# Patient Record
Sex: Male | Born: 1940 | Race: White | Hispanic: No | Marital: Single | Smoking: Never smoker
Health system: Southern US, Community
[De-identification: ages and names within clinical notes are randomized; demographics above are authoritative.]

## PROBLEM LIST (undated history)

## (undated) DIAGNOSIS — I10 Essential (primary) hypertension: Secondary | ICD-10-CM

## (undated) DIAGNOSIS — R7303 Prediabetes: Secondary | ICD-10-CM

## (undated) DIAGNOSIS — I499 Cardiac arrhythmia, unspecified: Secondary | ICD-10-CM

## (undated) DIAGNOSIS — I482 Chronic atrial fibrillation, unspecified: Secondary | ICD-10-CM

## (undated) DIAGNOSIS — J189 Pneumonia, unspecified organism: Secondary | ICD-10-CM

## (undated) DIAGNOSIS — Z7901 Long term (current) use of anticoagulants: Secondary | ICD-10-CM

## (undated) DIAGNOSIS — C61 Malignant neoplasm of prostate: Secondary | ICD-10-CM

## (undated) DIAGNOSIS — N184 Chronic kidney disease, stage 4 (severe): Secondary | ICD-10-CM

## (undated) DIAGNOSIS — R0602 Shortness of breath: Secondary | ICD-10-CM

## (undated) DIAGNOSIS — I509 Heart failure, unspecified: Secondary | ICD-10-CM

## (undated) DIAGNOSIS — E039 Hypothyroidism, unspecified: Secondary | ICD-10-CM

## (undated) DIAGNOSIS — M1612 Unilateral primary osteoarthritis, left hip: Secondary | ICD-10-CM

## (undated) DIAGNOSIS — I429 Cardiomyopathy, unspecified: Secondary | ICD-10-CM

## (undated) HISTORY — PX: PROSTATE SURGERY: SHX751

## (undated) HISTORY — DX: Long term (current) use of anticoagulants: Z79.01

## (undated) HISTORY — PX: PROSTATECTOMY: SHX69

## (undated) HISTORY — PX: CARDIOVERSION: SHX1299

## (undated) HISTORY — DX: Malignant neoplasm of prostate: C61

## (undated) HISTORY — DX: Essential (primary) hypertension: I10

## (undated) HISTORY — DX: Chronic atrial fibrillation, unspecified: I48.20

## (undated) HISTORY — DX: Cardiomyopathy, unspecified: I42.9

---

## 2000-04-30 ENCOUNTER — Encounter: Admission: RE | Admit: 2000-04-30 | Discharge: 2000-07-29 | Payer: Self-pay | Admitting: Radiation Oncology

## 2000-05-01 ENCOUNTER — Ambulatory Visit (HOSPITAL_COMMUNITY): Admission: RE | Admit: 2000-05-01 | Discharge: 2000-05-01 | Payer: Self-pay | Admitting: Internal Medicine

## 2005-03-11 ENCOUNTER — Emergency Department (HOSPITAL_COMMUNITY): Admission: EM | Admit: 2005-03-11 | Discharge: 2005-03-11 | Payer: Self-pay | Admitting: Emergency Medicine

## 2005-03-23 ENCOUNTER — Emergency Department (HOSPITAL_COMMUNITY): Admission: EM | Admit: 2005-03-23 | Discharge: 2005-03-23 | Payer: Self-pay | Admitting: Emergency Medicine

## 2009-02-03 ENCOUNTER — Encounter (INDEPENDENT_AMBULATORY_CARE_PROVIDER_SITE_OTHER): Payer: Self-pay | Admitting: Internal Medicine

## 2009-02-03 ENCOUNTER — Inpatient Hospital Stay (HOSPITAL_COMMUNITY): Admission: EM | Admit: 2009-02-03 | Discharge: 2009-02-08 | Payer: Self-pay | Admitting: Emergency Medicine

## 2009-02-03 ENCOUNTER — Ambulatory Visit: Payer: Self-pay | Admitting: Cardiology

## 2009-02-09 HISTORY — PX: NM MYOCAR PERF WALL MOTION: HXRAD629

## 2009-08-11 ENCOUNTER — Ambulatory Visit (HOSPITAL_COMMUNITY): Admission: RE | Admit: 2009-08-11 | Discharge: 2009-08-11 | Payer: Self-pay | Admitting: Cardiology

## 2009-08-11 ENCOUNTER — Encounter (INDEPENDENT_AMBULATORY_CARE_PROVIDER_SITE_OTHER): Payer: Self-pay | Admitting: Cardiology

## 2010-03-25 LAB — BASIC METABOLIC PANEL
BUN: 27 mg/dL — ABNORMAL HIGH (ref 6–23)
Calcium: 8.6 mg/dL (ref 8.4–10.5)
Creatinine, Ser: 1.88 mg/dL — ABNORMAL HIGH (ref 0.4–1.5)
GFR calc Af Amer: 43 mL/min — ABNORMAL LOW (ref >60)

## 2010-03-28 LAB — DIFFERENTIAL
Basophils Absolute: 0.1 10*3/uL (ref 0.0–0.1)
Basophils Relative: 1 % (ref 0–1)
Basophils Relative: 1 % (ref 0–1)
Basophils Relative: 2 % — ABNORMAL HIGH (ref 0–1)
Eosinophils Absolute: 0.1 10*3/uL (ref 0.0–0.7)
Eosinophils Absolute: 0.2 10*3/uL (ref 0.0–0.7)
Eosinophils Absolute: 0.3 10*3/uL (ref 0.0–0.7)
Eosinophils Relative: 4 % (ref 0–5)
Lymphocytes Relative: 25 % (ref 12–46)
Lymphocytes Relative: 40 % (ref 12–46)
Lymphs Abs: 2.1 10*3/uL (ref 0.7–4.0)
Lymphs Abs: 2.2 10*3/uL (ref 0.7–4.0)
Monocytes Absolute: 0.6 10*3/uL (ref 0.1–1.0)
Monocytes Absolute: 0.6 10*3/uL (ref 0.1–1.0)
Monocytes Relative: 11 % (ref 3–12)
Monocytes Relative: 9 % (ref 3–12)
Neutrophils Relative %: 44 % (ref 43–77)
Neutrophils Relative %: 46 % (ref 43–77)
Neutrophils Relative %: 63 % (ref 43–77)

## 2010-03-28 LAB — HEPARIN LEVEL (UNFRACTIONATED)
Heparin Unfractionated: 0.11 IU/mL — ABNORMAL LOW (ref 0.30–0.70)
Heparin Unfractionated: 0.54 IU/mL (ref 0.30–0.70)
Heparin Unfractionated: 1.5 IU/mL — ABNORMAL HIGH (ref 0.30–0.70)
Heparin Unfractionated: 1.52 IU/mL — ABNORMAL HIGH (ref 0.30–0.70)

## 2010-03-28 LAB — BASIC METABOLIC PANEL
BUN: 18 mg/dL (ref 6–23)
BUN: 18 mg/dL (ref 6–23)
BUN: 19 mg/dL (ref 6–23)
BUN: 19 mg/dL (ref 6–23)
CO2: 23 mEq/L (ref 19–32)
Calcium: 8.6 mg/dL (ref 8.4–10.5)
Calcium: 8.6 mg/dL (ref 8.4–10.5)
Chloride: 105 mEq/L (ref 96–112)
Chloride: 105 mEq/L (ref 96–112)
Chloride: 108 mEq/L (ref 96–112)
Creatinine, Ser: 1.87 mg/dL — ABNORMAL HIGH (ref 0.4–1.5)
Creatinine, Ser: 1.89 mg/dL — ABNORMAL HIGH (ref 0.4–1.5)
Creatinine, Ser: 2.13 mg/dL — ABNORMAL HIGH (ref 0.4–1.5)
GFR calc Af Amer: 38 mL/min — ABNORMAL LOW (ref >60)
GFR calc non Af Amer: 36 mL/min — ABNORMAL LOW (ref >60)
GFR calc non Af Amer: 36 mL/min — ABNORMAL LOW (ref >60)
Glucose, Bld: 98 mg/dL (ref 70–99)
Potassium: 4 mEq/L (ref 3.5–5.1)
Potassium: 4 mEq/L (ref 3.5–5.1)
Potassium: 4 mEq/L (ref 3.5–5.1)
Potassium: 4.3 mEq/L (ref 3.5–5.1)
Sodium: 137 mEq/L (ref 135–145)
Sodium: 140 mEq/L (ref 135–145)
Sodium: 140 mEq/L (ref 135–145)

## 2010-03-28 LAB — PROTEIN ELECTROPH W RFLX QUANT IMMUNOGLOBULINS
Albumin ELP: 52.6 % — ABNORMAL LOW (ref 55.8–66.1)
Alpha-1-Globulin: 5.5 % — ABNORMAL HIGH (ref 2.9–4.9)
Alpha-2-Globulin: 12.7 % — ABNORMAL HIGH (ref 7.1–11.8)
Beta 2: 6.7 % — ABNORMAL HIGH (ref 3.2–6.5)
Beta Globulin: 7 % (ref 4.7–7.2)

## 2010-03-28 LAB — UIFE/LIGHT CHAINS/TP QN, 24-HR UR
Albumin, U: DETECTED
Alpha 1, Urine: DETECTED — AB
Alpha 2, Urine: DETECTED — AB
Beta, Urine: DETECTED — AB
Free Lambda Lt Chains,Ur: 0.39 mg/dL (ref 0.08–1.01)
Total Protein, Urine: 6.3 mg/dL

## 2010-03-28 LAB — COMPREHENSIVE METABOLIC PANEL
ALT: 30 U/L (ref 0–53)
Albumin: 3.4 g/dL — ABNORMAL LOW (ref 3.5–5.2)
Alkaline Phosphatase: 49 U/L (ref 39–117)
Chloride: 107 mEq/L (ref 96–112)
Glucose, Bld: 92 mg/dL (ref 70–99)
Potassium: 4.6 mEq/L (ref 3.5–5.1)
Sodium: 139 mEq/L (ref 135–145)
Total Bilirubin: 0.7 mg/dL (ref 0.3–1.2)
Total Protein: 6.5 g/dL (ref 6.0–8.3)

## 2010-03-28 LAB — PHOSPHORUS: Phosphorus: 4.2 mg/dL (ref 2.3–4.6)

## 2010-03-28 LAB — CBC
HCT: 33.6 % — ABNORMAL LOW (ref 39.0–52.0)
HCT: 35.7 % — ABNORMAL LOW (ref 39.0–52.0)
Hemoglobin: 12 g/dL — ABNORMAL LOW (ref 13.0–17.0)
MCHC: 33.9 g/dL (ref 30.0–36.0)
MCV: 92.5 fL (ref 78.0–100.0)
MCV: 92.6 fL (ref 78.0–100.0)
Platelets: 209 10*3/uL (ref 150–400)
Platelets: 219 10*3/uL (ref 150–400)
RBC: 3.63 MIL/uL — ABNORMAL LOW (ref 4.22–5.81)
RDW: 13.8 % (ref 11.5–15.5)
RDW: 14 % (ref 11.5–15.5)
RDW: 14.1 % (ref 11.5–15.5)
WBC: 5.6 10*3/uL (ref 4.0–10.5)
WBC: 5.7 10*3/uL (ref 4.0–10.5)
WBC: 5.8 10*3/uL (ref 4.0–10.5)

## 2010-03-28 LAB — POCT CARDIAC MARKERS
CKMB, poc: 3.2 ng/mL (ref 1.0–8.0)
Myoglobin, poc: 206 ng/mL (ref 12–200)
Troponin i, poc: <0.05 ng/mL (ref 0.00–0.09)

## 2010-03-28 LAB — URINALYSIS, ROUTINE W REFLEX MICROSCOPIC
Bilirubin Urine: NEGATIVE
Glucose, UA: NEGATIVE mg/dL
Hgb urine dipstick: NEGATIVE
Urobilinogen, UA: 0.2 mg/dL (ref 0.0–1.0)

## 2010-03-28 LAB — PROTIME-INR
INR: 1.46 (ref 0.00–1.49)
Prothrombin Time: 14.4 seconds (ref 11.6–15.2)
Prothrombin Time: 26.8 seconds — ABNORMAL HIGH (ref 11.6–15.2)

## 2010-03-28 LAB — CARDIAC PANEL(CRET KIN+CKTOT+MB+TROPI)
Relative Index: 2.8 — ABNORMAL HIGH (ref 0.0–2.5)
Total CK: 123 U/L (ref 7–232)
Troponin I: 0.01 ng/mL (ref 0.00–0.06)

## 2010-03-28 LAB — MAGNESIUM
Magnesium: 1.9 mg/dL (ref 1.5–2.5)
Magnesium: 2 mg/dL (ref 1.5–2.5)

## 2010-03-28 LAB — HEPATIC FUNCTION PANEL
AST: 61 U/L — ABNORMAL HIGH (ref 0–37)
Bilirubin, Direct: 0.2 mg/dL (ref 0.0–0.3)
Total Bilirubin: 0.9 mg/dL (ref 0.3–1.2)

## 2010-03-28 LAB — APTT: aPTT: 30 seconds (ref 24–37)

## 2010-03-28 LAB — D-DIMER, QUANTITATIVE: D-Dimer, Quant: 1.51 ug/mL-FEU — ABNORMAL HIGH (ref 0.00–0.48)

## 2010-03-28 LAB — ALBUMIN: Albumin: 3.4 g/dL — ABNORMAL LOW (ref 3.5–5.2)

## 2010-03-28 LAB — IMMUNOFIXATION ELECTROPHORESIS: Total Protein ELP: 6.2 g/dL (ref 6.0–8.3)

## 2010-12-29 NOTE — H&P (Signed)
NTS SOAP Note  Vital Signs:  Vitals as of: [id number]: Systolic Q000111Q: Diastolic 94: Heart Rate 99: Temp 96.1F: Height 39ft 2.5in: Weight 246Lbs 0 Ounces: Pain Level 0: BMI 31  BMI : 31.16 kg/m2  Subjective: This 70 Years 70 Months old Male presents forscreening TCS.Denies any GI complaints.  Last had a TCS many years ago.  No family h/o colon carcinoma.  Review of Symptoms:  Constitutional:unremarkable Head:unremarkable Eyes:unremarkable Nose/Mouth/Throat:unremarkable Cardiovascular:unremarkable Respiratory:unremarkable Gastrointestinal:unremarkable Genitourinary:unremarkable Musculoskeletal:unremarkable Skin:unremarkable Hematolgic/Lymphatic:unremarkable Allergic/Immunologic:unremarkable   Past Medical History:Reviewed   Past Medical History  Surgical History: prostate 2002 Medical Problems:  Cancer-prostate, Heart problems, High Blood pressure, High cholesterol, hypothyroidism Allergies: nkda Medications: coumadin, synthroid, carvedilol, losartin, furosemide   Social History:Reviewed   Social History  Preferred Language: English ([place]) Race:  White Ethnicity: Not Hispanic / Latino Age: 70 Years 10 Months Marital Status:  S Alcohol:  No Recreational drug(s):  No   Smoking Status: Never smoker reviewed on 12/29/2010  Family History:Reviewed   Family History  Is there a family history [id number]    Objective Information: General:Well appearing, well nourished in no distress. Head:Atraumatic; no masses; no abnormalities Irregular rate and rhythym, no s3, s4, murmurs Abdomen:Soft, NT/ND, normal bowel sounds, no HSM, no masses.  No peritoneal signs. deferred to procedure  Assessment:Need for screening TCS  Diagnosis &amp; Procedure: DiagnosisCode: V76.51, ProcedureCodeRL:[id number],    Plan:Scheduled for TCS on 01/17/11.  Will stop Coumadin four days prior to procedure.   Patient  Education:Alternative treatments to surgery were discussed with patient (and family).Risks and benefits  of procedure were fully explained to the patient (and family) who gave informed consent. Patient/family questions were addressed.  Follow-up:Pending Surgery

## 2011-01-16 MED ORDER — SODIUM CHLORIDE 0.45 % IV SOLN
Freq: Once | INTRAVENOUS | Status: AC
  Administered 2011-01-17: 08:00:00 via INTRAVENOUS

## 2011-01-17 ENCOUNTER — Encounter (HOSPITAL_COMMUNITY)
Admission: RE | Disposition: A | Discharge status: Home / Self Care | Payer: Self-pay | Source: Ambulatory Visit | Attending: General Surgery

## 2011-01-17 ENCOUNTER — Ambulatory Visit (HOSPITAL_COMMUNITY)
Admission: RE | Admit: 2011-01-17 | Discharge: 2011-01-17 | Disposition: A | Discharge status: Home / Self Care | Payer: Medicare Other | Source: Ambulatory Visit | Attending: General Surgery | Admitting: General Surgery

## 2011-01-17 ENCOUNTER — Encounter (HOSPITAL_COMMUNITY): Payer: Self-pay

## 2011-01-17 DIAGNOSIS — Z7901 Long term (current) use of anticoagulants: Secondary | ICD-10-CM | POA: Insufficient documentation

## 2011-01-17 DIAGNOSIS — K5730 Diverticulosis of large intestine without perforation or abscess without bleeding: Secondary | ICD-10-CM | POA: Insufficient documentation

## 2011-01-17 DIAGNOSIS — Z1211 Encounter for screening for malignant neoplasm of colon: Secondary | ICD-10-CM | POA: Insufficient documentation

## 2011-01-17 DIAGNOSIS — I10 Essential (primary) hypertension: Secondary | ICD-10-CM | POA: Insufficient documentation

## 2011-01-17 DIAGNOSIS — E7800 Pure hypercholesterolemia, unspecified: Secondary | ICD-10-CM | POA: Insufficient documentation

## 2011-01-17 HISTORY — PX: COLONOSCOPY: SHX5424

## 2011-01-17 HISTORY — DX: Cardiac arrhythmia, unspecified: I49.9

## 2011-01-17 HISTORY — DX: Pneumonia, unspecified organism: J18.9

## 2011-01-17 SURGERY — COLONOSCOPY
Anesthesia: Moderate Sedation

## 2011-01-17 MED ORDER — MIDAZOLAM HCL 5 MG/5ML IJ SOLN
INTRAMUSCULAR | Status: DC | PRN
  Administered 2011-01-17 (×2): 2 mg via INTRAVENOUS

## 2011-01-17 MED ORDER — MIDAZOLAM HCL 5 MG/5ML IJ SOLN
INTRAMUSCULAR | Status: AC
  Filled 2011-01-17: qty 10

## 2011-01-17 MED ORDER — MEPERIDINE HCL 100 MG/ML IJ SOLN
INTRAMUSCULAR | Status: AC
  Filled 2011-01-17: qty 2

## 2011-01-17 MED ORDER — MEPERIDINE HCL 25 MG/ML IJ SOLN
INTRAMUSCULAR | Status: DC | PRN
  Administered 2011-01-17: 50 mg via INTRAVENOUS

## 2011-01-17 NOTE — Op Note (Signed)
[facility] [address] [place], [place]  [address]  COLONOSCOPY PROCEDURE REPORT  PATIENT:  [name], [name]  MR#:  [id number] BIRTHDATE:  Dec 06, 1940, 70 yrs. old  GENDER:  male ENDOSCOPIST:  Aviva Signs, MD REF. BY:  [name], M.D. PROCEDURE DATE:  01/17/2011 PROCEDURE:  Diagnostic Colonoscopy ASA CLASS:  Class II INDICATIONS:  Screening MEDICATIONS:   Versed 4 mg IV, demerol 50 mg IV  DESCRIPTION OF PROCEDURE:   After the risks benefits and alternatives of the procedure were thoroughly explained, informed consent was obtained.  Digital rectal exam was performed and revealed no abnormalities.   The [id number] endoscope was introduced through the anus and advanced to the cecum, which was identified by the ileocecal valve, without limitations.  The quality of the prep was adequate..  The instrument was then slowly withdrawn as the colon was fully examined.  FINDINGS:  Scattered diverticula were found in the descending colon.   Retroflexed views in the rectum revealed no abnormalities.   The scope was then withdrawn from the cecum and the procedure completed. 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  ______________________________ Aviva Signs, MD  CC:  [name], MD  n. Lorrin MaisAviva Signs at 01/17/2011 08:59 AM  [name] [name], [id number]

## 2011-01-23 ENCOUNTER — Encounter (HOSPITAL_COMMUNITY): Payer: Self-pay | Admitting: General Surgery

## 2011-04-06 ENCOUNTER — Encounter (HOSPITAL_COMMUNITY): Payer: Self-pay

## 2011-04-10 HISTORY — PX: TRANSTHORACIC ECHOCARDIOGRAM: SHX275

## 2011-04-11 NOTE — Patient Instructions (Signed)
[place]  04/11/2011   Your procedure is scheduled on:  04/17/2011  Report to [facility] at  800  AM.  Call this number if you have problems the morning of surgery: [phone]   Remember:   Do not eat food:After Midnight.  May have clear liquids:until Midnight .  Clear liquids include soda, tea, black coffee, apple or grape juice, broth.  Take these medicines the morning of surgery with A SIP OF WATER: coreg,synthroid,cozaar   Do not wear jewelry, make-up or nail polish.  Do not wear lotions, powders, or perfumes. You may wear deodorant.  Do not shave 48 hours prior to surgery.  Do not bring valuables to the hospital.  Contacts, dentures or bridgework may not be worn into surgery.  Leave suitcase in the car. After surgery it may be brought to your room.  For patients admitted to the hospital, checkout time is 11:00 AM the day of discharge.   Patients discharged the day of surgery will not be allowed to drive home.  Name and phone number of your driver: family  Special Instructions: N/A   Please read over the following fact sheets that you were given: Pain Booklet, Surgical Site Infection Prevention, Anesthesia Post-op Instructions and Care and Recovery After Surgery Cataract A cataract is a clouding of the lens of the eye. When a lens becomes cloudy, vision is reduced based on the degree and nature of the clouding. Many cataracts reduce vision to some degree. Some cataracts make people more near-sighted as they develop. Other cataracts increase glare. Cataracts that are ignored and become worse can sometimes look white. The white color can be seen through the pupil. CAUSES   Aging. However, cataracts may occur at any age, even in newborns.   Certain drugs.   Trauma to the eye.   Certain diseases such as diabetes.   Specific eye diseases such as chronic inflammation inside the eye or a sudden attack of a rare form of glaucoma.   Inherited or acquired medical problems.    SYMPTOMS   Gradual, progressive drop in vision in the affected eye.   Severe, rapid visual loss. This most often happens when trauma is the cause.  DIAGNOSIS  To detect a cataract, an eye doctor examines the lens. Cataracts are best diagnosed with an exam of the eyes with the pupils enlarged (dilated) by drops.  TREATMENT  For an early cataract, vision may improve by using different eyeglasses or stronger lighting. If that does not help your vision, surgery is the only effective treatment. A cataract needs to be surgically removed when vision loss interferes with your everyday activities, such as driving, reading, or watching TV. A cataract may also have to be removed if it prevents examination or treatment of another eye problem. Surgery removes the cloudy lens and usually replaces it with a substitute lens (intraocular lens, IOL).  At a time when both you and your doctor agree, the cataract will be surgically removed. If you have cataracts in both eyes, only one is usually removed at a time. This allows the operated eye to heal and be out of danger from any possible problems after surgery (such as infection or poor wound healing). In rare cases, a cataract may be doing damage to your eye. In these cases, your caregiver may advise surgical removal right away. The vast majority of people who have cataract surgery have better vision afterward. HOME CARE INSTRUCTIONS  If you are not planning surgery, you may be  asked to do the following:  Use different eyeglasses.   Use stronger or brighter lighting.   Ask your eye doctor about reducing your medicine dose or changing medicines if it is thought that a medicine caused your cataract. Changing medicines does not make the cataract go away on its own.   Become familiar with your surroundings. Poor vision can lead to injury. Avoid bumping into things on the affected side. You are at a higher risk for tripping or falling.   Exercise extreme care when  driving or operating machinery.   Wear sunglasses if you are sensitive to bright light or experiencing problems with glare.  SEEK IMMEDIATE MEDICAL CARE IF:   You have a worsening or sudden vision loss.   You notice redness, swelling, or increasing pain in the eye.   You have a fever.  Document Released: 12/26/2004 Document Revised: 12/15/2010 Document Reviewed: 08/19/2010 [facility] Patient Information 2012 [place].PATIENT INSTRUCTIONS POST-ANESTHESIA  IMMEDIATELY FOLLOWING SURGERY:  Do not drive or operate machinery for the first twenty four hours after surgery.  Do not make any important decisions for twenty four hours after surgery or while taking narcotic pain medications or sedatives.  If you develop intractable nausea and vomiting or a severe headache please notify your doctor immediately.  FOLLOW-UP:  Please make an appointment with your surgeon as instructed. You do not need to follow up with anesthesia unless specifically instructed to do so.  WOUND CARE INSTRUCTIONS (if applicable):  Keep a dry clean dressing on the anesthesia/puncture wound site if there is drainage.  Once the wound has quit draining you may leave it open to air.  Generally you should leave the bandage intact for twenty four hours unless there is drainage.  If the epidural site drains for more than 36-48 hours please call the anesthesia department.  QUESTIONS?:  Please feel free to call your physician or the hospital operator if you have any questions, and they will be happy to assist you.     [place] [phone]

## 2011-04-12 ENCOUNTER — Encounter (HOSPITAL_COMMUNITY): Payer: Self-pay

## 2011-04-12 ENCOUNTER — Encounter (HOSPITAL_COMMUNITY)
Admission: RE | Admit: 2011-04-12 | Discharge: 2011-04-12 | Disposition: A | Discharge status: Home / Self Care | Payer: Medicare Other | Source: Ambulatory Visit | Attending: Ophthalmology | Admitting: Ophthalmology

## 2011-04-12 HISTORY — DX: Hypothyroidism, unspecified: E03.9

## 2011-04-12 HISTORY — DX: Shortness of breath: R06.02

## 2011-04-12 LAB — BASIC METABOLIC PANEL
Chloride: 104 mEq/L (ref 96–112)
GFR calc Af Amer: 41 mL/min — ABNORMAL LOW (ref >90)
Potassium: 4.6 mEq/L (ref 3.5–5.1)
Sodium: 139 mEq/L (ref 135–145)

## 2011-04-12 LAB — HEMOGLOBIN AND HEMATOCRIT, BLOOD
HCT: 39.3 % (ref 39.0–52.0)
Hemoglobin: 13.6 g/dL (ref 13.0–17.0)

## 2011-04-14 MED ORDER — LIDOCAINE HCL (PF) 1 % IJ SOLN
INTRAMUSCULAR | Status: AC
  Filled 2011-04-14: qty 2

## 2011-04-14 MED ORDER — CYCLOPENTOLATE-PHENYLEPHRINE 0.2-1 % OP SOLN
OPHTHALMIC | Status: AC
  Filled 2011-04-14: qty 2

## 2011-04-14 MED ORDER — NEOMYCIN-POLYMYXIN-DEXAMETH 3.5-10000-0.1 OP OINT
TOPICAL_OINTMENT | OPHTHALMIC | Status: AC
  Filled 2011-04-14: qty 3.5

## 2011-04-14 MED ORDER — TETRACAINE HCL 0.5 % OP SOLN
OPHTHALMIC | Status: AC
  Filled 2011-04-14: qty 2

## 2011-04-14 MED ORDER — PHENYLEPHRINE HCL 2.5 % OP SOLN
OPHTHALMIC | Status: AC
  Filled 2011-04-14: qty 2

## 2011-04-14 MED ORDER — LIDOCAINE HCL 3.5 % OP GEL
OPHTHALMIC | Status: AC
  Filled 2011-04-14: qty 5

## 2011-04-17 ENCOUNTER — Ambulatory Visit (HOSPITAL_COMMUNITY): Payer: Medicare Other | Admitting: Anesthesiology

## 2011-04-17 ENCOUNTER — Encounter (HOSPITAL_COMMUNITY): Payer: Self-pay | Admitting: *Deleted

## 2011-04-17 ENCOUNTER — Encounter (HOSPITAL_COMMUNITY): Payer: Self-pay | Admitting: Anesthesiology

## 2011-04-17 ENCOUNTER — Encounter (HOSPITAL_COMMUNITY)
Admission: RE | Disposition: A | Discharge status: Home / Self Care | Payer: Self-pay | Source: Ambulatory Visit | Attending: Ophthalmology

## 2011-04-17 ENCOUNTER — Ambulatory Visit (HOSPITAL_COMMUNITY)
Admission: RE | Admit: 2011-04-17 | Discharge: 2011-04-17 | Disposition: A | Discharge status: Home / Self Care | Payer: Medicare Other | Source: Ambulatory Visit | Attending: Ophthalmology | Admitting: Ophthalmology

## 2011-04-17 DIAGNOSIS — E039 Hypothyroidism, unspecified: Secondary | ICD-10-CM | POA: Insufficient documentation

## 2011-04-17 DIAGNOSIS — Z01810 Encounter for preprocedural cardiovascular examination: Secondary | ICD-10-CM | POA: Insufficient documentation

## 2011-04-17 DIAGNOSIS — H2589 Other age-related cataract: Secondary | ICD-10-CM | POA: Insufficient documentation

## 2011-04-17 DIAGNOSIS — I10 Essential (primary) hypertension: Secondary | ICD-10-CM | POA: Insufficient documentation

## 2011-04-17 DIAGNOSIS — Z01812 Encounter for preprocedural laboratory examination: Secondary | ICD-10-CM | POA: Insufficient documentation

## 2011-04-17 HISTORY — PX: CATARACT EXTRACTION W/PHACO: SHX586

## 2011-04-17 SURGERY — PHACOEMULSIFICATION, CATARACT, WITH IOL INSERTION
Anesthesia: Monitor Anesthesia Care | Site: Eye | Laterality: Left | Wound class: Clean

## 2011-04-17 MED ORDER — ONDANSETRON HCL 4 MG/2ML IJ SOLN: 4.0000 mg | Freq: Once | INTRAMUSCULAR | Status: DC | PRN

## 2011-04-17 MED ORDER — MIDAZOLAM HCL 2 MG/2ML IJ SOLN
1.0000 mg | INTRAMUSCULAR | Status: DC | PRN
  Administered 2011-04-17: 2 mg via INTRAVENOUS

## 2011-04-17 MED ORDER — LIDOCAINE 3.5 % OP GEL OPTIME - NO CHARGE
OPHTHALMIC | Status: DC | PRN
  Administered 2011-04-17: 1 [drp] via OPHTHALMIC

## 2011-04-17 MED ORDER — BSS IO SOLN
INTRAOCULAR | Status: DC | PRN
  Administered 2011-04-17: 15 mL via INTRAOCULAR

## 2011-04-17 MED ORDER — MIDAZOLAM HCL 2 MG/2ML IJ SOLN
INTRAMUSCULAR | Status: AC
  Administered 2011-04-17: 2 mg via INTRAVENOUS
  Filled 2011-04-17: qty 2

## 2011-04-17 MED ORDER — POVIDONE-IODINE 5 % OP SOLN
OPHTHALMIC | Status: DC | PRN
  Administered 2011-04-17: 1 via OPHTHALMIC

## 2011-04-17 MED ORDER — LIDOCAINE HCL (PF) 1 % IJ SOLN
INTRAMUSCULAR | Status: DC | PRN
  Administered 2011-04-17: .4 mL

## 2011-04-17 MED ORDER — CYCLOPENTOLATE-PHENYLEPHRINE 0.2-1 % OP SOLN
1.0000 [drp] | OPHTHALMIC | Status: AC
  Administered 2011-04-17 (×3): 1 [drp] via OPHTHALMIC

## 2011-04-17 MED ORDER — LACTATED RINGERS IV SOLN
INTRAVENOUS | Status: DC
  Administered 2011-04-17: 1000 mL via INTRAVENOUS

## 2011-04-17 MED ORDER — LIDOCAINE HCL 3.5 % OP GEL
1.0000 "application " | Freq: Once | OPHTHALMIC | Status: AC
  Administered 2011-04-17: 1 via OPHTHALMIC

## 2011-04-17 MED ORDER — FENTANYL CITRATE 0.05 MG/ML IJ SOLN: 25.0000 ug | INTRAMUSCULAR | Status: DC | PRN

## 2011-04-17 MED ORDER — PROVISC 10 MG/ML IO SOLN
INTRAOCULAR | Status: DC | PRN
  Administered 2011-04-17: 8.5 mg via INTRAOCULAR

## 2011-04-17 MED ORDER — EPINEPHRINE HCL 1 MG/ML IJ SOLN
INTRAOCULAR | Status: DC | PRN
  Administered 2011-04-17: 10:00:00

## 2011-04-17 MED ORDER — TETRACAINE HCL 0.5 % OP SOLN
1.0000 [drp] | OPHTHALMIC | Status: AC
  Administered 2011-04-17 (×3): 1 [drp] via OPHTHALMIC

## 2011-04-17 MED ORDER — EPINEPHRINE HCL 1 MG/ML IJ SOLN
INTRAMUSCULAR | Status: AC
  Filled 2011-04-17: qty 1

## 2011-04-17 MED ORDER — PHENYLEPHRINE HCL 2.5 % OP SOLN
1.0000 [drp] | OPHTHALMIC | Status: AC
  Administered 2011-04-17 (×3): 1 [drp] via OPHTHALMIC

## 2011-04-17 SURGICAL SUPPLY — 31 items
CAPSULAR TENSION RING-AMO (OPHTHALMIC RELATED) IMPLANT
CLOTH BEACON ORANGE TIMEOUT ST (SAFETY) ×1 IMPLANT
EYE SHIELD UNIVERSAL CLEAR (GAUZE/BANDAGES/DRESSINGS) ×2 IMPLANT
GLOVE BIO SURGEON STRL SZ 6.5 (GLOVE) IMPLANT
GLOVE BIOGEL PI IND STRL 6.5 (GLOVE) IMPLANT
GLOVE BIOGEL PI IND STRL 7.0 (GLOVE) ×1 IMPLANT
GLOVE BIOGEL PI IND STRL 7.5 (GLOVE) IMPLANT
GLOVE BIOGEL PI INDICATOR 6.5 (GLOVE)
GLOVE BIOGEL PI INDICATOR 7.0 (GLOVE) ×1
GLOVE BIOGEL PI INDICATOR 7.5 (GLOVE)
GLOVE ECLIPSE 6.5 STRL STRAW (GLOVE) IMPLANT
GLOVE ECLIPSE 7.0 STRL STRAW (GLOVE) IMPLANT
GLOVE ECLIPSE 7.5 STRL STRAW (GLOVE) IMPLANT
GLOVE EXAM NITRILE LRG STRL (GLOVE) IMPLANT
GLOVE EXAM NITRILE MD LF STRL (GLOVE) ×1 IMPLANT
GLOVE SKINSENSE NS SZ6.5 (GLOVE)
GLOVE SKINSENSE NS SZ7.0 (GLOVE)
GLOVE SKINSENSE STRL SZ6.5 (GLOVE) IMPLANT
GLOVE SKINSENSE STRL SZ7.0 (GLOVE) IMPLANT
KIT VITRECTOMY (OPHTHALMIC RELATED) IMPLANT
PAD ARMBOARD 7.5X6 YLW CONV (MISCELLANEOUS) ×2 IMPLANT
PROC W NO LENS (INTRAOCULAR LENS)
PROC W SPEC LENS (INTRAOCULAR LENS)
PROCESS W NO LENS (INTRAOCULAR LENS) IMPLANT
PROCESS W SPEC LENS (INTRAOCULAR LENS) IMPLANT
RING MALYGIN (MISCELLANEOUS) IMPLANT
SIGHTPATH CAT PROC W REG LENS (Ophthalmic Related) ×2 IMPLANT
SYR TB 1ML LL NO SAFETY (SYRINGE) ×2 IMPLANT
TAPE CLOTH SOFT 2X10 (GAUZE/BANDAGES/DRESSINGS) ×2 IMPLANT
VISCOELASTIC ADDITIONAL (OPHTHALMIC RELATED) IMPLANT
WATER STERILE IRR 250ML POUR (IV SOLUTION) ×2 IMPLANT

## 2011-04-17 NOTE — Anesthesia Preprocedure Evaluation (Signed)
Anesthesia Evaluation  Patient identified by MRN, date of birth, ID band Patient awake    Reviewed: Allergy & Precautions, H&P , NPO status , Patient's Chart, lab work & pertinent test results, reviewed documented beta blocker date and time   Airway Mallampati: III      Dental  (+) Edentulous Upper and Edentulous Lower   Pulmonary shortness of breath, pneumonia ,  breath sounds clear to auscultation        Cardiovascular hypertension, + CAD + dysrhythmias Atrial Fibrillation Rhythm:Irregular Rate:Normal     Neuro/Psych    GI/Hepatic   Endo/Other  Hypothyroidism   Renal/GU      Musculoskeletal   Abdominal   Peds  Hematology   Anesthesia Other Findings   Reproductive/Obstetrics                           Anesthesia Physical Anesthesia Plan  ASA: III  Anesthesia Plan: MAC   Post-op Pain Management:    Induction: Intravenous  Airway Management Planned: Nasal Cannula  Additional Equipment:   Intra-op Plan:   Post-operative Plan:   Informed Consent: I have reviewed the patients History and Physical, chart, labs and discussed the procedure including the risks, benefits and alternatives for the proposed anesthesia with the patient or authorized representative who has indicated his/her understanding and acceptance.     Plan Discussed with:   Anesthesia Plan Comments:         Anesthesia Quick Evaluation

## 2011-04-17 NOTE — H&P (Signed)
I have reviewed the H&P, the patient was re-examined, and I have identified no interval changes in medical condition and plan of care since the history and physical of record  

## 2011-04-17 NOTE — Discharge Instructions (Signed)
[name] [name]  04/17/2011     Instructions  1. Use medications as Instructed.  Shake well before use. Wait 5 minutes between drops.  {OPHTHALMIC ANTIBIOTICS:22167} 4 times a day x 1 week.  {OPHTHALMIC ANTI-INFLAMMATORY:22168} 2 times a day x 4 weeks.  {OPHTHALMIC STEROID:22169} 4 times a day - week 1   3 times a day - Week 2, 2 times a day- Week 3, 1 time a day - Week 4.  2. Do not rub the operative eye. Do not swim underwater for 2 weeks.  3. You may remove the clear shield and resume your normal activities the day after  Surgery. Your eyes may feel more comfortable if you wear dark glasses outside.  4. Call our office at [phone] if you have sudden change in vision, extreme redness or pain. Some fluctuation in vision is normal after surgery. If you have an emergency after hours, call Dr. Geoffry Paradise at [phone].  5. It is important that you attend all of your follow-up appointments.        Follow-up:{follow up:32580} with [name], MD.   Dr. Loran Senters: [phone]  Dr. Iona HansenJI:[id number]  Dr. Geoffry ParadiseID:[id number]   If you find that you cannot contact your physician, but feel that your signs and   Symptoms warrant a physician's attention, call the Emergency Room at   [phone] ext.[phone].   Other{NA AND VA:[id number].

## 2011-04-17 NOTE — Brief Op Note (Signed)
Pre-Op Dx: Cataract OS Post-Op Dx: Cataract OS Surgeon: [name] Anesthesia: Topical with MAC Implant: Lenstec, Model Softec HD Specimen: None Complications: None

## 2011-04-17 NOTE — Transfer of Care (Signed)
Immediate Anesthesia Transfer of Care Note  Patient: [name] [name]  Procedure(s) Performed: Procedure(s) (LRB): CATARACT EXTRACTION PHACO AND INTRAOCULAR LENS PLACEMENT (IOC) (Left)  Patient Location: PACU and Short Stay  Anesthesia Type: MAC  Level of Consciousness: awake, alert , oriented and patient cooperative  Airway & Oxygen Therapy: Patient Spontanous Breathing  Post-op Assessment: Report given to PACU RN, Post -op Vital signs reviewed and stable and Patient moving all extremities  Post vital signs: Reviewed and stable  Complications: No apparent anesthesia complications

## 2011-04-17 NOTE — Op Note (Signed)
NAME:  [name] [name], BARQUIN NO.:  [id number]  MEDICAL RECORD NO.:  [id number]  LOCATION:  APPO                          FACILITY:  [facility]  PHYSICIAN:  Richardo Hanks, MD       DATE OF BIRTH:  [birth year]  DATE OF PROCEDURE:  04/17/2011 DATE OF DISCHARGE:  04/17/2011                              OPERATIVE REPORT   PREOPERATIVE DIAGNOSIS:  Combined cataract, left eye, diagnosis code 366.19.  POSTOPERATIVE DIAGNOSIS:  Combined cataract, left eye, diagnosis code 366.19.  SURGEON:  Franky Macho. Griselle Rufer, MD  ANESTHESIA:  Topical with monitored anesthesia care.  DESCRIPTION OF THE OPERATION:  In the preoperative holding area, dilating drop and viscous lidocaine were placed into the left eye.  The patient was then brought to the operating room where he was prepped and draped.  Beginning with a #75 blade, a paracentesis port was made at the surgeon's 2 o'clock position.  The anterior chamber was filled with a 1% nonpreserved lidocaine solution.  Because of poor visualization of the red reflex, the anterior chamber was filled with VisionBlue and the VisionBlue was then rinsed from the anterior chamber with balanced salt solution.  The anterior chamber was then filled with Provisc.  A 2.4-mm keratome blade was then used to make a clear corneal incision at the temporal limbus.  A bent cystotome needle was used to create a continuous tear capsulotomy.  Hydrodissection was performed with balanced salt solution and a fine cannula.  The lens nucleus was then removed using phacoemulsification and a quadrant cracking technique. Residual cortex was removed with irrigation and aspiration.  A capsular bag and anterior chamber were refilled with Provisc and a posterior chamber intraocular lens was placed into the capsular bag without difficulty using its lens injecting system.  The Provisc was then removed from the capsular bag and anterior chamber with irrigation and aspiration.  Stromal  hydration of the main incision and paracentesis ports was performed with balanced salt solution and a fine cannula.  The wounds were tested for leak, which were negative.  The patient tolerated the procedure well.  There were no operative complications and he was returned to the recovery area in satisfactory condition.  No surgical specimens.  Prosthetic device used is a Lenstec posterior chamber lens, model Softec HD, power of 13.5, serial number is [id number].          ______________________________ Richardo Hanks, MD     KEH/MEDQ  D:  04/17/2011  T:  04/17/2011  Job:  [id number]

## 2011-04-17 NOTE — Anesthesia Postprocedure Evaluation (Signed)
  Anesthesia Post-op Note  Patient: [name] [name]  Procedure(s) Performed: Procedure(s) (LRB): CATARACT EXTRACTION PHACO AND INTRAOCULAR LENS PLACEMENT (IOC) (Left)  Patient Location: PACU and Short Stay  Anesthesia Type: MAC  Level of Consciousness: awake, alert , oriented and patient cooperative  Airway and Oxygen Therapy: Patient Spontanous Breathing  Post-op Pain: none  Post-op Assessment: Post-op Vital signs reviewed, Patient's Cardiovascular Status Stable, Respiratory Function Stable, Patent Airway and No signs of Nausea or vomiting  Post-op Vital Signs: Reviewed and stable  Complications: No apparent anesthesia complications

## 2011-04-18 MED ORDER — NEOMYCIN-POLYMYXIN-DEXAMETH 0.1 % OP OINT
TOPICAL_OINTMENT | OPHTHALMIC | Status: AC | PRN
  Administered 2011-04-18: 1 via OPHTHALMIC

## 2011-04-19 ENCOUNTER — Encounter (HOSPITAL_COMMUNITY): Payer: Self-pay | Admitting: Ophthalmology

## 2011-05-10 HISTORY — PX: OTHER SURGICAL HISTORY: SHX169

## 2011-05-17 ENCOUNTER — Encounter (HOSPITAL_COMMUNITY): Payer: Self-pay | Admitting: Pharmacy Technician

## 2011-05-18 ENCOUNTER — Encounter (HOSPITAL_COMMUNITY): Payer: Self-pay

## 2011-05-18 ENCOUNTER — Encounter (HOSPITAL_COMMUNITY)
Admission: RE | Admit: 2011-05-18 | Discharge: 2011-05-18 | Payer: Medicare Other | Source: Ambulatory Visit | Admitting: Ophthalmology

## 2011-05-19 MED ORDER — NEOMYCIN-POLYMYXIN-DEXAMETH 3.5-10000-0.1 OP OINT
TOPICAL_OINTMENT | OPHTHALMIC | Status: AC
  Filled 2011-05-19: qty 3.5

## 2011-05-19 MED ORDER — LIDOCAINE HCL 3.5 % OP GEL
OPHTHALMIC | Status: AC
  Filled 2011-05-19: qty 5

## 2011-05-19 MED ORDER — PHENYLEPHRINE HCL 2.5 % OP SOLN
OPHTHALMIC | Status: AC
  Filled 2011-05-19: qty 2

## 2011-05-19 MED ORDER — LIDOCAINE HCL (PF) 1 % IJ SOLN
INTRAMUSCULAR | Status: AC
  Filled 2011-05-19: qty 2

## 2011-05-19 MED ORDER — CYCLOPENTOLATE-PHENYLEPHRINE 0.2-1 % OP SOLN
OPHTHALMIC | Status: AC
  Filled 2011-05-19: qty 2

## 2011-05-19 MED ORDER — TETRACAINE HCL 0.5 % OP SOLN
OPHTHALMIC | Status: AC
  Filled 2011-05-19: qty 2

## 2011-05-22 ENCOUNTER — Encounter (HOSPITAL_COMMUNITY): Payer: Self-pay | Admitting: Anesthesiology

## 2011-05-22 ENCOUNTER — Encounter (HOSPITAL_COMMUNITY): Payer: Self-pay | Admitting: *Deleted

## 2011-05-22 ENCOUNTER — Ambulatory Visit (HOSPITAL_COMMUNITY)
Admission: RE | Admit: 2011-05-22 | Discharge: 2011-05-22 | Disposition: A | Discharge status: Home / Self Care | Payer: Medicare Other | Source: Ambulatory Visit | Attending: Ophthalmology | Admitting: Ophthalmology

## 2011-05-22 ENCOUNTER — Ambulatory Visit (HOSPITAL_COMMUNITY): Payer: Medicare Other | Admitting: Anesthesiology

## 2011-05-22 ENCOUNTER — Encounter (HOSPITAL_COMMUNITY)
Admission: RE | Disposition: A | Discharge status: Home / Self Care | Payer: Self-pay | Source: Ambulatory Visit | Attending: Ophthalmology

## 2011-05-22 DIAGNOSIS — I4891 Unspecified atrial fibrillation: Secondary | ICD-10-CM | POA: Insufficient documentation

## 2011-05-22 DIAGNOSIS — H2589 Other age-related cataract: Secondary | ICD-10-CM | POA: Insufficient documentation

## 2011-05-22 DIAGNOSIS — I10 Essential (primary) hypertension: Secondary | ICD-10-CM | POA: Insufficient documentation

## 2011-05-22 DIAGNOSIS — Z7901 Long term (current) use of anticoagulants: Secondary | ICD-10-CM | POA: Insufficient documentation

## 2011-05-22 DIAGNOSIS — Z79899 Other long term (current) drug therapy: Secondary | ICD-10-CM | POA: Insufficient documentation

## 2011-05-22 HISTORY — PX: CATARACT EXTRACTION W/PHACO: SHX586

## 2011-05-22 SURGERY — PHACOEMULSIFICATION, CATARACT, WITH IOL INSERTION
Anesthesia: Monitor Anesthesia Care | Site: Eye | Laterality: Right | Wound class: Clean

## 2011-05-22 MED ORDER — TETRACAINE HCL 0.5 % OP SOLN
OPHTHALMIC | Status: AC
  Filled 2011-05-22: qty 2

## 2011-05-22 MED ORDER — LIDOCAINE HCL 3.5 % OP GEL
1.0000 "application " | Freq: Once | OPHTHALMIC | Status: AC
  Administered 2011-05-22: 1 via OPHTHALMIC

## 2011-05-22 MED ORDER — PHENYLEPHRINE HCL 2.5 % OP SOLN
OPHTHALMIC | Status: AC
  Filled 2011-05-22: qty 2

## 2011-05-22 MED ORDER — LACTATED RINGERS IV SOLN
INTRAVENOUS | Status: DC
  Administered 2011-05-22: 07:00:00 via INTRAVENOUS

## 2011-05-22 MED ORDER — PROVISC 10 MG/ML IO SOLN
INTRAOCULAR | Status: DC | PRN
  Administered 2011-05-22: 8.5 mg via INTRAOCULAR

## 2011-05-22 MED ORDER — CYCLOPENTOLATE-PHENYLEPHRINE 0.2-1 % OP SOLN
OPHTHALMIC | Status: AC
  Filled 2011-05-22: qty 2

## 2011-05-22 MED ORDER — MIDAZOLAM HCL 2 MG/2ML IJ SOLN
INTRAMUSCULAR | Status: AC
  Filled 2011-05-22: qty 2

## 2011-05-22 MED ORDER — EPINEPHRINE HCL 1 MG/ML IJ SOLN
INTRAMUSCULAR | Status: AC
  Filled 2011-05-22: qty 1

## 2011-05-22 MED ORDER — BSS IO SOLN
INTRAOCULAR | Status: DC | PRN
  Administered 2011-05-22: 15 mL via INTRAOCULAR

## 2011-05-22 MED ORDER — POVIDONE-IODINE 5 % OP SOLN
OPHTHALMIC | Status: DC | PRN
  Administered 2011-05-22: 1 via OPHTHALMIC

## 2011-05-22 MED ORDER — TETRACAINE HCL 0.5 % OP SOLN
1.0000 [drp] | OPHTHALMIC | Status: AC
  Administered 2011-05-22 (×3): 1 [drp] via OPHTHALMIC

## 2011-05-22 MED ORDER — FLURBIPROFEN SODIUM 0.03 % OP SOLN
OPHTHALMIC | Status: AC
  Filled 2011-05-22: qty 2.5

## 2011-05-22 MED ORDER — LIDOCAINE 3.5 % OP GEL OPTIME - NO CHARGE
OPHTHALMIC | Status: DC | PRN
  Administered 2011-05-22: 2 [drp] via OPHTHALMIC

## 2011-05-22 MED ORDER — NEOMYCIN-POLYMYXIN-DEXAMETH 0.1 % OP OINT
TOPICAL_OINTMENT | OPHTHALMIC | Status: DC | PRN
  Administered 2011-05-22: 1 via OPHTHALMIC

## 2011-05-22 MED ORDER — MIDAZOLAM HCL 2 MG/2ML IJ SOLN
1.0000 mg | INTRAMUSCULAR | Status: DC | PRN
  Administered 2011-05-22: 2 mg via INTRAVENOUS

## 2011-05-22 MED ORDER — EPINEPHRINE HCL 1 MG/ML IJ SOLN
INTRAOCULAR | Status: DC | PRN
  Administered 2011-05-22: 08:00:00

## 2011-05-22 MED ORDER — PHENYLEPHRINE HCL 2.5 % OP SOLN
1.0000 [drp] | OPHTHALMIC | Status: AC
  Administered 2011-05-22 (×3): 1 [drp] via OPHTHALMIC

## 2011-05-22 MED ORDER — LIDOCAINE HCL (PF) 1 % IJ SOLN
INTRAMUSCULAR | Status: DC | PRN
  Administered 2011-05-22: .5 mL

## 2011-05-22 MED ORDER — CYCLOPENTOLATE-PHENYLEPHRINE 0.2-1 % OP SOLN
1.0000 [drp] | OPHTHALMIC | Status: AC
  Administered 2011-05-22 (×3): 1 [drp] via OPHTHALMIC

## 2011-05-22 SURGICAL SUPPLY — 32 items
CAPSULAR TENSION RING-AMO (OPHTHALMIC RELATED) IMPLANT
CLOTH BEACON ORANGE TIMEOUT ST (SAFETY) ×2 IMPLANT
EYE SHIELD UNIVERSAL CLEAR (GAUZE/BANDAGES/DRESSINGS) ×2 IMPLANT
GLOVE BIO SURGEON STRL SZ 6.5 (GLOVE) IMPLANT
GLOVE BIOGEL PI IND STRL 6.5 (GLOVE) IMPLANT
GLOVE BIOGEL PI IND STRL 7.0 (GLOVE) IMPLANT
GLOVE BIOGEL PI IND STRL 7.5 (GLOVE) IMPLANT
GLOVE BIOGEL PI INDICATOR 6.5 (GLOVE)
GLOVE BIOGEL PI INDICATOR 7.0 (GLOVE) ×1
GLOVE BIOGEL PI INDICATOR 7.5 (GLOVE)
GLOVE ECLIPSE 6.5 STRL STRAW (GLOVE) IMPLANT
GLOVE ECLIPSE 7.0 STRL STRAW (GLOVE) IMPLANT
GLOVE ECLIPSE 7.5 STRL STRAW (GLOVE) IMPLANT
GLOVE EXAM NITRILE LRG STRL (GLOVE) IMPLANT
GLOVE EXAM NITRILE MD LF STRL (GLOVE) ×2 IMPLANT
GLOVE SKINSENSE NS SZ6.5 (GLOVE)
GLOVE SKINSENSE NS SZ7.0 (GLOVE)
GLOVE SKINSENSE STRL SZ6.5 (GLOVE) IMPLANT
GLOVE SKINSENSE STRL SZ7.0 (GLOVE) IMPLANT
KIT VITRECTOMY (OPHTHALMIC RELATED) IMPLANT
PAD ARMBOARD 7.5X6 YLW CONV (MISCELLANEOUS) ×2 IMPLANT
PROC W NO LENS (INTRAOCULAR LENS)
PROC W SPEC LENS (INTRAOCULAR LENS)
PROCESS W NO LENS (INTRAOCULAR LENS) IMPLANT
PROCESS W SPEC LENS (INTRAOCULAR LENS) IMPLANT
RING MALYGIN (MISCELLANEOUS) IMPLANT
SIGHTPATH CAT PROC W REG LENS (Ophthalmic Related) ×2 IMPLANT
SYR TB 1ML LL NO SAFETY (SYRINGE) IMPLANT
TAPE SURG TRANSPORE 1 IN (GAUZE/BANDAGES/DRESSINGS) ×1 IMPLANT
TAPE SURGICAL TRANSPORE 1 IN (GAUZE/BANDAGES/DRESSINGS) ×1
VISCOELASTIC ADDITIONAL (OPHTHALMIC RELATED) IMPLANT
WATER STERILE IRR 250ML POUR (IV SOLUTION) ×1 IMPLANT

## 2011-05-22 NOTE — Discharge Instructions (Signed)
[name] [name]  05/22/2011     Instructions  1. Use medications as Instructed.  Shake well before use. Wait 5 minutes between drops.  {OPHTHALMIC ANTIBIOTICS:22167} 4 times a day x 1 week.  {OPHTHALMIC ANTI-INFLAMMATORY:22168} 2 times a day x 4 weeks.  {OPHTHALMIC STEROID:22169} 4 times a day - week 1   3 times a day - Week 2, 2 times a day- Week 3, 1 time a day - Week 4.  2. Do not rub the operative eye. Do not swim underwater for 2 weeks.  3. You may remove the clear shield and resume your normal activities the day after  Surgery. Your eyes may feel more comfortable if you wear dark glasses outside.  4. Call our office at [phone] if you have sudden change in vision, extreme redness or pain. Some fluctuation in vision is normal after surgery. If you have an emergency after hours, call Dr. Geoffry Paradise at [phone].  5. It is important that you attend all of your follow-up appointments.        Follow-up:{follow up:32580} with [name], MD.   Dr. Loran Senters: [phone]  Dr. Iona HansenQN:[id number]  Dr. Geoffry ParadiseBB:[id number]   If you find that you cannot contact your physician, but feel that your signs and   Symptoms warrant a physician's attention, call the Emergency Room at   [phone].   Other{NA AND XH:[id number].

## 2011-05-22 NOTE — Op Note (Signed)
NAME:  [name] [name], [name] [name] NO.:  [id number]  MEDICAL RECORD NO.:  [id number]  LOCATION:  APPO                          FACILITY:  [facility]  PHYSICIAN:  Richardo Hanks, MD       DATE OF BIRTH:  16-Apr-1940  DATE OF PROCEDURE:  05/22/2011 DATE OF DISCHARGE:  05/22/2011                              OPERATIVE REPORT   PREOPERATIVE DIAGNOSIS:  Combined cataract, right eye, diagnosis code 366.19.  POSTOPERATIVE DIAGNOSIS:  Combined cataract, right eye, diagnosis code 366.19.  OPERATION PERFORMED:  Phacoemulsification with posterior chamber intraocular lens implantation, right eye.  SURGEON:  Franky Macho. Sherral Dirocco, MD  ANESTHESIA:  Topical with MAC.  OPERATIVE SUMMARY:  In the preoperative area, dilating drops were placed into the right eye.  The patient was then brought into the operating room where he was placed under general anesthesia.  The eye was then prepped and draped.  Beginning with a 75 blade, a paracentesis port was made at the surgeon's 2 o'clock position.  The anterior chamber was then filled with a 1% nonpreserved lidocaine solution with epinephrine.  This was followed by Viscoat to deepen the chamber.  A small fornix-based peritomy was performed superiorly.  Next, a single iris hook was placed through the limbus superiorly.  A 2.4-mm keratome blade was then used to make a clear corneal incision over the iris hook.  A bent cystotome needle and Utrata forceps were used to create a continuous tear capsulotomy.  Hydrodissection was performed using balanced salt solution on a fine cannula.  The lens nucleus was then removed using phacoemulsification in a quadrant cracking technique.  The cortical material was then removed with irrigation and aspiration.  The capsular bag and anterior chamber were refilled with Provisc.  The wound was widened to approximately 3 mm and a posterior chamber intraocular lens was placed into the capsular bag without difficulty using an  [employer] lens injecting system.  A single 10-0 nylon suture was then used to close the incision as well as stromal hydration.  The Provisc was removed from the anterior chamber and capsular bag with irrigation and aspiration.  At this point, the wounds were tested for leak, which were negative.  The anterior chamber remained deep and stable.  The patient tolerated the procedure well.  There were no operative complications, and he awoke from general anesthesia without problem.  No surgical specimens.  Prosthetic device used is a Lenstec posterior chamber lens, model Softec HD, power of 14.0, serial number is [id number].          ______________________________ Richardo Hanks, MD     KEH/MEDQ  D:  05/22/2011  T:  05/22/2011  Job:  [id number]

## 2011-05-22 NOTE — Anesthesia Preprocedure Evaluation (Signed)
Anesthesia Evaluation  Patient identified by MRN, date of birth, ID band Patient awake    Reviewed: Allergy & Precautions, H&P , NPO status , Patient's Chart, lab work & pertinent test results, reviewed documented beta blocker date and time   Airway Mallampati: III      Dental  (+) Edentulous Upper and Edentulous Lower   Pulmonary shortness of breath, pneumonia ,  breath sounds clear to auscultation        Cardiovascular hypertension, + CAD + dysrhythmias Atrial Fibrillation Rhythm:Irregular Rate:Normal     Neuro/Psych    GI/Hepatic   Endo/Other  Hypothyroidism   Renal/GU      Musculoskeletal   Abdominal   Peds  Hematology   Anesthesia Other Findings   Reproductive/Obstetrics                           Anesthesia Physical Anesthesia Plan  ASA: III  Anesthesia Plan: MAC   Post-op Pain Management:    Induction: Intravenous  Airway Management Planned: Nasal Cannula  Additional Equipment:   Intra-op Plan:   Post-operative Plan:   Informed Consent: I have reviewed the patients History and Physical, chart, labs and discussed the procedure including the risks, benefits and alternatives for the proposed anesthesia with the patient or authorized representative who has indicated his/her understanding and acceptance.     Plan Discussed with:   Anesthesia Plan Comments:         Anesthesia Quick Evaluation

## 2011-05-22 NOTE — Anesthesia Postprocedure Evaluation (Signed)
  Anesthesia Post-op Note  Patient: [name] [name]  Procedure(s) Performed: Procedure(s) (LRB): CATARACT EXTRACTION PHACO AND INTRAOCULAR LENS PLACEMENT (IOC) (Right)  Patient Location: PACU and Short Stay  Anesthesia Type: MAC  Level of Consciousness: awake, alert , oriented and patient cooperative  Airway and Oxygen Therapy: Patient Spontanous Breathing  Post-op Pain: none  Post-op Assessment: Post-op Vital signs reviewed, Patient's Cardiovascular Status Stable, Respiratory Function Stable, Patent Airway, No signs of Nausea or vomiting and Pain level controlled  Post-op Vital Signs: Reviewed and stable  Complications: No apparent anesthesia complications

## 2011-05-22 NOTE — H&P (Signed)
I have reviewed the H&P, the patient was re-examined, and I have identified no interval changes in medical condition and plan of care since the history and physical of record  

## 2011-05-22 NOTE — Brief Op Note (Signed)
Pre-Op Dx: Cataract OD Post-Op Dx: Cataract OD Surgeon: Raziya Aveni Anesthesia: Topical with MAC Surgery: Cataract Extraction with Intraocular lens Implant OD Implant: Lenstec, Model Softec HD Blood Loss: None Specimen: None Complications: None 

## 2011-05-22 NOTE — Transfer of Care (Signed)
Immediate Anesthesia Transfer of Care Note  Patient: [name] [name]  Procedure(s) Performed: Procedure(s) (LRB): CATARACT EXTRACTION PHACO AND INTRAOCULAR LENS PLACEMENT (IOC) (Right)  Patient Location: PACU and Short Stay  Anesthesia Type: MAC  Level of Consciousness: awake, alert , oriented and patient cooperative  Airway & Oxygen Therapy: Patient Spontanous Breathing  Post-op Assessment: Report given to PACU RN, Post -op Vital signs reviewed and stable and Patient moving all extremities  Post vital signs: Reviewed and stable  Complications: No apparent anesthesia complications

## 2011-05-24 ENCOUNTER — Encounter (HOSPITAL_COMMUNITY): Payer: Self-pay | Admitting: Ophthalmology

## 2012-03-15 ENCOUNTER — Ambulatory Visit: Payer: Self-pay | Admitting: Cardiovascular Disease

## 2012-03-15 DIAGNOSIS — I4891 Unspecified atrial fibrillation: Secondary | ICD-10-CM | POA: Insufficient documentation

## 2012-03-15 DIAGNOSIS — Z7901 Long term (current) use of anticoagulants: Secondary | ICD-10-CM | POA: Insufficient documentation

## 2012-06-05 ENCOUNTER — Ambulatory Visit (INDEPENDENT_AMBULATORY_CARE_PROVIDER_SITE_OTHER): Payer: Medicare Other | Admitting: Pharmacist Clinician (PhC)/ Clinical Pharmacy Specialist

## 2012-06-05 DIAGNOSIS — Z7901 Long term (current) use of anticoagulants: Secondary | ICD-10-CM

## 2012-06-05 DIAGNOSIS — I4891 Unspecified atrial fibrillation: Secondary | ICD-10-CM

## 2012-06-05 LAB — POCT INR: INR: 3

## 2012-07-11 ENCOUNTER — Other Ambulatory Visit (HOSPITAL_COMMUNITY): Payer: Self-pay | Admitting: Family Medicine

## 2012-07-11 DIAGNOSIS — Z139 Encounter for screening, unspecified: Secondary | ICD-10-CM

## 2012-07-11 DIAGNOSIS — R0989 Other specified symptoms and signs involving the circulatory and respiratory systems: Secondary | ICD-10-CM

## 2012-07-16 ENCOUNTER — Ambulatory Visit (HOSPITAL_COMMUNITY)
Admission: RE | Admit: 2012-07-16 | Discharge: 2012-07-16 | Disposition: A | Discharge status: Home / Self Care | Payer: Medicare Other | Source: Ambulatory Visit | Attending: Family Medicine | Admitting: Family Medicine

## 2012-07-16 ENCOUNTER — Other Ambulatory Visit (HOSPITAL_COMMUNITY): Payer: Self-pay | Admitting: Family Medicine

## 2012-07-16 DIAGNOSIS — R0989 Other specified symptoms and signs involving the circulatory and respiratory systems: Secondary | ICD-10-CM | POA: Insufficient documentation

## 2012-07-16 DIAGNOSIS — Z139 Encounter for screening, unspecified: Secondary | ICD-10-CM

## 2012-07-16 DIAGNOSIS — I10 Essential (primary) hypertension: Secondary | ICD-10-CM | POA: Insufficient documentation

## 2012-07-16 DIAGNOSIS — R109 Unspecified abdominal pain: Secondary | ICD-10-CM | POA: Insufficient documentation

## 2012-07-22 ENCOUNTER — Other Ambulatory Visit: Payer: Self-pay

## 2012-07-22 MED ORDER — FUROSEMIDE 20 MG PO TABS: 20.0000 mg | ORAL_TABLET | Freq: Every morning | ORAL | Status: DC

## 2012-07-22 NOTE — Telephone Encounter (Signed)
Rx was sent to pharmacy electronically. 

## 2012-09-23 ENCOUNTER — Telehealth: Payer: Self-pay | Admitting: Pharmacist Clinician (PhC)/ Clinical Pharmacy Specialist

## 2012-09-23 NOTE — Telephone Encounter (Signed)
Overdue INR letter sent

## 2013-01-30 ENCOUNTER — Telehealth: Payer: Self-pay | Admitting: Pharmacist Clinician (PhC)/ Clinical Pharmacy Specialist

## 2013-01-30 NOTE — Telephone Encounter (Signed)
LMOM for pt to call back - who is monitoring Coumadin/INR.

## 2013-01-31 ENCOUNTER — Ambulatory Visit: Payer: Self-pay | Admitting: Pharmacist Clinician (PhC)/ Clinical Pharmacy Specialist

## 2013-01-31 DIAGNOSIS — I4891 Unspecified atrial fibrillation: Secondary | ICD-10-CM

## 2013-01-31 DIAGNOSIS — Z7901 Long term (current) use of anticoagulants: Secondary | ICD-10-CM

## 2013-02-06 ENCOUNTER — Encounter: Payer: Self-pay | Admitting: *Deleted

## 2013-02-11 ENCOUNTER — Encounter: Payer: Self-pay | Admitting: Cardiovascular Disease

## 2013-02-11 ENCOUNTER — Ambulatory Visit (INDEPENDENT_AMBULATORY_CARE_PROVIDER_SITE_OTHER): Payer: Medicare HMO | Admitting: Cardiovascular Disease

## 2013-02-11 VITALS — BP 118/80 | HR 101 | Ht 74.5 in | Wt 267.8 lb

## 2013-02-11 DIAGNOSIS — I10 Essential (primary) hypertension: Secondary | ICD-10-CM

## 2013-02-11 DIAGNOSIS — N183 Chronic kidney disease, stage 3 unspecified: Secondary | ICD-10-CM | POA: Insufficient documentation

## 2013-02-11 DIAGNOSIS — G478 Other sleep disorders: Secondary | ICD-10-CM

## 2013-02-11 DIAGNOSIS — I4891 Unspecified atrial fibrillation: Secondary | ICD-10-CM

## 2013-02-11 MED ORDER — CARVEDILOL 25 MG PO TABS: 37.5000 mg | ORAL_TABLET | Freq: Two times a day (BID) | ORAL | Status: DC

## 2013-02-11 NOTE — Patient Instructions (Signed)
Your physician has recommended you make the following change in your medication: Stop losartan and increase carvedilol to 37.5 mg twice daily Your physician recommends that you schedule a follow-up appointment in: 12 months

## 2013-02-11 NOTE — Progress Notes (Signed)
Patient ID: [name] [name], male   DOB: 1940-07-31, 73 y.o.   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 Most recently he has had normal left ventricular systolic function. Significant comorbidities include hypertension and moderate chronic kidney disease with a creatinine that generally hovers around 1.8-2.1(followed by DrLowanda Foster). He is on warfarin anticoagulation and has not had significant bleeding problems. His INR is reported as being very steady. He does not have a history of stroke/TIA or other embolic events. He has moderate obesity but has actually lost a couple of pounds since last year. In fact, this is the first time he has not gained weight from year to year and a long time.  He is completely asymptomatic. His heart rate at rest is around 100 beats per minute today.   No Known Allergies  Current Outpatient Prescriptions  Medication Sig Dispense Refill  . carvedilol (COREG) 25 MG tablet Take 1.5 tablets (37.5 mg total) by mouth 2 (two) times daily with a meal.  270 tablet  3  . cholecalciferol (VITAMIN D) 1000 UNITS tablet Take 1,000 Units by mouth every morning.       . furosemide (LASIX) 20 MG tablet Take 1 tablet (20 mg total) by mouth every morning.  30 tablet  6  . levothyroxine (SYNTHROID, LEVOTHROID) 50 MCG tablet Take 50 mcg by mouth every morning.       . warfarin (COUMADIN) 5 MG tablet Take 5 mg by mouth every evening.        No current facility-administered medications for this visit.   Facility-Administered Medications Ordered in Other Visits  Medication Dose Route Frequency Provider Last Rate Last Dose  . neomycin-polymyxin-dexameth (MAXITROL) 0.1 % ophth ointment    PRN [name], MD   1 application at 123456  [id number]    Past Medical History  Diagnosis Date  . Pneumonia   . Dysrhythmia   . Shortness of breath   . Prostate cancer   . Hypothyroidism   . AF (atrial fibrillation)   . Cardiomyopathy     h/o  . Chronic kidney disease, stage 3   . Systemic hypertension     Past Surgical History  Procedure Laterality Date  . Cardioversion    . Prostate surgery      7 years ago [facility] Dr Javaid-prostatectomy  . Colonoscopy  01/17/2011    Procedure: COLONOSCOPY;  Surgeon: Jamesetta So;  Location: [facility];  Service: Gastroenterology;  Laterality: N/A;  . Cataract extraction w/phaco  04/17/2011    Procedure: CATARACT EXTRACTION PHACO AND INTRAOCULAR LENS PLACEMENT (IOC);  Surgeon: [name], MD;  Location: [facility];  Service: Ophthalmology;  Laterality: Left;  CDE:14.58  . Cataract extraction w/phaco  05/22/2011    Procedure: CATARACT EXTRACTION PHACO AND INTRAOCULAR LENS PLACEMENT (IOC);  Surgeon: [name], MD;  Location: [facility];  Service: Ophthalmology;  Laterality: Right;  CDE 13.02  . Sleep study  05/2011    increased upper airway resistance syndrome with mild sleep apnea during REM  . Transthoracic echocardiogram  04/2011    EF=50-55%; RV mildly dilated; LA mild-mod dilated; mild mitral valve annular calcif, mild MR; mod TR, RVSP elevated 30-81mmHg, mild pulm HTN; AV mildly sclerotic, mild calcif of AV leaflets; trace pulm valve regurg   . Nm  myocar perf wall motion  02/2009    dipyridamole; mild perfusion defect due to infarct/scar with mild perinfarct ischemia is apical septal, apical basal inferoseptal, basal inferior, mid inferoseptal mid inferior & apical inferior; EKG negative for ischemia; low risk scan    Family History  Problem Relation Age of Onset  . Anesthesia problems Neg Hx   . Hypotension Neg Hx   . Malignant hyperthermia Neg Hx   . Pseudochol deficiency Neg Hx   . Diabetes Mother   . Prostate cancer Father     History   Social History  . Marital Status: Single     Spouse Name: N/A    Number of Children: 2  . Years of Education: N/A   Occupational History  . Not on file.   Social History Main Topics  . Smoking status: Never Smoker   . Smokeless tobacco: Not on file  . Alcohol Use: No  . Drug Use: No  . Sexual Activity: Yes    Birth Control/ Protection: None   Other Topics Concern  . Not on file   Social History Narrative  . No narrative on file    Review of systems: The patient specifically denies any chest pain at rest or with exertion, dyspnea at rest or with exertion, orthopnea, paroxysmal nocturnal dyspnea, syncope, palpitations, focal neurological deficits, intermittent claudication, lower extremity edema, unexplained weight gain, cough, hemoptysis or wheezing.  The patient also denies abdominal pain, nausea, vomiting, dysphagia, diarrhea, constipation, polyuria, polydipsia, dysuria, hematuria, frequency, urgency, abnormal bleeding or bruising, fever, chills, unexpected weight changes, mood swings, change in skin or hair texture, change in voice quality, auditory or visual problems, allergic reactions or rashes, new musculoskeletal complaints other than usual "aches and pains".   PHYSICAL EXAM BP 118/80  Pulse 101  Ht 6' 2.5" (1.892 m)  Wt 121.473 kg (267 lb 12.8 oz)  BMI 33.93 kg/m2  General: Alert, oriented x3, no distress Head: no evidence of trauma, PERRL, EOMI, no exophtalmos or lid lag, no myxedema, no xanthelasma; normal ears, nose and oropharynx Neck: normal jugular venous pulsations and no hepatojugular reflux; brisk carotid pulses without delay and no carotid bruits Chest: clear to auscultation, no signs of consolidation by percussion or palpation, normal fremitus, symmetrical and full respiratory excursions Cardiovascular: normal position and quality of the apical impulse, irregular rhythm, normal first and second heart sounds, no murmurs, rubs or gallops Abdomen: no tenderness or distention, no masses by palpation, no  abnormal pulsatility or arterial bruits, normal bowel sounds, no hepatosplenomegaly Extremities: no clubbing, cyanosis or edema; 2+ radial, ulnar and brachial pulses bilaterally; 2+ right femoral, posterior tibial and dorsalis pedis pulses; 2+ left femoral, posterior tibial and dorsalis pedis pulses; no subclavian or femoral bruits Neurological: grossly nonfocal   EKG: Atrial fibrillation with rapid ventricular response, chronic left anterior fascicular block, no ischemic repolarization abnormalities  Lipid Panel     Component Value Date/Time   CHOL  Value: 142        ATP III CLASSIFICATION:  <200     mg/dL   Desirable  200-239  mg/dL   Borderline High  >=240    mg/dL   High        02/04/2009 0538   TRIG 122 02/04/2009 0538   HDL 33* 02/04/2009 0538   CHOLHDL 4.3 02/04/2009 0538   VLDL 24 02/04/2009 0538   LDLCALC  Value: 85        Total Cholesterol/HDL:CHD Risk Coronary Heart Disease Risk Table  Men   Women  1/2 Average Risk   3.4   3.3  Average Risk       5.0   4.4  2 X Average Risk   9.6   7.1  3 X Average Risk  23.4   11.0        Use the calculated Patient Ratio above and the CHD Risk Table to determine the patient's CHD Risk.        ATP III CLASSIFICATION (LDL):  <100     mg/dL   Optimal  100-129  mg/dL   Near or Above                    Optimal  130-159  mg/dL   Borderline  160-189  mg/dL   High  >190     mg/dL   Very High 02/04/2009 0538    BMET    Component Value Date/Time   NA 139 04/12/2011 1550   K 4.6 04/12/2011 1550   CL 104 04/12/2011 1550   CO2 24 04/12/2011 1550   GLUCOSE 93 04/12/2011 1550   BUN 34* 04/12/2011 1550   CREATININE 1.82* 04/12/2011 1550   CALCIUM 9.5 04/12/2011 1550   GFRNONAA 36* 04/12/2011 1550   GFRAA 41* 04/12/2011 1550     ASSESSMENT AND PLAN  I am pleased with the fact that he is asymptomatic in that he is tolerating full dose anticoagulation without significant problems, but I am a little concerned about his rapid ventricular rate at rest, especially  known that he has previously had serious complications of tachycardia related cardiomyopathy. His blood pressure is excellent. I advised him to increase the carvedilol to 37.5 mg twice a day to improve rate control and he will stop taking valsartan to avoid developing hypotension. I do not think that the angiotensin receptor blocker is critical for his renal problems since he does not have diabetes mellitus.  Orders Placed This Encounter  Procedures  . EKG 12-Lead     [name], MD, [facility] [phone] office [phone] pager

## 2013-02-16 ENCOUNTER — Other Ambulatory Visit: Payer: Self-pay | Admitting: Cardiovascular Disease

## 2013-02-17 NOTE — Telephone Encounter (Signed)
Rx was sent to pharmacy electronically. 

## 2013-02-18 ENCOUNTER — Other Ambulatory Visit: Payer: Self-pay | Admitting: *Deleted

## 2014-02-15 ENCOUNTER — Other Ambulatory Visit: Payer: Self-pay | Admitting: Cardiovascular Disease

## 2014-03-02 ENCOUNTER — Ambulatory Visit (INDEPENDENT_AMBULATORY_CARE_PROVIDER_SITE_OTHER): Payer: PPO | Admitting: Cardiovascular Disease

## 2014-03-02 ENCOUNTER — Encounter: Payer: Self-pay | Admitting: Cardiovascular Disease

## 2014-03-02 VITALS — BP 138/90 | HR 95 | Resp 16 | Ht 74.5 in | Wt 271.3 lb

## 2014-03-02 DIAGNOSIS — Z7901 Long term (current) use of anticoagulants: Secondary | ICD-10-CM

## 2014-03-02 DIAGNOSIS — I4891 Unspecified atrial fibrillation: Secondary | ICD-10-CM

## 2014-03-02 DIAGNOSIS — I10 Essential (primary) hypertension: Secondary | ICD-10-CM

## 2014-03-02 MED ORDER — CARVEDILOL 25 MG PO TABS: 50.0000 mg | ORAL_TABLET | Freq: Two times a day (BID) | ORAL | Status: DC

## 2014-03-02 NOTE — Patient Instructions (Signed)
INCREASE Carvedilol to 25mg  - take 2 tablets twice a day.  Dr. Sallyanne Kuster recommends that you schedule a follow-up appointment in: One year.

## 2014-03-02 NOTE — Progress Notes (Signed)
Patient ID: KAAN CASINI, male   DOB: [birth year], 74 y.o.   MRN: [id number]     Reason for office visit Permanent atrial fibrillation, history of tachycardia related cardiomyopathy  Mr. 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 Most recently he has had normal left ventricular systolic function. Significant comorbidities include hypertension and moderate chronic kidney disease with a creatinine that generally hovers around 1.8-2.1(followed by DrLowanda Foster). He is on warfarin anticoagulation followed in Dr. Cornelia Copa office and INR is reported as being very steady. He does not have a history of stroke/TIA or other embolic events, and has not had significant bleeding problems. His. He has moderate obesity and a previous sleep study showed upper airway resistance syndrome without frank obstructive sleep apnea. [place] and does not feel in any way limited compared with last year.  No Known Allergies  Current Outpatient Prescriptions  Medication Sig Dispense Refill  . cholecalciferol (VITAMIN D) 1000 UNITS tablet Take 1,000 Units by mouth every morning.     . furosemide (LASIX) 20 MG tablet TAKE ONE TABLET BY MOUTH ONCE DAILY IN THE MORNING 90 tablet 0  . levothyroxine (SYNTHROID, LEVOTHROID) 50 MCG tablet Take 50 mcg by mouth every morning.     . warfarin (COUMADIN) 4 MG tablet Take 4 mg by mouth daily. Alternate 4mg  with 2mg     . carvedilol (COREG) 25 MG tablet Take 2 tablets (50 mg total) by mouth 2 (two) times daily. 180 tablet 3   No current facility-administered medications for this visit.   Facility-Administered Medications Ordered in Other Visits  Medication Dose Route Frequency Provider Last Rate Last Dose  . neomycin-polymyxin-dexameth (MAXITROL) 0.1 % ophth ointment    PRN [name], MD   1 application at 123456  [id number]    Past Medical History  Diagnosis Date  . Pneumonia   . Dysrhythmia   . Shortness of breath   . Prostate cancer   . Hypothyroidism   . AF (atrial fibrillation)   . Cardiomyopathy     h/o  . Chronic kidney disease, stage 3   . Systemic hypertension     Past Surgical History  Procedure Laterality Date  . Cardioversion    . Prostate surgery      7 years ago [facility] Dr Javaid-prostatectomy  . Colonoscopy  01/17/2011    Procedure: COLONOSCOPY;  Surgeon: Jamesetta So;  Location: [facility];  Service: Gastroenterology;  Laterality: N/A;  . Cataract extraction w/phaco  04/17/2011    Procedure: CATARACT EXTRACTION PHACO AND INTRAOCULAR LENS PLACEMENT (IOC);  Surgeon: [name], MD;  Location: [facility];  Service: Ophthalmology;  Laterality: Left;  CDE:14.58  . Cataract extraction w/phaco  05/22/2011    Procedure: CATARACT EXTRACTION PHACO AND INTRAOCULAR LENS PLACEMENT (IOC);  Surgeon: [name], MD;  Location: [facility];  Service: Ophthalmology;  Laterality: Right;  CDE 13.02  . Sleep study  05/2011    increased upper airway resistance syndrome with mild sleep apnea during REM  . Transthoracic echocardiogram  04/2011    EF=50-55%; RV mildly dilated; LA mild-mod dilated; mild mitral valve annular calcif, mild MR; mod TR, RVSP elevated 30-67mmHg, mild pulm HTN; AV mildly sclerotic, mild calcif of AV leaflets; trace pulm valve regurg   . Nm myocar perf wall motion  02/2009    dipyridamole; mild perfusion defect due to infarct/scar with mild perinfarct  ischemia is apical septal, apical basal inferoseptal, basal inferior, mid inferoseptal mid inferior & apical inferior; EKG negative for ischemia; low risk scan    Family History  Problem Relation Age of Onset  . Anesthesia problems Neg Hx   . Hypotension Neg Hx   . Malignant hyperthermia Neg Hx   . Pseudochol deficiency Neg Hx   . Diabetes Mother   . Prostate cancer Father     History   Social History  . Marital Status: Single     Spouse Name: N/A  . Number of Children: 2  . Years of Education: N/A   Occupational History  . Not on file.   Social History Main Topics  . Smoking status: Never Smoker   . Smokeless tobacco: Not on file  . Alcohol Use: No  . Drug Use: No  . Sexual Activity: Yes    Birth Control/ Protection: None   Other Topics Concern  . Not on file   Social History Narrative    Review of systems: The patient specifically denies any chest pain at rest or with exertion, dyspnea at rest or with exertion, orthopnea, paroxysmal nocturnal dyspnea, syncope, palpitations, focal neurological deficits, intermittent claudication, lower extremity edema, unexplained weight gain, cough, hemoptysis or wheezing.  The patient also denies abdominal pain, nausea, vomiting, dysphagia, diarrhea, constipation, polyuria, polydipsia, dysuria, hematuria, frequency, urgency, abnormal bleeding or bruising, fever, chills, unexpected weight changes, mood swings, change in skin or hair texture, change in voice quality, auditory or visual problems, allergic reactions or rashes, new musculoskeletal complaints other than usual "aches and pains".   PHYSICAL EXAM BP 138/90 mmHg  Pulse 95  Resp 16  Ht 6' 2.5" (1.892 m)  Wt 123.061 kg (271 lb 4.8 oz)  BMI 34.38 kg/m2  General: Alert, oriented x3, no distress Head: no evidence of trauma, PERRL, EOMI, no exophtalmos or lid lag, no myxedema, no xanthelasma; normal ears, nose and oropharynx Neck: normal jugular venous pulsations and no hepatojugular reflux; brisk carotid pulses without delay and no carotid bruits Chest: clear to auscultation, no signs of consolidation by percussion or palpation, normal fremitus, symmetrical and full respiratory excursions Cardiovascular: normal position and quality of the apical impulse, irregular rhythm, normal first and second heart sounds, no murmurs, rubs or gallops Abdomen: no tenderness or distention, no masses by palpation, no abnormal  pulsatility or arterial bruits, normal bowel sounds, no hepatosplenomegaly Extremities: no clubbing, cyanosis or edema; 2+ radial, ulnar and brachial pulses bilaterally; 2+ right femoral, posterior tibial and dorsalis pedis pulses; 2+ left femoral, posterior tibial and dorsalis pedis pulses; no subclavian or femoral bruits Neurological: grossly nonfocal   EKG: Atrial fibrillation 95 bpm, nonspecific T-wave changes, borderline criteria for left anterior fascicular block  Lipid Panel     Component Value Date/Time   CHOL  02/04/2009 0538    142        ATP III CLASSIFICATION:  <200     mg/dL   Desirable  200-239  mg/dL   Borderline High  >=240    mg/dL   High          TRIG 122 02/04/2009 0538   HDL 33* 02/04/2009 0538   CHOLHDL 4.3 02/04/2009 0538   VLDL 24 02/04/2009 0538   LDLCALC  02/04/2009 0538    85        Total Cholesterol/HDL:CHD Risk Coronary Heart Disease Risk Table                     Men  Women  1/2 Average Risk   3.4   3.3  Average Risk       5.0   4.4  2 X Average Risk   9.6   7.1  3 X Average Risk  23.4   11.0        Use the calculated Patient Ratio above and the CHD Risk Table to determine the patient's CHD Risk.        ATP III CLASSIFICATION (LDL):  <100     mg/dL   Optimal  100-129  mg/dL   Near or Above                    Optimal  130-159  mg/dL   Borderline  160-189  mg/dL   High  >190     mg/dL   Very High    BMET    Component Value Date/Time   NA 139 04/12/2011 1550   K 4.6 04/12/2011 1550   CL 104 04/12/2011 1550   CO2 24 04/12/2011 1550   GLUCOSE 93 04/12/2011 1550   BUN 34* 04/12/2011 1550   CREATININE 1.82* 04/12/2011 1550   CALCIUM 9.5 04/12/2011 1550   GFRNONAA 36* 04/12/2011 1550   GFRAA 41* 04/12/2011 1550     ASSESSMENT AND PLAN  Mr. Demory is generally doing well. Both his systolic blood pressure and diastolic blood pressure as well as his resting heart rate are borderline high. I recommended he increase his carvedilol to 50  mg twice a day, knowing that he had serious complications from rapid ventricular rates in the past. Continue on lifelong anticoagulation barring any bleeding complications. We'll try to get a copy of his most recent thyroid assay and labs. Follow-up yearly.  Orders Placed This Encounter  Procedures  . EKG 12-Lead   Meds ordered this encounter  Medications  . warfarin (COUMADIN) 4 MG tablet    Sig: Take 4 mg by mouth daily. Alternate 4mg  with 2mg   . carvedilol (COREG) 25 MG tablet    Sig: Take 2 tablets (50 mg total) by mouth 2 (two) times daily.    Dispense:  180 tablet    Refill:  [place] Latrecia Capito, MD, [facility] [phone] office [phone] pager

## 2014-05-04 ENCOUNTER — Other Ambulatory Visit: Payer: Self-pay | Admitting: Cardiovascular Disease

## 2014-05-05 NOTE — Telephone Encounter (Signed)
Rx has been sent to the pharmacy electronically. ° °

## 2014-08-31 ENCOUNTER — Encounter: Payer: Self-pay | Admitting: "Endocrinology

## 2014-08-31 DIAGNOSIS — E212 Other hyperparathyroidism: Secondary | ICD-10-CM | POA: Insufficient documentation

## 2014-12-23 ENCOUNTER — Other Ambulatory Visit: Payer: Self-pay | Admitting: "Endocrinology

## 2014-12-24 LAB — PARATHYROID HORMONE, INTACT (NO CA): PTH: 40 pg/mL (ref 15–65)

## 2014-12-24 LAB — COMPREHENSIVE METABOLIC PANEL
ALT: 16 IU/L (ref 0–44)
AST: 16 IU/L (ref 0–40)
Albumin/Globulin Ratio: 1.6 (ref 1.1–2.5)
Albumin: 3.8 g/dL (ref 3.5–4.8)
Alkaline Phosphatase: 60 IU/L (ref 39–117)
BUN / CREAT RATIO: 15 (ref 10–22)
BUN: 26 mg/dL (ref 8–27)
Bilirubin Total: 0.3 mg/dL (ref 0.0–1.2)
CO2: 22 mmol/L (ref 18–29)
CREATININE: 1.73 mg/dL — AB (ref 0.76–1.27)
Calcium: 8.5 mg/dL — ABNORMAL LOW (ref 8.6–10.2)
Chloride: 104 mmol/L (ref 96–106)
GFR calc Af Amer: 44 mL/min/{1.73_m2} — ABNORMAL LOW (ref >59)
GFR calc non Af Amer: 38 mL/min/{1.73_m2} — ABNORMAL LOW (ref >59)
GLOBULIN, TOTAL: 2.4 g/dL (ref 1.5–4.5)
Glucose: 109 mg/dL — ABNORMAL HIGH (ref 65–99)
Potassium: 4.3 mmol/L (ref 3.5–5.2)
Sodium: 144 mmol/L (ref 134–144)
TOTAL PROTEIN: 6.2 g/dL (ref 6.0–8.5)

## 2014-12-24 LAB — VITAMIN D 25 HYDROXY (VIT D DEFICIENCY, FRACTURES): Vit D, 25-Hydroxy: 48.5 ng/mL (ref 30.0–100.0)

## 2014-12-24 LAB — T4, FREE: FREE T4: 1.26 ng/dL (ref 0.82–1.77)

## 2014-12-24 LAB — TSH: TSH: 3.14 u[IU]/mL (ref 0.450–4.500)

## 2014-12-31 ENCOUNTER — Ambulatory Visit (INDEPENDENT_AMBULATORY_CARE_PROVIDER_SITE_OTHER): Payer: PPO | Admitting: "Endocrinology

## 2014-12-31 ENCOUNTER — Encounter: Payer: Self-pay | Admitting: "Endocrinology

## 2014-12-31 VITALS — BP 148/82 | HR 97 | Ht 74.5 in | Wt 261.0 lb

## 2014-12-31 DIAGNOSIS — E559 Vitamin D deficiency, unspecified: Secondary | ICD-10-CM | POA: Insufficient documentation

## 2014-12-31 DIAGNOSIS — E038 Other specified hypothyroidism: Secondary | ICD-10-CM | POA: Insufficient documentation

## 2014-12-31 DIAGNOSIS — E212 Other hyperparathyroidism: Secondary | ICD-10-CM

## 2014-12-31 MED ORDER — VITAMIN D (ERGOCALCIFEROL) 1.25 MG (50000 UNIT) PO CAPS: 50000.0000 [IU] | ORAL_CAPSULE | ORAL | Status: DC

## 2015-01-01 NOTE — Progress Notes (Signed)
Subjective:    Patient ID: [name] [name], male    DOB: 1940/04/04, PCP [name], MD   Past Medical History  Diagnosis Date  . Pneumonia   . Dysrhythmia   . Shortness of breath   . Prostate cancer ([place])   . Hypothyroidism   . AF (atrial fibrillation) ([place])   . Cardiomyopathy ([place])     h/o  . Chronic kidney disease, stage 3   . Systemic hypertension    Past Surgical History  Procedure Laterality Date  . Cardioversion    . Prostate surgery      7 years ago [facility] Dr Javaid-prostatectomy  . Colonoscopy  01/17/2011    Procedure: COLONOSCOPY;  Surgeon: Jamesetta So;  Location: [facility];  Service: Gastroenterology;  Laterality: N/A;  . Cataract extraction w/phaco  04/17/2011    Procedure: CATARACT EXTRACTION PHACO AND INTRAOCULAR LENS PLACEMENT (IOC);  Surgeon: [name], MD;  Location: [facility];  Service: Ophthalmology;  Laterality: Left;  CDE:14.58  . Cataract extraction w/phaco  05/22/2011    Procedure: CATARACT EXTRACTION PHACO AND INTRAOCULAR LENS PLACEMENT (IOC);  Surgeon: [name], MD;  Location: [facility];  Service: Ophthalmology;  Laterality: Right;  CDE 13.02  . Sleep study  05/2011    increased upper airway resistance syndrome with mild sleep apnea during REM  . Transthoracic echocardiogram  04/2011    EF=50-55%; RV mildly dilated; LA mild-mod dilated; mild mitral valve annular calcif, mild MR; mod TR, RVSP elevated 30-28mmHg, mild pulm HTN; AV mildly sclerotic, mild calcif of AV leaflets; trace pulm valve regurg   . Nm myocar perf wall motion  02/2009    dipyridamole; mild perfusion defect due to infarct/scar with mild perinfarct ischemia is apical septal, apical basal inferoseptal, basal inferior, mid inferoseptal mid inferior & apical inferior; EKG negative for ischemia; low risk scan  . Prostatectomy     Social History   Social History  . Marital Status: Single    Spouse Name: N/A  . Number of Children: 2  . Years of Education: N/A   Social History  Main Topics  . Smoking status: Never Smoker   . Smokeless tobacco: None  . Alcohol Use: No  . Drug Use: No  . Sexual Activity: Yes    Birth Control/ Protection: None   Other Topics Concern  . None   Social History Narrative   Outpatient Encounter Prescriptions as of 12/31/2014  Medication Sig  . Calcium Citrate-Vitamin D (CALCIUM + D PO) Take by mouth daily.  . carvedilol (COREG) 25 MG tablet Take 2 tablets (50 mg total) by mouth 2 (two) times daily.  Marland Kitchen levothyroxine (SYNTHROID, LEVOTHROID) 50 MCG tablet Take 50 mcg by mouth every morning.   Marland Kitchen losartan (COZAAR) 25 MG tablet Take 25 mg by mouth daily.  Marland Kitchen warfarin (COUMADIN) 4 MG tablet Take 4 mg by mouth daily. Alternate 4mg  with 2mg   . Vitamin D, Ergocalciferol, (DRISDOL) 50000 UNITS CAPS capsule Take 1 capsule (50,000 Units total) by mouth every 7 (seven) days.  . [DISCONTINUED] cholecalciferol (VITAMIN D) 1000 UNITS tablet Take 1,000 Units by mouth every morning.   . [DISCONTINUED] furosemide (LASIX) 20 MG tablet TAKE ONE TABLET BY MOUTH ONCE DAILY IN THE MORNING   Facility-Administered Encounter Medications as of 12/31/2014  Medication  . neomycin-polymyxin-dexameth (MAXITROL) 0.1 % ophth ointment   ALLERGIES: No Known Allergies VACCINATION STATUS:  There is no immunization history on file for this patient.  HPI  74 yr old male  with above medical hx. he is here to f/u for hypothyroidism , hypocalcemia, vitamin D deficiency.  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 he is compliant. He has no new complaints.  he denies cold/heat intolerance. he give hx of progressive weight loss of 20 lbs in 2 years from "eating healthy". he denies hx of goiter , nor family hx of thyroid cancer. he survives prostate cancer. -he has history of tertiary hyperparathyroidism with PTH as high as 25 which is currently improving to 40 with supplement of calcium and  vitamin D. .  his 24 hour urine calcium is now done and low at 57.   Review of Systems  Constitutional: no weight gain/loss, no fatigue, no subjective hyperthermia/hypothermia Eyes: no blurry vision, no xerophthalmia ENT: no sore throat, no nodules palpated in throat, no dysphagia/odynophagia, no hoarseness Cardiovascular: no CP/SOB/palpitations/leg swelling Respiratory: no cough/SOB Gastrointestinal: no N/V/D/C Musculoskeletal: no muscle/joint aches Skin: no rashes Neurological: no tremors/numbness/tingling/dizziness Psychiatric: no depression/anxiety  Objective:    BP 148/82 mmHg  Pulse 97  Ht 6' 2.5" (1.892 m)  Wt 261 lb (118.389 kg)  BMI 33.07 kg/m2  SpO2 99%  Wt Readings from Last 3 Encounters:  12/31/14 261 lb (118.389 kg)  08/31/14 264 lb (119.75 kg)  03/02/14 271 lb 4.8 oz (123.061 kg)    Physical Exam   Constitutional: overweight, in NAD Eyes: PERRLA, EOMI, no exophthalmos ENT: moist mucous membranes, no thyromegaly, no cervical lymphadenopathy Cardiovascular: RRR, No MRG Respiratory: CTA B Gastrointestinal: abdomen soft, NT, ND, BS+ Musculoskeletal: no deformities, strength intact in all 4 Skin: moist, warm, no rashes Neurological: no tremor with outstretched hands, DTR normal in all 4  Results for orders placed or performed in visit on 12/23/14  Comprehensive metabolic panel  Result Value Ref Range   Glucose 109 (H) 65 - 99 mg/dL   BUN 26 8 - 27 mg/dL   Creatinine, Ser 1.73 (H) 0.76 - 1.27 mg/dL   GFR calc non Af Amer 38 (L) >59 mL/min/1.73   GFR calc Af Amer 44 (L) >59 mL/min/1.73   BUN/Creatinine Ratio 15 10 - 22   Sodium 144 134 - 144 mmol/L   Potassium 4.3 3.5 - 5.2 mmol/L   Chloride 104 96 - 106 mmol/L   CO2 22 18 - 29 mmol/L   Calcium 8.5 (L) 8.6 - 10.2 mg/dL   Total Protein 6.2 6.0 - 8.5 g/dL   Albumin 3.8 3.5 - 4.8 g/dL   Globulin, Total 2.4 1.5 - 4.5 g/dL   Albumin/Globulin Ratio 1.6 1.1 - 2.5   Bilirubin Total 0.3 0.0 - 1.2 mg/dL    Alkaline Phosphatase 60 39 - 117 IU/L   AST 16 0 - 40 IU/L   ALT 16 0 - 44 IU/L  T4, free  Result Value Ref Range   Free T4 1.26 0.82 - 1.77 ng/dL  TSH  Result Value Ref Range   TSH 3.140 0.450 - 4.500 uIU/mL  VITAMIN D 25 Hydroxy (Vit-D Deficiency, Fractures)  Result Value Ref Range   Vit D, 25-Hydroxy 48.5 30.0 - 100.0 ng/mL  Parathyroid hormone, intact (no Ca)  Result Value Ref Range   PTH 40 15 - 65 pg/mL    Lipid Panel     Component Value Date/Time   CHOL  02/04/2009 0538    142        ATP III CLASSIFICATION:  <200     mg/dL  Desirable  200-239  mg/dL   Borderline High  >=240    mg/dL   High          TRIG 122 02/04/2009 0538   HDL 33* 02/04/2009 0538   CHOLHDL 4.3 02/04/2009 0538   VLDL 24 02/04/2009 0538   LDLCALC  02/04/2009 0538    85        Total Cholesterol/HDL:CHD Risk Coronary Heart Disease Risk Table                     Men   Women  1/2 Average Risk   3.4   3.3  Average Risk       5.0   4.4  2 X Average Risk   9.6   7.1  3 X Average Risk  23.4   11.0        Use the calculated Patient Ratio above and the CHD Risk Table to determine the patient's CHD Risk.        ATP III CLASSIFICATION (LDL):  <100     mg/dL   Optimal  100-129  mg/dL   Near or Above                    Optimal  130-159  mg/dL   Borderline  160-189  mg/dL   High  >190     mg/dL   Very High      Assessment & Plan:   1.  hypothyroidism  His recent TFTs are appropriate , pt is clinically euthyroid on LT4 50 mcg. He will be continued on same dose.   - We discussed about correct intake of levothyroxine, at fasting, with water, separated by at least 30 minutes from breakfast, and separated by more than 4 hours from calcium, iron, multivitamins, acid reflux medications (PPIs). -Patient is made aware of the fact that thyroid hormone replacement is needed for life, dose to be adjusted by periodic monitoring of thyroid function tests.   2. Tertiary hyperparathyroidism  With  Hypocalcemia  His last PTH is better at 40 from 42 ( could be CKD effect) along with calcium of low normal at 8.5. His calcium level is improving. -I advised him to continue daily supplement of calcium along with vitamin D. His 24 hour urine calcium is low at 57  3. Vitamin D deficiency  -continue vitamin D 50,000 units weekly for the next 12 weeks.  - I advised patient to maintain close follow up with [name], MD for primary care needs. Follow up plan: Return in about 6 months (around 07/01/2015) for underactive thyroid, hypocalcemia, Vit d deficiency, follow up with pre-visit labs.  Glade Lloyd, MD Phone: [phone]  Fax: [phone]   01/01/2015, 10:46 AM

## 2015-01-12 DIAGNOSIS — I4891 Unspecified atrial fibrillation: Secondary | ICD-10-CM | POA: Diagnosis not present

## 2015-01-26 DIAGNOSIS — I4891 Unspecified atrial fibrillation: Secondary | ICD-10-CM | POA: Diagnosis not present

## 2015-02-01 DIAGNOSIS — E559 Vitamin D deficiency, unspecified: Secondary | ICD-10-CM | POA: Diagnosis not present

## 2015-02-01 DIAGNOSIS — I10 Essential (primary) hypertension: Secondary | ICD-10-CM | POA: Diagnosis not present

## 2015-02-01 DIAGNOSIS — R809 Proteinuria, unspecified: Secondary | ICD-10-CM | POA: Diagnosis not present

## 2015-02-01 DIAGNOSIS — Z79899 Other long term (current) drug therapy: Secondary | ICD-10-CM | POA: Diagnosis not present

## 2015-02-01 DIAGNOSIS — N183 Chronic kidney disease, stage 3 (moderate): Secondary | ICD-10-CM | POA: Diagnosis not present

## 2015-02-01 DIAGNOSIS — D509 Iron deficiency anemia, unspecified: Secondary | ICD-10-CM | POA: Diagnosis not present

## 2015-02-09 DIAGNOSIS — I4891 Unspecified atrial fibrillation: Secondary | ICD-10-CM | POA: Diagnosis not present

## 2015-02-09 DIAGNOSIS — I509 Heart failure, unspecified: Secondary | ICD-10-CM | POA: Diagnosis not present

## 2015-02-09 DIAGNOSIS — D649 Anemia, unspecified: Secondary | ICD-10-CM | POA: Diagnosis not present

## 2015-02-09 DIAGNOSIS — R809 Proteinuria, unspecified: Secondary | ICD-10-CM | POA: Diagnosis not present

## 2015-02-09 DIAGNOSIS — N183 Chronic kidney disease, stage 3 (moderate): Secondary | ICD-10-CM | POA: Diagnosis not present

## 2015-02-09 DIAGNOSIS — I10 Essential (primary) hypertension: Secondary | ICD-10-CM | POA: Diagnosis not present

## 2015-02-09 DIAGNOSIS — E870 Hyperosmolality and hypernatremia: Secondary | ICD-10-CM | POA: Diagnosis not present

## 2015-02-14 ENCOUNTER — Other Ambulatory Visit: Payer: Self-pay | Admitting: Cardiovascular Disease

## 2015-02-15 NOTE — Telephone Encounter (Signed)
Rx request sent to pharmacy.  

## 2015-02-23 DIAGNOSIS — I4891 Unspecified atrial fibrillation: Secondary | ICD-10-CM | POA: Diagnosis not present

## 2015-03-24 DIAGNOSIS — I4891 Unspecified atrial fibrillation: Secondary | ICD-10-CM | POA: Diagnosis not present

## 2015-04-06 DIAGNOSIS — I4891 Unspecified atrial fibrillation: Secondary | ICD-10-CM | POA: Diagnosis not present

## 2015-05-02 ENCOUNTER — Other Ambulatory Visit: Payer: Self-pay | Admitting: Cardiovascular Disease

## 2015-05-03 NOTE — Telephone Encounter (Signed)
Rx(s) sent to pharmacy electronically.  

## 2015-05-10 DIAGNOSIS — D509 Iron deficiency anemia, unspecified: Secondary | ICD-10-CM | POA: Diagnosis not present

## 2015-05-10 DIAGNOSIS — E559 Vitamin D deficiency, unspecified: Secondary | ICD-10-CM | POA: Diagnosis not present

## 2015-05-10 DIAGNOSIS — N183 Chronic kidney disease, stage 3 (moderate): Secondary | ICD-10-CM | POA: Diagnosis not present

## 2015-05-10 DIAGNOSIS — I10 Essential (primary) hypertension: Secondary | ICD-10-CM | POA: Diagnosis not present

## 2015-05-10 DIAGNOSIS — I4891 Unspecified atrial fibrillation: Secondary | ICD-10-CM | POA: Diagnosis not present

## 2015-05-10 DIAGNOSIS — R809 Proteinuria, unspecified: Secondary | ICD-10-CM | POA: Diagnosis not present

## 2015-05-10 DIAGNOSIS — Z79899 Other long term (current) drug therapy: Secondary | ICD-10-CM | POA: Diagnosis not present

## 2015-05-13 DIAGNOSIS — I4891 Unspecified atrial fibrillation: Secondary | ICD-10-CM | POA: Diagnosis not present

## 2015-05-18 DIAGNOSIS — N183 Chronic kidney disease, stage 3 (moderate): Secondary | ICD-10-CM | POA: Diagnosis not present

## 2015-05-18 DIAGNOSIS — R809 Proteinuria, unspecified: Secondary | ICD-10-CM | POA: Diagnosis not present

## 2015-05-18 DIAGNOSIS — I509 Heart failure, unspecified: Secondary | ICD-10-CM | POA: Diagnosis not present

## 2015-05-18 DIAGNOSIS — E870 Hyperosmolality and hypernatremia: Secondary | ICD-10-CM | POA: Diagnosis not present

## 2015-05-18 DIAGNOSIS — D649 Anemia, unspecified: Secondary | ICD-10-CM | POA: Diagnosis not present

## 2015-05-18 DIAGNOSIS — E559 Vitamin D deficiency, unspecified: Secondary | ICD-10-CM | POA: Diagnosis not present

## 2015-05-18 DIAGNOSIS — I10 Essential (primary) hypertension: Secondary | ICD-10-CM | POA: Diagnosis not present

## 2015-05-31 DIAGNOSIS — I4891 Unspecified atrial fibrillation: Secondary | ICD-10-CM | POA: Diagnosis not present

## 2015-06-06 ENCOUNTER — Other Ambulatory Visit: Payer: Self-pay | Admitting: Cardiovascular Disease

## 2015-06-14 DIAGNOSIS — Z79899 Other long term (current) drug therapy: Secondary | ICD-10-CM | POA: Diagnosis not present

## 2015-06-14 DIAGNOSIS — I10 Essential (primary) hypertension: Secondary | ICD-10-CM | POA: Diagnosis not present

## 2015-06-14 DIAGNOSIS — N183 Chronic kidney disease, stage 3 (moderate): Secondary | ICD-10-CM | POA: Diagnosis not present

## 2015-06-22 DIAGNOSIS — I10 Essential (primary) hypertension: Secondary | ICD-10-CM | POA: Diagnosis not present

## 2015-06-22 DIAGNOSIS — E870 Hyperosmolality and hypernatremia: Secondary | ICD-10-CM | POA: Diagnosis not present

## 2015-06-22 DIAGNOSIS — N183 Chronic kidney disease, stage 3 (moderate): Secondary | ICD-10-CM | POA: Diagnosis not present

## 2015-06-22 DIAGNOSIS — I4891 Unspecified atrial fibrillation: Secondary | ICD-10-CM | POA: Diagnosis not present

## 2015-06-22 DIAGNOSIS — I509 Heart failure, unspecified: Secondary | ICD-10-CM | POA: Diagnosis not present

## 2015-06-22 DIAGNOSIS — E669 Obesity, unspecified: Secondary | ICD-10-CM | POA: Diagnosis not present

## 2015-06-22 DIAGNOSIS — R809 Proteinuria, unspecified: Secondary | ICD-10-CM | POA: Diagnosis not present

## 2015-06-24 ENCOUNTER — Other Ambulatory Visit: Payer: Self-pay | Admitting: "Endocrinology

## 2015-06-24 DIAGNOSIS — E212 Other hyperparathyroidism: Secondary | ICD-10-CM | POA: Diagnosis not present

## 2015-06-24 DIAGNOSIS — E038 Other specified hypothyroidism: Secondary | ICD-10-CM | POA: Diagnosis not present

## 2015-06-25 LAB — BASIC METABOLIC PANEL
BUN / CREAT RATIO: 13 (ref 10–24)
BUN: 27 mg/dL (ref 8–27)
CALCIUM: 9.3 mg/dL (ref 8.6–10.2)
CHLORIDE: 105 mmol/L (ref 96–106)
CO2: 19 mmol/L (ref 18–29)
CREATININE: 2.13 mg/dL — AB (ref 0.76–1.27)
GFR, EST AFRICAN AMERICAN: 34 mL/min/{1.73_m2} — AB (ref >59)
GFR, EST NON AFRICAN AMERICAN: 29 mL/min/{1.73_m2} — AB (ref >59)
Glucose: 126 mg/dL — ABNORMAL HIGH (ref 65–99)
POTASSIUM: 4.2 mmol/L (ref 3.5–5.2)
SODIUM: 143 mmol/L (ref 134–144)

## 2015-06-25 LAB — T4, FREE: Free T4: 1.3 ng/dL (ref 0.82–1.77)

## 2015-06-25 LAB — PTH, INTACT AND CALCIUM: PTH: 33 pg/mL (ref 15–65)

## 2015-06-25 LAB — TSH: TSH: 2.24 u[IU]/mL (ref 0.450–4.500)

## 2015-07-02 ENCOUNTER — Encounter: Payer: Self-pay | Admitting: "Endocrinology

## 2015-07-02 ENCOUNTER — Ambulatory Visit (INDEPENDENT_AMBULATORY_CARE_PROVIDER_SITE_OTHER): Payer: PPO | Admitting: "Endocrinology

## 2015-07-02 VITALS — BP 115/70 | HR 88 | Ht 74.5 in | Wt 257.0 lb

## 2015-07-02 DIAGNOSIS — E038 Other specified hypothyroidism: Secondary | ICD-10-CM | POA: Diagnosis not present

## 2015-07-02 DIAGNOSIS — E559 Vitamin D deficiency, unspecified: Secondary | ICD-10-CM | POA: Diagnosis not present

## 2015-07-02 DIAGNOSIS — E212 Other hyperparathyroidism: Secondary | ICD-10-CM | POA: Diagnosis not present

## 2015-07-02 DIAGNOSIS — I10 Essential (primary) hypertension: Secondary | ICD-10-CM | POA: Diagnosis not present

## 2015-07-02 MED ORDER — LEVOTHYROXINE SODIUM 50 MCG PO TABS: 50.0000 ug | ORAL_TABLET | Freq: Every morning | ORAL | Status: DC

## 2015-07-02 NOTE — Progress Notes (Signed)
Subjective:    Patient ID: [name] [name], male    DOB: Dec 26, 1940, PCP [name], MD   Past Medical History  Diagnosis Date  . Pneumonia   . Dysrhythmia   . Shortness of breath   . Prostate cancer ([place])   . Hypothyroidism   . AF (atrial fibrillation) ([place])   . Cardiomyopathy ([place])     h/o  . Chronic kidney disease, stage 3   . Systemic hypertension    Past Surgical History  Procedure Laterality Date  . Cardioversion    . Prostate surgery      7 years ago [facility] Dr Javaid-prostatectomy  . Colonoscopy  01/17/2011    Procedure: COLONOSCOPY;  Surgeon: Jamesetta So;  [facility];  Service: Gastroenterology;  Laterality: N/A;  . Cataract extraction w/phaco  04/17/2011    Procedure: CATARACT EXTRACTION PHACO AND INTRAOCULAR LENS PLACEMENT (IOC);  Surgeon: [name], MD;  Location: [facility];  Service: Ophthalmology;  Laterality: Left;  CDE:14.58  . Cataract extraction w/phaco  05/22/2011    Procedure: CATARACT EXTRACTION PHACO AND INTRAOCULAR LENS PLACEMENT (IOC);  Surgeon: [name], MD;  Location: [facility];  Service: Ophthalmology;  Laterality: Right;  CDE 13.02  . Sleep study  05/2011    increased upper airway resistance syndrome with mild sleep apnea during REM  . Transthoracic echocardiogram  04/2011    EF=50-55%; RV mildly dilated; LA mild-mod dilated; mild mitral valve annular calcif, mild MR; mod TR, RVSP elevated 30-7mmHg, mild pulm HTN; AV mildly sclerotic, mild calcif of AV leaflets; trace pulm valve regurg   . Nm myocar perf wall motion  02/2009    dipyridamole; mild perfusion defect due to infarct/scar with mild perinfarct ischemia is apical septal, apical basal inferoseptal, basal inferior, mid inferoseptal mid inferior & apical inferior; EKG negative for ischemia; low risk scan  . Prostatectomy     Social History   Social History  . Marital Status: Single    Spouse Name: N/A  . Number of Children: 2  . Years of Education: N/A   Social History  Main Topics  . Smoking status: Never Smoker   . Smokeless tobacco: None  . Alcohol Use: No  . Drug Use: No  . Sexual Activity: Yes    Birth Control/ Protection: None   Other Topics Concern  . None   Social History Narrative   Outpatient Encounter Prescriptions as of 07/02/2015  Medication Sig  . Calcium Citrate-Vitamin D (CALCIUM + D PO) Take by mouth daily.  . carvedilol (COREG) 25 MG tablet Take 2 tablets (50 mg total) by mouth 2 (two) times daily.  . furosemide (LASIX) 20 MG tablet Take 1 tablet (20 mg total) by mouth daily.  Marland Kitchen levothyroxine (SYNTHROID, LEVOTHROID) 50 MCG tablet Take 1 tablet (50 mcg total) by mouth every morning.  Marland Kitchen losartan (COZAAR) 25 MG tablet Take 25 mg by mouth daily.  . Vitamin D, Ergocalciferol, (DRISDOL) 50000 UNITS CAPS capsule Take 1 capsule (50,000 Units total) by mouth every 7 (seven) days.  Marland Kitchen warfarin (COUMADIN) 4 MG tablet Take 4 mg by mouth daily. Alternate 4mg  with 2mg   . [DISCONTINUED] levothyroxine (SYNTHROID, LEVOTHROID) 50 MCG tablet Take 50 mcg by mouth every morning.    Facility-Administered Encounter Medications as of 07/02/2015  Medication  . neomycin-polymyxin-dexameth (MAXITROL) 0.1 % ophth ointment   ALLERGIES: No Known Allergies VACCINATION STATUS:  There is no immunization history on file for this patient.  HPI  75 yr old male  with above medical hx. he is here to f/u for hypothyroidism , hypocalcemia, vitamin D deficiency.  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 he is compliant. He has no new complaints.  he denies cold/heat intolerance. he give hx of progressive weight loss of 20 lbs in 2 years from "eating healthy", but has now steady weight.  he denies hx of goiter , nor family hx of thyroid cancer. he survives prostate cancer. -he has history of tertiary hyperparathyroidism with PTH as high as 51 which is currently improving to 33 with  supplement of calcium and vitamin D. .  his 24 hour urine calcium has been  low at 57.   Review of Systems  Constitutional: no weight gain/loss, no fatigue, no subjective hyperthermia/hypothermia Eyes: no blurry vision, no xerophthalmia ENT: no sore throat, no nodules palpated in throat, no dysphagia/odynophagia, no hoarseness Cardiovascular: no CP/SOB/palpitations/leg swelling Respiratory: no cough/SOB Gastrointestinal: no N/V/D/C Musculoskeletal: no muscle/joint aches Skin: no rashes Neurological: no tremors/numbness/tingling/dizziness Psychiatric: no depression/anxiety  Objective:    BP 115/70 mmHg  Pulse 88  Ht 6' 2.5" (1.892 m)  Wt 257 lb (116.574 kg)  BMI 32.57 kg/m2  SpO2 97%  Wt Readings from Last 3 Encounters:  07/02/15 257 lb (116.574 kg)  12/31/14 261 lb (118.389 kg)  08/31/14 264 lb (119.75 kg)    Physical Exam   Constitutional: overweight, in NAD Eyes: PERRLA, EOMI, no exophthalmos ENT: moist mucous membranes, no thyromegaly, no cervical lymphadenopathy Cardiovascular: RRR, No MRG Respiratory: CTA B Gastrointestinal: abdomen soft, NT, ND, BS+ Musculoskeletal: no deformities, strength intact in all 4 Skin: moist, warm, no rashes Neurological: no tremor with outstretched hands, DTR normal in all 4  Results for orders placed or performed in visit on [id number]  Basic metabolic panel  Result Value Ref Range   Glucose 126 (H) 65 - 99 mg/dL   BUN 27 8 - 27 mg/dL   Creatinine, Ser 2.13 (H) 0.76 - 1.27 mg/dL   GFR calc non Af Amer 29 (L) >59 mL/min/1.73   GFR calc Af Amer 34 (L) >59 mL/min/1.73   BUN/Creatinine Ratio 13 10 - 24   Sodium 143 134 - 144 mmol/L   Potassium 4.2 3.5 - 5.2 mmol/L   Chloride 105 96 - 106 mmol/L   CO2 19 18 - 29 mmol/L   Calcium 9.3 8.6 - 10.2 mg/dL  PTH, intact and calcium  Result Value Ref Range   PTH 33 15 - 65 pg/mL   PTH Comment   T4, free  Result Value Ref Range   Free T4 1.30 0.82 - 1.77 ng/dL  TSH  Result Value Ref  Range   TSH 2.240 0.450 - 4.500 uIU/mL    Lipid Panel     Component Value Date/Time   CHOL  02/04/2009 0538    142        ATP III CLASSIFICATION:  <200     mg/dL   Desirable  200-239  mg/dL   Borderline High  >=240    mg/dL   High          TRIG 122 02/04/2009 0538   HDL 33* 02/04/2009 0538   CHOLHDL 4.3 02/04/2009 0538   VLDL 24 02/04/2009 0538   LDLCALC  02/04/2009 0538    85        Total Cholesterol/HDL:CHD Risk Coronary Heart Disease Risk Table  Men   Women  1/2 Average Risk   3.4   3.3  Average Risk       5.0   4.4  2 X Average Risk   9.6   7.1  3 X Average Risk  23.4   11.0        Use the calculated Patient Ratio above and the CHD Risk Table to determine the patient's CHD Risk.        ATP III CLASSIFICATION (LDL):  <100     mg/dL   Optimal  100-129  mg/dL   Near or Above                    Optimal  130-159  mg/dL   Borderline  160-189  mg/dL   High  >190     mg/dL   Very High      Assessment & Plan:   1.  hypothyroidism  His recent TFTs are appropriate. He will be continued on  Levothyroxine 50 mcg po qam.    - We discussed about correct intake of levothyroxine, at fasting, with water, separated by at least 30 minutes from breakfast, and separated by more than 4 hours from calcium, iron, multivitamins, acid reflux medications (PPIs). -Patient is made aware of the fact that thyroid hormone replacement is needed for life, dose to be adjusted by periodic monitoring of thyroid function tests.   2. Tertiary hyperparathyroidism  With Hypocalcemia  His last PTH is better at 33 from 16 ( could be CKD effect) along with calcium of low normal at 9.3. His calcium level is improving. -I advised him to continue daily supplement of calcium along with vitamin D. His 24 hour urine calcium has been  low at 57  3. Vitamin D deficiency  -continue vitamin D 50,000 units weekly for the next 12 weeks.  4. Hypertension: Controlled. I advised him to  continue losartan and carvedilol.  5.  Obesity with Impaired glucose tolerance. At risk for diabetes.  I gave him dietary advice to avoid simple carbs. Will check a1c next visit.  - I advised patient to maintain close follow up with [name], MD for primary care needs. Follow up plan: Return in about 6 months (around 01/01/2016) for follow up with pre-visit labs.  Glade Lloyd, MD Phone: [phone]  Fax: [phone]   07/02/2015, 9:30 AM

## 2015-07-12 DIAGNOSIS — I4891 Unspecified atrial fibrillation: Secondary | ICD-10-CM | POA: Diagnosis not present

## 2015-07-26 DIAGNOSIS — C61 Malignant neoplasm of prostate: Secondary | ICD-10-CM | POA: Diagnosis not present

## 2015-08-05 DIAGNOSIS — I4891 Unspecified atrial fibrillation: Secondary | ICD-10-CM | POA: Diagnosis not present

## 2015-08-16 ENCOUNTER — Encounter: Payer: Self-pay | Admitting: Cardiovascular Disease

## 2015-08-16 ENCOUNTER — Ambulatory Visit (INDEPENDENT_AMBULATORY_CARE_PROVIDER_SITE_OTHER): Payer: PPO | Admitting: Cardiovascular Disease

## 2015-08-16 ENCOUNTER — Encounter (INDEPENDENT_AMBULATORY_CARE_PROVIDER_SITE_OTHER): Payer: Self-pay

## 2015-08-16 VITALS — BP 112/68 | HR 95 | Ht 74.0 in | Wt 253.6 lb

## 2015-08-16 DIAGNOSIS — I482 Chronic atrial fibrillation, unspecified: Secondary | ICD-10-CM

## 2015-08-16 DIAGNOSIS — R0602 Shortness of breath: Secondary | ICD-10-CM | POA: Diagnosis not present

## 2015-08-16 DIAGNOSIS — I10 Essential (primary) hypertension: Secondary | ICD-10-CM

## 2015-08-16 DIAGNOSIS — Z7901 Long term (current) use of anticoagulants: Secondary | ICD-10-CM

## 2015-08-16 DIAGNOSIS — E038 Other specified hypothyroidism: Secondary | ICD-10-CM

## 2015-08-16 NOTE — Progress Notes (Signed)
Cardiology Office Note    Date:  08/16/2015   ID:  [name] [name], DOB 08-27-1940, MRN [id number]  PCP:  [name], MD  Cardiologist:   [name], MD   Chief Complaint  Patient presents with  . Yearly visit    pt c/o SOB on exertion--feels like it is getting worse; no other Sx.    History of Present Illness:  [name] [name] is a 75 y.o. 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 He reports that his shortness of breath is a little worse than it was year ago. It only happens when he is working or walking [place]. He denies orthopnea or PND. He has not had leg edema. He is unaware of palpitations and has not had dizziness or syncope. He has not had any bleeding problems on chronic warfarin anticoagulation. He denies any focal neurological events and has never had a stroke or TIA. 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  A nuclear stress test in 2011 showed reported scar with mild peri-infarct ischemia in the apex and the inferior wall. It was felt to be a low risk scan since no significant ischemia was demonstrated. In 2013 left ventricular ejection fraction by echo was 50-55 percent without regional wall motion abnormalities. Both atria were mildly dilated. No significant valvular abnormalities were reported. 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  Past Medical History:  Diagnosis Date  . AF (atrial fibrillation) ([place])   . Cardiomyopathy ([place])    h/o  . Chronic kidney disease, stage 3   . Dysrhythmia   . Hypothyroidism   . Pneumonia   . Prostate cancer ([place])   . Shortness of breath   . Systemic hypertension     Past Surgical History:  Procedure Laterality Date  . CARDIOVERSION    . CATARACT EXTRACTION  W/PHACO  04/17/2011   Procedure: CATARACT EXTRACTION PHACO AND INTRAOCULAR LENS PLACEMENT (IOC);  Surgeon: [name], MD;  Location: [facility];  Service: Ophthalmology;  Laterality: Left;  CDE:14.58  . CATARACT EXTRACTION W/PHACO  05/22/2011   Procedure: CATARACT EXTRACTION PHACO AND INTRAOCULAR LENS PLACEMENT (IOC);  Surgeon: [name], MD;  Location: [facility];  Service: Ophthalmology;  Laterality: Right;  CDE 13.02  . COLONOSCOPY  01/17/2011   Procedure: COLONOSCOPY;  Surgeon: Jamesetta So;  Location: [facility];  Service: Gastroenterology;  Laterality: N/A;  . NM MYOCAR PERF WALL MOTION  02/2009   dipyridamole; mild perfusion defect due to infarct/scar with mild perinfarct ischemia is apical septal, apical basal inferoseptal, basal inferior, mid inferoseptal mid inferior & apical inferior; EKG negative for ischemia; low risk scan  . PROSTATE SURGERY     7 years ago [facility] Dr Javaid-prostatectomy  . PROSTATECTOMY    . Sleep Study  05/2011   increased upper airway resistance syndrome with mild sleep apnea during REM  . TRANSTHORACIC ECHOCARDIOGRAM  04/2011   EF=50-55%; RV mildly dilated; LA mild-mod dilated; mild mitral valve annular calcif, mild MR; mod TR, RVSP elevated 30-4mmHg, mild pulm HTN; AV mildly sclerotic, mild calcif of AV leaflets; trace pulm valve regurg     Current Medications: Outpatient Medications Prior to Visit  Medication Sig Dispense Refill  . Calcium Citrate-Vitamin D (CALCIUM + D PO) Take by mouth daily.    . carvedilol (COREG)  25 MG tablet Take 2 tablets (50 mg total) by mouth 2 (two) times daily. 180 tablet 3  . furosemide (LASIX) 20 MG tablet Take 1 tablet (20 mg total) by mouth daily. 30 tablet 0  . levothyroxine (SYNTHROID, LEVOTHROID) 50 MCG tablet Take 1 tablet (50 mcg total) by mouth every morning. 30 tablet 6  . losartan (COZAAR) 25 MG tablet Take 25 mg by mouth daily.    . Vitamin D, Ergocalciferol, (DRISDOL) 50000 UNITS CAPS capsule Take 1 capsule (50,000 Units  total) by mouth every 7 (seven) days. 12 capsule 0  . warfarin (COUMADIN) 4 MG tablet Take 4 mg by mouth daily. Alternate 4mg  with 2mg      Facility-Administered Medications Prior to Visit  Medication Dose Route Frequency Provider Last Rate Last Dose  . neomycin-polymyxin-dexameth (MAXITROL) 0.1 % ophth ointment    PRN [name], MD   1 application at [id number] [phone]     Allergies:   Review of patient's allergies indicates no known allergies.   Social History   Social History  . Marital status: Single    Spouse name: N/A  . Number of children: 2  . Years of education: N/A   Social History Main Topics  . Smoking status: Never Smoker  . Smokeless tobacco: None  . Alcohol use No  . Drug use: No  . Sexual activity: Yes    Birth control/ protection: None   Other Topics Concern  . None   Social History Narrative  . None     Family History:  The patient's  family history includes Diabetes in his mother; Prostate cancer in his father.   ROS:   Please see the history of present illness.    ROS All other systems reviewed and are negative.   PHYSICAL EXAM:   VS:  BP 112/68 (BP Location: Left Arm, Patient Position: Sitting, Cuff Size: Normal)   Pulse 95   Ht 6\' 2"  (1.88 m)   Wt 253 lb 9.6 oz (115 kg)   BMI 32.56 kg/m    GEN: Well nourished, well developed, in no acute distress  HEENT: normal  Neck: no JVD, carotid bruits, or masses Cardiac: Irregular, no murmurs, rubs, or gallops,no edema  Respiratory:  clear to auscultation bilaterally, normal work of breathing GI: soft, nontender, nondistended, + BS MS: no deformity or atrophy  Skin: warm and dry, no rash Neuro:  Alert and Oriented x 3, Strength and sensation are intact Psych: euthymic mood, full affect  Wt Readings from Last 3 Encounters:  08/16/15 253 lb 9.6 oz (115 kg)  07/02/15 257 lb (116.6 kg)  12/31/14 261 lb (118.4 kg)      Studies/Labs Reviewed:   EKG:  EKG is ordered today.  The ekg ordered today  demonstrates Atrial fibrillation and left anterior fascicular block  Recent Labs: 12/23/2014: ALT 16 06/24/2015: BUN 27; Creatinine, Ser 2.13; Potassium 4.2; Sodium 143; TSH 2.240   Lipid Panel    Component Value Date/Time   CHOL  02/04/2009 0538    142        ATP III CLASSIFICATION:  <200     mg/dL   Desirable  200-239  mg/dL   Borderline High  >=240    mg/dL   High          TRIG 122 02/04/2009 0538   HDL 33 (L) 02/04/2009 0538   CHOLHDL 4.3 02/04/2009 0538   VLDL 24 02/04/2009 0538   LDLCALC  02/04/2009 0538    85  Total Cholesterol/HDL:CHD Risk Coronary Heart Disease Risk Table                     Men   Women  1/2 Average Risk   3.4   3.3  Average Risk       5.0   4.4  2 X Average Risk   9.6   7.1  3 X Average Risk  23.4   11.0        Use the calculated Patient Ratio above and the CHD Risk Table to determine the patient's CHD Risk.        ATP III CLASSIFICATION (LDL):  <100     mg/dL   Optimal  100-129  mg/dL   Near or Above                    Optimal  130-159  mg/dL   Borderline  160-189  mg/dL   High  >190     mg/dL   Very High    ASSESSMENT:    1. Chronic atrial fibrillation ([place])   2. Essential hypertension   3. Shortness of breath   4. Long term current use of anticoagulant therapy   5. Other specified hypothyroidism      PLAN:  In order of problems listed above:  1. AFib: Rate control appears borderline at rest. Will check an echocardiogram to make sure there is no evidence of recurrent cardiomyopathy. He is already on a maximum usual dose of carvedilol and his blood pressure is relatively low. If more rate control as necessary with probably have to switch to metoprolol for better rate control for same blood pressure reduction. Digoxin may not be a good idea due to his renal problems 2. HTN: Excellent control 3. Dyspnea: Recheck echo. Obesity is likely part of the problem 4. Warfarin: Monitored in [place] by his primary care provider. No  history of stroke/TIA in no bleeding complications 5. Hypothyroidism: Need to get report on his most recent TSH. Excessive thyroid hormone supplementation could explain faster heart rate    Medication Adjustments/Labs and Tests Ordered: Current medicines are reviewed at length with the patient today.  Concerns regarding medicines are outlined above.  Medication changes, Labs and Tests ordered today are listed in the Patient Instructions below. Patient Instructions  Medication Instructions: Dr Sallyanne Kuster recommends that you continue on your current medications as directed. Please refer to the Current Medication list given to you today.  Labwork: NONE ORDERED  Testing/Procedures: 1. Echocardiogram - Your physician has requested that you have an echocardiogram. Echocardiography is a painless test that uses sound waves to create images of your heart. It provides your doctor with information about the size and shape of your heart and how well your heart's chambers and valves are working. This procedure takes approximately one hour. There are no restrictions for this procedure. This will be done at our [facility] location - [address].  Follow-up: Dr Sallyanne Kuster recommends that you schedule a follow-up appointment in 1 year. You will receive a reminder letter in the mail two months in advance. If you don't receive a letter, please call our office to schedule the follow-up appointment.  If you need a refill on your cardiac medications before your next appointment, please call your pharmacy.    Signed, [name], MD  08/16/2015 8:48 AM    [facility] [address], [place], [place]  [address] Phone: [phone]; Fax: [phone]   

## 2015-08-16 NOTE — Patient Instructions (Signed)
Medication Instructions: Dr Sallyanne Kuster recommends that you continue on your current medications as directed. Please refer to the Current Medication list given to you today.  Labwork: NONE ORDERED  Testing/Procedures: 1. Echocardiogram - Your physician has requested that you have an echocardiogram. Echocardiography is a painless test that uses sound waves to create images of your heart. It provides your doctor with information about the size and shape of your heart and how well your heart's chambers and valves are working. This procedure takes approximately one hour. There are no restrictions for this procedure. This will be done at our [facility] location - [address], [address].  Follow-up: Dr Sallyanne Kuster recommends that you schedule a follow-up appointment in 1 year. You will receive a reminder letter in the mail two months in advance. If you don't receive a letter, please call our office to schedule the follow-up appointment.  If you need a refill on your cardiac medications before your next appointment, please call your pharmacy.

## 2015-08-26 ENCOUNTER — Encounter (HOSPITAL_COMMUNITY): Payer: Self-pay | Admitting: Radiology

## 2015-08-26 NOTE — Progress Notes (Signed)
LMOM regarding echo appointment time, location, and no show/late cancellation fee

## 2015-08-30 ENCOUNTER — Other Ambulatory Visit: Payer: Self-pay

## 2015-08-30 ENCOUNTER — Ambulatory Visit (HOSPITAL_COMMUNITY): Payer: PPO | Attending: Cardiovascular Disease

## 2015-08-30 DIAGNOSIS — R0602 Shortness of breath: Secondary | ICD-10-CM | POA: Diagnosis not present

## 2015-08-30 DIAGNOSIS — I119 Hypertensive heart disease without heart failure: Secondary | ICD-10-CM | POA: Diagnosis not present

## 2015-08-30 DIAGNOSIS — I071 Rheumatic tricuspid insufficiency: Secondary | ICD-10-CM | POA: Insufficient documentation

## 2015-08-30 DIAGNOSIS — I340 Nonrheumatic mitral (valve) insufficiency: Secondary | ICD-10-CM | POA: Insufficient documentation

## 2015-08-30 DIAGNOSIS — R0600 Dyspnea, unspecified: Secondary | ICD-10-CM | POA: Diagnosis not present

## 2015-09-15 ENCOUNTER — Encounter (HOSPITAL_COMMUNITY): Payer: Self-pay | Admitting: *Deleted

## 2015-09-15 ENCOUNTER — Emergency Department (HOSPITAL_COMMUNITY)
Admission: EM | Admit: 2015-09-15 | Discharge: 2015-09-15 | Disposition: A | Discharge status: Home / Self Care | Payer: PPO | Source: Home / Self Care | Attending: Emergency Medicine | Admitting: Emergency Medicine

## 2015-09-15 DIAGNOSIS — R109 Unspecified abdominal pain: Secondary | ICD-10-CM

## 2015-09-15 DIAGNOSIS — Z7901 Long term (current) use of anticoagulants: Secondary | ICD-10-CM

## 2015-09-15 DIAGNOSIS — I482 Chronic atrial fibrillation, unspecified: Secondary | ICD-10-CM

## 2015-09-15 DIAGNOSIS — N183 Chronic kidney disease, stage 3 (moderate): Secondary | ICD-10-CM | POA: Insufficient documentation

## 2015-09-15 DIAGNOSIS — E860 Dehydration: Secondary | ICD-10-CM | POA: Insufficient documentation

## 2015-09-15 DIAGNOSIS — E039 Hypothyroidism, unspecified: Secondary | ICD-10-CM | POA: Insufficient documentation

## 2015-09-15 DIAGNOSIS — N289 Disorder of kidney and ureter, unspecified: Secondary | ICD-10-CM

## 2015-09-15 DIAGNOSIS — Z79899 Other long term (current) drug therapy: Secondary | ICD-10-CM | POA: Insufficient documentation

## 2015-09-15 DIAGNOSIS — R112 Nausea with vomiting, unspecified: Secondary | ICD-10-CM | POA: Diagnosis not present

## 2015-09-15 DIAGNOSIS — I129 Hypertensive chronic kidney disease with stage 1 through stage 4 chronic kidney disease, or unspecified chronic kidney disease: Secondary | ICD-10-CM | POA: Insufficient documentation

## 2015-09-15 LAB — URINALYSIS, ROUTINE W REFLEX MICROSCOPIC
Bilirubin Urine: NEGATIVE
GLUCOSE, UA: NEGATIVE mg/dL
Ketones, ur: NEGATIVE mg/dL
Leukocytes, UA: NEGATIVE
Nitrite: NEGATIVE
PH: 5.5 (ref 5.0–8.0)
Protein, ur: 100 mg/dL — AB
SPECIFIC GRAVITY, URINE: 1.02 (ref 1.005–1.030)

## 2015-09-15 LAB — COMPREHENSIVE METABOLIC PANEL
ALBUMIN: 4.5 g/dL (ref 3.5–5.0)
ALT: 18 U/L (ref 17–63)
ANION GAP: 18 — AB (ref 5–15)
AST: 21 U/L (ref 15–41)
Alkaline Phosphatase: 49 U/L (ref 38–126)
BUN: 41 mg/dL — ABNORMAL HIGH (ref 6–20)
CHLORIDE: 88 mmol/L — AB (ref 101–111)
CO2: 32 mmol/L (ref 22–32)
Calcium: 10.8 mg/dL — ABNORMAL HIGH (ref 8.9–10.3)
Creatinine, Ser: 2.93 mg/dL — ABNORMAL HIGH (ref 0.61–1.24)
GFR calc non Af Amer: 19 mL/min — ABNORMAL LOW (ref >60)
GFR, EST AFRICAN AMERICAN: 23 mL/min — AB (ref >60)
GLUCOSE: 148 mg/dL — AB (ref 65–99)
POTASSIUM: 4 mmol/L (ref 3.5–5.1)
SODIUM: 138 mmol/L (ref 135–145)
Total Bilirubin: 1.1 mg/dL (ref 0.3–1.2)
Total Protein: 8.1 g/dL (ref 6.5–8.1)

## 2015-09-15 LAB — CBC
HEMATOCRIT: 44.8 % (ref 39.0–52.0)
HEMOGLOBIN: 15.4 g/dL (ref 13.0–17.0)
MCH: 32.6 pg (ref 26.0–34.0)
MCHC: 34.4 g/dL (ref 30.0–36.0)
MCV: 94.9 fL (ref 78.0–100.0)
Platelets: 256 10*3/uL (ref 150–400)
RBC: 4.72 MIL/uL (ref 4.22–5.81)
RDW: 12.9 % (ref 11.5–15.5)
WBC: 10.3 10*3/uL (ref 4.0–10.5)

## 2015-09-15 LAB — PROTIME-INR
INR: 2.59
Prothrombin Time: 28.2 seconds — ABNORMAL HIGH (ref 11.4–15.2)

## 2015-09-15 LAB — TROPONIN I

## 2015-09-15 LAB — LIPASE, BLOOD: LIPASE: 32 U/L (ref 11–51)

## 2015-09-15 LAB — URINE MICROSCOPIC-ADD ON

## 2015-09-15 MED ORDER — SODIUM CHLORIDE 0.9 % IV BOLUS (SEPSIS)
1000.0000 mL | Freq: Once | INTRAVENOUS | Status: AC
  Administered 2015-09-15: 1000 mL via INTRAVENOUS

## 2015-09-15 MED ORDER — CARVEDILOL 12.5 MG PO TABS
37.5000 mg | ORAL_TABLET | Freq: Once | ORAL | Status: AC
  Administered 2015-09-15: 37.5 mg via ORAL
  Filled 2015-09-15: qty 3

## 2015-09-15 MED ORDER — ONDANSETRON HCL 4 MG/2ML IJ SOLN
4.0000 mg | Freq: Once | INTRAMUSCULAR | Status: AC
Start: 2015-09-15 — End: 2015-09-15
  Administered 2015-09-15: 4 mg via INTRAVENOUS
  Filled 2015-09-15: qty 2

## 2015-09-15 NOTE — ED Provider Notes (Signed)
[place] DEPT Provider Note   CSN: [id number] Arrival date & time: 09/15/15  1543     History   Chief Complaint Chief Complaint  Patient presents with  . Emesis    HPI [name] [name] is a 75 y.o. male.  The history is provided by the patient and a relative.  Emesis   This is a new problem. The current episode started more than 2 days ago. The problem occurs 2 to 4 times per day. The problem has been gradually worsening. The emesis has an appearance of stomach contents. There has been no fever. Associated symptoms include abdominal pain. Pertinent negatives include no diarrhea. Risk factors include suspect food intake.  Patient presents with vomiting He reports about 3 days ago he had food from "[facility]" Soon after he developed nausea/vomiting (nonbloody) He has abd pain only with vomiting No fever No diarrhea Mild CP only with vomiting No new meds No other complaints  Past Medical History:  Diagnosis Date  . AF (atrial fibrillation) ([place])   . Cardiomyopathy ([place])    h/o  . Chronic kidney disease, stage 3   . Dysrhythmia   . Hypothyroidism   . Pneumonia   . Prostate cancer ([place])   . Shortness of breath   . Systemic hypertension     Patient Active Problem List   Diagnosis Date Noted  . Other specified hypothyroidism 12/31/2014  . Hypocalcemia 12/31/2014  . Vitamin D deficiency 12/31/2014  . 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[place]) 08/31/2014  . HTN (hypertension) 02/11/2013  . CKD (chronic kidney disease) stage 3, GFR 30-59 ml/min 02/11/2013  . UARS (upper airway resistance syndrome) 02/11/2013  . Atrial fibrillation ([place]) 03/15/2012  . Long term current use of anticoagulant therapy 03/15/2012    Past Surgical History:  Procedure Laterality Date  . CARDIOVERSION    . CATARACT EXTRACTION W/PHACO  04/17/2011   Procedure: CATARACT EXTRACTION PHACO AND INTRAOCULAR LENS PLACEMENT (IOC);  Surgeon: [name], MD;  Location: [facility];  Service: Ophthalmology;   Laterality: Left;  CDE:14.58  . CATARACT EXTRACTION W/PHACO  05/22/2011   Procedure: CATARACT EXTRACTION PHACO AND INTRAOCULAR LENS PLACEMENT (IOC);  Surgeon: [name], MD;  Location: [facility];  Service: Ophthalmology;  Laterality: Right;  CDE 13.02  . COLONOSCOPY  01/17/2011   Procedure: COLONOSCOPY;  Surgeon: Jamesetta So;  [facility];  Service: Gastroenterology;  Laterality: N/A;  . NM MYOCAR PERF WALL MOTION  02/2009   dipyridamole; mild perfusion defect due to infarct/scar with mild perinfarct ischemia is apical septal, apical basal inferoseptal, basal inferior, mid inferoseptal mid inferior & apical inferior; EKG negative for ischemia; low risk scan  . PROSTATE SURGERY     7 years ago [facility] Dr Javaid-prostatectomy  . PROSTATECTOMY    . Sleep Study  05/2011   increased upper airway resistance syndrome with mild sleep apnea during REM  . TRANSTHORACIC ECHOCARDIOGRAM  04/2011   EF=50-55%; RV mildly dilated; LA mild-mod dilated; mild mitral valve annular calcif, mild MR; mod TR, RVSP elevated 30-45mmHg, mild pulm HTN; AV mildly sclerotic, mild calcif of AV leaflets; trace pulm valve regurg        Home Medications    Prior to Admission medications   Medication Sig Start Date End Date Taking? Authorizing Provider  Calcium Citrate-Vitamin D (CALCIUM + D PO) Take by mouth daily.    Historical Provider, MD  carvedilol (COREG) 25 MG tablet Take 2 tablets (50 mg total) by mouth 2 (two) times daily. 03/02/14   Mihai  Croitoru, MD  furosemide (LASIX) 20 MG tablet Take 1 tablet (20 mg total) by mouth daily. 06/08/15   Mihai Croitoru, MD  levothyroxine (SYNTHROID, LEVOTHROID) 50 MCG tablet Take 1 tablet (50 mcg total) by mouth every morning. 07/02/15   [name], MD  losartan (COZAAR) 25 MG tablet Take 25 mg by mouth daily.    Historical Provider, MD  Vitamin D, Ergocalciferol, (DRISDOL) 50000 UNITS CAPS capsule Take 1 capsule (50,000 Units total) by mouth every 7 (seven) days.  12/31/14   [name], MD  warfarin (COUMADIN) 1 MG tablet Take 1 tablet by mouth once daily with the 3 mg tablet to total 4 mg daily. 06/20/15   Historical Provider, MD  warfarin (COUMADIN) 3 MG tablet Take 1 tablet by mouth daily. 08/01/15   Historical Provider, MD    Family History Family History  Problem Relation Age of Onset  . Diabetes Mother   . Prostate cancer Father   . Anesthesia problems Neg Hx   . Hypotension Neg Hx   . Malignant hyperthermia Neg Hx   . Pseudochol deficiency Neg Hx     Social History Social History  Substance Use Topics  . Smoking status: Never Smoker  . Smokeless tobacco: Never Used  . Alcohol use No     Allergies   Review of patient's allergies indicates no known allergies.   Review of Systems Review of Systems  Gastrointestinal: Positive for abdominal pain and vomiting. Negative for diarrhea.  All other systems reviewed and are negative.    Physical Exam Updated Vital Signs BP 92/73   Pulse (!) 54   Temp 97.4 F (36.3 C) (Temporal)   Resp 18   Ht 6' (1.829 m)   Wt 113.4 kg   SpO2 96%   BMI 33.91 kg/m   Physical Exam  CONSTITUTIONAL: Well developed/well nourished HEAD: Normocephalic/atraumatic EYES: EOMI/PERRL ENMT: Mucous membranes dry, poor dentition NECK: supple no meningeal signs SPINE/BACK:entire spine nontender CV: irregular, no loud murmurs noted, tachycardic LUNGS: Lungs are clear to auscultation bilaterally, no apparent distress ABDOMEN: soft, nontender, no rebound or guarding, bowel sounds noted throughout abdomen, he is obese GU:no cva tenderness NEURO: Pt is awake/alert/appropriate, moves all extremitiesx4.  No facial droop.   EXTREMITIES: pulses normal/equal, full ROM SKIN: warm, color normal PSYCH: no abnormalities of mood noted, alert and oriented to situation  ED Treatments / Results  Labs (all labs ordered are listed, but only abnormal results are displayed) Labs Reviewed  COMPREHENSIVE  METABOLIC PANEL - Abnormal; Notable for the following:       Result Value   Chloride 88 (*)    Glucose, Bld 148 (*)    BUN 41 (*)    Creatinine, Ser 2.93 (*)    Calcium 10.8 (*)    GFR calc non Af Amer 19 (*)    GFR calc Af Amer 23 (*)    Anion gap 18 (*)    All other components within normal limits  URINALYSIS, ROUTINE W REFLEX MICROSCOPIC (NOT AT [facility]) - Abnormal; Notable for the following:    Hgb urine dipstick SMALL (*)    Protein, ur 100 (*)    All other components within normal limits  PROTIME-INR - Abnormal; Notable for the following:    Prothrombin Time 28.2 (*)    All other components within normal limits  URINE MICROSCOPIC-ADD ON - Abnormal; Notable for the following:    Squamous Epithelial / LPF 6-30 (*)    Bacteria, UA MANY (*)  Casts HYALINE CASTS (*)    All other components within normal limits  LIPASE, BLOOD  CBC  TROPONIN I    EKG  EKG Interpretation  Date/Time:  Wednesday September 15 2015 16:41:13 EDT Ventricular Rate:  124 PR Interval:    QRS Duration: 95 QT Interval:  321 QTC Calculation: 461 R Axis:   -72 Text Interpretation:  Atrial fibrillation Left anterior fascicular block Abnormal R-wave progression, early transition Minimal ST depression, inferior leads Baseline wander in lead(s) V3 Confirmed by Christy Gentles  MD, Mia Winthrop ([id number]) on 09/15/2015 4:49:21 PM       Radiology No results found.  Procedures Procedures (including critical care time)  Medications Ordered in ED Medications  ondansetron (ZOFRAN) injection 4 mg (4 mg Intravenous Given 09/15/15 1647)  sodium chloride 0.9 % bolus 1,000 mL (0 mLs Intravenous Stopped 09/15/15 1808)  sodium chloride 0.9 % bolus 1,000 mL (1,000 mLs Intravenous New Bag/Given 09/15/15 1827)  carvedilol (COREG) tablet 37.5 mg (37.5 mg Oral Given 09/15/15 1831)     Initial Impression / Assessment and Plan / ED Course  I have reviewed the triage vital signs and the nursing notes.  Pertinent labs & imaging results  that were available during my care of the patient were reviewed by me and considered in my medical decision making (see chart for details).  Clinical Course    Pt here for vomiting for past several days He is well appearing Abdomen soft and no focal tenderness He thinks this is all due to meal from restaurant He has worsening renal function He is mildly tachycardic but did not take evening coreg This was ordered with IV fluids  8:20 PM Pt improved He is smiling and watching TV He is taking PO fluids He denies any complaints Will d/c home  PLAN = stop lasix until 9/11 Hold cozaar at least 3 days He needs to have creatinine rechecked early next week His worsening renal insufficiency likely due to vomiting We discussed strict return precautions BP 111/69   Pulse 102   Temp 97.4 F (36.3 C) (Temporal)   Resp 17   Ht 6' (1.829 m)   Wt 113.4 kg   SpO2 97%   BMI 33.91 kg/m    This patients CHA2DS2-VASc Score and unadjusted Ischemic Stroke Rate (% per year) is equal to 3.2 % stroke rate/year from a score of 3  Above score calculated as 1 point each if present [CHF, HTN, DM, Vascular=MI/PAD/Aortic Plaque, Age if 65-74, or Male] Above score calculated as 2 points each if present [Age > 75, or Stroke/TIA/TE]   Final Clinical Impressions(s) / ED Diagnoses   Final diagnoses:  Non-intractable vomiting with nausea, vomiting of unspecified type  Dehydration  Renal insufficiency  Chronic atrial fibrillation [facility])    New Prescriptions New Prescriptions   No medications on file     Ripley Fraise, MD 09/15/15 2022

## 2015-09-15 NOTE — Discharge Instructions (Signed)
PLEASE STOP LASIX UNTIL Monday (FLUID PILL) PLEASE HOLD YOUR COZAAR FOR AT LEAST 3 DAYS (BLOOD PRESSURE MEDICINE) PLEASE CONTINUE TO TAKE COREG TWICE A DAY AS SCHEDULED  PLEASE HAVE DR Hilma Favors RECHECK YOUR CREATININE (KIDNEY FUNCTION) ON Monday AND IF IMPROVED YOU CAN GO BACK ON LASIX

## 2015-09-15 NOTE — ED Notes (Signed)
Pt made aware a urine specimen was needed. Pt verbalized understanding and will notify staff when one can be obtained.   

## 2015-09-15 NOTE — ED Triage Notes (Signed)
Pt states he starting vomiting Monday night and can't keep food down since then. Denies any abdominal pain, denies any diarrhea. Last BM was on Sunday.

## 2015-09-18 ENCOUNTER — Inpatient Hospital Stay (HOSPITAL_COMMUNITY)
Admission: EM | Admit: 2015-09-18 | Discharge: 2015-10-06 | DRG: 329 | Disposition: A | Discharge status: Skilled Nursing Facility | Payer: PPO | Attending: Family Medicine | Admitting: Family Medicine

## 2015-09-18 ENCOUNTER — Inpatient Hospital Stay (HOSPITAL_COMMUNITY): Payer: PPO

## 2015-09-18 ENCOUNTER — Encounter (HOSPITAL_COMMUNITY): Payer: Self-pay

## 2015-09-18 DIAGNOSIS — R112 Nausea with vomiting, unspecified: Secondary | ICD-10-CM

## 2015-09-18 DIAGNOSIS — I4891 Unspecified atrial fibrillation: Secondary | ICD-10-CM | POA: Diagnosis present

## 2015-09-18 DIAGNOSIS — D649 Anemia, unspecified: Secondary | ICD-10-CM | POA: Diagnosis present

## 2015-09-18 DIAGNOSIS — Z7901 Long term (current) use of anticoagulants: Secondary | ICD-10-CM | POA: Diagnosis not present

## 2015-09-18 DIAGNOSIS — I482 Chronic atrial fibrillation: Secondary | ICD-10-CM | POA: Diagnosis not present

## 2015-09-18 DIAGNOSIS — E860 Dehydration: Secondary | ICD-10-CM | POA: Diagnosis present

## 2015-09-18 DIAGNOSIS — Z8042 Family history of malignant neoplasm of prostate: Secondary | ICD-10-CM

## 2015-09-18 DIAGNOSIS — E872 Acidosis: Secondary | ICD-10-CM | POA: Diagnosis not present

## 2015-09-18 DIAGNOSIS — E669 Obesity, unspecified: Secondary | ICD-10-CM | POA: Diagnosis present

## 2015-09-18 DIAGNOSIS — E876 Hypokalemia: Secondary | ICD-10-CM | POA: Diagnosis not present

## 2015-09-18 DIAGNOSIS — K4030 Unilateral inguinal hernia, with obstruction, without gangrene, not specified as recurrent: Secondary | ICD-10-CM | POA: Diagnosis present

## 2015-09-18 DIAGNOSIS — N179 Acute kidney failure, unspecified: Secondary | ICD-10-CM | POA: Diagnosis not present

## 2015-09-18 DIAGNOSIS — I10 Essential (primary) hypertension: Secondary | ICD-10-CM | POA: Diagnosis present

## 2015-09-18 DIAGNOSIS — N170 Acute kidney failure with tubular necrosis: Secondary | ICD-10-CM | POA: Diagnosis present

## 2015-09-18 DIAGNOSIS — Z833 Family history of diabetes mellitus: Secondary | ICD-10-CM | POA: Diagnosis not present

## 2015-09-18 DIAGNOSIS — I951 Orthostatic hypotension: Secondary | ICD-10-CM | POA: Diagnosis not present

## 2015-09-18 DIAGNOSIS — R1110 Vomiting, unspecified: Secondary | ICD-10-CM | POA: Diagnosis not present

## 2015-09-18 DIAGNOSIS — I9581 Postprocedural hypotension: Secondary | ICD-10-CM | POA: Diagnosis not present

## 2015-09-18 DIAGNOSIS — E43 Unspecified severe protein-calorie malnutrition: Secondary | ICD-10-CM | POA: Insufficient documentation

## 2015-09-18 DIAGNOSIS — Z9181 History of falling: Secondary | ICD-10-CM

## 2015-09-18 DIAGNOSIS — Z8546 Personal history of malignant neoplasm of prostate: Secondary | ICD-10-CM

## 2015-09-18 DIAGNOSIS — R404 Transient alteration of awareness: Secondary | ICD-10-CM | POA: Diagnosis not present

## 2015-09-18 DIAGNOSIS — K5909 Other constipation: Secondary | ICD-10-CM | POA: Diagnosis not present

## 2015-09-18 DIAGNOSIS — K56609 Unspecified intestinal obstruction, unspecified as to partial versus complete obstruction: Secondary | ICD-10-CM | POA: Diagnosis present

## 2015-09-18 DIAGNOSIS — E039 Hypothyroidism, unspecified: Secondary | ICD-10-CM | POA: Diagnosis not present

## 2015-09-18 DIAGNOSIS — N178 Other acute kidney failure: Secondary | ICD-10-CM

## 2015-09-18 DIAGNOSIS — D6832 Hemorrhagic disorder due to extrinsic circulating anticoagulants: Secondary | ICD-10-CM | POA: Diagnosis present

## 2015-09-18 DIAGNOSIS — N390 Urinary tract infection, site not specified: Secondary | ICD-10-CM | POA: Diagnosis not present

## 2015-09-18 DIAGNOSIS — N183 Chronic kidney disease, stage 3 unspecified: Secondary | ICD-10-CM | POA: Diagnosis present

## 2015-09-18 DIAGNOSIS — R509 Fever, unspecified: Secondary | ICD-10-CM

## 2015-09-18 DIAGNOSIS — Q998 Other specified chromosome abnormalities: Secondary | ICD-10-CM

## 2015-09-18 DIAGNOSIS — R0602 Shortness of breath: Secondary | ICD-10-CM

## 2015-09-18 DIAGNOSIS — I429 Cardiomyopathy, unspecified: Secondary | ICD-10-CM | POA: Diagnosis not present

## 2015-09-18 DIAGNOSIS — Z6831 Body mass index (BMI) 31.0-31.9, adult: Secondary | ICD-10-CM | POA: Diagnosis not present

## 2015-09-18 DIAGNOSIS — W19XXXA Unspecified fall, initial encounter: Secondary | ICD-10-CM

## 2015-09-18 DIAGNOSIS — Y92009 Unspecified place in unspecified non-institutional (private) residence as the place of occurrence of the external cause: Secondary | ICD-10-CM

## 2015-09-18 DIAGNOSIS — F22 Delusional disorders: Secondary | ICD-10-CM

## 2015-09-18 DIAGNOSIS — E875 Hyperkalemia: Secondary | ICD-10-CM | POA: Diagnosis not present

## 2015-09-18 DIAGNOSIS — R739 Hyperglycemia, unspecified: Secondary | ICD-10-CM | POA: Diagnosis present

## 2015-09-18 DIAGNOSIS — I129 Hypertensive chronic kidney disease with stage 1 through stage 4 chronic kidney disease, or unspecified chronic kidney disease: Secondary | ICD-10-CM | POA: Diagnosis not present

## 2015-09-18 DIAGNOSIS — N189 Chronic kidney disease, unspecified: Secondary | ICD-10-CM | POA: Diagnosis present

## 2015-09-18 DIAGNOSIS — R531 Weakness: Secondary | ICD-10-CM | POA: Diagnosis not present

## 2015-09-18 DIAGNOSIS — N5089 Other specified disorders of the male genital organs: Secondary | ICD-10-CM | POA: Diagnosis not present

## 2015-09-18 DIAGNOSIS — T45515A Adverse effect of anticoagulants, initial encounter: Secondary | ICD-10-CM | POA: Diagnosis not present

## 2015-09-18 DIAGNOSIS — M6281 Muscle weakness (generalized): Secondary | ICD-10-CM

## 2015-09-18 DIAGNOSIS — K5900 Constipation, unspecified: Secondary | ICD-10-CM | POA: Diagnosis present

## 2015-09-18 LAB — COMPREHENSIVE METABOLIC PANEL
ALBUMIN: 4 g/dL (ref 3.5–5.0)
ALK PHOS: 44 U/L (ref 38–126)
ALT: 22 U/L (ref 17–63)
ANION GAP: 21 — AB (ref 5–15)
AST: 24 U/L (ref 15–41)
BUN: 95 mg/dL — AB (ref 6–20)
CALCIUM: 9.8 mg/dL (ref 8.9–10.3)
CO2: 37 mmol/L — AB (ref 22–32)
Chloride: 75 mmol/L — ABNORMAL LOW (ref 101–111)
Creatinine, Ser: 6.53 mg/dL — ABNORMAL HIGH (ref 0.61–1.24)
GFR calc Af Amer: 9 mL/min — ABNORMAL LOW (ref >60)
GFR calc non Af Amer: 7 mL/min — ABNORMAL LOW (ref >60)
GLUCOSE: 142 mg/dL — AB (ref 65–99)
Potassium: 4.4 mmol/L (ref 3.5–5.1)
SODIUM: 133 mmol/L — AB (ref 135–145)
TOTAL PROTEIN: 7.8 g/dL (ref 6.5–8.1)
Total Bilirubin: 1.2 mg/dL (ref 0.3–1.2)

## 2015-09-18 LAB — PROTIME-INR
INR: 4.88
Prothrombin Time: 46.4 seconds — ABNORMAL HIGH (ref 11.4–15.2)

## 2015-09-18 LAB — URINALYSIS, ROUTINE W REFLEX MICROSCOPIC
Glucose, UA: NEGATIVE mg/dL
KETONES UR: NEGATIVE mg/dL
Nitrite: NEGATIVE
PH: 5.5 (ref 5.0–8.0)
Protein, ur: 30 mg/dL — AB

## 2015-09-18 LAB — BASIC METABOLIC PANEL
ANION GAP: 18 — AB (ref 5–15)
BUN: 98 mg/dL — ABNORMAL HIGH (ref 6–20)
CO2: 35 mmol/L — AB (ref 22–32)
Calcium: 8.8 mg/dL — ABNORMAL LOW (ref 8.9–10.3)
Chloride: 81 mmol/L — ABNORMAL LOW (ref 101–111)
Creatinine, Ser: 6.27 mg/dL — ABNORMAL HIGH (ref 0.61–1.24)
GFR calc non Af Amer: 8 mL/min — ABNORMAL LOW (ref >60)
GFR, EST AFRICAN AMERICAN: 9 mL/min — AB (ref >60)
GLUCOSE: 130 mg/dL — AB (ref 65–99)
POTASSIUM: 3.7 mmol/L (ref 3.5–5.1)
Sodium: 134 mmol/L — ABNORMAL LOW (ref 135–145)

## 2015-09-18 LAB — CBC
HEMATOCRIT: 44.2 % (ref 39.0–52.0)
HEMATOCRIT: 40.6 % (ref 39.0–52.0)
HEMOGLOBIN: 15.4 g/dL (ref 13.0–17.0)
HEMOGLOBIN: 13.9 g/dL (ref 13.0–17.0)
MCH: 32.6 pg (ref 26.0–34.0)
MCH: 32.2 pg (ref 26.0–34.0)
MCHC: 34.8 g/dL (ref 30.0–36.0)
MCHC: 34.2 g/dL (ref 30.0–36.0)
MCV: 93.4 fL (ref 78.0–100.0)
MCV: 94 fL (ref 78.0–100.0)
Platelets: 249 10*3/uL (ref 150–400)
Platelets: 223 10*3/uL (ref 150–400)
RBC: 4.73 MIL/uL (ref 4.22–5.81)
RBC: 4.32 MIL/uL (ref 4.22–5.81)
RDW: 12.6 % (ref 11.5–15.5)
RDW: 12.5 % (ref 11.5–15.5)
WBC: 10.1 10*3/uL (ref 4.0–10.5)
WBC: 9.2 10*3/uL (ref 4.0–10.5)

## 2015-09-18 LAB — URINE MICROSCOPIC-ADD ON

## 2015-09-18 LAB — LACTIC ACID, PLASMA
LACTIC ACID, VENOUS: 1.8 mmol/L (ref 0.5–1.9)
LACTIC ACID, VENOUS: 1.3 mmol/L (ref 0.5–1.9)

## 2015-09-18 LAB — LIPASE, BLOOD: LIPASE: 66 U/L — AB (ref 11–51)

## 2015-09-18 MED ORDER — DEXTROSE 5 % IV SOLN
1.0000 g | Freq: Once | INTRAVENOUS | Status: AC
  Administered 2015-09-18: 1 g via INTRAVENOUS
  Filled 2015-09-18: qty 10

## 2015-09-18 MED ORDER — SODIUM CHLORIDE 0.9 % IV SOLN
INTRAVENOUS | Status: DC
  Administered 2015-09-18 – 2015-09-22 (×8): via INTRAVENOUS

## 2015-09-18 MED ORDER — ONDANSETRON HCL 4 MG PO TABS: 4.0000 mg | ORAL_TABLET | Freq: Four times a day (QID) | ORAL | Status: DC | PRN

## 2015-09-18 MED ORDER — SODIUM CHLORIDE 0.9 % IV BOLUS (SEPSIS)
1000.0000 mL | Freq: Once | INTRAVENOUS | Status: AC
  Administered 2015-09-18: 1000 mL via INTRAVENOUS

## 2015-09-18 MED ORDER — ONDANSETRON HCL 4 MG/2ML IJ SOLN: 4.0000 mg | Freq: Three times a day (TID) | INTRAMUSCULAR | Status: AC | PRN

## 2015-09-18 MED ORDER — ONDANSETRON HCL 4 MG/2ML IJ SOLN
4.0000 mg | Freq: Four times a day (QID) | INTRAMUSCULAR | Status: DC | PRN
  Administered 2015-09-18 – 2015-09-22 (×6): 4 mg via INTRAVENOUS
  Filled 2015-09-18 (×6): qty 2

## 2015-09-18 MED ORDER — DEXTROSE 5 % IV SOLN
1.0000 g | INTRAVENOUS | Status: AC
  Administered 2015-09-19 – 2015-09-23 (×5): 1 g via INTRAVENOUS
  Filled 2015-09-18 (×5): qty 10

## 2015-09-18 MED ORDER — SODIUM CHLORIDE 0.9 % IV SOLN
INTRAVENOUS | Status: AC
  Administered 2015-09-18: 08:00:00 via INTRAVENOUS

## 2015-09-18 MED ORDER — ONDANSETRON HCL 4 MG/2ML IJ SOLN: 4.0000 mg | Freq: Once | INTRAMUSCULAR | Status: DC | PRN

## 2015-09-18 MED ORDER — ONDANSETRON HCL 4 MG/2ML IJ SOLN
4.0000 mg | Freq: Once | INTRAMUSCULAR | Status: AC
  Administered 2015-09-18: 4 mg via INTRAVENOUS
  Filled 2015-09-18: qty 2

## 2015-09-18 NOTE — Progress Notes (Signed)
Patient seen and examined, database reviewed. Admitted earlier today with vomiting, weakness and a near-syncopal event. Found to have acute on CKD with a Cr>6. Lactic acidosis, due to severe dehydration. Has been hypotensive in the 70s over the last few hours. Saline boluses have been ordered. Plan to continue fluid resuscitation for now. Keep in ICU while remains hypotensive. (SBP increased to 90s with IVF). Will continue to follow closely.  Domingo Mend, MD [facility] Pager: [phone]

## 2015-09-18 NOTE — H&P (Signed)
History and Physical    [name] [name] VHQ:[id number] DOB: [birth year] DOA: 09/18/2015  PCP: [name], MD  Patient coming from:  home  Chief Complaint:   Passing out, vomiting for 5 days, very weak  HPI: [name] [name] is a 75 y.o. male with medical history significant of HTN, CKD baseline cr around 2, afib on coumadin comes in for the second time this week for persistent vomiting.  He came in 2 days ago with cc of vomiting at that time.  He was told to stop taking some of his pills but is not sure which ones.  He went home and continued to vomit.  The vomiting comes right after he experiences suprapubic pain and disfomfort.  He denies any dysuria, no urinaary incontinence.  No hematuria.  No pain above the suprapubic area.  No diarrhea. No constipation.  The pain is not related with eating but is improved after his vomits.  He has had no sick contacts.  Today was very weak and his sons went to check on him and he was too weak to even stand.  They tried to get him up and he passed out when he stood up.  They finally got him into the car to come to the ED.  Pt found to be very dehydrated with worsening renal failure and was referred for admission for such.  Review of Systems: As per HPI otherwise 10 point review of systems negative.   Past Medical History:  Diagnosis Date  . AF (atrial fibrillation) ([place])   . Cardiomyopathy ([place])    h/o  . Chronic kidney disease, stage 3   . Dysrhythmia   . Hypothyroidism   . Pneumonia   . Prostate cancer ([place])   . Shortness of breath   . Systemic hypertension     Past Surgical History:  Procedure Laterality Date  . CARDIOVERSION    . CATARACT EXTRACTION W/PHACO  04/17/2011   Procedure: CATARACT EXTRACTION PHACO AND INTRAOCULAR LENS PLACEMENT (IOC);  Surgeon: [name], MD;  Location: [facility];  Service: Ophthalmology;  Laterality: Left;  CDE:14.58  . CATARACT EXTRACTION W/PHACO  05/22/2011   Procedure: CATARACT EXTRACTION PHACO AND  INTRAOCULAR LENS PLACEMENT (IOC);  Surgeon: [name], MD;  Location: [facility];  Service: Ophthalmology;  Laterality: Right;  CDE 13.02  . COLONOSCOPY  01/17/2011   Procedure: COLONOSCOPY;  Surgeon: Jamesetta So;  Location: [facility];  Service: Gastroenterology;  Laterality: N/A;  . NM MYOCAR PERF WALL MOTION  02/2009   dipyridamole; mild perfusion defect due to infarct/scar with mild perinfarct ischemia is apical septal, apical basal inferoseptal, basal inferior, mid inferoseptal mid inferior & apical inferior; EKG negative for ischemia; low risk scan  . PROSTATE SURGERY     7 years ago [facility] Dr Javaid-prostatectomy  . PROSTATECTOMY    . Sleep Study  05/2011   increased upper airway resistance syndrome with mild sleep apnea during REM  . TRANSTHORACIC ECHOCARDIOGRAM  04/2011   EF=50-55%; RV mildly dilated; LA mild-mod dilated; mild mitral valve annular calcif, mild MR; mod TR, RVSP elevated 30-31mmHg, mild pulm HTN; AV mildly sclerotic, mild calcif of AV leaflets; trace pulm valve regurg      reports that he has never smoked. He has never used smokeless tobacco. He reports that he does not drink alcohol or use drugs.  No Known Allergies  Family History  Problem Relation Age of Onset  . Diabetes Mother   . Prostate cancer Father   .  Anesthesia problems Neg Hx   . Hypotension Neg Hx   . Malignant hyperthermia Neg Hx   . Pseudochol deficiency Neg Hx     Prior to Admission medications   Medication Sig Start Date End Date Taking? Authorizing Provider  acetaminophen (TYLENOL) 500 MG tablet Take 1,000 mg by mouth every 6 (six) hours as needed for mild pain, moderate pain or headache.    Historical Provider, MD  Calcium Carbonate-Vitamin D (CALCIUM 600+D) 600-400 MG-UNIT tablet Take 1 tablet by mouth 2 (two) times daily.    Historical Provider, MD  carvedilol (COREG) 25 MG tablet Take 37.5 mg by mouth 2 (two) times daily with a meal.    Historical Provider, MD  furosemide (LASIX) 20 MG tablet  Take 1 tablet (20 mg total) by mouth daily. 06/08/15   Mihai Croitoru, MD  levothyroxine (SYNTHROID, LEVOTHROID) 50 MCG tablet Take 1 tablet (50 mcg total) by mouth every morning. 07/02/15   [name], MD  losartan (COZAAR) 25 MG tablet Take 25 mg by mouth daily.    Historical Provider, MD  Vitamin D, Ergocalciferol, (DRISDOL) 50000 units CAPS capsule Take 50,000 Units by mouth every 30 (thirty) days.    Historical Provider, MD  warfarin (COUMADIN) 4 MG tablet Take 4 mg by mouth every evening.    Historical Provider, MD    Physical Exam: Vitals:   09/18/15 0200 09/18/15 0230 09/18/15 0400 09/18/15 0430  BP: 113/68 103/60 95/61 92/79   Pulse:  100 77 86  Resp:      Temp:      TempSrc:      SpO2:  (!) 82% 94% 92%  Weight:      Height:        Constitutional: NAD, calm, comfortable MM dry , appears very dry Vitals:   09/18/15 0200 09/18/15 0230 09/18/15 0400 09/18/15 0430  BP: 113/68 103/60 95/61 92/79   Pulse:  100 77 86  Resp:      Temp:      TempSrc:      SpO2:  (!) 82% 94% 92%  Weight:      Height:       Eyes: PERRL, lids and conjunctivae normal ENMT: Mucous membranes are moist. Posterior pharynx clear of any exudate or lesions.Normal dentition.  Neck: normal, supple, no masses, no thyromegaly Respiratory: clear to auscultation bilaterally, no wheezing, no crackles. Normal respiratory effort. No accessory muscle use.  Cardiovascular: Regular rate and rhythm, no murmurs / rubs / gallops. No extremity edema. 2+ pedal pulses. No carotid bruits.  Abdomen: no tenderness, no masses palpated. No hepatosplenomegaly. Bowel sounds positive.  Musculoskeletal: no clubbing / cyanosis. No joint deformity upper and lower extremities. Good ROM, no contractures. Normal muscle tone.  Skin: no rashes, lesions, ulcers. No induration Neurologic: CN 2-12 grossly intact. Sensation intact, DTR normal. Strength 5/5 in all 4.  Psychiatric: Normal judgment and insight. Alert and oriented x 3.  Normal mood.    Labs on Admission: I have personally reviewed following labs and imaging studies  CBC:  Recent Labs Lab 09/15/15 1635 09/18/15 0254  WBC 10.3 10.1  HGB 15.4 15.4  HCT 44.8 44.2  MCV 94.9 93.4  PLT 256 782   Basic Metabolic Panel:  Recent Labs Lab 09/15/15 1635 09/18/15 0254  NA 138 133*  K 4.0 4.4  CL 88* 75*  CO2 32 37*  GLUCOSE 148* 142*  BUN 41* 95*  CREATININE 2.93* 6.53*  CALCIUM 10.8* 9.8   GFR: Estimated Creatinine Clearance: 13.1 mL/min (by  C-G formula based on SCr of 6.53 mg/dL). Liver Function Tests:  Recent Labs Lab 09/15/15 1635 09/18/15 0254  AST 21 24  ALT 18 22  ALKPHOS 49 44  BILITOT 1.1 1.2  PROT 8.1 7.8  ALBUMIN 4.5 4.0    Recent Labs Lab 09/15/15 1635 09/18/15 0254  LIPASE 32 66*   No results for input(s): AMMONIA in the last 168 hours. Coagulation Profile:  Recent Labs Lab 09/15/15 1635 09/18/15 0255  INR 2.59 4.88*   Cardiac Enzymes:  Recent Labs Lab 09/15/15 1635  TROPONINI <0.03   Urine analysis:    Component Value Date/Time   COLORURINE YELLOW 09/18/2015 0225   APPEARANCEUR CLEAR 09/18/2015 0225   LABSPEC >1.030 (H) 09/18/2015 0225   PHURINE 5.5 09/18/2015 0225   GLUCOSEU NEGATIVE 09/18/2015 0225   HGBUR MODERATE (A) 09/18/2015 0225   BILIRUBINUR SMALL (A) 09/18/2015 0225   KETONESUR NEGATIVE 09/18/2015 0225   PROTEINUR 30 (A) 09/18/2015 0225   UROBILINOGEN 0.2 02/03/2009 1406   NITRITE NEGATIVE 09/18/2015 0225   LEUKOCYTESUR TRACE (A) 09/18/2015 0225    Radiological Exams on Admission: No results found.  Assessment/Plan 75 yo male with profound dehydration, AKI, vomiting and possible uti  Principal Problem:   AKI (acute kidney injury) ([place])- likely prerenal and ATN taking diuretics/ARB/coreg.  ua with possible infection, cover with rocephin, check lactic acid level.  Given 2 liters of ivf already will cont at 100cc/hour.  Check orthostatics once admitted.  Rule out serious cuases of  his vomiting/volume losses.  Monitor UOP.  Active Problems:   CKD (chronic kidney disease) stage 3, GFR 30-59 ml/min- noted   Orthostatic syncope- check orthostatics.  Per history and clinical findings likely orthostatic   Atrial fibrillation ([place])- noted   Long term current use of anticoagulant therapy- check INR level   Vomiting with suprapubic discomfort - will obtain abdominal ultrasound to rule out serious issues.  Treat uti.   Dehydration- noted, as above   Cardiomyopathy ([place])- last echo EF 50%    clarify home meds  DVT prophylaxis:   Chronic anticoagulation  Code Status:   full Family Communication:   sons Disposition Plan:  Per day team Consults called:   none Admission status:  admit   Kerrie Timm A MD [facility]  If 7PM-7AM, please contact night-coverage [email or web address] Password [email or web address]  09/18/2015, 5:14 AM

## 2015-09-18 NOTE — ED Triage Notes (Signed)
Patient states he has been having nausea and vomiting X5 days. Patient states he was seen here earlier this week for the same thing. Patient states he has not had a bowel movement this week.

## 2015-09-18 NOTE — ED Provider Notes (Signed)
[name]   CSN: [id number] Arrival date & time: 09/18/15  0149  Time seen 03:15 AM  History   Chief Complaint Chief Complaint  Patient presents with  . Emesis    HPI [name] [name] is a 75 y.o. male.  HPI patient son states patient started getting ill on September 4 with vomiting and not eating. He was seen on September 6 in the ED and got IV fluids. However since he has been home he is not eating or drinking. He started vomiting again tonight after trying to eat. He has fallen twice at home because his legs were too weak to hold him up. Tonight the 2 sons were getting him ready to walk out to the car to come to the ED and one son forgot something and left him with only one son helping him stand up and the patient passed out. He states he was out about 30 seconds and had some confusion afterwards. He states that he is lightheaded when he stands up, he has a dry mouth, he has had decreased urinary output. He states he gets some lower abdominal pain bilaterally when he gets very nauseated. He also states has a history of a hernia and points to his right inguinal area.   PCP Dr Hilma Favors  Past Medical History:  Diagnosis Date  . AF (atrial fibrillation) ([place])   . Cardiomyopathy ([place])    h/o  . Chronic kidney disease, stage 3   . Dysrhythmia   . Hypothyroidism   . Pneumonia   . Prostate cancer ([place])   . Shortness of breath   . Systemic hypertension     Patient Active Problem List   Diagnosis Date Noted  . Orthostatic syncope 09/18/2015  . Vomiting 09/18/2015  . AKI (acute kidney injury) ([place]) 09/18/2015  . Dehydration 09/18/2015  . Acute renal failure (ARF) ([place]) 09/18/2015  . Cardiomyopathy ([place])   . Other specified hypothyroidism 12/31/2014  . Hypocalcemia 12/31/2014  . Vitamin D deficiency 12/31/2014  . 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 HTN (hypertension) 02/11/2013  . CKD (chronic kidney disease) stage 3, GFR 30-59 ml/min  02/11/2013  . UARS (upper airway resistance syndrome) 02/11/2013  . Atrial fibrillation ([place]) 03/15/2012  . Long term current use of anticoagulant therapy 03/15/2012    Past Surgical History:  Procedure Laterality Date  . CARDIOVERSION    . CATARACT EXTRACTION W/PHACO  04/17/2011   Procedure: CATARACT EXTRACTION PHACO AND INTRAOCULAR LENS PLACEMENT (IOC);  Surgeon: [name], MD;  Location: [facility];  Service: Ophthalmology;  Laterality: Left;  CDE:14.58  . CATARACT EXTRACTION W/PHACO  05/22/2011   Procedure: CATARACT EXTRACTION PHACO AND INTRAOCULAR LENS PLACEMENT (IOC);  Surgeon: [name], MD;  Location: [facility];  Service: Ophthalmology;  Laterality: Right;  CDE 13.02  . COLONOSCOPY  01/17/2011   Procedure: COLONOSCOPY;  Surgeon: Jamesetta So;  Location[facility];  Service: Gastroenterology;  Laterality: N/A;  . NM MYOCAR PERF WALL MOTION  02/2009   dipyridamole; mild perfusion defect due to infarct/scar with mild perinfarct ischemia is apical septal, apical basal inferoseptal, basal inferior, mid inferoseptal mid inferior & apical inferior; EKG negative for ischemia; low risk scan  . PROSTATE SURGERY     7 years ago [facility] Dr Javaid-prostatectomy  . PROSTATECTOMY    . Sleep Study  05/2011   increased upper airway resistance syndrome with mild sleep apnea during REM  . TRANSTHORACIC ECHOCARDIOGRAM  04/2011   EF=50-55%; RV mildly dilated; LA mild-mod dilated;  mild mitral valve annular calcif, mild MR; mod TR, RVSP elevated 30-7mmHg, mild pulm HTN; AV mildly sclerotic, mild calcif of AV leaflets; trace pulm valve regurg        Home Medications    Prior to Admission medications   Medication Sig Start Date End Date Taking? Authorizing Provider  acetaminophen (TYLENOL) 500 MG tablet Take 1,000 mg by mouth every 6 (six) hours as needed for mild pain, moderate pain or headache.    Historical Provider, MD  Calcium Carbonate-Vitamin D (CALCIUM 600+D) 600-400 MG-UNIT tablet Take 1 tablet by  mouth 2 (two) times daily.    Historical Provider, MD  carvedilol (COREG) 25 MG tablet Take 37.5 mg by mouth 2 (two) times daily with a meal.    Historical Provider, MD  furosemide (LASIX) 20 MG tablet Take 1 tablet (20 mg total) by mouth daily. 06/08/15   Mihai Croitoru, MD  levothyroxine (SYNTHROID, LEVOTHROID) 50 MCG tablet Take 1 tablet (50 mcg total) by mouth every morning. 07/02/15   [name], MD  losartan (COZAAR) 25 MG tablet Take 25 mg by mouth daily.    Historical Provider, MD  Vitamin D, Ergocalciferol, (DRISDOL) 50000 units CAPS capsule Take 50,000 Units by mouth every 30 (thirty) days.    Historical Provider, MD  warfarin (COUMADIN) 4 MG tablet Take 4 mg by mouth every evening.    Historical Provider, MD    Family History Family History  Problem Relation Age of Onset  . Diabetes Mother   . Prostate cancer Father   . Anesthesia problems Neg Hx   . Hypotension Neg Hx   . Malignant hyperthermia Neg Hx   . Pseudochol deficiency Neg Hx     Social History Social History  Substance Use Topics  . Smoking status: Never Smoker  . Smokeless tobacco: Never Used  . Alcohol use No  lives at home Lives alone employed   Allergies   Review of patient's allergies indicates no known allergies.   Review of Systems Review of Systems  All other systems reviewed and are negative.    Physical Exam Updated Vital Signs BP 113/68 (BP Location: Right Arm)   Pulse 106   Temp 97.5 F (36.4 C) (Oral)   Resp 20   Ht 6\' 2"  (1.88 m)   Wt 250 lb (113.4 kg)   SpO2 95%   BMI 32.10 kg/m   Physical Exam  Constitutional: He is oriented to person, place, and time. He appears well-developed and well-nourished.  Non-toxic appearance. He does not appear ill. No distress.  HENT:  Head: Normocephalic and atraumatic.  Right Ear: External ear normal.  Left Ear: External ear normal.  Nose: Nose normal. No mucosal edema or rhinorrhea.  Mouth/Throat: Mucous membranes are normal. No  dental abscesses or uvula swelling.  Dry tongue  Eyes: Conjunctivae and EOM are normal. Pupils are equal, round, and reactive to light.  Neck: Normal range of motion and full passive range of motion without pain. Neck supple.  Cardiovascular: Normal rate, regular rhythm and normal heart sounds.  Exam reveals no gallop and no friction rub.   No murmur heard. Pulmonary/Chest: Effort normal and breath sounds normal. No respiratory distress. He has no wheezes. He has no rhonchi. He has no rales. He exhibits no tenderness and no crepitus.  Abdominal: Soft. Normal appearance and bowel sounds are normal. He exhibits no distension. There is no tenderness. There is no rebound and no guarding.  Patient indicates his lower abdomen on both sides gets painful,  however he is nontender to palpation. When I examine his right inguinal area do not feel any masses, when he starts to sit up I feel a bulge in the right inguinal area however when he lays back down it disappears.  Musculoskeletal: Normal range of motion. He exhibits no edema or tenderness.  Moves all extremities well.   Neurological: He is alert and oriented to person, place, and time. He has normal strength. No cranial nerve deficit.  Skin: Skin is warm, dry and intact. No rash noted. No erythema. No pallor.  Psychiatric: He has a normal mood and affect. His speech is normal and behavior is normal. His mood appears not anxious.  Nursing note and vitals reviewed.    ED Treatments / Results  Labs (all labs ordered are listed, but only abnormal results are displayed) Results for orders placed or performed during the hospital encounter of 09/18/15  Lipase, blood  Result Value Ref Range   Lipase 66 (H) 11 - 51 U/L  Comprehensive metabolic panel  Result Value Ref Range   Sodium 133 (L) 135 - 145 mmol/L   Potassium 4.4 3.5 - 5.1 mmol/L   Chloride 75 (L) 101 - 111 mmol/L   CO2 37 (H) 22 - 32 mmol/L   Glucose, Bld 142 (H) 65 - 99 mg/dL   BUN 95 (H)  6 - 20 mg/dL   Creatinine, Ser 6.53 (H) 0.61 - 1.24 mg/dL   Calcium 9.8 8.9 - 10.3 mg/dL   Total Protein 7.8 6.5 - 8.1 g/dL   Albumin 4.0 3.5 - 5.0 g/dL   AST 24 15 - 41 U/L   ALT 22 17 - 63 U/L   Alkaline Phosphatase 44 38 - 126 U/L   Total Bilirubin 1.2 0.3 - 1.2 mg/dL   GFR calc non Af Amer 7 (L) >60 mL/min   GFR calc Af Amer 9 (L) >60 mL/min   Anion gap 21 (H) 5 - 15  CBC  Result Value Ref Range   WBC 10.1 4.0 - 10.5 K/uL   RBC 4.73 4.22 - 5.81 MIL/uL   Hemoglobin 15.4 13.0 - 17.0 g/dL   HCT 44.2 39.0 - 52.0 %   MCV 93.4 78.0 - 100.0 fL   MCH 32.6 26.0 - 34.0 pg   MCHC 34.8 30.0 - 36.0 g/dL   RDW 12.6 11.5 - 15.5 %   Platelets 249 150 - 400 K/uL  Urinalysis, Routine w reflex microscopic  Result Value Ref Range   Color, Urine YELLOW YELLOW   APPearance CLEAR CLEAR   Specific Gravity, Urine >1.030 (H) 1.005 - 1.030   pH 5.5 5.0 - 8.0   Glucose, UA NEGATIVE NEGATIVE mg/dL   Hgb urine dipstick MODERATE (A) NEGATIVE   Bilirubin Urine SMALL (A) NEGATIVE   Ketones, ur NEGATIVE NEGATIVE mg/dL   Protein, ur 30 (A) NEGATIVE mg/dL   Nitrite NEGATIVE NEGATIVE   Leukocytes, UA TRACE (A) NEGATIVE  Urine microscopic-add on  Result Value Ref Range   Squamous Epithelial / LPF 0-5 (A) NONE SEEN   WBC, UA 6-30 0 - 5 WBC/hpf   RBC / HPF 6-30 0 - 5 RBC/hpf   Bacteria, UA MANY (A) NONE SEEN   Laboratory interpretation all normal except Elevated lipase, marked worsening of his baseline renal insufficiency, very low chloride, Concentrated urine with possible UTI     Radiology No results found.  Procedures Procedures (including critical care time)  Medications Ordered in ED Medications  ondansetron (ZOFRAN) injection 4 mg (not administered)  cefTRIAXone (ROCEPHIN) 1 g in dextrose 5 % 50 mL IVPB (not administered)  sodium chloride 0.9 % bolus 1,000 mL (1,000 mLs Intravenous New Bag/Given 09/18/15 0333)  sodium chloride 0.9 % bolus 1,000 mL (1,000 mLs Intravenous New Bag/Given  09/18/15 0333)  ondansetron (ZOFRAN) injection 4 mg (4 mg Intravenous Given 09/18/15 0333)     Initial Impression / Assessment and Plan / ED Course  I have reviewed the triage vital signs and the nursing notes.  Pertinent labs & imaging results that were available during my care of the patient were reviewed by me and considered in my medical decision making (see chart for details).  Clinical Course   Patient was started on IV fluids. I have talked to the son said he appears to be very dehydrated and will most likely need to be admitted. After reviewing his lab work when he returned he has a marked worsening of his baseline renal insufficiency. He also has other things consistent with severe dehydration and a possible UTI. He was given 1 dose of Rocephin IV. Urine culture was sent. Patient and sons are all in agreement that he needs to be admitted.  04:07 AM Dr Shanon Brow, hospitalist, admit to med-surg  Final Clinical Impressions(s) / ED Diagnoses   Final diagnoses:  Nausea and vomiting, vomiting of unspecified type  Dehydration  Orthostatic hypotension  Acute renal failure with other specified pathological lesion in kidney [facility])  UTI (lower urinary tract infection)  Weakness  Fall at home, initial encounter   Plan admission  Rolland Porter, MD, Barbette Or, MD 09/18/15 [phone]

## 2015-09-18 NOTE — Plan of Care (Signed)
Problem: Nutrition: Goal: Adequate nutrition will be maintained Outcome: Progressing Pt admitted with N/V x1 week. Since admission patient has eaten to two meals and has had no nausea or vomiting.   Problem: Bowel/Gastric: Goal: Will not experience complications related to bowel motility Outcome: Not Progressing Pt states he has not had a bowel movement in almost one week.

## 2015-09-19 DIAGNOSIS — I951 Orthostatic hypotension: Secondary | ICD-10-CM | POA: Diagnosis not present

## 2015-09-19 DIAGNOSIS — I4891 Unspecified atrial fibrillation: Secondary | ICD-10-CM | POA: Diagnosis not present

## 2015-09-19 DIAGNOSIS — N183 Chronic kidney disease, stage 3 (moderate): Secondary | ICD-10-CM | POA: Diagnosis not present

## 2015-09-19 DIAGNOSIS — E860 Dehydration: Secondary | ICD-10-CM | POA: Diagnosis not present

## 2015-09-19 DIAGNOSIS — N179 Acute kidney failure, unspecified: Secondary | ICD-10-CM | POA: Diagnosis not present

## 2015-09-19 LAB — BASIC METABOLIC PANEL
ANION GAP: 20 — AB (ref 5–15)
BUN: 100 mg/dL — ABNORMAL HIGH (ref 6–20)
CALCIUM: 8.4 mg/dL — AB (ref 8.9–10.3)
CO2: 34 mmol/L — ABNORMAL HIGH (ref 22–32)
Chloride: 83 mmol/L — ABNORMAL LOW (ref 101–111)
Creatinine, Ser: 5.64 mg/dL — ABNORMAL HIGH (ref 0.61–1.24)
GFR calc Af Amer: 10 mL/min — ABNORMAL LOW (ref >60)
GFR, EST NON AFRICAN AMERICAN: 9 mL/min — AB (ref >60)
GLUCOSE: 110 mg/dL — AB (ref 65–99)
POTASSIUM: 3.3 mmol/L — AB (ref 3.5–5.1)
SODIUM: 137 mmol/L (ref 135–145)

## 2015-09-19 LAB — PROTIME-INR
INR: 4.54 — AB
PROTHROMBIN TIME: 44.5 s — AB (ref 11.4–15.2)

## 2015-09-19 MED ORDER — CARVEDILOL 12.5 MG PO TABS
37.5000 mg | ORAL_TABLET | Freq: Two times a day (BID) | ORAL | Status: DC
  Administered 2015-09-19 – 2015-09-22 (×4): 37.5 mg via ORAL
  Filled 2015-09-19 (×3): qty 3
  Filled 2015-09-19: qty 12
  Filled 2015-09-19 (×2): qty 3

## 2015-09-19 MED ORDER — LEVOTHYROXINE SODIUM 50 MCG PO TABS
50.0000 ug | ORAL_TABLET | Freq: Every day | ORAL | Status: DC
  Administered 2015-09-20 – 2015-09-22 (×2): 50 ug via ORAL
  Filled 2015-09-19 (×3): qty 1

## 2015-09-19 NOTE — Progress Notes (Signed)
ANTICOAGULATION CONSULT NOTE - Initial Consult  Pharmacy Consult for Coumadin  No Known Allergies  Patient Measurements: Height: 6\' 2"  (188 cm) Weight: 250 lb (113.4 kg) IBW/kg (Calculated) : 82.2  Vital Signs: Temp: 98.4 F (36.9 C) (09/10 1440) Temp Source: Oral (09/10 1440) BP: 101/55 (09/10 1440) Pulse Rate: 79 (09/10 1440)  Labs:  Recent Labs  09/18/15 0254 09/18/15 0255 09/18/15 0730 09/19/15 0613 09/19/15 1818  HGB 15.4  --  13.9  --   --   HCT 44.2  --  40.6  --   --   PLT 249  --  223  --   --   LABPROT  --  46.4*  --   --  44.5*  INR  --  4.88*  --   --  4.54*  CREATININE 6.53*  --  6.27* 5.64*  --     Estimated Creatinine Clearance: 15.2 mL/min (by C-G formula based on SCr of 5.64 mg/dL).   Medical History: Past Medical History:  Diagnosis Date  . AF (atrial fibrillation) ([place])   . Cardiomyopathy ([place])    h/o  . Chronic kidney disease, stage 3   . Dysrhythmia   . Hypothyroidism   . Pneumonia   . Prostate cancer ([place])   . Shortness of breath   . Systemic hypertension    Medications:  Prescriptions Prior to Admission  Medication Sig Dispense Refill Last Dose  . Calcium Carbonate-Vitamin D (CALCIUM 600+D) 600-400 MG-UNIT tablet Take 1 tablet by mouth 2 (two) times daily.   09/17/2015 at Unknown time  . carvedilol (COREG) 25 MG tablet Take 37.5 mg by mouth 2 (two) times daily with a meal.   09/17/2015 at 1900  . furosemide (LASIX) 20 MG tablet Take 1 tablet (20 mg total) by mouth daily. (Patient taking differently: Take 20 mg by mouth every other day. ) 30 tablet 0 Past Week at Unknown time  . levothyroxine (SYNTHROID, LEVOTHROID) 50 MCG tablet Take 1 tablet (50 mcg total) by mouth every morning. 30 tablet 6 09/17/2015 at Unknown time  . losartan (COZAAR) 25 MG tablet Take 25 mg by mouth daily.   09/17/2015 at Unknown time  . Vitamin D, Ergocalciferol, (DRISDOL) 50000 units CAPS capsule Take 50,000 Units by mouth every 30 (thirty) days.   Past Month at  Unknown time  . acetaminophen (TYLENOL) 500 MG tablet Take 1,000 mg by mouth every 6 (six) hours as needed for mild pain, moderate pain or headache.   Unknown at Unknown time  . warfarin (COUMADIN) 4 MG tablet Take 4 mg by mouth every evening.   Unknown at Unknown time    Assessment: INR > 4.  Hold Coumadin for now Goal of Therapy:  INR 2-3 Monitor platelets by anticoagulation protocol: Yes   Plan:  Hold Coumadin (INR > 4) Allow INR to trend down Check INR daily  Hart Robinsons A 09/19/2015,9:11 PM

## 2015-09-19 NOTE — Progress Notes (Signed)
PROGRESS NOTE    [name] [name]  EYC:[id number] DOB: [birth year] DOA: 09/18/2015 PCP: [name], MD     Brief Narrative:  75 y/o man admitted on 9/9 with emesis, weakness and near syncope. Found to be very dehydrated with acute on CKD and a UTI   Assessment & Plan:   Principal Problem:   AKI (acute kidney injury) ([place]) Active Problems:   Atrial fibrillation ([place])   Long term current use of anticoagulant therapy   CKD (chronic kidney disease) stage 3, GFR 30-59 ml/min   Orthostatic syncope   Vomiting   Dehydration   Cardiomyopathy ([place])   Acute renal failure (ARF) ([facility])   Emesis -Mostly resolved. -Etiology unclear; likely related to viral gastroenteritis given rapid resolution.  Acute on CKD Stage III -Cr improving with IVF. 6.53 on admission down to 5.64 on 9/10, baseline Cr appears to be around 1.7.  Near Syncope/Weakness -Due to severe dehydration. -Request PT eval.  A Fib -Rate controlled. -Will resume coumadin.  Hypotension -Resolved, due to severe dehydration in face of copious GI losses.  UTI -Continue rocephin pending cx data.   DVT prophylaxis: Coumadin Code Status: full code Family Communication: granddaughter at bedside Disposition Plan: to be determined  Consultants:   none  Procedures:   none  Antimicrobials:   Rocephin    Subjective: Feels much improved, still very weak  Objective: Vitals:   09/18/15 2052 09/19/15 0500 09/19/15 1334 09/19/15 1440  BP: 108/76 111/72  (!) 101/55  Pulse: (!) 113 (!) 116  79  Resp: 18 18  18   Temp: 98.2 F (36.8 C) 98.5 F (36.9 C)  98.4 F (36.9 C)  TempSrc: Oral Oral  Oral  SpO2: 95% 93% 90% 92%  Weight:      Height:        Intake/Output Summary (Last 24 hours) at 09/19/15 1518 Last data filed at 09/19/15 1200  Gross per 24 hour  Intake             1605 ml  Output             1150 ml  Net              455 ml   Filed Weights   09/18/15 0151  Weight: 113.4 kg (250 lb)      Examination:  General exam: Alert, awake, oriented x 3 Respiratory system: Clear to auscultation. Respiratory effort normal. Cardiovascular system:RRR. No murmurs, rubs, gallops. Gastrointestinal system: Abdomen is nondistended, soft and nontender. No organomegaly or masses felt. Normal bowel sounds heard. Central nervous system: Alert and oriented. No focal neurological deficits. Extremities: No C/C/E, +pedal pulses Skin: No rashes, lesions or ulcers Psychiatry: Judgement and insight appear normal. Mood & affect appropriate.     Data Reviewed: I have personally reviewed following labs and imaging studies  CBC:  Recent Labs Lab 09/15/15 1635 09/18/15 0254 09/18/15 0730  WBC 10.3 10.1 9.2  HGB 15.4 15.4 13.9  HCT 44.8 44.2 40.6  MCV 94.9 93.4 94.0  PLT 256 249 149   Basic Metabolic Panel:  Recent Labs Lab 09/15/15 1635 09/18/15 0254 09/18/15 0730 09/19/15 0613  NA 138 133* 134* 137  K 4.0 4.4 3.7 3.3*  CL 88* 75* 81* 83*  CO2 32 37* 35* 34*  GLUCOSE 148* 142* 130* 110*  BUN 41* 95* 98* 100*  CREATININE 2.93* 6.53* 6.27* 5.64*  CALCIUM 10.8* 9.8 8.8* 8.4*   GFR: Estimated Creatinine Clearance: 15.2 mL/min (by C-G formula based on  SCr of 5.64 mg/dL). Liver Function Tests:  Recent Labs Lab 09/15/15 1635 09/18/15 0254  AST 21 24  ALT 18 22  ALKPHOS 49 44  BILITOT 1.1 1.2  PROT 8.1 7.8  ALBUMIN 4.5 4.0    Recent Labs Lab 09/15/15 1635 09/18/15 0254  LIPASE 32 66*   No results for input(s): AMMONIA in the last 168 hours. Coagulation Profile:  Recent Labs Lab 09/15/15 1635 09/18/15 0255  INR 2.59 4.88*   Cardiac Enzymes:  Recent Labs Lab 09/15/15 1635  TROPONINI <0.03   BNP (last 3 results) No results for input(s): PROBNP in the last 8760 hours. HbA1C: No results for input(s): HGBA1C in the last 72 hours. CBG: No results for input(s): GLUCAP in the last 168 hours. Lipid Profile: No results for input(s): CHOL, HDL, LDLCALC, TRIG,  CHOLHDL, LDLDIRECT in the last 72 hours. Thyroid Function Tests: No results for input(s): TSH, T4TOTAL, FREET4, T3FREE, THYROIDAB in the last 72 hours. Anemia Panel: No results for input(s): VITAMINB12, FOLATE, FERRITIN, TIBC, IRON, RETICCTPCT in the last 72 hours. Urine analysis:    Component Value Date/Time   COLORURINE YELLOW 09/18/2015 0225   APPEARANCEUR CLEAR 09/18/2015 0225   LABSPEC >1.030 (H) 09/18/2015 0225   PHURINE 5.5 09/18/2015 0225   GLUCOSEU NEGATIVE 09/18/2015 0225   HGBUR MODERATE (A) 09/18/2015 0225   BILIRUBINUR SMALL (A) 09/18/2015 0225   KETONESUR NEGATIVE 09/18/2015 0225   PROTEINUR 30 (A) 09/18/2015 0225   UROBILINOGEN 0.2 02/03/2009 1406   NITRITE NEGATIVE 09/18/2015 0225   LEUKOCYTESUR TRACE (A) 09/18/2015 0225   Sepsis Labs: @LABRCNTIP (procalcitonin:4,lacticidven:4)  )No results found for this or any previous visit (from the past 240 hour(s)).       Radiology Studies: US Abdomen Complete  Result Date: 09/18/2015 CLINICAL DATA:  Patient is with vomiting, weakness and near syncopal event. EXAM: ABDOMEN ULTRASOUND COMPLETE COMPARISON:  Abdominal ultrasound (aorta) 07/16/2012 FINDINGS: Gallbladder: No gallstones or wall thickening visualized. No sonographic Murphy sign noted by sonographer. Common bile duct: Diameter: 2 mm Liver: No focal lesion identified. Within normal limits in parenchymal echogenicity. IVC: Not visualized due to overlying bowel gas. Pancreas: Not visualized due to overlying bowel gas. Spleen: Not visualized due to overlying bowel gas. Right Kidney: Length: 11.6 cm. Nodular and atrophic cortex. Increased renal cortical echogenicity. No hydronephrosis. Left Kidney:  Not visualized. Abdominal aorta: Not visualized due to overlying bowel gas. Other findings: None. IMPRESSION: Markedly limited exam given extensive overlying bowel gas. No cholelithiasis or sonographic evidence for acute cholecystitis. Atrophic right kidney with increased cortical  echogenicity as can be seen with chronic medical renal disease. Electronically Signed   By: Lovey Newcomer M.D.   On: 09/18/2015 11:04        Scheduled Meds: . cefTRIAXone (ROCEPHIN)  IV  1 g Intravenous Q24H   Continuous Infusions: . sodium chloride 125 mL/hr at 09/19/15 0751     LOS: 1 day    Time spent: 25 minutes. Greater than 50% of this time was spent in direct contact with the patient coordinating care.     Lelon Frohlich, MD [facility] Pager [phone]  If 7PM-7AM, please contact night-coverage [email or web address] Password [email or web address] 09/19/2015, 3:18 PM

## 2015-09-19 NOTE — Progress Notes (Signed)
CRITICAL VALUE ALERT  Critical value received:  INR 4.54  Date of notification:  09/19/2015  Time of notification: 1945  Critical value read back: YES  Nurse who received alert: Rubbie Battiest  MD notified (1st page):  Raliegh Ip. Schorr  Time of first page:  1947  Time MD responded:  No response.

## 2015-09-20 ENCOUNTER — Inpatient Hospital Stay (HOSPITAL_COMMUNITY): Payer: PPO

## 2015-09-20 DIAGNOSIS — I4891 Unspecified atrial fibrillation: Secondary | ICD-10-CM | POA: Diagnosis not present

## 2015-09-20 DIAGNOSIS — I951 Orthostatic hypotension: Secondary | ICD-10-CM | POA: Diagnosis not present

## 2015-09-20 DIAGNOSIS — E860 Dehydration: Secondary | ICD-10-CM | POA: Diagnosis not present

## 2015-09-20 DIAGNOSIS — R1110 Vomiting, unspecified: Secondary | ICD-10-CM | POA: Diagnosis not present

## 2015-09-20 DIAGNOSIS — N179 Acute kidney failure, unspecified: Secondary | ICD-10-CM | POA: Diagnosis not present

## 2015-09-20 DIAGNOSIS — N183 Chronic kidney disease, stage 3 (moderate): Secondary | ICD-10-CM | POA: Diagnosis not present

## 2015-09-20 LAB — URINE CULTURE

## 2015-09-20 LAB — CBC
HCT: 42.4 % (ref 39.0–52.0)
HEMOGLOBIN: 14.3 g/dL (ref 13.0–17.0)
MCH: 32.4 pg (ref 26.0–34.0)
MCHC: 33.7 g/dL (ref 30.0–36.0)
MCV: 96.1 fL (ref 78.0–100.0)
Platelets: 249 10*3/uL (ref 150–400)
RBC: 4.41 MIL/uL (ref 4.22–5.81)
RDW: 12.6 % (ref 11.5–15.5)
WBC: 7.7 10*3/uL (ref 4.0–10.5)

## 2015-09-20 LAB — BASIC METABOLIC PANEL
ANION GAP: 18 — AB (ref 5–15)
BUN: 91 mg/dL — ABNORMAL HIGH (ref 6–20)
CALCIUM: 8.4 mg/dL — AB (ref 8.9–10.3)
CO2: 37 mmol/L — AB (ref 22–32)
Chloride: 84 mmol/L — ABNORMAL LOW (ref 101–111)
Creatinine, Ser: 4.78 mg/dL — ABNORMAL HIGH (ref 0.61–1.24)
GFR, EST AFRICAN AMERICAN: 13 mL/min — AB (ref >60)
GFR, EST NON AFRICAN AMERICAN: 11 mL/min — AB (ref >60)
Glucose, Bld: 129 mg/dL — ABNORMAL HIGH (ref 65–99)
Potassium: 3.5 mmol/L (ref 3.5–5.1)
Sodium: 139 mmol/L (ref 135–145)

## 2015-09-20 LAB — PROTIME-INR
INR: 4.6 — AB
PROTHROMBIN TIME: 45.1 s — AB (ref 11.4–15.2)

## 2015-09-20 MED ORDER — WARFARIN - PHARMACIST DOSING INPATIENT: Status: DC

## 2015-09-20 MED ORDER — ACETAMINOPHEN 325 MG RE SUPP
325.0000 mg | RECTAL | Status: DC | PRN
  Administered 2015-09-22 – 2015-09-27 (×2): 325 mg via RECTAL
  Filled 2015-09-20 (×2): qty 1

## 2015-09-20 MED ORDER — PROMETHAZINE HCL 25 MG/ML IJ SOLN
12.5000 mg | Freq: Once | INTRAMUSCULAR | Status: AC
  Administered 2015-09-21: 12.5 mg via INTRAVENOUS
  Filled 2015-09-20: qty 1

## 2015-09-20 MED ORDER — SODIUM CHLORIDE 0.9 % IV BOLUS (SEPSIS)
500.0000 mL | Freq: Once | INTRAVENOUS | Status: AC
  Administered 2015-09-20: 500 mL via INTRAVENOUS

## 2015-09-20 MED ORDER — POLYETHYLENE GLYCOL 3350 17 G PO PACK
17.0000 g | PACK | Freq: Every day | ORAL | Status: DC
  Administered 2015-09-20 – 2015-09-26 (×6): 17 g via ORAL
  Filled 2015-09-20 (×6): qty 1

## 2015-09-20 MED ORDER — PROMETHAZINE HCL 25 MG/ML IJ SOLN
12.5000 mg | Freq: Once | INTRAMUSCULAR | Status: AC
  Administered 2015-09-20: 12.5 mg via INTRAVENOUS
  Filled 2015-09-20: qty 1

## 2015-09-20 NOTE — Progress Notes (Signed)
ANTICOAGULATION CONSULT NOTE - Initial Consult  Pharmacy Consult for Coumadin Indication: atrial fibrillation  No Known Allergies  Patient Measurements: Height: 6\' 2"  (188 cm) Weight: 250 lb (113.4 kg) IBW/kg (Calculated) : 82.2  Vital Signs: Temp: 98.1 F (36.7 C) (09/11 0526) Temp Source: Axillary (09/11 0526) BP: 118/83 (09/11 0526) Pulse Rate: 107 (09/11 0600)  Labs:  Recent Labs  09/18/15 0254 09/18/15 0255 09/18/15 0730 09/19/15 0613 09/19/15 1818 09/20/15 0543  HGB 15.4  --  13.9  --   --  14.3  HCT 44.2  --  40.6  --   --  42.4  PLT 249  --  223  --   --  249  LABPROT  --  46.4*  --   --  44.5* 45.1*  INR  --  4.88*  --   --  4.54* 4.60*  CREATININE 6.53*  --  6.27* 5.64*  --  4.78*   Estimated Creatinine Clearance: 17.9 mL/min (by C-G formula based on SCr of 4.78 mg/dL).  Medical History: Past Medical History:  Diagnosis Date  . AF (atrial fibrillation) ([place])   . Cardiomyopathy ([place])    h/o  . Chronic kidney disease, stage 3   . Dysrhythmia   . Hypothyroidism   . Pneumonia   . Prostate cancer ([place])   . Shortness of breath   . Systemic hypertension     Medications:  Prescriptions Prior to Admission  Medication Sig Dispense Refill Last Dose  . Calcium Carbonate-Vitamin D (CALCIUM 600+D) 600-400 MG-UNIT tablet Take 1 tablet by mouth 2 (two) times daily.   09/17/2015 at Unknown time  . carvedilol (COREG) 25 MG tablet Take 37.5 mg by mouth 2 (two) times daily with a meal.   09/17/2015 at 1900  . furosemide (LASIX) 20 MG tablet Take 1 tablet (20 mg total) by mouth daily. (Patient taking differently: Take 20 mg by mouth every other day. ) 30 tablet 0 Past Week at Unknown time  . levothyroxine (SYNTHROID, LEVOTHROID) 50 MCG tablet Take 1 tablet (50 mcg total) by mouth every morning. 30 tablet 6 09/17/2015 at Unknown time  . losartan (COZAAR) 25 MG tablet Take 25 mg by mouth daily.   09/17/2015 at Unknown time  . Vitamin D, Ergocalciferol, (DRISDOL) 50000 units  CAPS capsule Take 50,000 Units by mouth every 30 (thirty) days.   Past Month at Unknown time  . acetaminophen (TYLENOL) 500 MG tablet Take 1,000 mg by mouth every 6 (six) hours as needed for mild pain, moderate pain or headache.   Unknown at Unknown time  . warfarin (COUMADIN) 4 MG tablet Take 4 mg by mouth every evening.   Unknown at Unknown time    Assessment: INR > 4.  Hold Coumadin for now.  Pt on chronic Coumadin PTA, home dose listed above.  Goal of Therapy:  INR 2-3 Monitor platelets by anticoagulation protocol: Yes   Plan:  HOLD coumadin today, allow INR to trend down Check INR daily  Hart Robinsons A 09/20/2015,2:52 PM

## 2015-09-20 NOTE — Progress Notes (Signed)
PROGRESS NOTE    [name] [name]  PPJ:[id number] DOB: 10/25/1940 DOA: 09/18/2015 PCP: [name], MD     Brief Narrative:  75 y/o man admitted on 9/9 with emesis, weakness and near syncope. Found to be very dehydrated with acute on CKD and a UTI   Assessment & Plan:   Principal Problem:   AKI (acute kidney injury) ([place]) Active Problems:   Atrial fibrillation ([place])   Long term current use of anticoagulant therapy   CKD (chronic kidney disease) stage 3, GFR 30-59 ml/min   Orthostatic syncope   Vomiting   Dehydration   Cardiomyopathy ([place])   Acute renal failure (ARF) ([facility])   Emesis -Still with some emesis. -Etiology unclear; likely related to viral gastroenteritis.  Acute on CKD Stage III -Cr improving with IVF. 6.53 on admission down to 5.64 on 9/10, 4.78 on 9/11. baseline Cr appears to be around 1.7.  Near Syncope/Weakness -Due to severe dehydration. -Request PT eval.  A Fib -Rate controlled. -Will resume coumadin.  Hypotension -Resolved, due to severe dehydration in face of copious GI losses.  UTI -Cx with multiple morphotypes. -DC rocephin after 5 days.   DVT prophylaxis: Coumadin Code Status: full code Family Communication: granddaughter at bedside updated on plan of care and questions answered. Disposition Plan: to be determined  Consultants:   none  Procedures:   none  Antimicrobials:   Rocephin    Subjective: Feels much improved, still very weak  Objective: Vitals:   09/19/15 2100 09/20/15 0526 09/20/15 0600 09/20/15 1405  BP: (!) 92/43 118/83  117/66  Pulse: (!) 110 (!) 121 (!) 107 (!) 59  Resp: 17 19  18   Temp: 97.6 F (36.4 C) 98.1 F (36.7 C)  98.4 F (36.9 C)  TempSrc: Axillary Axillary  Oral  SpO2: 94% 94%  92%  Weight:      Height:        Intake/Output Summary (Last 24 hours) at 09/20/15 1614 Last data filed at 09/20/15 1324  Gross per 24 hour  Intake          3918.75 ml  Output             2400 ml  Net           1518.75 ml   Filed Weights   09/18/15 0151  Weight: 113.4 kg (250 lb)    Examination:  General exam: Alert, awake, oriented x 3 Respiratory system: Clear to auscultation. Respiratory effort normal. Cardiovascular system:RRR. No murmurs, rubs, gallops. Gastrointestinal system: Abdomen is nondistended, soft and nontender. No organomegaly or masses felt. Normal bowel sounds heard. Central nervous system: Alert and oriented. No focal neurological deficits. Extremities: No C/C/E, +pedal pulses Skin: No rashes, lesions or ulcers Psychiatry: Judgement and insight appear normal. Mood & affect appropriate.     Data Reviewed: I have personally reviewed following labs and imaging studies  CBC:  Recent Labs Lab 09/15/15 1635 09/18/15 0254 09/18/15 0730 09/20/15 0543  WBC 10.3 10.1 9.2 7.7  HGB 15.4 15.4 13.9 14.3  HCT 44.8 44.2 40.6 42.4  MCV 94.9 93.4 94.0 96.1  PLT 256 249 223 580   Basic Metabolic Panel:  Recent Labs Lab 09/15/15 1635 09/18/15 0254 09/18/15 0730 09/19/15 0613 09/20/15 0543  NA 138 133* 134* 137 139  K 4.0 4.4 3.7 3.3* 3.5  CL 88* 75* 81* 83* 84*  CO2 32 37* 35* 34* 37*  GLUCOSE 148* 142* 130* 110* 129*  BUN 41* 95* 98* 100* 91*  CREATININE  2.93* 6.53* 6.27* 5.64* 4.78*  CALCIUM 10.8* 9.8 8.8* 8.4* 8.4*   GFR: Estimated Creatinine Clearance: 17.9 mL/min (by C-G formula based on SCr of 4.78 mg/dL). Liver Function Tests:  Recent Labs Lab 09/15/15 1635 09/18/15 0254  AST 21 24  ALT 18 22  ALKPHOS 49 44  BILITOT 1.1 1.2  PROT 8.1 7.8  ALBUMIN 4.5 4.0    Recent Labs Lab 09/15/15 1635 09/18/15 0254  LIPASE 32 66*   No results for input(s): AMMONIA in the last 168 hours. Coagulation Profile:  Recent Labs Lab 09/15/15 1635 09/18/15 0255 09/19/15 1818 09/20/15 0543  INR 2.59 4.88* 4.54* 4.60*   Cardiac Enzymes:  Recent Labs Lab 09/15/15 1635  TROPONINI <0.03   BNP (last 3 results) No results for input(s): PROBNP in  the last 8760 hours. HbA1C: No results for input(s): HGBA1C in the last 72 hours. CBG: No results for input(s): GLUCAP in the last 168 hours. Lipid Profile: No results for input(s): CHOL, HDL, LDLCALC, TRIG, CHOLHDL, LDLDIRECT in the last 72 hours. Thyroid Function Tests: No results for input(s): TSH, T4TOTAL, FREET4, T3FREE, THYROIDAB in the last 72 hours. Anemia Panel: No results for input(s): VITAMINB12, FOLATE, FERRITIN, TIBC, IRON, RETICCTPCT in the last 72 hours. Urine analysis:    Component Value Date/Time   COLORURINE YELLOW 09/18/2015 0225   APPEARANCEUR CLEAR 09/18/2015 0225   LABSPEC >1.030 (H) 09/18/2015 0225   PHURINE 5.5 09/18/2015 0225   GLUCOSEU NEGATIVE 09/18/2015 0225   HGBUR MODERATE (A) 09/18/2015 0225   BILIRUBINUR SMALL (A) 09/18/2015 0225   KETONESUR NEGATIVE 09/18/2015 0225   PROTEINUR 30 (A) 09/18/2015 0225   UROBILINOGEN 0.2 02/03/2009 1406   NITRITE NEGATIVE 09/18/2015 0225   LEUKOCYTESUR TRACE (A) 09/18/2015 0225   Sepsis Labs: @LABRCNTIP (procalcitonin:4,lacticidven:4)  ) Recent Results (from the past 240 hour(s))  Urine culture     Status: Abnormal   Collection Time: 09/18/15  2:25 AM  Result Value Ref Range Status   Specimen Description URINE, CLEAN CATCH  Final   Special Requests NONE  Final   Culture MULTIPLE SPECIES PRESENT, SUGGEST RECOLLECTION (A)  Final   Report Status 09/20/2015 FINAL  Final         Radiology Studies: Dg Abd Acute W/chest  Result Date: 09/20/2015 CLINICAL DATA:  Vomiting for 1 week EXAM: DG ABDOMEN ACUTE W/ 1V CHEST COMPARISON:  None. FINDINGS: Punctate calcifications project over the upper and lower poles of the left kidney. Probable calcifications projecting over the midpole of the right kidney. No evidence of obstruction, organomegaly or free air. No acute bony abnormality. Slight elevation of the right hemidiaphragm. No confluent opacities in the lungs. No effusions. Heart is normal size. No acute bony  abnormality. Advanced degenerative changes in the left hip with moderate degenerative changes in the right hip. IMPRESSION: Probable bilateral nephrolithiasis. No evidence of bowel obstruction or free air. No acute cardiopulmonary disease. Electronically Signed   By: Rolm Baptise M.D.   On: 09/20/2015 09:06        Scheduled Meds: . carvedilol  37.5 mg Oral BID WC  . cefTRIAXone (ROCEPHIN)  IV  1 g Intravenous Q24H  . levothyroxine  50 mcg Oral QAC breakfast  . polyethylene glycol  17 g Oral Daily  . Warfarin - Pharmacist Dosing Inpatient   Does not apply Q24H   Continuous Infusions: . sodium chloride 125 mL/hr at 09/20/15 0558     LOS: 2 days    Time spent: 25 minutes. Greater than 50% of this time  was spent in direct contact with the patient coordinating care.     Lelon Frohlich, MD [facility] Pager [phone]  If 7PM-7AM, please contact night-coverage [email or web address] Password [email or web address] 09/20/2015, 4:14 PM

## 2015-09-20 NOTE — Progress Notes (Signed)
Dr. Shanon Brow notified because pt continues to vomit.  Emesis is green in color and copious amounts (~500).  Zofran has been minimally effective.  Pt reports not having a bowel movement since 09/12/15, which is unusual for him.  Pt also reports not passing any gas for a few hours. Concerned about possible SBO? 12.5mg  phenergan given as a verbal order.  Order read back per protocol.

## 2015-09-20 NOTE — Progress Notes (Signed)
CRITICAL VALUE ALERT  Critical value received:  INR 4.6  Date of notification:  09/20/2015  Time of notification:  0718  Critical value read back: Yes  Nurse who received alert:  Rubbie Battiest, RN  MD notified (1st page):  Dr. Jerilee Hoh  Time of first page:  0721  Time MD responded:

## 2015-09-20 NOTE — Care Management Important Message (Signed)
Important Message  Patient Details  Name: GAELAN GLENNON MRN: [id number] Date of Birth: 1940-03-17   Medicare Important Message Given:  Yes    Sherald Barge, RN 09/20/2015, 10:34 AM

## 2015-09-20 NOTE — Care Management Note (Signed)
Case Management Note  Patient Details  Name: MAL ASHER MRN: [id number] Date of Birth: [birth year]  Subjective/Objective:                  Pt admitted with AKI. Pt is from home, lives alone and is ind with ADL's. Pt states his current plan is to go stay with his son at [place]. Pt nauseas this morning and unable to complete full assessment.   Action/Plan: Will cont to follow.   Expected Discharge Date:    09/24/2015              Expected Discharge Plan:  Home/Self Care  In-House Referral:  NA  Discharge planning Services  CM Consult  Post Acute Care Choice:  NA Choice offered to:  NA  DME Arranged:    DME Agency:     HH Arranged:    HH Agency:     Status of Service:  In process, will continue to follow  If discussed at Long Length of Stay Meetings, dates discussed:    Additional Comments:  Sherald Barge, RN 09/20/2015, 10:35 AM

## 2015-09-21 DIAGNOSIS — N183 Chronic kidney disease, stage 3 (moderate): Secondary | ICD-10-CM | POA: Diagnosis not present

## 2015-09-21 DIAGNOSIS — I4891 Unspecified atrial fibrillation: Secondary | ICD-10-CM | POA: Diagnosis not present

## 2015-09-21 DIAGNOSIS — N179 Acute kidney failure, unspecified: Secondary | ICD-10-CM | POA: Diagnosis not present

## 2015-09-21 DIAGNOSIS — E860 Dehydration: Secondary | ICD-10-CM | POA: Diagnosis not present

## 2015-09-21 DIAGNOSIS — I951 Orthostatic hypotension: Secondary | ICD-10-CM | POA: Diagnosis not present

## 2015-09-21 LAB — BASIC METABOLIC PANEL
Anion gap: 15 (ref 5–15)
BUN: 91 mg/dL — AB (ref 6–20)
CALCIUM: 8.1 mg/dL — AB (ref 8.9–10.3)
CO2: 35 mmol/L — ABNORMAL HIGH (ref 22–32)
CREATININE: 4.3 mg/dL — AB (ref 0.61–1.24)
Chloride: 90 mmol/L — ABNORMAL LOW (ref 101–111)
GFR calc Af Amer: 14 mL/min — ABNORMAL LOW (ref >60)
GFR, EST NON AFRICAN AMERICAN: 12 mL/min — AB (ref >60)
Glucose, Bld: 130 mg/dL — ABNORMAL HIGH (ref 65–99)
Potassium: 3.3 mmol/L — ABNORMAL LOW (ref 3.5–5.1)
SODIUM: 140 mmol/L (ref 135–145)

## 2015-09-21 LAB — CBC
HCT: 43.5 % (ref 39.0–52.0)
Hemoglobin: 14.3 g/dL (ref 13.0–17.0)
MCH: 32.2 pg (ref 26.0–34.0)
MCHC: 32.9 g/dL (ref 30.0–36.0)
MCV: 98 fL (ref 78.0–100.0)
PLATELETS: 271 10*3/uL (ref 150–400)
RBC: 4.44 MIL/uL (ref 4.22–5.81)
RDW: 12.5 % (ref 11.5–15.5)
WBC: 8 10*3/uL (ref 4.0–10.5)

## 2015-09-21 LAB — PROTIME-INR
INR: 4.55 — AB
PROTHROMBIN TIME: 43.6 s — AB (ref 11.4–15.2)

## 2015-09-21 NOTE — Progress Notes (Signed)
PROGRESS NOTE    [name] [name]  UMP:[id number] DOB: 20-Feb-1940 DOA: 09/18/2015 PCP: [name], MD     Brief Narrative:  75 y/o man admitted on 9/9 with emesis, weakness and near syncope. Found to be very dehydrated with acute on CKD and a UTI.   Assessment & Plan:   Principal Problem:   AKI (acute kidney injury) ([place]) Active Problems:   Atrial fibrillation ([place])   Long term current use of anticoagulant therapy   CKD (chronic kidney disease) stage 3, GFR 30-59 ml/min   Orthostatic syncope   Vomiting   Dehydration   Cardiomyopathy ([place])   Acute renal failure (ARF) ([facility])   Emesis -Much improved. -Etiology unclear; likely related to viral gastroenteritis. -Would like to advance his diet as he is feeling improved. Advanced to clears today.  Acute on CKD Stage III -Cr improving with IVF. 6.53 on admission down to 5.64 on 9/10, 4.78 on 9/11, 4.30 on 9/12. baseline Cr appears to be around 1.7.  Near Syncope/Weakness -Due to severe dehydration. -PT recommends HHPT vs OPPT  A Fib -Rate controlled. -Will resume coumadin. Pharmacy dosing.  Hypotension -Resolved, due to severe dehydration in face of copious GI losses.  UTI -Cx with multiple morphotypes. Will elect to continue treatment for now. -DC rocephin after 5 days (stop date ordered).   DVT prophylaxis: Coumadin Code Status: full code Family Communication: granddaughter at bedside updated on plan of care and questions answered. Disposition Plan: to be determined  Consultants:   none  Procedures:   none  Antimicrobials:   Rocephin    Subjective: Feels much improved, still weak; no emesis since yesterday  Objective: Vitals:   09/20/15 1405 09/20/15 2055 09/21/15 0008 09/21/15 0634  BP: 117/66 124/76  115/76  Pulse: (!) 59 (!) 136  89  Resp: 18 18  17   Temp: 98.4 F (36.9 C) (!) 100.4 F (38 C) 99.1 F (37.3 C) 99.5 F (37.5 C)  TempSrc: Oral Oral  Oral  SpO2: 92% 92%  92%    Weight:      Height:        Intake/Output Summary (Last 24 hours) at 09/21/15 1443 Last data filed at 09/21/15 1250  Gross per 24 hour  Intake              480 ml  Output             1300 ml  Net             -820 ml   Filed Weights   09/18/15 0151  Weight: 113.4 kg (250 lb)    Examination:  General exam: Alert, awake, oriented x 3 Respiratory system: Clear to auscultation. Respiratory effort normal. Cardiovascular system:RRR. No murmurs, rubs, gallops. Gastrointestinal system: Abdomen is nondistended, soft and nontender. No organomegaly or masses felt. Normal bowel sounds heard. Central nervous system: Alert and oriented. No focal neurological deficits. Extremities: No C/C/E, +pedal pulses Skin: No rashes, lesions or ulcers Psychiatry: Judgement and insight appear normal. Mood & affect appropriate.     Data Reviewed: I have personally reviewed following labs and imaging studies  CBC:  Recent Labs Lab 09/15/15 1635 09/18/15 0254 09/18/15 0730 09/20/15 0543 09/21/15 0612  WBC 10.3 10.1 9.2 7.7 8.0  HGB 15.4 15.4 13.9 14.3 14.3  HCT 44.8 44.2 40.6 42.4 43.5  MCV 94.9 93.4 94.0 96.1 98.0  PLT 256 249 223 249 400   Basic Metabolic Panel:  Recent Labs Lab 09/18/15 0254 09/18/15 0730 09/19/15  3790 09/20/15 0543 09/21/15 0612  NA 133* 134* 137 139 140  K 4.4 3.7 3.3* 3.5 3.3*  CL 75* 81* 83* 84* 90*  CO2 37* 35* 34* 37* 35*  GLUCOSE 142* 130* 110* 129* 130*  BUN 95* 98* 100* 91* 91*  CREATININE 6.53* 6.27* 5.64* 4.78* 4.30*  CALCIUM 9.8 8.8* 8.4* 8.4* 8.1*   GFR: Estimated Creatinine Clearance: 19.9 mL/min (by C-G formula based on SCr of 4.3 mg/dL). Liver Function Tests:  Recent Labs Lab 09/15/15 1635 09/18/15 0254  AST 21 24  ALT 18 22  ALKPHOS 49 44  BILITOT 1.1 1.2  PROT 8.1 7.8  ALBUMIN 4.5 4.0    Recent Labs Lab 09/15/15 1635 09/18/15 0254  LIPASE 32 66*   No results for input(s): AMMONIA in the last 168 hours. Coagulation  Profile:  Recent Labs Lab 09/15/15 1635 09/18/15 0255 09/19/15 1818 09/20/15 0543 09/21/15 0612  INR 2.59 4.88* 4.54* 4.60* 4.55*   Cardiac Enzymes:  Recent Labs Lab 09/15/15 1635  TROPONINI <0.03   BNP (last 3 results) No results for input(s): PROBNP in the last 8760 hours. HbA1C: No results for input(s): HGBA1C in the last 72 hours. CBG: No results for input(s): GLUCAP in the last 168 hours. Lipid Profile: No results for input(s): CHOL, HDL, LDLCALC, TRIG, CHOLHDL, LDLDIRECT in the last 72 hours. Thyroid Function Tests: No results for input(s): TSH, T4TOTAL, FREET4, T3FREE, THYROIDAB in the last 72 hours. Anemia Panel: No results for input(s): VITAMINB12, FOLATE, FERRITIN, TIBC, IRON, RETICCTPCT in the last 72 hours. Urine analysis:    Component Value Date/Time   COLORURINE YELLOW 09/18/2015 0225   APPEARANCEUR CLEAR 09/18/2015 0225   LABSPEC >1.030 (H) 09/18/2015 0225   PHURINE 5.5 09/18/2015 0225   GLUCOSEU NEGATIVE 09/18/2015 0225   HGBUR MODERATE (A) 09/18/2015 0225   BILIRUBINUR SMALL (A) 09/18/2015 0225   KETONESUR NEGATIVE 09/18/2015 0225   PROTEINUR 30 (A) 09/18/2015 0225   UROBILINOGEN 0.2 02/03/2009 1406   NITRITE NEGATIVE 09/18/2015 0225   LEUKOCYTESUR TRACE (A) 09/18/2015 0225   Sepsis Labs: @LABRCNTIP (procalcitonin:4,lacticidven:4)  ) Recent Results (from the past 240 hour(s))  Urine culture     Status: Abnormal   Collection Time: 09/18/15  2:25 AM  Result Value Ref Range Status   Specimen Description URINE, CLEAN CATCH  Final   Special Requests NONE  Final   Culture MULTIPLE SPECIES PRESENT, SUGGEST RECOLLECTION (A)  Final   Report Status 09/20/2015 FINAL  Final         Radiology Studies: Dg Abd Acute W/chest  Result Date: 09/20/2015 CLINICAL DATA:  Vomiting for 1 week EXAM: DG ABDOMEN ACUTE W/ 1V CHEST COMPARISON:  None. FINDINGS: Punctate calcifications project over the upper and lower poles of the left kidney. Probable  calcifications projecting over the midpole of the right kidney. No evidence of obstruction, organomegaly or free air. No acute bony abnormality. Slight elevation of the right hemidiaphragm. No confluent opacities in the lungs. No effusions. Heart is normal size. No acute bony abnormality. Advanced degenerative changes in the left hip with moderate degenerative changes in the right hip. IMPRESSION: Probable bilateral nephrolithiasis. No evidence of bowel obstruction or free air. No acute cardiopulmonary disease. Electronically Signed   By: Rolm Baptise M.D.   On: 09/20/2015 09:06        Scheduled Meds: . carvedilol  37.5 mg Oral BID WC  . cefTRIAXone (ROCEPHIN)  IV  1 g Intravenous Q24H  . levothyroxine  50 mcg Oral QAC breakfast  .  polyethylene glycol  17 g Oral Daily  . Warfarin - Pharmacist Dosing Inpatient   Does not apply Q24H   Continuous Infusions: . sodium chloride 125 mL/hr at 09/21/15 0131     LOS: 3 days    Time spent: 25 minutes. Greater than 50% of this time was spent in direct contact with the patient coordinating care.     Lelon Frohlich, MD [facility] Pager [phone]  If 7PM-7AM, please contact night-coverage [email or web address] Password [facility] 09/21/2015, 2:43 PM

## 2015-09-21 NOTE — Progress Notes (Signed)
ANTICOAGULATION CONSULT NOTE  Pharmacy Consult for Coumadin Indication: atrial fibrillation  No Known Allergies  Patient Measurements: Height: 6\' 2"  (188 cm) Weight: 250 lb (113.4 kg) IBW/kg (Calculated) : 82.2  Vital Signs: Temp: 99.5 F (37.5 C) (09/12 0634) Temp Source: Oral (09/12 0634) BP: 115/76 (09/12 0634) Pulse Rate: 89 (09/12 0634)  Labs:  Recent Labs  09/19/15 0613 09/19/15 1818 09/20/15 0543 09/21/15 0612  HGB  --   --  14.3 14.3  HCT  --   --  42.4 43.5  PLT  --   --  249 271  LABPROT  --  44.5* 45.1* 43.6*  INR  --  4.54* 4.60* 4.55*  CREATININE 5.64*  --  4.78* 4.30*   Estimated Creatinine Clearance: 19.9 mL/min (by C-G formula based on SCr of 4.3 mg/dL).  Medical History: Past Medical History:  Diagnosis Date  . AF (atrial fibrillation) ([place])   . Cardiomyopathy ([place])    h/o  . Chronic kidney disease, stage 3   . Dysrhythmia   . Hypothyroidism   . Pneumonia   . Prostate cancer ([place])   . Shortness of breath   . Systemic hypertension     Medications:  Prescriptions Prior to Admission  Medication Sig Dispense Refill Last Dose  . Calcium Carbonate-Vitamin D (CALCIUM 600+D) 600-400 MG-UNIT tablet Take 1 tablet by mouth 2 (two) times daily.   09/17/2015 at Unknown time  . carvedilol (COREG) 25 MG tablet Take 37.5 mg by mouth 2 (two) times daily with a meal.   09/17/2015 at 1900  . furosemide (LASIX) 20 MG tablet Take 1 tablet (20 mg total) by mouth daily. (Patient taking differently: Take 20 mg by mouth every other day. ) 30 tablet 0 Past Week at Unknown time  . levothyroxine (SYNTHROID, LEVOTHROID) 50 MCG tablet Take 1 tablet (50 mcg total) by mouth every morning. 30 tablet 6 09/17/2015 at Unknown time  . losartan (COZAAR) 25 MG tablet Take 25 mg by mouth daily.   09/17/2015 at Unknown time  . Vitamin D, Ergocalciferol, (DRISDOL) 50000 units CAPS capsule Take 50,000 Units by mouth every 30 (thirty) days.   Past Month at Unknown time  . acetaminophen  (TYLENOL) 500 MG tablet Take 1,000 mg by mouth every 6 (six) hours as needed for mild pain, moderate pain or headache.   Unknown at Unknown time  . warfarin (COUMADIN) 4 MG tablet Take 4 mg by mouth every evening.   Unknown at Unknown time    Assessment: INR remains > 4.  Hold Coumadin for now.  Pt on chronic Coumadin PTA, home dose listed above.  Goal of Therapy:  INR 2-3 Monitor platelets by anticoagulation protocol: Yes   Plan:  HOLD coumadin today, allow INR to trend down Check INR daily  Pricilla Larsson 09/21/2015,12:24 PM

## 2015-09-21 NOTE — Consult Note (Signed)
   [facility] Inpatient Consult   09/21/2015  [name] [name] [facility] 02/18/1940 [id number]   Spoke with patient at bedside regarding [facility] services, patient states he is aware of [facility] program. Patient does not want to participate with [facility] at this time. Patient given [facility] brochure and contact information for future reference, voices appreciation of information.  Of note, [facility] Care Management services would not replace or interfere with any services that are arranged by inpatient case management or social work.  For additional questions or referrals please contact:  Royetta Crochet. Laymond Purser, RN, BSN, [facility] Liaison [phone]

## 2015-09-21 NOTE — Evaluation (Signed)
Physical Therapy Evaluation Patient Details Name: [name] [name] MRN: [id number] DOB: 02/19/1940 Today's Date: 09/21/2015   History of Present Illness  75 y.o. male with medical history significant of HTN, CKD baseline cr around 2, afib on coumadin comes in for the second time this week for persistent vomiting.  He came in 2 days ago with cc of vomiting at that time.  He was told to stop taking some of his pills but is not sure which ones.  He went home and continued to vomit.  The vomiting comes right after he experiences suprapubic pain and disfomfort.  He denies any dysuria, no urinaary incontinence.  No hematuria.  No pain above the suprapubic area.  No diarrhea. No constipation.  The pain is not related with eating but is improved after his vomits.  He has had no sick contacts.  Today was very weak and his sons went to check on him and he was too weak to even stand.  They tried to get him up and he passed out when he stood up.  They finally got him into the car to come to the ED.  Pt found to be very dehydrated with worsening renal failure and was referred for admission for such.  INR: 4.55 with goal of 2-3.    Clinical Impression  Pt received in bed, and was agreeable to PT evaluation.  Pt expressed that he is normally very independent, and is a Hydrographic surveyor at baseline.  Pt expressed feeling weak.  During PT evaluation he was only able to transfer bed<>chair with RW and Min guard due to feeling fatigued and dizzy.  Pt expressed that he will be discharging home with his son and will have 24/7 supervision/assistance at home.  Also recommend that pt continue with either HHPT vs OPPT depending on transportation.      Follow Up Recommendations Home health PT;Outpatient PT (pending transportation)    Equipment Recommendations   (TBD)    Recommendations for Other Services       Precautions / Restrictions Precautions Precautions: Fall Precaution Comments: fell 3x's just prior to  admission. 1) in the kitchen, 2) oldest son spent the night, and pt had fell off the bed and couldn't get up.  3) The next night, both boys were helping him, and he passed out.   Restrictions Weight Bearing Restrictions: No      Mobility  Bed Mobility Overal bed mobility: Needs Assistance Bed Mobility: Supine to Sit     Supine to sit: HOB elevated;Modified independent (Device/Increase time)     General bed mobility comments: Increased time sitting on the EOB due to fatigue and dizziness that pt states is dissipating.   Transfers Overall transfer level: Needs assistance Equipment used: Rolling walker (2 wheeled) Transfers: Sit to/from [facility] Sit to Stand: Min guard Stand pivot transfers: Min guard       General transfer comment: Pt expressed feeling fatigued, and therefore mobility limited to transfer bed<>Chair.  Pt also with supratherapeutic INR at 4.55.  Ambulation/Gait Ambulation/Gait assistance:  (NA due to fatigue and dizziness with standing)              Stairs            Wheelchair Mobility    Modified Rankin (Stroke Patients Only)       Balance Overall balance assessment: Needs assistance Sitting-balance support: Bilateral upper extremity supported Sitting balance-Leahy Scale: Good     Standing balance support: Bilateral upper extremity supported Standing balance-Leahy  Scale: Fair                               Pertinent Vitals/Pain Pain Assessment:  (At times he has stomach cramps - he feels its because he doesn't have any food on it.  )    Home Living   Living Arrangements: Alone Available Help at Discharge: Family (son's will come by any time) Type of Home: Mobile home Home Access: Stairs to enter Entrance Stairs-Rails: Can reach both Entrance Stairs-Number of Steps: 4 Home Layout: One level Home Equipment: None      Prior Function Level of Independence: Independent         Comments: Still  driving, and is normally a Hydrographic surveyor.       Hand Dominance   Dominant Hand: Left    Extremity/Trunk Assessment   Upper Extremity Assessment: Generalized weakness           Lower Extremity Assessment: Generalized weakness         Communication   Communication: No difficulties  Cognition Arousal/Alertness: Awake/alert Behavior During Therapy: WFL for tasks assessed/performed Overall Cognitive Status: Within Functional Limits for tasks assessed                      General Comments      Exercises        Assessment/Plan    PT Assessment Patient needs continued PT services  PT Diagnosis Difficulty walking;Abnormality of gait;Generalized weakness;Acute pain   PT Problem List Decreased strength;Decreased activity tolerance;Decreased balance;Decreased mobility;Decreased knowledge of use of DME;Decreased knowledge of precautions;Obesity;Decreased safety awareness  PT Treatment Interventions DME instruction;Gait training;Stair training;Functional mobility training;Therapeutic activities;Therapeutic exercise;Balance training;Patient/family education   PT Goals (Current goals can be found in the Care Plan section) Acute Rehab PT Goals Patient Stated Goal: Pt wants to get stronger, and wants the pain to go away.  PT Goal Formulation: With patient Time For Goal Achievement: 09/28/15 Potential to Achieve Goals: Good    Frequency Min 4X/week   Barriers to discharge Decreased caregiver support Pt lives at home alone, but plans to go to his son's house for awhile     Co-evaluation               End of Session Equipment Utilized During Treatment: Gait belt Activity Tolerance: Patient limited by fatigue Patient left: in chair;with call bell/phone within reach Nurse Communication: Mobility status    Functional Assessment Tool Used: [employer] "6-clicks"  Functional Limitation: Mobility: Walking and moving around Mobility: Walking and  Moving Around Current Status [phone]): At least 40 percent but less than 60 percent impaired, limited or restricted Mobility: Walking and Moving Around Goal Status [phone]): At least 20 percent but less than 40 percent impaired, limited or restricted    Time: 1109-1129 PT Time Calculation (min) (ACUTE ONLY): 20 min   Charges:   PT Evaluation $PT Eval Moderate Complexity: 1 Procedure     PT G Codes:   PT G-Codes **NOT FOR INPATIENT CLASS** Functional Assessment Tool Used: [employer] "6-clicks"  Functional Limitation: Mobility: Walking and moving around Mobility: Walking and Moving Around Current Status [phone]): At least 40 percent but less than 60 percent impaired, limited or restricted Mobility: Walking and Moving Around Goal Status [phone]): At least 20 percent but less than 40 percent impaired, limited or restricted    Beth Camella Seim, PT, DPT X: [phone]

## 2015-09-22 ENCOUNTER — Inpatient Hospital Stay (HOSPITAL_COMMUNITY): Payer: PPO

## 2015-09-22 DIAGNOSIS — Z4682 Encounter for fitting and adjustment of non-vascular catheter: Secondary | ICD-10-CM | POA: Diagnosis not present

## 2015-09-22 DIAGNOSIS — D689 Coagulation defect, unspecified: Secondary | ICD-10-CM

## 2015-09-22 DIAGNOSIS — K5900 Constipation, unspecified: Secondary | ICD-10-CM | POA: Diagnosis present

## 2015-09-22 DIAGNOSIS — K56609 Unspecified intestinal obstruction, unspecified as to partial versus complete obstruction: Secondary | ICD-10-CM | POA: Diagnosis present

## 2015-09-22 DIAGNOSIS — K4030 Unilateral inguinal hernia, with obstruction, without gangrene, not specified as recurrent: Secondary | ICD-10-CM | POA: Diagnosis not present

## 2015-09-22 DIAGNOSIS — N183 Chronic kidney disease, stage 3 (moderate): Secondary | ICD-10-CM | POA: Diagnosis not present

## 2015-09-22 DIAGNOSIS — N200 Calculus of kidney: Secondary | ICD-10-CM | POA: Diagnosis not present

## 2015-09-22 DIAGNOSIS — K5909 Other constipation: Secondary | ICD-10-CM

## 2015-09-22 DIAGNOSIS — E039 Hypothyroidism, unspecified: Secondary | ICD-10-CM | POA: Diagnosis present

## 2015-09-22 DIAGNOSIS — E876 Hypokalemia: Secondary | ICD-10-CM | POA: Diagnosis not present

## 2015-09-22 DIAGNOSIS — T45511A Poisoning by anticoagulants, accidental (unintentional), initial encounter: Secondary | ICD-10-CM

## 2015-09-22 DIAGNOSIS — I10 Essential (primary) hypertension: Secondary | ICD-10-CM

## 2015-09-22 DIAGNOSIS — N179 Acute kidney failure, unspecified: Secondary | ICD-10-CM | POA: Diagnosis not present

## 2015-09-22 DIAGNOSIS — D6832 Hemorrhagic disorder due to extrinsic circulating anticoagulants: Secondary | ICD-10-CM | POA: Diagnosis present

## 2015-09-22 DIAGNOSIS — T45515A Adverse effect of anticoagulants, initial encounter: Secondary | ICD-10-CM

## 2015-09-22 DIAGNOSIS — R1110 Vomiting, unspecified: Secondary | ICD-10-CM

## 2015-09-22 DIAGNOSIS — I482 Chronic atrial fibrillation: Secondary | ICD-10-CM | POA: Diagnosis not present

## 2015-09-22 LAB — CBC
HCT: 41.3 % (ref 39.0–52.0)
Hemoglobin: 13.5 g/dL (ref 13.0–17.0)
MCH: 32 pg (ref 26.0–34.0)
MCHC: 32.7 g/dL (ref 30.0–36.0)
MCV: 97.9 fL (ref 78.0–100.0)
PLATELETS: 293 10*3/uL (ref 150–400)
RBC: 4.22 MIL/uL (ref 4.22–5.81)
RDW: 12.9 % (ref 11.5–15.5)
WBC: 9.4 10*3/uL (ref 4.0–10.5)

## 2015-09-22 LAB — T4, FREE: Free T4: 1.59 ng/dL — ABNORMAL HIGH (ref 0.61–1.12)

## 2015-09-22 LAB — LACTIC ACID, PLASMA: LACTIC ACID, VENOUS: 1.2 mmol/L (ref 0.5–1.9)

## 2015-09-22 LAB — BASIC METABOLIC PANEL
Anion gap: 13 (ref 5–15)
BUN: 87 mg/dL — AB (ref 6–20)
CO2: 36 mmol/L — ABNORMAL HIGH (ref 22–32)
CREATININE: 3.62 mg/dL — AB (ref 0.61–1.24)
Calcium: 7.7 mg/dL — ABNORMAL LOW (ref 8.9–10.3)
Chloride: 91 mmol/L — ABNORMAL LOW (ref 101–111)
GFR calc Af Amer: 17 mL/min — ABNORMAL LOW (ref >60)
GFR, EST NON AFRICAN AMERICAN: 15 mL/min — AB (ref >60)
GLUCOSE: 135 mg/dL — AB (ref 65–99)
Potassium: 2.9 mmol/L — ABNORMAL LOW (ref 3.5–5.1)
SODIUM: 140 mmol/L (ref 135–145)

## 2015-09-22 LAB — PROTIME-INR
INR: 5.95 — AB
Prothrombin Time: 55 seconds — ABNORMAL HIGH (ref 11.4–15.2)

## 2015-09-22 MED ORDER — METOPROLOL TARTRATE 5 MG/5ML IV SOLN
5.0000 mg | INTRAVENOUS | Status: DC
  Administered 2015-09-22 – 2015-09-25 (×12): 5 mg via INTRAVENOUS
  Filled 2015-09-22 (×13): qty 5

## 2015-09-22 MED ORDER — POTASSIUM CHLORIDE IN NACL 20-0.9 MEQ/L-% IV SOLN
INTRAVENOUS | Status: DC
  Administered 2015-09-22 (×2): via INTRAVENOUS

## 2015-09-22 MED ORDER — POTASSIUM CHLORIDE 10 MEQ/100ML IV SOLN
10.0000 meq | INTRAVENOUS | Status: AC
  Administered 2015-09-22 (×4): 10 meq via INTRAVENOUS
  Filled 2015-09-22 (×3): qty 100

## 2015-09-22 MED ORDER — FAMOTIDINE IN NACL 20-0.9 MG/50ML-% IV SOLN
20.0000 mg | Freq: Two times a day (BID) | INTRAVENOUS | Status: DC
  Administered 2015-09-22 (×2): 20 mg via INTRAVENOUS
  Filled 2015-09-22 (×2): qty 50

## 2015-09-22 MED ORDER — ONDANSETRON HCL 4 MG/2ML IJ SOLN
4.0000 mg | Freq: Four times a day (QID) | INTRAMUSCULAR | Status: DC
  Administered 2015-09-22 – 2015-09-25 (×12): 4 mg via INTRAVENOUS
  Filled 2015-09-22 (×12): qty 2

## 2015-09-22 MED ORDER — MORPHINE SULFATE (PF) 4 MG/ML IV SOLN
3.0000 mg | INTRAVENOUS | Status: DC | PRN
  Administered 2015-09-22 – 2015-10-02 (×15): 3 mg via INTRAVENOUS
  Filled 2015-09-22 (×16): qty 1

## 2015-09-22 MED ORDER — DILTIAZEM HCL 25 MG/5ML IV SOLN
5.0000 mg | Freq: Once | INTRAVENOUS | Status: AC
  Administered 2015-09-22: 5 mg via INTRAVENOUS
  Filled 2015-09-22: qty 5

## 2015-09-22 NOTE — Progress Notes (Signed)
Patient's heart rate 160-170's,b/p 104/85,temp 99.5.Dr Caryn Section notified.Patient has no c/o pain or discomfort noted. Will continue to monitor patient.

## 2015-09-22 NOTE — Progress Notes (Addendum)
PROGRESS NOTE    [name] [name]  IEP:[id number] DOB: [birth year] DOA: 09/18/2015 PCP: [name], MD   Brief Narrative:  Patient is a 75 year old man with a history of atrial flutter on Coumadin, CK D, HTN, cardiomyopathy, hypothyroidism, who presented to the ED on 09/18/2015 with persistent nausea and vomiting. He had been seen 3 days prior to admission in the ED for the same. At home, he had an apparent orthostatic syncopal episode that lasted a few seconds while he attempted to stand. In the ED, he was afebrile, mildly tachycardic, but with a low-normal blood pressure. His labs are noted for an INR of 4.88, lipase of 66, BUN of 95, creatinine of 6.53, normal LFTs, and a lactic acid of 1.8. He was admitted for further evaluation and management.   Assessment & Plan:   Principal Problem:   AKI (acute kidney injury) ([place]) Active Problems:   Orthostatic syncope   Vomiting   Constipation   Atrial fibrillation ([place])   Long term current use of anticoagulant therapy   Essential hypertension   CKD (chronic kidney disease) stage 3, GFR 30-59 ml/min   Dehydration   Cardiomyopathy ([place])   Hypokalemia   Warfarin-induced coagulopathy ([facility])   Hypothyroidism    Acute kidney injury superimposed on stage III chronic kidney disease. The patient's baseline creatinine per chart review range from 1.73-2.3. On admission, it was 6.53. Cozaar and Lasix were held on admission. He was started on vigorous IV fluids. His ins and outs were monitored. His urinalysis was suggestive of a UTI. He was started on Rocephin. -His renal function/creatinine is slowly improving. Creatinine now at 3.62. We'll continue IV fluids and holding ARB/diuretic. -Etiology is likely from prerenal azotemia/dehydration from poor oral intake and vomiting.  Persistent nausea and vomiting. Patient's lipase was marginally elevated on admission, but was not consistent with acute pancreatitis. KUB on 9/11 revealed probable  nephrolithiasis, but no evidence of bowel obstruction or free air. Patient reports constipation and has not had a bowel movement for more than one week. I am concerned about fecal impaction or ileus or SBO. -We'll order CT scan of the abdomen and pelvis with oral contrast only. We'll follow-up on the results and consider treatment with an enema and/or suppository. -We'll downgrade the diet to ice chips and sips of clears. Will start IV Pepcid empirically. Will also change his Zofran to scheduled every 6 hours.  Orthostatic syncope. Neurologically, the patient is intact. Apparently, when he stood at home, he had a brief syncopal episode. -We'll continue IV fluid hydration.  Query UTI. Patient's urinalysis was consistent with infection. However, the urine culture revealed multiple species. -We'll favor continuing Rocephin for total of 5 days before discontinuing it.  Warfarin coagulopathy. Patient's INR was 4.88 on admission. Has trended up to 5.95. Patient denies any evidence of GI or GU bleeding. Pharmacy is dosing Coumadin. and it is being held currently.  Chronic atrial fibrillation. Patient has a history of chronic atrial fibrillation, treated with Coumadin and Coreg. His INR was supratherapeutic at 4.88 on admission. Coumadin is being held and will be restarted per recommendations of pharmacy. -Patient's heart rate appeared elevated per exam, so will add telemetry and continue to monitor.  Reported history of cardiomyopathy. Per echo on 08/30/15, his EF was 50-55% with no regional wall motion abnormalities and normal diastolic function. His PA pressure was elevated at 41 mmHg.  Hypertension. Patient is treated chronically with Cozaar and Coreg. Cozaar is being held due to soft blood pressures.  We'll continue to monitor.  Hypokalemia. The patient's serum potassium was within normal limits on admission, but has drifted down to 2.9, possibly from vomiting. -We'll add IV potassium to the  maintenance IV fluids. Will give for runs of potassium chloride IV. We'll continue to monitor. -We'll check a magnesium level.       DVT prophylaxis: Coumadin Code Status: Full code Family Communication: Discussed with sons Disposition Plan: Discharged home when clinically appropriate   Consultants:   None  Procedures:   None  Antimicrobials:  Rocephin >>   Subjective: Patient reports not having a bowel movement in more than one week. His sons are in the room and are concerned that he continues to have vomiting. They report that he had a large-volume emesis last night and a small amount this morning. The patient attempted to drink clear liquids, but became nauseated. Patient denies hematemesis or bloody stools.  Objective: Vitals:   09/21/15 0008 09/21/15 0634 09/21/15 2129 09/22/15 0529  BP:  115/76 104/62 (!) 107/54  Pulse:  89 (!) 103 64  Resp:  17 18 17   Temp: 99.1 F (37.3 C) 99.5 F (37.5 C) 99.2 F (37.3 C) 99 F (37.2 C)  TempSrc:  Oral Oral Oral  SpO2:  92% 94% 91%  Weight:      Height:        Intake/Output Summary (Last 24 hours) at 09/22/15 1016 Last data filed at 09/21/15 1250  Gross per 24 hour  Intake              240 ml  Output                0 ml  Net              240 ml   Filed Weights   09/18/15 0151  Weight: 113.4 kg (250 lb)    Examination:  General exam: Appears calm and comfortable  Respiratory system: Clear With decreased breath sounds in the bases. Respiratory effort normal. Cardiovascular system: Irregular, irregular, with tachycardia. No pedal edema. Gastrointestinal system: Abdomen is obese, hypoactive bowel sounds, mildly to moderately distended, and mildly diffusely tender without rigidity. No hepatosplenomegaly. Central nervous system: Alert and oriented. No focal neurological deficits. Extremities:  No acute hot red joints. Skin: Fair turgor. Psychiatry: Judgement and insight appear normal. Mood & affect appropriate.       Data Reviewed: I have personally reviewed following labs and imaging studies  CBC:  Recent Labs Lab 09/18/15 0254 09/18/15 0730 09/20/15 0543 09/21/15 0612 09/22/15 0553  WBC 10.1 9.2 7.7 8.0 9.4  HGB 15.4 13.9 14.3 14.3 13.5  HCT 44.2 40.6 42.4 43.5 41.3  MCV 93.4 94.0 96.1 98.0 97.9  PLT 249 223 249 271 355   Basic Metabolic Panel:  Recent Labs Lab 09/18/15 0730 09/19/15 0613 09/20/15 0543 09/21/15 0612 09/22/15 0553  NA 134* 137 139 140 140  K 3.7 3.3* 3.5 3.3* 2.9*  CL 81* 83* 84* 90* 91*  CO2 35* 34* 37* 35* 36*  GLUCOSE 130* 110* 129* 130* 135*  BUN 98* 100* 91* 91* 87*  CREATININE 6.27* 5.64* 4.78* 4.30* 3.62*  CALCIUM 8.8* 8.4* 8.4* 8.1* 7.7*   GFR: Estimated Creatinine Clearance: 23.6 mL/min (by C-G formula based on SCr of 3.62 mg/dL (H)). Liver Function Tests:  Recent Labs Lab 09/15/15 1635 09/18/15 0254  AST 21 24  ALT 18 22  ALKPHOS 49 44  BILITOT 1.1 1.2  PROT 8.1 7.8  ALBUMIN 4.5 4.0  Recent Labs Lab 09/15/15 1635 09/18/15 0254  LIPASE 32 66*   No results for input(s): AMMONIA in the last 168 hours. Coagulation Profile:  Recent Labs Lab 09/18/15 0255 09/19/15 1818 09/20/15 0543 09/21/15 0612 09/22/15 0553  INR 4.88* 4.54* 4.60* 4.55* 5.95*   Cardiac Enzymes:  Recent Labs Lab 09/15/15 1635  TROPONINI <0.03   BNP (last 3 results) No results for input(s): PROBNP in the last 8760 hours. HbA1C: No results for input(s): HGBA1C in the last 72 hours. CBG: No results for input(s): GLUCAP in the last 168 hours. Lipid Profile: No results for input(s): CHOL, HDL, LDLCALC, TRIG, CHOLHDL, LDLDIRECT in the last 72 hours. Thyroid Function Tests: No results for input(s): TSH, T4TOTAL, FREET4, T3FREE, THYROIDAB in the last 72 hours. Anemia Panel: No results for input(s): VITAMINB12, FOLATE, FERRITIN, TIBC, IRON, RETICCTPCT in the last 72 hours. Sepsis Labs:  Recent Labs Lab 09/18/15 0255 09/18/15 0730  LATICACIDVEN  1.8 1.3    Recent Results (from the past 240 hour(s))  Urine culture     Status: Abnormal   Collection Time: 09/18/15  2:25 AM  Result Value Ref Range Status   Specimen Description URINE, CLEAN CATCH  Final   Special Requests NONE  Final   Culture MULTIPLE SPECIES PRESENT, SUGGEST RECOLLECTION (A)  Final   Report Status 09/20/2015 FINAL  Final         Radiology Studies: No results found.      Scheduled Meds: . carvedilol  37.5 mg Oral BID WC  . cefTRIAXone (ROCEPHIN)  IV  1 g Intravenous Q24H  . levothyroxine  50 mcg Oral QAC breakfast  . polyethylene glycol  17 g Oral Daily  . potassium chloride  10 mEq Intravenous Q1 Hr x 4  . Warfarin - Pharmacist Dosing Inpatient   Does not apply Q24H   Continuous Infusions: . 0.9 % NaCl with KCl 20 mEq / L       LOS: 4 days    Time spent: 37 minutes    Rexene Alberts, MD [facility] Pager [phone]  If 7PM-7AM, please contact night-coverage [email or web address] Password [email or web address] 09/22/2015, 10:16 AM

## 2015-09-22 NOTE — Progress Notes (Signed)
PT Cancellation Note  Patient Details Name: [name] [name] MRN: [id number] DOB: 08-Dec-1940   Cancelled Treatment:    Reason Eval/Treat Not Completed: Patient not medically ready (Pt's INR has increased from 4.55 to 5.95 today.  Per [facility] protocol, will hold PT at this time, and check back tomorrow. )   [name] [name], PT, DPT X: [phone]

## 2015-09-22 NOTE — Progress Notes (Addendum)
[place] for Coumadin Indication: atrial fibrillation  No Known Allergies  Patient Measurements: Height: 6\' 2"  (188 cm) Weight: 250 lb (113.4 kg) IBW/kg (Calculated) : 82.2  Vital Signs: Temp: 99 F (37.2 C) (09/13 0529) Temp Source: Oral (09/13 0529) BP: 107/54 (09/13 0529) Pulse Rate: 64 (09/13 0529)  Labs:  Recent Labs  09/20/15 0543 09/21/15 0612 09/22/15 0553  HGB 14.3 14.3 13.5  HCT 42.4 43.5 41.3  PLT 249 271 293  LABPROT 45.1* 43.6* 55.0*  INR 4.60* 4.55* 5.95*  CREATININE 4.78* 4.30* 3.62*   Estimated Creatinine Clearance: 23.6 mL/min (by C-G formula based on SCr of 3.62 mg/dL (H)).  Medical History: Past Medical History:  Diagnosis Date  . AF (atrial fibrillation) ([place])   . Cardiomyopathy ([place])    h/o  . Chronic kidney disease, stage 3   . Dysrhythmia   . Hypothyroidism   . Pneumonia   . Prostate cancer ([place])   . Shortness of breath   . Systemic hypertension     Medications:  Prescriptions Prior to Admission  Medication Sig Dispense Refill Last Dose  . Calcium Carbonate-Vitamin D (CALCIUM 600+D) 600-400 MG-UNIT tablet Take 1 tablet by mouth 2 (two) times daily.   09/17/2015 at Unknown time  . carvedilol (COREG) 25 MG tablet Take 37.5 mg by mouth 2 (two) times daily with a meal.   09/17/2015 at 1900  . furosemide (LASIX) 20 MG tablet Take 1 tablet (20 mg total) by mouth daily. (Patient taking differently: Take 20 mg by mouth every other day. ) 30 tablet 0 Past Week at Unknown time  . levothyroxine (SYNTHROID, LEVOTHROID) 50 MCG tablet Take 1 tablet (50 mcg total) by mouth every morning. 30 tablet 6 09/17/2015 at Unknown time  . losartan (COZAAR) 25 MG tablet Take 25 mg by mouth daily.   09/17/2015 at Unknown time  . Vitamin D, Ergocalciferol, (DRISDOL) 50000 units CAPS capsule Take 50,000 Units by mouth every 30 (thirty) days.   Past Month at Unknown time  . acetaminophen (TYLENOL) 500 MG tablet Take 1,000 mg by mouth  every 6 (six) hours as needed for mild pain, moderate pain or headache.   Unknown at Unknown time  . warfarin (COUMADIN) 4 MG tablet Take 4 mg by mouth every evening.   Unknown at Unknown time    Assessment: INR remains > 3. Continue to hold Coumadin for now.  Pt on chronic Coumadin PTA, home dose listed above.  Goal of Therapy:  INR 2-3 Monitor platelets by anticoagulation protocol: Yes   Plan:  HOLD coumadin today, allow INR to trend down Check INR daily  Isac Sarna, BS [place], [place] Clinical Pharmacist Pager [phone] 09/22/2015,8:12 AM   Spoke with Dr Caryn Section.  We may need to start heparin if needs surgery and INR falls to subtherpaeutic range.  Monitor PT/INR daily. Excell Seltzer, PharmD

## 2015-09-22 NOTE — Consult Note (Signed)
Reason for Consult: Incarcerated right internal hernia Referring Physician: Dr. Kimber Relic is an 75 y.o. male.  HPI: Patient is a 75 year old white male who was originally admitted to hospital for near syncopal episode, renal insufficiency, and vomiting who underwent a CT scan of the abdomen today which revealed an incarcerated right hernia with small bowel present. The patient states he hasn't a known right inguinal hernia over the past year. He states he has had swelling in the right inguinal region for several months. He has had intermittent episodes of nausea and vomiting. He denies any pain in the right groin region. He is currently anticoagulated for his atrial fibrillation. He has no pain while lying in the bed.  Past Medical History:  Diagnosis Date  . AF (atrial fibrillation) ([place])   . Cardiomyopathy ([place])    h/o  . Chronic kidney disease, stage 3   . Dysrhythmia   . Hypothyroidism   . Pneumonia   . Prostate cancer ([place])   . Shortness of breath   . Systemic hypertension     Past Surgical History:  Procedure Laterality Date  . CARDIOVERSION    . CATARACT EXTRACTION W/PHACO  04/17/2011   Procedure: CATARACT EXTRACTION PHACO AND INTRAOCULAR LENS PLACEMENT (IOC);  Surgeon: [name], MD;  Location: [facility];  Service: Ophthalmology;  Laterality: Left;  CDE:14.58  . CATARACT EXTRACTION W/PHACO  05/22/2011   Procedure: CATARACT EXTRACTION PHACO AND INTRAOCULAR LENS PLACEMENT (IOC);  Surgeon: [name], MD;  Location: [facility];  Service: Ophthalmology;  Laterality: Right;  CDE 13.02  . COLONOSCOPY  01/17/2011   Procedure: COLONOSCOPY;  Surgeon: Jamesetta So;  Location: [facility];  Service: Gastroenterology;  Laterality: N/A;  . NM MYOCAR PERF WALL MOTION  02/2009   dipyridamole; mild perfusion defect due to infarct/scar with mild perinfarct ischemia is apical septal, apical basal inferoseptal, basal inferior, mid inferoseptal mid inferior & apical inferior; EKG negative  for ischemia; low risk scan  . PROSTATE SURGERY     7 years ago [facility] Dr Javaid-prostatectomy  . PROSTATECTOMY    . Sleep Study  05/2011   increased upper airway resistance syndrome with mild sleep apnea during REM  . TRANSTHORACIC ECHOCARDIOGRAM  04/2011   EF=50-55%; RV mildly dilated; LA mild-mod dilated; mild mitral valve annular calcif, mild MR; mod TR, RVSP elevated 30-61mHg, mild pulm HTN; AV mildly sclerotic, mild calcif of AV leaflets; trace pulm valve regurg     Family History  Problem Relation Age of Onset  . Diabetes Mother   . Prostate cancer Father   . Anesthesia problems Neg Hx   . Hypotension Neg Hx   . Malignant hyperthermia Neg Hx   . Pseudochol deficiency Neg Hx     Social History:  reports that he has never smoked. He has never used smokeless tobacco. He reports that he does not drink alcohol or use drugs.  Allergies: No Known Allergies  Medications:  Scheduled: . cefTRIAXone (ROCEPHIN)  IV  1 g Intravenous Q24H  . diltiazem  5 mg Intravenous Once  . famotidine (PEPCID) IV  20 mg Intravenous Q12H  . levothyroxine  50 mcg Oral QAC breakfast  . metoprolol  5 mg Intravenous Q4H while awake  . ondansetron (ZOFRAN) IV  4 mg Intravenous Q6H  . polyethylene glycol  17 g Oral Daily   Continuous: . 0.9 % NaCl with KCl 20 mEq / L 125 mL/hr at 09/22/15 1413    Results for orders placed or performed  during the hospital encounter of 09/18/15 (from the past 48 hour(s))  Protime-INR     Status: Abnormal   Collection Time: 09/21/15  6:12 AM  Result Value Ref Range   Prothrombin Time 43.6 (H) 11.4 - 15.2 seconds   INR 4.55 (HH)     Comment: RESULT REPEATED AND VERIFIED CRITICAL RESULT CALLED TO, READ BACK BY AND VERIFIED WITH: ROSE,C AT 0702 BY HUFFINES,S ON 09/21/15.   Basic metabolic panel     Status: Abnormal   Collection Time: 09/21/15  6:12 AM  Result Value Ref Range   Sodium 140 135 - 145 mmol/L   Potassium 3.3 (L) 3.5 - 5.1 mmol/L   Chloride 90 (L) 101 - 111  mmol/L   CO2 35 (H) 22 - 32 mmol/L   Glucose, Bld 130 (H) 65 - 99 mg/dL   BUN 91 (H) 6 - 20 mg/dL   Creatinine, Ser 4.30 (H) 0.61 - 1.24 mg/dL   Calcium 8.1 (L) 8.9 - 10.3 mg/dL   GFR calc non Af Amer 12 (L) >60 mL/min   GFR calc Af Amer 14 (L) >60 mL/min    Comment: (NOTE) The eGFR has been calculated using the CKD EPI equation. This calculation has not been validated in all clinical situations. eGFR's persistently <60 mL/min signify possible Chronic Kidney Disease.    Anion gap 15 5 - 15  CBC     Status: None   Collection Time: 09/21/15  6:12 AM  Result Value Ref Range   WBC 8.0 4.0 - 10.5 K/uL   RBC 4.44 4.22 - 5.81 MIL/uL   Hemoglobin 14.3 13.0 - 17.0 g/dL   HCT 43.5 39.0 - 52.0 %   MCV 98.0 78.0 - 100.0 fL   MCH 32.2 26.0 - 34.0 pg   MCHC 32.9 30.0 - 36.0 g/dL   RDW 12.5 11.5 - 15.5 %   Platelets 271 150 - 400 K/uL  Protime-INR     Status: Abnormal   Collection Time: 09/22/15  5:53 AM  Result Value Ref Range   Prothrombin Time 55.0 (H) 11.4 - 15.2 seconds   INR 5.95 (HH)     Comment: RESULT REPEATED AND VERIFIED CRITICAL RESULT CALLED TO, READ BACK BY AND VERIFIED WITH: DILDY,V AT 0735 BY HUFFINES,S ON 09/22/15.   Basic metabolic panel     Status: Abnormal   Collection Time: 09/22/15  5:53 AM  Result Value Ref Range   Sodium 140 135 - 145 mmol/L   Potassium 2.9 (L) 3.5 - 5.1 mmol/L   Chloride 91 (L) 101 - 111 mmol/L   CO2 36 (H) 22 - 32 mmol/L   Glucose, Bld 135 (H) 65 - 99 mg/dL   BUN 87 (H) 6 - 20 mg/dL   Creatinine, Ser 3.62 (H) 0.61 - 1.24 mg/dL   Calcium 7.7 (L) 8.9 - 10.3 mg/dL   GFR calc non Af Amer 15 (L) >60 mL/min   GFR calc Af Amer 17 (L) >60 mL/min    Comment: (NOTE) The eGFR has been calculated using the CKD EPI equation. This calculation has not been validated in all clinical situations. eGFR's persistently <60 mL/min signify possible Chronic Kidney Disease.    Anion gap 13 5 - 15  CBC     Status: None   Collection Time: 09/22/15  5:53 AM   Result Value Ref Range   WBC 9.4 4.0 - 10.5 K/uL   RBC 4.22 4.22 - 5.81 MIL/uL   Hemoglobin 13.5 13.0 - 17.0 g/dL   HCT  41.3 39.0 - 52.0 %   MCV 97.9 78.0 - 100.0 fL   MCH 32.0 26.0 - 34.0 pg   MCHC 32.7 30.0 - 36.0 g/dL   RDW 12.9 11.5 - 15.5 %   Platelets 293 150 - 400 K/uL    Ct Abdomen Pelvis Wo Contrast  Result Date: 09/22/2015 CLINICAL DATA:  Nausea, vomiting beginning 3 days ago. EXAM: CT ABDOMEN AND PELVIS WITHOUT CONTRAST TECHNIQUE: Multidetector CT imaging of the abdomen and pelvis was performed following the standard protocol without IV contrast. COMPARISON:  Plain films 09/20/2015 FINDINGS: Lower chest: Linear atelectasis in the right lung base. 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 No pleural effusions. Heart is normal size. Hepatobiliary: No focal hepatic abnormality. Gallbladder unremarkable. Pancreas: No focal abnormality or ductal dilatation. Spleen: No focal abnormality.  Normal size. Adrenals/Urinary Tract: Kidneys are atrophic with cortical thinning and scarring. Numerous punctate bilateral nonobstructing renal stones. No hydronephrosis. Urinary bladder and adrenal glands have an unremarkable unenhanced appearance. Stomach/Bowel: Stomach and proximal to mid small bowel loops are markedly dilated due to incarcerated right inguinal hernia containing small bowel loops. Distal small bowel loops are decompressed. Scattered colonic diverticula. No active diverticulitis. Colon is decompressed. Vascular/Lymphatic: Scattered aortic and iliac calcifications. No aneurysm. No adenopathy. Reproductive: No visible focal abnormality. Other: No free fluid or free air. Musculoskeletal: Degenerative changes in the lower lumbar spine. Sclerotic focus anteriorly within the L4 vertebral body, nonspecific. This may reflect bone island. IMPRESSION: High-grade small bowel obstruction due to  incarcerated right inguinal hernia containing small bowel loops. 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 Bilateral nephrolithiasis. Small kidneys with cortical thinning and scarring. No hydronephrosis. Electronically Signed   By: Rolm Baptise M.D.   On: 09/22/2015 13:04    ROS:  Pertinent items noted in HPI and remainder of comprehensive ROS otherwise negative.  Blood pressure 104/85, pulse (!) 128, temperature 99.5 F (37.5 C), temperature source Oral, resp. rate 17, height 6' 2"  (1.88 m), weight 111.5 kg (245 lb 12.8 oz), SpO2 93 %. Physical Exam: Pleasant white male in no acute distress. Head is normocephalic, atraumatic. He has multiple teeth indicate or missing. Neck is supple without carotid bruits. Lungs clear auscultation without wheezing or rales noted. Heart examination reveals an irregularly irregular rhythm without S3, S4. Abdomen soft, nontender, nondistended. No hepatosplenomegaly or masses noted. A partially reducible right normal hernias present. Genitourinary examination is within normal limits. CT scan personally reviewed by myself. 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 He is also over anticoagulated with acute on chronic renal insufficiency. Plan: Given the CT findings, I would recommend a right inguinal herniorrhaphy with mesh. He needs to be converted to heparin drip and Coumadin held. Obviously, I have to wait for his INR to normalize prior to any surgical intervention.  Donnavin Vandenbrink A 09/22/2015, 4:41 PM

## 2015-09-22 NOTE — Progress Notes (Signed)
12 french NG tube placed per MD orders's,upon insertion,patient begin to vomit green emesis,1600 ml's. After patient was done vomiting,I procedure with insertion of NG tube,placement of tube was verified by second person, Dr Caryn Section.NG tube was positive for placement. Will continue to monitor patient.

## 2015-09-22 NOTE — Progress Notes (Signed)
Patient's K+ 2.9, INR 5.95,PT 55.0, this am. Dr Caryn Section notified. Will continue to monitor patient.Marland Kitchen

## 2015-09-22 NOTE — Care Management Important Message (Signed)
Important Message  Patient Details  Name: [name] [name] MRN: [id number] Date of Birth: Mar 09, 1940   Medicare Important Message Given:  Yes    Sherald Barge, RN 09/22/2015, 1:38 PM

## 2015-09-23 DIAGNOSIS — I10 Essential (primary) hypertension: Secondary | ICD-10-CM | POA: Diagnosis not present

## 2015-09-23 DIAGNOSIS — E876 Hypokalemia: Secondary | ICD-10-CM | POA: Diagnosis not present

## 2015-09-23 DIAGNOSIS — N183 Chronic kidney disease, stage 3 (moderate): Secondary | ICD-10-CM | POA: Diagnosis not present

## 2015-09-23 DIAGNOSIS — K4030 Unilateral inguinal hernia, with obstruction, without gangrene, not specified as recurrent: Secondary | ICD-10-CM | POA: Diagnosis not present

## 2015-09-23 DIAGNOSIS — K5909 Other constipation: Secondary | ICD-10-CM | POA: Diagnosis not present

## 2015-09-23 DIAGNOSIS — I482 Chronic atrial fibrillation: Secondary | ICD-10-CM | POA: Diagnosis not present

## 2015-09-23 DIAGNOSIS — N179 Acute kidney failure, unspecified: Secondary | ICD-10-CM | POA: Diagnosis not present

## 2015-09-23 LAB — CBC
HEMATOCRIT: 38.7 % — AB (ref 39.0–52.0)
Hemoglobin: 12.7 g/dL — ABNORMAL LOW (ref 13.0–17.0)
MCH: 31.9 pg (ref 26.0–34.0)
MCHC: 32.8 g/dL (ref 30.0–36.0)
MCV: 97.2 fL (ref 78.0–100.0)
PLATELETS: 257 10*3/uL (ref 150–400)
RBC: 3.98 MIL/uL — AB (ref 4.22–5.81)
RDW: 12.8 % (ref 11.5–15.5)
WBC: 11.1 10*3/uL — AB (ref 4.0–10.5)

## 2015-09-23 LAB — COMPREHENSIVE METABOLIC PANEL
ALT: 17 U/L (ref 17–63)
AST: 17 U/L (ref 15–41)
Albumin: 2.6 g/dL — ABNORMAL LOW (ref 3.5–5.0)
Alkaline Phosphatase: 44 U/L (ref 38–126)
Anion gap: 9 (ref 5–15)
BILIRUBIN TOTAL: 0.8 mg/dL (ref 0.3–1.2)
BUN: 75 mg/dL — AB (ref 6–20)
CHLORIDE: 99 mmol/L — AB (ref 101–111)
CO2: 30 mmol/L (ref 22–32)
CREATININE: 3.16 mg/dL — AB (ref 0.61–1.24)
Calcium: 7.3 mg/dL — ABNORMAL LOW (ref 8.9–10.3)
GFR calc Af Amer: 21 mL/min — ABNORMAL LOW (ref >60)
GFR, EST NON AFRICAN AMERICAN: 18 mL/min — AB (ref >60)
GLUCOSE: 115 mg/dL — AB (ref 65–99)
Potassium: 4.1 mmol/L (ref 3.5–5.1)
Sodium: 138 mmol/L (ref 135–145)
Total Protein: 6 g/dL — ABNORMAL LOW (ref 6.5–8.1)

## 2015-09-23 LAB — PROTIME-INR
INR: 7.36
Prothrombin Time: 65.2 seconds — ABNORMAL HIGH (ref 11.4–15.2)

## 2015-09-23 LAB — URINE CULTURE: CULTURE: NO GROWTH

## 2015-09-23 LAB — TYPE AND SCREEN
ABO/RH(D): A POS
Antibody Screen: NEGATIVE

## 2015-09-23 LAB — TSH: TSH: 1.673 u[IU]/mL (ref 0.350–4.500)

## 2015-09-23 MED ORDER — VITAMIN K1 10 MG/ML IJ SOLN
5.0000 mg | Freq: Once | INTRAMUSCULAR | Status: AC
  Administered 2015-09-23: 5 mg via SUBCUTANEOUS
  Filled 2015-09-23: qty 1

## 2015-09-23 MED ORDER — KCL IN DEXTROSE-NACL 20-5-0.9 MEQ/L-%-% IV SOLN
INTRAVENOUS | Status: DC
  Administered 2015-09-23 – 2015-09-25 (×5): via INTRAVENOUS

## 2015-09-23 MED ORDER — FAMOTIDINE IN NACL 20-0.9 MG/50ML-% IV SOLN
20.0000 mg | INTRAVENOUS | Status: DC
  Administered 2015-09-24 – 2015-10-03 (×9): 20 mg via INTRAVENOUS
  Filled 2015-09-23 (×9): qty 50

## 2015-09-23 MED ORDER — LEVOTHYROXINE SODIUM 100 MCG IV SOLR
25.0000 ug | Freq: Every day | INTRAVENOUS | Status: DC
  Administered 2015-09-24 – 2015-10-01 (×7): 25 ug via INTRAVENOUS
  Filled 2015-09-23 (×11): qty 5

## 2015-09-23 MED ORDER — SODIUM CHLORIDE 0.9 % IV SOLN
Freq: Once | INTRAVENOUS | Status: AC
  Administered 2015-09-23: 15:00:00 via INTRAVENOUS

## 2015-09-23 MED ORDER — METOPROLOL TARTRATE 5 MG/5ML IV SOLN
5.0000 mg | Freq: Once | INTRAVENOUS | Status: AC
Start: 2015-09-23 — End: 2015-09-23
  Administered 2015-09-23: 5 mg via INTRAVENOUS

## 2015-09-23 NOTE — Progress Notes (Signed)
[facility] for Coumadin and Heparin Indication: atrial fibrillation  No Known Allergies  Patient Measurements: Height: 6\' 2"  (188 cm) Weight: 245 lb 12.8 oz (111.5 kg) IBW/kg (Calculated) : 82.2  Vital Signs: Temp: 98.5 F (36.9 C) (09/14 0645) Temp Source: Oral (09/14 0645) BP: 99/73 (09/14 0645) Pulse Rate: 80 (09/14 0645)  Labs:  Recent Labs  09/21/15 0612 09/22/15 0553 09/23/15 0651  HGB 14.3 13.5 12.7*  HCT 43.5 41.3 38.7*  PLT 271 293 257  LABPROT 43.6* 55.0* 65.2*  INR 4.55* 5.95* 7.36*  CREATININE 4.30* 3.62* 3.16*   Estimated Creatinine Clearance: 26.8 mL/min (by C-G formula based on SCr of 3.16 mg/dL (H)).  Medical History: Past Medical History:  Diagnosis Date  . AF (atrial fibrillation) ([place])   . Cardiomyopathy ([place])    h/o  . Chronic kidney disease, stage 3   . Dysrhythmia   . Hypothyroidism   . Pneumonia   . Prostate cancer ([place])   . Shortness of breath   . Systemic hypertension     Medications:  Prescriptions Prior to Admission  Medication Sig Dispense Refill Last Dose  . Calcium Carbonate-Vitamin D (CALCIUM 600+D) 600-400 MG-UNIT tablet Take 1 tablet by mouth 2 (two) times daily.   09/17/2015 at Unknown time  . carvedilol (COREG) 25 MG tablet Take 37.5 mg by mouth 2 (two) times daily with a meal.   09/17/2015 at 1900  . furosemide (LASIX) 20 MG tablet Take 1 tablet (20 mg total) by mouth daily. (Patient taking differently: Take 20 mg by mouth every other day. ) 30 tablet 0 Past Week at Unknown time  . levothyroxine (SYNTHROID, LEVOTHROID) 50 MCG tablet Take 1 tablet (50 mcg total) by mouth every morning. 30 tablet 6 09/17/2015 at Unknown time  . losartan (COZAAR) 25 MG tablet Take 25 mg by mouth daily.   09/17/2015 at Unknown time  . Vitamin D, Ergocalciferol, (DRISDOL) 50000 units CAPS capsule Take 50,000 Units by mouth every 30 (thirty) days.   Past Month at Unknown time  . acetaminophen (TYLENOL) 500 MG tablet  Take 1,000 mg by mouth every 6 (six) hours as needed for mild pain, moderate pain or headache.   Unknown at Unknown time  . warfarin (COUMADIN) 4 MG tablet Take 4 mg by mouth every evening.   Unknown at Unknown time    Assessment: INR remains > 3. Continue to hold Coumadin for now.  Pt on chronic Coumadin PTA, home dose listed above. 9/13 Spoke with Dr Caryn Section.  We may need to start heparin if needs surgery and INR falls to subtherpaeutic range. INR still trending up to 7.36. No bleeding noted.  Goal of Therapy:  INR 2-3 Monitor platelets by anticoagulation protocol: Yes   Plan:  HOLD coumadin today, allow INR to trend down Check INR daily  Isac Sarna, BS [place], [place] Clinical Pharmacist Pager [phone] 09/23/2015,9:08 AM

## 2015-09-23 NOTE — Progress Notes (Signed)
PROGRESS NOTE    [name] [name]  ZDG:[id number] DOB: 01-18-1940 DOA: 09/18/2015 PCP: [name], MD   Brief Narrative:  Patient is a 75 year old man with a history of atrial flutter on Coumadin, CK D, HTN, cardiomyopathy, hypothyroidism, who presented to the ED on 09/18/2015 with persistent nausea and vomiting. He had been seen 3 days prior to admission in the ED for the same. At home, he had an apparent orthostatic syncopal episode that lasted a few seconds while he attempted to stand. In the ED, he was afebrile, mildly tachycardic, but with a low-normal blood pressure. His labs are noted for an INR of 4.88, lipase of 66, BUN of 95, creatinine of 6.53, normal LFTs, and a lactic acid of 1.8. He was admitted for further evaluation and management.   Assessment & Plan:   Principal Problem:   AKI (acute kidney injury) ([place]) Active Problems:   Orthostatic syncope   Vomiting   Constipation   SBO (small bowel obstruction) ([facility])   Incarcerated right inguinal hernia   Atrial fibrillation ([place])   Long term current use of anticoagulant therapy   Essential hypertension   CKD (chronic kidney disease) stage 3, GFR 30-59 ml/min   Dehydration   Cardiomyopathy ([place])   Hypokalemia   Warfarin-induced coagulopathy ([place])   Hypothyroidism   Incarcerated right inguinal hernia causing small bowel obstruction. Patient's lipase and LFTs were within normal limits on admission. CT scan of the abdomen/pelvis was ordered for evaluation of persistent abdominal distention and vomiting. It revealed a high-grade bowel obstruction due to incarcerated right inguinal hernia. His lactic acid level was within normal limits. -NG tube was inserted and it drained a copious amount of green bilious fluid. IV Pepcid was started empirically. Zofran was changed to 4 mg every 6 hours scheduled. -Dr. Arnoldo Morale was consulted. He did not believe the patient had evidence of strangulation or gangrenous changes on exam. Patient  will need a right inguinal herniorrhaphy prior to discharge, pending normalization of his INR. -Continue current management.  Acute kidney injury superimposed on stage III chronic kidney disease. The patient's baseline creatinine per chart review range from 1.73-2.3. On admission, it was 6.53. Cozaar and Lasix were held on admission. He was started on vigorous IV fluids. His ins and outs were monitored. His urinalysis was suggestive of a UTI. He was started on Rocephin. -His renal function/creatinine is slowly improving. Creatinine now at 3.16. We'll continue IV fluids and holding ARB/diuretic. -Etiology is likely from prerenal azotemia/dehydration from poor oral intake and vomiting and/or ATN from the SBO.  Warfarin coagulopathy. Patient is treated with Coumadin chronically for A. fib. His INR was 4.88 on admission. It continues to trend up. Patient denies any evidence of GI or GU bleeding. -INR at 7.36 today, 09/23/15. In light of surgery planned in a few days, subcutaneous vitamin K 1 and 1 unit of FFP were ordered. -If and when his INR decreases to below 2.0, before the planned hernia repair, heparin will be started as a bridge to Coumadin.  Chronic atrial fibrillation. Patient has a history of chronic atrial fibrillation, treated with Coumadin and Coreg. His INR was supratherapeutic at 4.88 on admission. Coumadin is being held and will be restarted per recommendations of pharmacy. -Patient's heart rate appeared elevated per exam, so will add telemetry and continue to monitor.  Reported history of cardiomyopathy. Per echo on 08/30/15, his EF was 50-55% with no regional wall motion abnormalities and normal diastolic function. His PA pressure was elevated at 41  mmHg.  Hypertension. Patient is treated chronically with Cozaar and Coreg. Cozaar is being held due to soft blood pressures. We'll continue to monitor. IV metoprolol was started to replace Coreg while the patient is nothing by  mouth.  Orthostatic syncope. Neurologically, the patient is intact. Apparently, when he stood at home, he had a brief syncopal episode. -We'll continue IV fluid hydration; add dextrose due to nothing by mouth status.  Query UTI. Patient's urinalysis was consistent with infection. However, the urine culture revealed multiple species. -We'll favor continuing Rocephin in light of small bowel obstruction.  Hypokalemia. The patient's serum potassium was within normal limits on admission, but has drifted down to 2.9, possibly from vomiting. -Patient was given several runs of IV potassium and potassium was added to his maintenance IV fluids. His potassium has improved. We'll continue to monitor.  Hypothyroidism. The patient was continued on Synthroid. His TSH was within normal limits. -With his nothing by mouth status and NG tube, will change Synthroid to IV.     DVT prophylaxis: Coumadin>>> being held due to coagulopathy. Code Status: Full code Family Communication: Discussed with sons Disposition Plan: Discharged home when clinically appropriate   Consultants:   Gen. surgery  Procedures:   None  Antimicrobials:  Rocephin >>   Subjective: Patient reports some discomfort with NG tube, but his abdomen feels much better and he has no nausea or vomiting.  Objective: Vitals:   09/22/15 1651 09/22/15 2242 09/23/15 0645 09/23/15 1320  BP: 110/82 (!) 153/115 99/73 111/76  Pulse: 88 (!) 114 80 (!) 101  Resp: 16 16 20 20   Temp:  98.9 F (37.2 C) 98.5 F (36.9 C) 98.4 F (36.9 C)  TempSrc: Oral Oral Oral Oral  SpO2: 97% 94% 98% 95%  Weight:      Height:        Intake/Output Summary (Last 24 hours) at 09/23/15 1448 Last data filed at 09/23/15 1300  Gross per 24 hour  Intake           164.58 ml  Output             5750 ml  Net         -5585.42 ml   Filed Weights   09/18/15 0151 09/22/15 1455  Weight: 113.4 kg (250 lb) 111.5 kg (245 lb 12.8 oz)     Examination:  General exam: Appears calm and comfortable  Respiratory system: Clear Anteriorly with decreased breath sounds in the bases. Respiratory effort normal. Cardiovascular system: Irregular, irregular, with soft systolic murmur. No pedal edema. Gastrointestinal system: Abdomen is obese, rare bowel sounds; significantly less distended compared to 09/22/15; mildly diffusely tender without rigidity or peritoneal signs. NG-draining greenish bilious fluid.. Central nervous system: Alert and oriented. No focal neurological deficits. Extremities:  No acute hot red joints. Skin: Fair turgor. Psychiatry: Judgement and insight appear normal. Mood & affect appropriate.     Data Reviewed: I have personally reviewed following labs and imaging studies  CBC:  Recent Labs Lab 09/18/15 0730 09/20/15 0543 09/21/15 0612 09/22/15 0553 09/23/15 0651  WBC 9.2 7.7 8.0 9.4 11.1*  HGB 13.9 14.3 14.3 13.5 12.7*  HCT 40.6 42.4 43.5 41.3 38.7*  MCV 94.0 96.1 98.0 97.9 97.2  PLT 223 249 271 293 865   Basic Metabolic Panel:  Recent Labs Lab 09/19/15 0613 09/20/15 0543 09/21/15 0612 09/22/15 0553 09/23/15 0651  NA 137 139 140 140 138  K 3.3* 3.5 3.3* 2.9* 4.1  CL 83* 84* 90* 91* 99*  CO2 34* 37* 35* 36* 30  GLUCOSE 110* 129* 130* 135* 115*  BUN 100* 91* 91* 87* 75*  CREATININE 5.64* 4.78* 4.30* 3.62* 3.16*  CALCIUM 8.4* 8.4* 8.1* 7.7* 7.3*   GFR: Estimated Creatinine Clearance: 26.8 mL/min (by C-G formula based on SCr of 3.16 mg/dL (H)). Liver Function Tests:  Recent Labs Lab 09/18/15 0254 09/23/15 0651  AST 24 17  ALT 22 17  ALKPHOS 44 44  BILITOT 1.2 0.8  PROT 7.8 6.0*  ALBUMIN 4.0 2.6*    Recent Labs Lab 09/18/15 0254  LIPASE 66*   No results for input(s): AMMONIA in the last 168 hours. Coagulation Profile:  Recent Labs Lab 09/19/15 1818 09/20/15 0543 09/21/15 0612 09/22/15 0553 09/23/15 0651  INR 4.54* 4.60* 4.55* 5.95* 7.36*   Cardiac Enzymes: No  results for input(s): CKTOTAL, CKMB, CKMBINDEX, TROPONINI in the last 168 hours. BNP (last 3 results) No results for input(s): PROBNP in the last 8760 hours. HbA1C: No results for input(s): HGBA1C in the last 72 hours. CBG: No results for input(s): GLUCAP in the last 168 hours. Lipid Profile: No results for input(s): CHOL, HDL, LDLCALC, TRIG, CHOLHDL, LDLDIRECT in the last 72 hours. Thyroid Function Tests:  Recent Labs  09/23/15 0651  TSH 1.673   Anemia Panel: No results for input(s): VITAMINB12, FOLATE, FERRITIN, TIBC, IRON, RETICCTPCT in the last 72 hours. Sepsis Labs:  Recent Labs Lab 09/18/15 0255 09/18/15 0730 09/22/15 1657  LATICACIDVEN 1.8 1.3 1.2    Recent Results (from the past 240 hour(s))  Urine culture     Status: Abnormal   Collection Time: 09/18/15  2:25 AM  Result Value Ref Range Status   Specimen Description URINE, CLEAN CATCH  Final   Special Requests NONE  Final   Culture MULTIPLE SPECIES PRESENT, SUGGEST RECOLLECTION (A)  Final   Report Status 09/20/2015 FINAL  Final  Urine culture     Status: None   Collection Time: 09/21/15 10:51 AM  Result Value Ref Range Status   Specimen Description URINE, CLEAN CATCH  Final   Special Requests NONE  Final   Culture NO GROWTH Performed at [facility]   Final   Report Status 09/23/2015 FINAL  Final         Radiology Studies: Ct Abdomen Pelvis Wo Contrast  Result Date: 09/22/2015 CLINICAL DATA:  Nausea, vomiting beginning 3 days ago. EXAM: CT ABDOMEN AND PELVIS WITHOUT CONTRAST TECHNIQUE: Multidetector CT imaging of the abdomen and pelvis was performed following the standard protocol without IV contrast. COMPARISON:  Plain films 09/20/2015 FINDINGS: Lower chest: Linear atelectasis in the right lung base. 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 No pleural effusions. Heart is normal size.  Hepatobiliary: No focal hepatic abnormality. Gallbladder unremarkable. Pancreas: No focal abnormality or ductal dilatation. Spleen: No focal abnormality.  Normal size. Adrenals/Urinary Tract: Kidneys are atrophic with cortical thinning and scarring. Numerous punctate bilateral nonobstructing renal stones. No hydronephrosis. Urinary bladder and adrenal glands have an unremarkable unenhanced appearance. Stomach/Bowel: Stomach and proximal to mid small bowel loops are markedly dilated due to incarcerated right inguinal hernia containing small bowel loops. Distal small bowel loops are decompressed. Scattered colonic diverticula. No active diverticulitis. Colon is decompressed. Vascular/Lymphatic: Scattered aortic and iliac calcifications. No aneurysm. No adenopathy. Reproductive: No visible focal abnormality. Other: No free fluid or free air. Musculoskeletal: Degenerative changes in the lower lumbar spine. Sclerotic focus anteriorly within the L4 vertebral body, nonspecific. This  may reflect bone island. IMPRESSION: High-grade small bowel obstruction due to incarcerated right inguinal hernia containing small bowel loops. 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 Bilateral nephrolithiasis. Small kidneys with cortical thinning and scarring. No hydronephrosis. Electronically Signed   By: Rolm Baptise M.D.   On: 09/22/2015 13:04   Dg Abd 1 View  Result Date: 09/22/2015 CLINICAL DATA:  NG tube placement. EXAM: ABDOMEN - 1 VIEW COMPARISON:  CT earlier this day. FINDINGS: The tip and side port of the enteric tube are below the diaphragm in the stomach. Dilated bowel loops on prior CT are fluid-filled and not well seen radiographically. There is no evidence of free air. IMPRESSION: Tip and side port of the enteric tube below the diaphragm in the stomach. Electronically Signed   By: Jeb Levering M.D.   On: 09/22/2015 21:55        Scheduled  Meds: . sodium chloride   Intravenous Once  . [START ON 09/24/2015] famotidine (PEPCID) IV  20 mg Intravenous Q24H  . levothyroxine  50 mcg Oral QAC breakfast  . metoprolol  5 mg Intravenous Q4H while awake  . ondansetron (ZOFRAN) IV  4 mg Intravenous Q6H  . polyethylene glycol  17 g Oral Daily   Continuous Infusions: . 0.9 % NaCl with KCl 20 mEq / L 125 mL/hr at 09/22/15 2225     LOS: 5 days    Time spent: 65 minutes    Rexene Alberts, MD [facility] Pager [phone]  If 7PM-7AM, please contact night-coverage [email or web address] Password [facility] 09/23/2015, 2:48 PM

## 2015-09-23 NOTE — Progress Notes (Signed)
PT Cancellation Note  Patient Details Name: [name] [name] MRN: [id number] DOB: May 19, 1940   Cancelled Treatment:    Reason Eval/Treat Not Completed: Patient not medically ready (Pt's INR continues to rise, and INR is at 7.36.  Will hold PT today per [facility] protocol.  Will check back tomorrow.)   Tacy Learn, PT, DPT X: [phone]

## 2015-09-23 NOTE — Progress Notes (Signed)
Subjective: Patient is comfortable today. No abdominal pain is noted.  Objective: Vital signs in last 24 hours: Temp:  [98.5 F (36.9 C)-99.5 F (37.5 C)] 98.5 F (36.9 C) (09/14 0645) Pulse Rate:  [75-128] 80 (09/14 0645) Resp:  [16-20] 20 (09/14 0645) BP: (99-153)/(73-115) 99/73 (09/14 0645) SpO2:  [93 %-98 %] 98 % (09/14 0645) Weight:  [111.5 kg (245 lb 12.8 oz)] 111.5 kg (245 lb 12.8 oz) (09/13 1455) Last BM Date: 09/12/15  Intake/Output from previous day: 09/13 0701 - 09/14 0700 In: 164.6 [I.V.:114.6; IV Piggyback:50] Out: 5400 [Urine:1400; Emesis/NG output:1600] Intake/Output this shift: No intake/output data recorded.  General appearance: alert, cooperative and no distress GI: soft, non-tender; bowel sounds normal; no masses,  no organomegaly and Right inguinal hernia still present, but no erythema or significant tenderness at site.  Lab Results:   Recent Labs  09/22/15 0553 09/23/15 0651  WBC 9.4 11.1*  HGB 13.5 12.7*  HCT 41.3 38.7*  PLT 293 257   BMET  Recent Labs  09/22/15 0553 09/23/15 0651  NA 140 138  K 2.9* 4.1  CL 91* 99*  CO2 36* 30  GLUCOSE 135* 115*  BUN 87* 75*  CREATININE 3.62* 3.16*  CALCIUM 7.7* 7.3*   PT/INR  Recent Labs  09/22/15 0553 09/23/15 0651  LABPROT 55.0* 65.2*  INR 5.95* 7.36*    Studies/Results: Ct Abdomen Pelvis Wo Contrast  Result Date: 09/22/2015 CLINICAL DATA:  Nausea, vomiting beginning 3 days ago. EXAM: CT ABDOMEN AND PELVIS WITHOUT CONTRAST TECHNIQUE: Multidetector CT imaging of the abdomen and pelvis was performed following the standard protocol without IV contrast. COMPARISON:  Plain films 09/20/2015 FINDINGS: Lower chest: Linear atelectasis in the right lung base. 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 No pleural effusions. Heart is normal size. Hepatobiliary: No focal hepatic abnormality.  Gallbladder unremarkable. Pancreas: No focal abnormality or ductal dilatation. Spleen: No focal abnormality.  Normal size. Adrenals/Urinary Tract: Kidneys are atrophic with cortical thinning and scarring. Numerous punctate bilateral nonobstructing renal stones. No hydronephrosis. Urinary bladder and adrenal glands have an unremarkable unenhanced appearance. Stomach/Bowel: Stomach and proximal to mid small bowel loops are markedly dilated due to incarcerated right inguinal hernia containing small bowel loops. Distal small bowel loops are decompressed. Scattered colonic diverticula. No active diverticulitis. Colon is decompressed. Vascular/Lymphatic: Scattered aortic and iliac calcifications. No aneurysm. No adenopathy. Reproductive: No visible focal abnormality. Other: No free fluid or free air. Musculoskeletal: Degenerative changes in the lower lumbar spine. Sclerotic focus anteriorly within the L4 vertebral body, nonspecific. This may reflect bone island. IMPRESSION: High-grade small bowel obstruction due to incarcerated right inguinal hernia containing small bowel loops. 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 Bilateral nephrolithiasis. Small kidneys with cortical thinning and scarring. No hydronephrosis. Electronically Signed   By: Rolm Baptise M.D.   On: 09/22/2015 13:04   Dg Abd 1 View  Result Date: 09/22/2015 CLINICAL DATA:  NG tube placement. EXAM: ABDOMEN - 1 VIEW COMPARISON:  CT earlier this day. FINDINGS: The tip and side port of the enteric tube are below the diaphragm in the stomach. Dilated bowel loops on prior CT are fluid-filled and not well seen radiographically. There is no evidence of free air. IMPRESSION: Tip and side port of the enteric tube below the diaphragm in the stomach. Electronically Signed   By: Jeb Levering M.D.   On: 09/22/2015 21:55    Anti-infectives:  Anti-infectives    Start     Dose/Rate Route  Frequency Ordered Stop   09/19/15 0400  cefTRIAXone (ROCEPHIN) 1 g in dextrose 5 % 50 mL IVPB     1 g 100 mL/hr over 30 Minutes Intravenous Every 24 hours 09/18/15 0522 09/23/15 0441   09/18/15 0415  cefTRIAXone (ROCEPHIN) 1 g in dextrose 5 % 50 mL IVPB     1 g 100 mL/hr over 30 Minutes Intravenous  Once 09/18/15 0406 09/18/15 0457      Assessment/Plan: Impression: Incarcerated right internal hernia with resultant small bowel obstruction. Events of last night noted. INR is significantly elevated. Patient does not have evidence of strangulation or gangrenous changes on physical examination. Patient will need right inguinal herniorrhaphy prior to discharge. It will take some time to correct the INR. I do not think fresh frozen plasma is needed as the surgery is not emergent in nature. I have temporarily schedule the patient for 09/27/2015. Patient and family understand and agreed to the treatment plan.  LOS: 5 days    Bennye Nix A 09/23/2015

## 2015-09-24 ENCOUNTER — Inpatient Hospital Stay (HOSPITAL_COMMUNITY): Payer: PPO

## 2015-09-24 DIAGNOSIS — I10 Essential (primary) hypertension: Secondary | ICD-10-CM | POA: Diagnosis not present

## 2015-09-24 DIAGNOSIS — E876 Hypokalemia: Secondary | ICD-10-CM | POA: Diagnosis not present

## 2015-09-24 DIAGNOSIS — K5909 Other constipation: Secondary | ICD-10-CM | POA: Diagnosis not present

## 2015-09-24 DIAGNOSIS — K4030 Unilateral inguinal hernia, with obstruction, without gangrene, not specified as recurrent: Secondary | ICD-10-CM | POA: Diagnosis not present

## 2015-09-24 DIAGNOSIS — R0602 Shortness of breath: Secondary | ICD-10-CM | POA: Diagnosis not present

## 2015-09-24 DIAGNOSIS — N183 Chronic kidney disease, stage 3 (moderate): Secondary | ICD-10-CM | POA: Diagnosis not present

## 2015-09-24 DIAGNOSIS — N179 Acute kidney failure, unspecified: Secondary | ICD-10-CM | POA: Diagnosis not present

## 2015-09-24 DIAGNOSIS — I482 Chronic atrial fibrillation: Secondary | ICD-10-CM | POA: Diagnosis not present

## 2015-09-24 LAB — BASIC METABOLIC PANEL
Anion gap: 9 (ref 5–15)
BUN: 52 mg/dL — AB (ref 6–20)
CALCIUM: 7.2 mg/dL — AB (ref 8.9–10.3)
CO2: 29 mmol/L (ref 22–32)
Chloride: 102 mmol/L (ref 101–111)
Creatinine, Ser: 2.67 mg/dL — ABNORMAL HIGH (ref 0.61–1.24)
GFR calc Af Amer: 25 mL/min — ABNORMAL LOW (ref >60)
GFR, EST NON AFRICAN AMERICAN: 22 mL/min — AB (ref >60)
GLUCOSE: 147 mg/dL — AB (ref 65–99)
POTASSIUM: 4.1 mmol/L (ref 3.5–5.1)
SODIUM: 140 mmol/L (ref 135–145)

## 2015-09-24 LAB — MRSA PCR SCREENING: MRSA by PCR: NEGATIVE

## 2015-09-24 LAB — PREPARE FRESH FROZEN PLASMA: Unit division: 0

## 2015-09-24 LAB — MAGNESIUM: MAGNESIUM: 2 mg/dL (ref 1.7–2.4)

## 2015-09-24 LAB — URINE MICROSCOPIC-ADD ON

## 2015-09-24 LAB — HEPARIN LEVEL (UNFRACTIONATED): Heparin Unfractionated: 0.23 IU/mL — ABNORMAL LOW (ref 0.30–0.70)

## 2015-09-24 LAB — URINALYSIS, ROUTINE W REFLEX MICROSCOPIC
Bilirubin Urine: NEGATIVE
Glucose, UA: 100 mg/dL — AB
Ketones, ur: NEGATIVE mg/dL
LEUKOCYTES UA: NEGATIVE
NITRITE: NEGATIVE
PROTEIN: 30 mg/dL — AB
Specific Gravity, Urine: 1.01 (ref 1.005–1.030)
pH: 6.5 (ref 5.0–8.0)

## 2015-09-24 LAB — PROTIME-INR
INR: 1.74
PROTHROMBIN TIME: 20.6 s — AB (ref 11.4–15.2)

## 2015-09-24 MED ORDER — DEXTROSE 5 % IV SOLN
5.0000 mg/h | INTRAVENOUS | Status: DC
  Administered 2015-09-24: 5 mg/h via INTRAVENOUS
  Administered 2015-09-24: 15 mg/h via INTRAVENOUS
  Administered 2015-09-25: 5 mg/h via INTRAVENOUS
  Administered 2015-09-25: 15 mg/h via INTRAVENOUS
  Administered 2015-09-25: 13 mg/h via INTRAVENOUS
  Administered 2015-09-26: 5 mg/h via INTRAVENOUS
  Administered 2015-09-27: 15 mg/h via INTRAVENOUS
  Administered 2015-09-27 – 2015-09-28 (×2): 10 mg/h via INTRAVENOUS
  Administered 2015-09-29 – 2015-10-01 (×3): 5 mg/h via INTRAVENOUS
  Filled 2015-09-24 (×12): qty 100

## 2015-09-24 MED ORDER — DIGOXIN 0.25 MG/ML IJ SOLN
0.1250 mg | Freq: Every day | INTRAMUSCULAR | Status: DC
  Administered 2015-09-24 – 2015-09-28 (×4): 0.125 mg via INTRAVENOUS
  Filled 2015-09-24 (×4): qty 2

## 2015-09-24 MED ORDER — HEPARIN (PORCINE) IN NACL 100-0.45 UNIT/ML-% IJ SOLN
1800.0000 [IU]/h | INTRAMUSCULAR | Status: DC
  Administered 2015-09-24: 1400 [IU]/h via INTRAVENOUS
  Administered 2015-09-25 (×2): 1600 [IU]/h via INTRAVENOUS
  Administered 2015-09-26: 1700 [IU]/h via INTRAVENOUS
  Filled 2015-09-24 (×4): qty 250

## 2015-09-24 MED ORDER — DILTIAZEM LOAD VIA INFUSION
15.0000 mg | Freq: Once | INTRAVENOUS | Status: AC
  Administered 2015-09-24: 15 mg via INTRAVENOUS
  Filled 2015-09-24: qty 15

## 2015-09-24 NOTE — Progress Notes (Addendum)
PROGRESS NOTE    [name] [name]  QMV:[id number] DOB: 1940-03-25 DOA: 09/18/2015 PCP: [name], MD   Brief Narrative:  Patient is a 75 year old man with a history of atrial fibrillation on Coumadin, CK D, HTN, cardiomyopathy, hypothyroidism, who presented to the ED on 09/18/2015 with persistent nausea and vomiting. He had been seen 3 days prior to admission in the ED for the same. At home, he had an apparent orthostatic syncopal episode that lasted a few seconds while he attempted to stand. In the ED, he was afebrile, mildly tachycardic, but with a low-normal blood pressure. His labs are noted for an INR of 4.88, lipase of 66, BUN of 95, creatinine of 6.53, normal LFTs, and a lactic acid of 1.8. He was admitted for further evaluation and management.   Assessment & Plan:   Principal Problem:   AKI (acute kidney injury) ([place]) Active Problems:   Orthostatic syncope   Vomiting   Constipation   SBO (small bowel obstruction) (H[facility])   Incarcerated right inguinal hernia   Atrial fibrillation ([place])   Long term current use of anticoagulant therapy   Essential hypertension   CKD (chronic kidney disease) stage 3, GFR 30-59 ml/min   Dehydration   Cardiomyopathy ([place])   Hypokalemia   Warfarin-induced coagulopathy (HCC)   Hypothyroidism   Low-grade fever. Patient has developed a low-grade fever this afternoon. He denies worsening abdominal pain, but has a mild cough, nonproductive. He denies pain with urination. He denies diarrhea. -We'll order blood cultures 2, chest x-ray, and repeat urinalysis.  Incarcerated right inguinal hernia causing small bowel obstruction. Patient's lipase and LFTs were within normal limits on admission. CT scan of the abdomen/pelvis was ordered for evaluation of persistent abdominal distention and vomiting. It revealed a high-grade bowel obstruction due to incarcerated right inguinal hernia. His lactic acid level was within normal limits. -NG tube was  inserted and it drained a copious amount of green bilious fluid. IV Pepcid was started empirically. Zofran was changed to 4 mg every 6 hours scheduled. -Dr. Arnoldo Morale was consulted. He did not believe the patient had evidence of strangulation or gangrenous changes on exam. Patient will need a right inguinal herniorrhaphy prior to discharge, pending normalization of his INR. -Continue current management.  Acute kidney injury superimposed on stage III chronic kidney disease. The patient's baseline creatinine per chart review range from 1.73-2.3. On admission, it was 6.53. Cozaar and Lasix were held on admission. He was started on vigorous IV fluids. His ins and outs were monitored. His urinalysis was suggestive of a UTI. He was started on Rocephin. -His renal function/creatinine is slowly improving. Creatinine now at 2.67. We'll continue IV fluids and holding ARB/diuretic. -Etiology is likely from prerenal azotemia/dehydration from poor oral intake and vomiting and/or ATN from the SBO.  Warfarin coagulopathy. Patient is treated with Coumadin chronically for A. fib. His INR was 4.88 on admission. It increased to a high of 7.36. He denies any evidence of GI or GU bleeding. -Due to the impending surgery, he was given 5 mg of vitamin K 1 and 1 unit of fresh frozen plasma. -His INR is now below 2. IV heparin is starting per pharmacy.  Chronic atrial fibrillation>> RVR. Patient has a history of chronic atrial fibrillation, treated with Coumadin and Coreg. His INR was supratherapeutic at 4.88 on admission. Coreg was held following NG tube insertion. He was started on metoprolol 5 mg IV every 4 hours. -The patient was transferred to the stepdown unit and started on  a diltiazem drip. We'll also start IV digoxin.  Reported history of cardiomyopathy. Per echo on 08/30/15, his EF was 50-55% with no regional wall motion abnormalities and normal diastolic function. His PA pressure was elevated at 41  mmHg.  Hypertension. Patient is treated chronically with Cozaar and Coreg. Cozaar is being held due to soft blood pressures. We'll continue to monitor. IV metoprolol was started to replace Coreg while the patient is nothing by mouth.  Orthostatic syncope. Neurologically, the patient is intact. Apparently, when he stood at home, he had a brief syncopal episode. -We'll continue IV fluid hydration; add dextrose due to nothing by mouth status.  Query UTI. Patient's urinalysis was consistent with infection. However, the urine culture revealed multiple species. -We'll favor continuing Rocephin in light of small bowel obstruction.  Hypokalemia. The patient's serum potassium was within normal limits on admission, but has drifted down to 2.9, possibly from vomiting. -Patient was given several runs of IV potassium and potassium was added to his maintenance IV fluids. His potassium has improved. We'll continue to monitor.  Hypothyroidism. The patient was continued on Synthroid. His TSH was within normal limits. -With his nothing by mouth status and NG tube, will change Synthroid to IV.     DVT prophylaxis: Coumadin>>> being held due to coagulopathy. Code Status: Full code Family Communication: Discussed with sons Disposition Plan: Discharged home when clinically appropriate   Consultants:   Gen. surgery  Procedures:   None  Antimicrobials:  Rocephin >>   Subjective: Patient has no complaints of pain except for the right inguinal region. He denies sore throat, diarrhea, but has a mild cough.  Objective: Vitals:   09/24/15 1041 09/24/15 1449 09/24/15 1500 09/24/15 1551  BP:  115/89 102/63 101/72  Pulse:  (!) 130 (!) 125   Resp:  (!) 26 (!) 26 (!) 22  Temp: 98.4 F (36.9 C) 98.8 F (37.1 C)  (!) 100.6 F (38.1 C)  TempSrc: Oral Oral  Oral  SpO2: 93% 91% 95% 99%  Weight:   114.7 kg (252 lb 13.9 oz)   Height:   6\' 2"  (1.88 m)     Intake/Output Summary (Last 24 hours) at  09/24/15 1702 Last data filed at 09/24/15 1556  Gross per 24 hour  Intake          4277.51 ml  Output             1850 ml  Net          2427.51 ml   Filed Weights   09/22/15 1455 09/24/15 0414 09/24/15 1500  Weight: 111.5 kg (245 lb 12.8 oz) 109.5 kg (241 lb 8 oz) 114.7 kg (252 lb 13.9 oz)    Examination:  General exam: Appears calm and comfortable  Respiratory system: Clear anteriorly, but rare crackles in the bases. Respiratory effort normal. Cardiovascular system: Irregular, irregular, with soft systolic murmur. No pedal edema. Gastrointestinal system: Abdomen is obese, rare bowel sounds; significantly less distended compared to 09/22/15; mildly diffusely tender without rigidity or peritoneal signs. NG-draining greenish bilious fluid.. Central nervous system: Alert and oriented. No focal neurological deficits. Extremities:  No acute hot red joints. Skin: Fair turgor. Psychiatry: Judgement and insight appear normal. Mood & affect appropriate.     Data Reviewed: I have personally reviewed following labs and imaging studies  CBC:  Recent Labs Lab 09/18/15 0730 09/20/15 0543 09/21/15 0612 09/22/15 0553 09/23/15 0651  WBC 9.2 7.7 8.0 9.4 11.1*  HGB 13.9 14.3 14.3 13.5 12.7*  HCT  40.6 42.4 43.5 41.3 38.7*  MCV 94.0 96.1 98.0 97.9 97.2  PLT 223 249 271 293 295   Basic Metabolic Panel:  Recent Labs Lab 09/20/15 0543 09/21/15 0612 09/22/15 0553 09/23/15 0651 09/24/15 0548  NA 139 140 140 138 140  K 3.5 3.3* 2.9* 4.1 4.1  CL 84* 90* 91* 99* 102  CO2 37* 35* 36* 30 29  GLUCOSE 129* 130* 135* 115* 147*  BUN 91* 91* 87* 75* 52*  CREATININE 4.78* 4.30* 3.62* 3.16* 2.67*  CALCIUM 8.4* 8.1* 7.7* 7.3* 7.2*  MG  --   --   --   --  2.0   GFR: Estimated Creatinine Clearance: 32.2 mL/min (by C-G formula based on SCr of 2.67 mg/dL (H)). Liver Function Tests:  Recent Labs Lab 09/18/15 0254 09/23/15 0651  AST 24 17  ALT 22 17  ALKPHOS 44 44  BILITOT 1.2 0.8  PROT  7.8 6.0*  ALBUMIN 4.0 2.6*    Recent Labs Lab 09/18/15 0254  LIPASE 66*   No results for input(s): AMMONIA in the last 168 hours. Coagulation Profile:  Recent Labs Lab 09/20/15 0543 09/21/15 0612 09/22/15 0553 09/23/15 0651 09/24/15 0548  INR 4.60* 4.55* 5.95* 7.36* 1.74   Cardiac Enzymes: No results for input(s): CKTOTAL, CKMB, CKMBINDEX, TROPONINI in the last 168 hours. BNP (last 3 results) No results for input(s): PROBNP in the last 8760 hours. HbA1C: No results for input(s): HGBA1C in the last 72 hours. CBG: No results for input(s): GLUCAP in the last 168 hours. Lipid Profile: No results for input(s): CHOL, HDL, LDLCALC, TRIG, CHOLHDL, LDLDIRECT in the last 72 hours. Thyroid Function Tests:  Recent Labs  09/23/15 0651  TSH 1.673   Anemia Panel: No results for input(s): VITAMINB12, FOLATE, FERRITIN, TIBC, IRON, RETICCTPCT in the last 72 hours. Sepsis Labs:  Recent Labs Lab 09/18/15 0255 09/18/15 0730 09/22/15 1657  LATICACIDVEN 1.8 1.3 1.2    Recent Results (from the past 240 hour(s))  Urine culture     Status: Abnormal   Collection Time: 09/18/15  2:25 AM  Result Value Ref Range Status   Specimen Description URINE, CLEAN CATCH  Final   Special Requests NONE  Final   Culture MULTIPLE SPECIES PRESENT, SUGGEST RECOLLECTION (A)  Final   Report Status 09/20/2015 FINAL  Final  Urine culture     Status: None   Collection Time: 09/21/15 10:51 AM  Result Value Ref Range Status   Specimen Description URINE, CLEAN CATCH  Final   Special Requests NONE  Final   Culture NO GROWTH Performed at [facility]   Final   Report Status 09/23/2015 FINAL  Final  MRSA PCR Screening     Status: None   Collection Time: 09/24/15  2:45 PM  Result Value Ref Range Status   MRSA by PCR NEGATIVE NEGATIVE Final    Comment:        The GeneXpert MRSA Assay (FDA approved for NASAL specimens only), is one component of a comprehensive MRSA colonization surveillance  program. It is not intended to diagnose MRSA infection nor to guide or monitor treatment for MRSA infections.          Radiology Studies: Dg Abd 1 View  Result Date: 09/22/2015 CLINICAL DATA:  NG tube placement. EXAM: ABDOMEN - 1 VIEW COMPARISON:  CT earlier this day. FINDINGS: The tip and side port of the enteric tube are below the diaphragm in the stomach. Dilated bowel loops on prior CT are fluid-filled and not well seen  radiographically. There is no evidence of free air. IMPRESSION: Tip and side port of the enteric tube below the diaphragm in the stomach. Electronically Signed   By: Jeb Levering M.D.   On: 09/22/2015 21:55        Scheduled Meds: . digoxin  0.125 mg Intravenous Daily  . famotidine (PEPCID) IV  20 mg Intravenous Q24H  . levothyroxine  25 mcg Intravenous Daily  . metoprolol  5 mg Intravenous Q4H while awake  . ondansetron (ZOFRAN) IV  4 mg Intravenous Q6H  . polyethylene glycol  17 g Oral Daily   Continuous Infusions: . dextrose 5 % and 0.9 % NaCl with KCl 20 mEq/L 125 mL/hr at 09/24/15 1500  . diltiazem (CARDIZEM) infusion 10 mg/hr (09/24/15 1556)  . heparin 1,400 Units/hr (09/24/15 1500)     LOS: 6 days    Time spent: 28 minutes    Rexene Alberts, MD [facility] Pager [phone]  If 7PM-7AM, please contact night-coverage [email or web address] Password [email or web address] 09/24/2015, 5:02 PM

## 2015-09-24 NOTE — Progress Notes (Signed)
PT Cancellation Note  Patient Details Name: [name] [name] MRN: [id number] DOB: 18-Feb-1940   Cancelled Treatment:    Reason Eval/Treat Not Completed: Patient not medically ready (Pt's INR is now WNL at 1.74, however he now has Afib with HR up to 148bpm, and per RN, he is getting ready to transfer to the ICU.   Wil d/c PT orders at this time.  Please re-order when pt is medically appropriate to participate in skilled therapeutic intervention )   Beth Prudence Heiny, PT, DPT X: [phone]

## 2015-09-24 NOTE — Progress Notes (Signed)
Subjective: No new complaints noted.  Objective: Vital signs in last 24 hours: Temp:  [98.4 F (36.9 C)-100.2 F (37.9 C)] 100.2 F (37.9 C) (09/15 0414) Pulse Rate:  [69-101] 99 (09/15 0414) Resp:  [18-20] 18 (09/15 0414) BP: (86-130)/(48-80) 110/76 (09/15 0414) SpO2:  [90 %-96 %] 92 % (09/15 0414) Weight:  [109.5 kg (241 lb 8 oz)] 109.5 kg (241 lb 8 oz) (09/15 0414) Last BM Date: 09/12/15  Intake/Output from previous day: 09/14 0701 - 09/15 0700 In: 3438.4 [I.V.:3013.9; Blood:424.5] Out: 3000 [Urine:2200; Emesis/NG output:800] Intake/Output this shift: No intake/output data recorded.  General appearance: alert, cooperative and no distress GI: soft, non-tender; bowel sounds normal; no masses,  no organomegaly and Right renal hernia still present.  Lab Results:   Recent Labs  09/22/15 0553 09/23/15 0651  WBC 9.4 11.1*  HGB 13.5 12.7*  HCT 41.3 38.7*  PLT 293 257   BMET  Recent Labs  09/23/15 0651 09/24/15 0548  NA 138 140  K 4.1 4.1  CL 99* 102  CO2 30 29  GLUCOSE 115* 147*  BUN 75* 52*  CREATININE 3.16* 2.67*  CALCIUM 7.3* 7.2*   PT/INR  Recent Labs  09/23/15 0651 09/24/15 0548  LABPROT 65.2* 20.6*  INR 7.36* 1.74    Studies/Results: Ct Abdomen Pelvis Wo Contrast  Result Date: 09/22/2015 CLINICAL DATA:  Nausea, vomiting beginning 3 days ago. EXAM: CT ABDOMEN AND PELVIS WITHOUT CONTRAST TECHNIQUE: Multidetector CT imaging of the abdomen and pelvis was performed following the standard protocol without IV contrast. COMPARISON:  Plain films 09/20/2015 FINDINGS: Lower chest: Linear atelectasis in the right lung base. 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 No pleural effusions. Heart is normal size. Hepatobiliary: No focal hepatic abnormality. Gallbladder unremarkable. Pancreas: No focal abnormality or ductal dilatation. Spleen: No focal  abnormality.  Normal size. Adrenals/Urinary Tract: Kidneys are atrophic with cortical thinning and scarring. Numerous punctate bilateral nonobstructing renal stones. No hydronephrosis. Urinary bladder and adrenal glands have an unremarkable unenhanced appearance. Stomach/Bowel: Stomach and proximal to mid small bowel loops are markedly dilated due to incarcerated right inguinal hernia containing small bowel loops. Distal small bowel loops are decompressed. Scattered colonic diverticula. No active diverticulitis. Colon is decompressed. Vascular/Lymphatic: Scattered aortic and iliac calcifications. No aneurysm. No adenopathy. Reproductive: No visible focal abnormality. Other: No free fluid or free air. Musculoskeletal: Degenerative changes in the lower lumbar spine. Sclerotic focus anteriorly within the L4 vertebral body, nonspecific. This may reflect bone island. IMPRESSION: High-grade small bowel obstruction due to incarcerated right inguinal hernia containing small bowel loops. 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 Bilateral nephrolithiasis. Small kidneys with cortical thinning and scarring. No hydronephrosis. Electronically Signed   By: Rolm Baptise M.D.   On: 09/22/2015 13:04   Dg Abd 1 View  Result Date: 09/22/2015 CLINICAL DATA:  NG tube placement. EXAM: ABDOMEN - 1 VIEW COMPARISON:  CT earlier this day. FINDINGS: The tip and side port of the enteric tube are below the diaphragm in the stomach. Dilated bowel loops on prior CT are fluid-filled and not well seen radiographically. There is no evidence of free air. IMPRESSION: Tip and side port of the enteric tube below the diaphragm in the stomach. Electronically Signed   By: Jeb Levering M.D.   On: 09/22/2015 21:55    Anti-infectives: Anti-infectives    Start     Dose/Rate Route Frequency Ordered Stop  09/19/15 0400  cefTRIAXone (ROCEPHIN) 1 g in dextrose 5 % 50 mL IVPB      1 g 100 mL/hr over 30 Minutes Intravenous Every 24 hours 09/18/15 0522 09/23/15 0441   09/18/15 0415  cefTRIAXone (ROCEPHIN) 1 g in dextrose 5 % 50 mL IVPB     1 g 100 mL/hr over 30 Minutes Intravenous  Once 09/18/15 0406 09/18/15 0457      Assessment/Plan: Impression: Incarcerated right renal hernia, stable. INR has normalized. BUN and creatinine are improving. Plan: Would continue IV hydration to normalize BUN and creatinine as much as possible. It is okay to use a heparin drip. Still anticipate surgery on 09/27/2015.  LOS: 6 days    [name] [name] A 09/24/2015

## 2015-09-24 NOTE — Progress Notes (Addendum)
[place] for Coumadin and Heparin Indication: atrial fibrillation  No Known Allergies  Patient Measurements: Height: 6\' 2"  (188 cm) Weight: 241 lb 8 oz (109.5 kg) (bed) IBW/kg (Calculated) : 82.2  Vital Signs: Temp: 100.2 F (37.9 C) (09/15 0414) Temp Source: Oral (09/15 0414) BP: 110/76 (09/15 0414) Pulse Rate: 99 (09/15 0414)  Labs:  Recent Labs  09/22/15 0553 09/23/15 0651  HGB 13.5 12.7*  HCT 41.3 38.7*  PLT 293 257  LABPROT 55.0* 65.2*  INR 5.95* 7.36*  CREATININE 3.62* 3.16*   Estimated Creatinine Clearance: 26.6 mL/min (by C-G formula based on SCr of 3.16 mg/dL (H)).  Medical History: Past Medical History:  Diagnosis Date  . AF (atrial fibrillation) ([place])   . Cardiomyopathy ([place])    h/o  . Chronic kidney disease, stage 3   . Dysrhythmia   . Hypothyroidism   . Pneumonia   . Prostate cancer ([place])   . Shortness of breath   . Systemic hypertension     Medications:  Prescriptions Prior to Admission  Medication Sig Dispense Refill Last Dose  . Calcium Carbonate-Vitamin D (CALCIUM 600+D) 600-400 MG-UNIT tablet Take 1 tablet by mouth 2 (two) times daily.   09/17/2015 at Unknown time  . carvedilol (COREG) 25 MG tablet Take 37.5 mg by mouth 2 (two) times daily with a meal.   09/17/2015 at 1900  . furosemide (LASIX) 20 MG tablet Take 1 tablet (20 mg total) by mouth daily. (Patient taking differently: Take 20 mg by mouth every other day. ) 30 tablet 0 Past Week at Unknown time  . levothyroxine (SYNTHROID, LEVOTHROID) 50 MCG tablet Take 1 tablet (50 mcg total) by mouth every morning. 30 tablet 6 09/17/2015 at Unknown time  . losartan (COZAAR) 25 MG tablet Take 25 mg by mouth daily.   09/17/2015 at Unknown time  . Vitamin D, Ergocalciferol, (DRISDOL) 50000 units CAPS capsule Take 50,000 Units by mouth every 30 (thirty) days.   Past Month at Unknown time  . acetaminophen (TYLENOL) 500 MG tablet Take 1,000 mg by mouth every 6 (six) hours  as needed for mild pain, moderate pain or headache.   Unknown at Unknown time  . warfarin (COUMADIN) 4 MG tablet Take 4 mg by mouth every evening.   Unknown at Unknown time    Assessment: INR 1.74. Continue to hold Coumadin for now.  Pt on chronic Coumadin PTA, home dose listed above. 9/13 Spoke with Dr Caryn Section.  We may need to start heparin if needs surgery and INR falls to subtherpaeutic range. INR 1.74 No bleeding noted. Rec'd Vit K 5 mg sq on 9/14 Goal of Therapy:  INR 2-3 Monitor platelets by anticoagulation protocol: Yes  Heparin level 0.3-0.7 units/ml   Plan:  HOLD coumadin today, allow INR to trend down Start heparin drip as INR <2 Heparin drip at 1400 units/hr Checkl HL in ~6-8 hours and daily while on heparin  Excell Seltzer, Pharm D Clinical Pharmacist 09/24/2015,8:00 AM   Addun:  Heparin level is subtherapeutic.  Will increase dose to 1600 units /hr.  F/u am labs

## 2015-09-24 NOTE — Care Management Note (Signed)
Case Management Note  Patient Details  Name: [name] [name] MRN: [id number] Date of Birth: 1940-01-16   Expected Discharge Date:      09/29/2015            Expected Discharge Plan:  [place]  In-House Referral:  NA  Discharge planning Services  CM Consult  Post Acute Care Choice:  Home Health Choice offered to:  NA  DME Arranged:    DME Agency:     HH Arranged:    HH Agency:     Status of Service:  In process, will continue to follow  If discussed at Long Length of Stay Meetings, dates discussed:    Additional Comments: Pt scheduled for surgery on Monday. Anticipate pt will need [place] and PT at discharge. Pt did not feel well enough to discuss [place] options this morning. Will discuss further with pt after surgery.   Sherald Barge, RN 09/24/2015, 12:27 PM

## 2015-09-24 NOTE — Progress Notes (Signed)
Patient's heart rate 160's-180's,Dr Fisher notified. Assessment done by Dr Caryn Section at bedside. Orders received,and given.Patient is to transfer to ICU for further observation,and management of heart rate. Report called,and given to Hall Busing RN.

## 2015-09-24 NOTE — Care Management Important Message (Signed)
Important Message  Patient Details  Name: [name] [name] MRN: [id number] Date of Birth: 08-13-1940   Medicare Important Message Given:  Yes    Sherald Barge, RN 09/24/2015, 12:25 PM

## 2015-09-25 DIAGNOSIS — N183 Chronic kidney disease, stage 3 (moderate): Secondary | ICD-10-CM | POA: Diagnosis not present

## 2015-09-25 DIAGNOSIS — I10 Essential (primary) hypertension: Secondary | ICD-10-CM | POA: Diagnosis not present

## 2015-09-25 DIAGNOSIS — K4030 Unilateral inguinal hernia, with obstruction, without gangrene, not specified as recurrent: Secondary | ICD-10-CM | POA: Diagnosis not present

## 2015-09-25 DIAGNOSIS — K5909 Other constipation: Secondary | ICD-10-CM | POA: Diagnosis not present

## 2015-09-25 DIAGNOSIS — E876 Hypokalemia: Secondary | ICD-10-CM | POA: Diagnosis not present

## 2015-09-25 DIAGNOSIS — I482 Chronic atrial fibrillation: Secondary | ICD-10-CM | POA: Diagnosis not present

## 2015-09-25 DIAGNOSIS — N179 Acute kidney failure, unspecified: Secondary | ICD-10-CM | POA: Diagnosis not present

## 2015-09-25 LAB — CBC
HEMATOCRIT: 34.5 % — AB (ref 39.0–52.0)
HEMOGLOBIN: 11.1 g/dL — AB (ref 13.0–17.0)
MCH: 32.2 pg (ref 26.0–34.0)
MCHC: 32.2 g/dL (ref 30.0–36.0)
MCV: 100 fL (ref 78.0–100.0)
Platelets: 270 10*3/uL (ref 150–400)
RBC: 3.45 MIL/uL — ABNORMAL LOW (ref 4.22–5.81)
RDW: 13 % (ref 11.5–15.5)
WBC: 12.7 10*3/uL — ABNORMAL HIGH (ref 4.0–10.5)

## 2015-09-25 LAB — BASIC METABOLIC PANEL
Anion gap: 5 (ref 5–15)
BUN: 31 mg/dL — AB (ref 6–20)
CHLORIDE: 108 mmol/L (ref 101–111)
CO2: 28 mmol/L (ref 22–32)
Calcium: 7.4 mg/dL — ABNORMAL LOW (ref 8.9–10.3)
Creatinine, Ser: 2.31 mg/dL — ABNORMAL HIGH (ref 0.61–1.24)
GFR, EST AFRICAN AMERICAN: 30 mL/min — AB (ref >60)
GFR, EST NON AFRICAN AMERICAN: 26 mL/min — AB (ref >60)
Glucose, Bld: 140 mg/dL — ABNORMAL HIGH (ref 65–99)
POTASSIUM: 4.8 mmol/L (ref 3.5–5.1)
SODIUM: 141 mmol/L (ref 135–145)

## 2015-09-25 LAB — PROTIME-INR
INR: 1.43
Prothrombin Time: 17.6 seconds — ABNORMAL HIGH (ref 11.4–15.2)

## 2015-09-25 LAB — HEPARIN LEVEL (UNFRACTIONATED): HEPARIN UNFRACTIONATED: 0.32 [IU]/mL (ref 0.30–0.70)

## 2015-09-25 MED ORDER — METRONIDAZOLE IN NACL 5-0.79 MG/ML-% IV SOLN
500.0000 mg | Freq: Three times a day (TID) | INTRAVENOUS | Status: DC
  Administered 2015-09-25 – 2015-09-27 (×5): 500 mg via INTRAVENOUS
  Filled 2015-09-25 (×5): qty 100

## 2015-09-25 MED ORDER — ONDANSETRON HCL 4 MG/2ML IJ SOLN: 4.0000 mg | Freq: Four times a day (QID) | INTRAMUSCULAR | Status: DC | PRN

## 2015-09-25 MED ORDER — PHENOL 1.4 % MT LIQD
1.0000 | OROMUCOSAL | Status: DC | PRN
  Filled 2015-09-25: qty 177

## 2015-09-25 MED ORDER — DEXTROSE-NACL 5-0.9 % IV SOLN
INTRAVENOUS | Status: DC
  Administered 2015-09-25 – 2015-09-27 (×5): via INTRAVENOUS

## 2015-09-25 MED ORDER — METOPROLOL TARTRATE 5 MG/5ML IV SOLN
5.0000 mg | Freq: Three times a day (TID) | INTRAVENOUS | Status: DC
  Administered 2015-09-25 – 2015-10-01 (×16): 5 mg via INTRAVENOUS
  Filled 2015-09-25 (×17): qty 5

## 2015-09-25 NOTE — Progress Notes (Signed)
Pt on cardizem gtt at 15 mg/hr. HR has been in low 100's to low teens. No new complaints through out the night, will continue to monitor.  09/25/15  5:26 AM  Maleeah Crossman Rica Mote, RN

## 2015-09-25 NOTE — Progress Notes (Signed)
PROGRESS NOTE    [name] [name]  BMW:[id number] DOB: June 16, 1940 DOA: 09/18/2015 PCP: [name], MD   Brief Narrative:  Patient is a 75 year old man with a history of atrial fibrillation on Coumadin, CK D, HTN, cardiomyopathy, hypothyroidism, who presented to the ED on 09/18/2015 with persistent nausea and vomiting. He had been seen 3 days prior to admission in the ED for the same. At home, he had an apparent orthostatic syncopal episode that lasted a few seconds while he attempted to stand. In the ED, he was afebrile, mildly tachycardic, but with a low-normal blood pressure. His labs are noted for an INR of 4.88, lipase of 66, BUN of 95, creatinine of 6.53, normal LFTs, and a lactic acid of 1.8. He was admitted for further evaluation and management.   Assessment & Plan:   Principal Problem:   AKI (acute kidney injury) ([place]) Active Problems:   Orthostatic syncope   Vomiting   Constipation   SBO (small bowel obstruction) (HCC)   Incarcerated right inguinal hernia   Atrial fibrillation ([place])   Long term current use of anticoagulant therapy   Essential hypertension   CKD (chronic kidney disease) stage 3, GFR 30-59 ml/min   Dehydration   Cardiomyopathy ([place])   Hypokalemia   Warfarin-induced coagulopathy (HCC)   Hypothyroidism   Low-grade fever. Patient has developed a low-grade fever on 9/15. His white blood cell count has trended up to 12.7. He does have some lower abdominal pain. His repeat urinalysis was unremarkable. His chest x-ray revealed no pneumonia. Blood cultures were ordered and are pending.  -In light of the small bowel obstruction and incarcerated inguinal hernia, will start Flagyl empirically.  Incarcerated right inguinal hernia causing small bowel obstruction. Patient's lipase and LFTs were within normal limits on admission. CT scan of the abdomen/pelvis was ordered for evaluation of persistent abdominal distention and vomiting. It revealed a high-grade bowel  obstruction due to incarcerated right inguinal hernia. His lactic acid level was within normal limits. -NG tube was inserted and it drained a copious amount of green bilious fluid. IV Pepcid was started empirically. Zofran was changed to 4 mg every 6 hours scheduled>>> changed to when necessary on 09/25/15. -Dr. Arnoldo Morale was consulted. He did not believe the patient had evidence of strangulation or gangrenous changes on exam. Patient will need a right inguinal herniorrhaphy prior to discharge. -Due to low-grade fever and elevated white blood cell count, will start Flagyl empirically.  Acute kidney injury superimposed on stage III chronic kidney disease. The patient's baseline creatinine per chart review range from 1.73-2.3. On admission, it was 6.53. Cozaar and Lasix were held on admission. He was started on vigorous IV fluids. His ins and outs were monitored. His urinalysis was suggestive of a UTI. He was started on Rocephin. -His renal function/creatinine is slowly improving. Creatinine now at 2.31. We'll continue IV fluids and holding ARB/diuretic. -Etiology is likely from prerenal azotemia/dehydration from poor oral intake and vomiting and/or ATN from the SBO.  Warfarin coagulopathy. Patient is treated with Coumadin chronically for A. fib. His INR was 4.88 on admission. It increased to a high of 7.36. He denies any evidence of GI or GU bleeding. -Due to the impending surgery, he was given 5 mg of vitamin K 1 and 1 unit of fresh frozen plasma. -When his INR fell below 2, IV heparin was started per pharmacy.   Chronic atrial fibrillation>> RVR. Patient has a history of chronic atrial fibrillation, treated with Coumadin and Coreg. His INR  was supratherapeutic at 4.88 on admission. Coreg was held following NG tube insertion. He was started on metoprolol 5 mg IV every 4 hours. -The patient was transferred to the stepdown unit and started on a diltiazem drip and IV digoxin when his heart rate increased  to the 150s to the 170s. We'll continue IV metoprolol.  Reported history of cardiomyopathy. Per echo on 08/30/15, his EF was 50-55% with no regional wall motion abnormalities and normal diastolic function. His PA pressure was elevated at 41 mmHg.  Hypertension. Patient is treated chronically with Cozaar and Coreg. Cozaar was held due to soft blood pressures. We'll continue to monitor. IV metoprolol was started to replace Coreg while the patient is nothing by mouth.  Orthostatic syncope. Neurologically, the patient is intact. Apparently, when he stood at home, he had a brief syncopal episode. -We'll continue IV fluid hydration.  Query UTI. Patient's urinalysis was consistent with infection. However, the urine culture revealed multiple species. -We'll discontinue Rocephin.  Hypokalemia. The patient's serum potassium was within normal limits on admission, but has drifted down to 2.9, possibly from vomiting. -Patient was given several runs of IV potassium and potassium was added to his maintenance IV fluids. His potassium has improved. We'll continue to monitor.  Hypothyroidism. The patient was continued on Synthroid. His TSH was within normal limits. -With his nothing by mouth status and NG tube, Synthroid was changed to IV.  Hyperglycemia This is secondary to the dextrose and IV fluids. His venous glucose has been consistently below 150. If it increases to above 150 consistently, will start sliding scale NovoLog.     DVT prophylaxis: Coumadin>>> being held due to coagulopathy. Code Status: Full code Family Communication: Discussed with sons Disposition Plan: Discharged home when clinically appropriate   Consultants:   Gen. surgery  Procedures:   None  Antimicrobials:  Flagyl 09/25/15>>  Rocephin >> 09/25/15   Subjective: Patient has no complaints of pain except for the right inguinal region; he also expresses some discomfort over the lower abdomen. He denies cough or  pain with urination. He has not had a bowel movement yet.  Objective: Vitals:   09/25/15 0500 09/25/15 0600 09/25/15 0700 09/25/15 0741  BP: 100/61 95/63 96/65  (!) 126/98  Pulse: (!) 101 97 (!) 107 71  Resp: 18 19 (!) 24 (!) 24  Temp:    98.2 F (36.8 C)  TempSrc:    Oral  SpO2: 94% 94% 100% 97%  Weight:      Height:        Intake/Output Summary (Last 24 hours) at 09/25/15 0928 Last data filed at 09/25/15 0700  Gross per 24 hour  Intake          5428.18 ml  Output             3410 ml  Net          2018.18 ml   Filed Weights   09/24/15 0414 09/24/15 1500 09/25/15 0400  Weight: 109.5 kg (241 lb 8 oz) 114.7 kg (252 lb 13.9 oz) 114.5 kg (252 lb 6.8 oz)    Examination:  General exam: Appears calm and comfortable  HEENT: NG tube draining small amount of green bilious fluid. Respiratory system: Clear anteriorly, but rare crackles in the bases. Respiratory effort normal. Cardiovascular system: Irregular, irregular, with soft systolic murmur. No pedal edema. Gastrointestinal system: Abdomen is obese, no real bowel sounds; significantly less distended compared to 09/22/15; mildly tender over the hypogastrium and right inguinal region without rigidity or  peritoneal signs. NG-draining greenish bilious fluid.. Central nervous system: Alert and oriented. No focal neurological deficits. Extremities:  No acute hot red joints. Skin: Fair turgor. Psychiatry: Judgement and insight appear normal. Mood & affect appropriate.     Data Reviewed: I have personally reviewed following labs and imaging studies  CBC:  Recent Labs Lab 09/20/15 0543 09/21/15 0612 09/22/15 0553 09/23/15 0651 09/25/15 0519  WBC 7.7 8.0 9.4 11.1* 12.7*  HGB 14.3 14.3 13.5 12.7* 11.1*  HCT 42.4 43.5 41.3 38.7* 34.5*  MCV 96.1 98.0 97.9 97.2 100.0  PLT 249 271 293 257 810   Basic Metabolic Panel:  Recent Labs Lab 09/21/15 0612 09/22/15 0553 09/23/15 0651 09/24/15 0548 09/25/15 0519  NA 140 140 138 140  141  K 3.3* 2.9* 4.1 4.1 4.8  CL 90* 91* 99* 102 108  CO2 35* 36* 30 29 28   GLUCOSE 130* 135* 115* 147* 140*  BUN 91* 87* 75* 52* 31*  CREATININE 4.30* 3.62* 3.16* 2.67* 2.31*  CALCIUM 8.1* 7.7* 7.3* 7.2* 7.4*  MG  --   --   --  2.0  --    GFR: Estimated Creatinine Clearance: 37.2 mL/min (by C-G formula based on SCr of 2.31 mg/dL (H)). Liver Function Tests:  Recent Labs Lab 09/23/15 0651  AST 17  ALT 17  ALKPHOS 44  BILITOT 0.8  PROT 6.0*  ALBUMIN 2.6*   No results for input(s): LIPASE, AMYLASE in the last 168 hours. No results for input(s): AMMONIA in the last 168 hours. Coagulation Profile:  Recent Labs Lab 09/21/15 0612 09/22/15 0553 09/23/15 0651 09/24/15 0548 09/25/15 0519  INR 4.55* 5.95* 7.36* 1.74 1.43   Cardiac Enzymes: No results for input(s): CKTOTAL, CKMB, CKMBINDEX, TROPONINI in the last 168 hours. BNP (last 3 results) No results for input(s): PROBNP in the last 8760 hours. HbA1C: No results for input(s): HGBA1C in the last 72 hours. CBG: No results for input(s): GLUCAP in the last 168 hours. Lipid Profile: No results for input(s): CHOL, HDL, LDLCALC, TRIG, CHOLHDL, LDLDIRECT in the last 72 hours. Thyroid Function Tests:  Recent Labs  09/23/15 0651  TSH 1.673   Anemia Panel: No results for input(s): VITAMINB12, FOLATE, FERRITIN, TIBC, IRON, RETICCTPCT in the last 72 hours. Sepsis Labs:  Recent Labs Lab 09/22/15 1657  LATICACIDVEN 1.2    Recent Results (from the past 240 hour(s))  Urine culture     Status: Abnormal   Collection Time: 09/18/15  2:25 AM  Result Value Ref Range Status   Specimen Description URINE, CLEAN CATCH  Final   Special Requests NONE  Final   Culture MULTIPLE SPECIES PRESENT, SUGGEST RECOLLECTION (A)  Final   Report Status 09/20/2015 FINAL  Final  Urine culture     Status: None   Collection Time: 09/21/15 10:51 AM  Result Value Ref Range Status   Specimen Description URINE, CLEAN CATCH  Final   Special  Requests NONE  Final   Culture NO GROWTH Performed at [facility]   Final   Report Status 09/23/2015 FINAL  Final  MRSA PCR Screening     Status: None   Collection Time: 09/24/15  2:45 PM  Result Value Ref Range Status   MRSA by PCR NEGATIVE NEGATIVE Final    Comment:        The GeneXpert MRSA Assay (FDA approved for NASAL specimens only), is one component of a comprehensive MRSA colonization surveillance program. It is not intended to diagnose MRSA infection nor to guide or monitor  treatment for MRSA infections.   Culture, blood (Routine X 2) w Reflex to ID Panel     Status: None (Preliminary result)   Collection Time: 09/24/15  5:37 PM  Result Value Ref Range Status   Specimen Description RIGHT ANTECUBITAL  Final   Special Requests BOTTLES DRAWN AEROBIC AND ANAEROBIC [facility] EACH  Final   Culture PENDING  Incomplete   Report Status PENDING  Incomplete  Culture, blood (Routine X 2) w Reflex to ID Panel     Status: None (Preliminary result)   Collection Time: 09/24/15  7:05 PM  Result Value Ref Range Status   Specimen Description BLOOD RIGHT HAND  Final   Special Requests BOTTLES DRAWN AEROBIC AND ANAEROBIC [facility] EACH  Final   Culture PENDING  Incomplete   Report Status PENDING  Incomplete         Radiology Studies: Dg Chest Port 1 View  Result Date: 09/24/2015 CLINICAL DATA:  Fever and shortness of breath. EXAM: PORTABLE CHEST 1 VIEW COMPARISON:  09/20/2015 and 02/03/2009 radiographs FINDINGS: The cardiomediastinal silhouette is unchanged. Elevation of the right hemidiaphragm again noted. There is no evidence of focal airspace disease, pulmonary edema, suspicious pulmonary nodule/mass, pleural effusion, or pneumothorax. No acute bony abnormalities are identified. IMPRESSION: No evidence of acute cardiopulmonary disease. Electronically Signed   By: Margarette Canada M.D.   On: 09/24/2015 17:53        Scheduled Meds: . digoxin  0.125 mg Intravenous Daily  . famotidine  (PEPCID) IV  20 mg Intravenous Q24H  . levothyroxine  25 mcg Intravenous Daily  . metoprolol  5 mg Intravenous Q4H while awake  . ondansetron (ZOFRAN) IV  4 mg Intravenous Q6H  . polyethylene glycol  17 g Oral Daily   Continuous Infusions: . dextrose 5 % and 0.9 % NaCl with KCl 20 mEq/L 125 mL/hr at 09/25/15 0700  . diltiazem (CARDIZEM) infusion 13 mg/hr (09/25/15 0812)  . heparin 1,600 Units/hr (09/25/15 0700)     LOS: 7 days    Time spent: 27 minutes    Rexene Alberts, MD [facility] Pager [phone]  If 7PM-7AM, please contact night-coverage [email or web address] Password [facility] 09/25/2015, 9:28 AM

## 2015-09-25 NOTE — Progress Notes (Signed)
[place] for Heparin (coumadin on hold for OR) Indication: atrial fibrillation  No Known Allergies  Patient Measurements: Height: 6\' 2"  (188 cm) Weight: 252 lb 6.8 oz (114.5 kg) IBW/kg (Calculated) : 82.2  Vital Signs: Temp: 98.2 F (36.8 C) (09/16 0741) Temp Source: Oral (09/16 0741) BP: 126/98 (09/16 0741) Pulse Rate: 71 (09/16 0741)  Labs:  Recent Labs  09/23/15 0651 09/24/15 0548 09/24/15 1740 09/25/15 0519  HGB 12.7*  --   --  11.1*  HCT 38.7*  --   --  34.5*  PLT 257  --   --  270  LABPROT 65.2* 20.6*  --  17.6*  INR 7.36* 1.74  --  1.43  HEPARINUNFRC  --   --  0.23* 0.32  CREATININE 3.16* 2.67*  --  2.31*   Estimated Creatinine Clearance: 37.2 mL/min (by C-G formula based on SCr of 2.31 mg/dL (H)).  Medical History: Past Medical History:  Diagnosis Date  . AF (atrial fibrillation) ([place])   . Cardiomyopathy ([place])    h/o  . Chronic kidney disease, stage 3   . Dysrhythmia   . Hypothyroidism   . Pneumonia   . Prostate cancer ([place])   . Shortness of breath   . Systemic hypertension     Medications:  Prescriptions Prior to Admission  Medication Sig Dispense Refill Last Dose  . Calcium Carbonate-Vitamin D (CALCIUM 600+D) 600-400 MG-UNIT tablet Take 1 tablet by mouth 2 (two) times daily.   09/17/2015 at Unknown time  . carvedilol (COREG) 25 MG tablet Take 37.5 mg by mouth 2 (two) times daily with a meal.   09/17/2015 at 1900  . furosemide (LASIX) 20 MG tablet Take 1 tablet (20 mg total) by mouth daily. (Patient taking differently: Take 20 mg by mouth every other day. ) 30 tablet 0 Past Week at Unknown time  . levothyroxine (SYNTHROID, LEVOTHROID) 50 MCG tablet Take 1 tablet (50 mcg total) by mouth every morning. 30 tablet 6 09/17/2015 at Unknown time  . losartan (COZAAR) 25 MG tablet Take 25 mg by mouth daily.   09/17/2015 at Unknown time  . Vitamin D, Ergocalciferol, (DRISDOL) 50000 units CAPS capsule Take 50,000 Units by mouth  every 30 (thirty) days.   Past Month at Unknown time  . acetaminophen (TYLENOL) 500 MG tablet Take 1,000 mg by mouth every 6 (six) hours as needed for mild pain, moderate pain or headache.   Unknown at Unknown time  . warfarin (COUMADIN) 4 MG tablet Take 4 mg by mouth every evening.   Unknown at Unknown time    Assessment: Pt received  Vit K 5 mg sq on 9/14 and INR is now subtherapeutic.  Heparin drip started.  Heparin level this am is therapeutic Goal of Therapy:  Monitor platelets by anticoagulation protocol: Yes  Heparin level 0.3-0.7 units/ml   Plan:  Continue heparin drip at 1600 units/hr Daily HL and CBC while on heparin Monitor for bleeding coomplications  Excell Seltzer, Pharm D Clinical Pharmacist 09/25/2015,8:40 AM

## 2015-09-26 DIAGNOSIS — I482 Chronic atrial fibrillation: Secondary | ICD-10-CM | POA: Diagnosis not present

## 2015-09-26 DIAGNOSIS — K4030 Unilateral inguinal hernia, with obstruction, without gangrene, not specified as recurrent: Secondary | ICD-10-CM | POA: Diagnosis not present

## 2015-09-26 DIAGNOSIS — I10 Essential (primary) hypertension: Secondary | ICD-10-CM | POA: Diagnosis not present

## 2015-09-26 DIAGNOSIS — E876 Hypokalemia: Secondary | ICD-10-CM | POA: Diagnosis not present

## 2015-09-26 DIAGNOSIS — N179 Acute kidney failure, unspecified: Secondary | ICD-10-CM | POA: Diagnosis not present

## 2015-09-26 DIAGNOSIS — K5909 Other constipation: Secondary | ICD-10-CM | POA: Diagnosis not present

## 2015-09-26 DIAGNOSIS — N183 Chronic kidney disease, stage 3 (moderate): Secondary | ICD-10-CM | POA: Diagnosis not present

## 2015-09-26 LAB — CBC
HCT: 33.4 % — ABNORMAL LOW (ref 39.0–52.0)
HEMOGLOBIN: 10.5 g/dL — AB (ref 13.0–17.0)
MCH: 32.1 pg (ref 26.0–34.0)
MCHC: 31.4 g/dL (ref 30.0–36.0)
MCV: 102.1 fL — ABNORMAL HIGH (ref 78.0–100.0)
PLATELETS: 313 10*3/uL (ref 150–400)
RBC: 3.27 MIL/uL — ABNORMAL LOW (ref 4.22–5.81)
RDW: 12.6 % (ref 11.5–15.5)
WBC: 12 10*3/uL — ABNORMAL HIGH (ref 4.0–10.5)

## 2015-09-26 LAB — BASIC METABOLIC PANEL
Anion gap: 3 — ABNORMAL LOW (ref 5–15)
BUN: 20 mg/dL (ref 6–20)
CALCIUM: 7.4 mg/dL — AB (ref 8.9–10.3)
CO2: 25 mmol/L (ref 22–32)
Chloride: 112 mmol/L — ABNORMAL HIGH (ref 101–111)
Creatinine, Ser: 1.89 mg/dL — ABNORMAL HIGH (ref 0.61–1.24)
GFR calc Af Amer: 38 mL/min — ABNORMAL LOW (ref >60)
GFR, EST NON AFRICAN AMERICAN: 33 mL/min — AB (ref >60)
GLUCOSE: 126 mg/dL — AB (ref 65–99)
POTASSIUM: 4.3 mmol/L (ref 3.5–5.1)
SODIUM: 140 mmol/L (ref 135–145)

## 2015-09-26 LAB — HEPARIN LEVEL (UNFRACTIONATED)
HEPARIN UNFRACTIONATED: 0.28 [IU]/mL — AB (ref 0.30–0.70)
HEPARIN UNFRACTIONATED: 0.29 [IU]/mL — AB (ref 0.30–0.70)

## 2015-09-26 LAB — PROTIME-INR
INR: 1.34
PROTHROMBIN TIME: 16.7 s — AB (ref 11.4–15.2)

## 2015-09-26 MED ORDER — CHLORHEXIDINE GLUCONATE CLOTH 2 % EX PADS: 6.0000 | MEDICATED_PAD | Freq: Once | CUTANEOUS | Status: AC

## 2015-09-26 MED ORDER — CHLORHEXIDINE GLUCONATE CLOTH 2 % EX PADS
6.0000 | MEDICATED_PAD | Freq: Once | CUTANEOUS | Status: AC
  Administered 2015-09-26: 6 via TOPICAL

## 2015-09-26 MED ORDER — CIPROFLOXACIN IN D5W 400 MG/200ML IV SOLN
400.0000 mg | Freq: Two times a day (BID) | INTRAVENOUS | Status: DC
  Administered 2015-09-26 – 2015-09-27 (×3): 400 mg via INTRAVENOUS
  Filled 2015-09-26 (×3): qty 200

## 2015-09-26 NOTE — Progress Notes (Signed)
Pharmacy Antibiotic Note  [name] [name] is a 75 y.o. male admitted on 09/18/2015 with intra-abdominal infection.  Pharmacy has been consulted for cipro dosing.  Plan: Cipro 400 mg IV q12 hours F/u renal function, cultures and clinical course  Height: 6\' 2"  (188 cm) Weight: 246 lb 11.1 oz (111.9 kg) IBW/kg (Calculated) : 82.2  Temp (24hrs), Avg:98.4 F (36.9 C), Min:97.4 F (36.3 C), Max:99.3 F (37.4 C)   Recent Labs Lab 09/21/15 0612 09/22/15 0553 09/22/15 1657 09/23/15 0651 09/24/15 0548 09/25/15 0519 09/26/15 0400  WBC 8.0 9.4  --  11.1*  --  12.7* 12.0*  CREATININE 4.30* 3.62*  --  3.16* 2.67* 2.31* 1.89*  LATICACIDVEN  --   --  1.2  --   --   --   --     Estimated Creatinine Clearance: 44.9 mL/min (by C-G formula based on SCr of 1.89 mg/dL (H)).    No Known Allergies  Antimicrobials this admission: 9/16 flagyl >>  cipro 9/17 >>    Thank you for allowing pharmacy to be a part of this patient's care.  Excell Seltzer [place] 09/26/2015 9:16 AM

## 2015-09-26 NOTE — Progress Notes (Signed)
  Subjective: Patient resting currently. Denies any abdominal pain.  Objective: Vital signs in last 24 hours: Temp:  [97.4 F (36.3 C)-99.3 F (37.4 C)] 98 F (36.7 C) (09/17 0724) Pulse Rate:  [53-114] 99 (09/17 0724) Resp:  [17-30] 17 (09/17 0724) BP: (85-136)/(64-115) 111/81 (09/17 0724) SpO2:  [89 %-100 %] 97 % (09/17 0724) Weight:  [111.9 kg (246 lb 11.1 oz)] 111.9 kg (246 lb 11.1 oz) (09/17 0500) Last BM Date: 09/12/15 (Per pt)  Intake/Output from previous day: 09/16 0701 - 09/17 0700 In: 1796.3 [I.V.:1546.3; IV Piggyback:250] Out: 7654 [Urine:975; Emesis/NG output:800] Intake/Output this shift: Total I/O In: 1735 [I.V.:1735] Out: 1400 [Urine:300; Emesis/NG output:1100]  General appearance: alert, cooperative, appears stated age and no distress Resp: clear to auscultation bilaterally Cardio: irregularly irregular rhythm GI: Soft. No specific tenderness noted in the right anal region. No abdominal distention noted.  Lab Results:   Recent Labs  09/25/15 0519 09/26/15 0400  WBC 12.7* 12.0*  HGB 11.1* 10.5*  HCT 34.5* 33.4*  PLT 270 313   BMET  Recent Labs  09/25/15 0519 09/26/15 0400  NA 141 140  K 4.8 4.3  CL 108 112*  CO2 28 25  GLUCOSE 140* 126*  BUN 31* 20  CREATININE 2.31* 1.89*  CALCIUM 7.4* 7.4*   PT/INR  Recent Labs  09/25/15 0519 09/26/15 0400  LABPROT 17.6* 16.7*  INR 1.43 1.34    Studies/Results: Dg Chest Port 1 View  Result Date: 09/24/2015 CLINICAL DATA:  Fever and shortness of breath. EXAM: PORTABLE CHEST 1 VIEW COMPARISON:  09/20/2015 and 02/03/2009 radiographs FINDINGS: The cardiomediastinal silhouette is unchanged. Elevation of the right hemidiaphragm again noted. There is no evidence of focal airspace disease, pulmonary edema, suspicious pulmonary nodule/mass, pleural effusion, or pneumothorax. No acute bony abnormalities are identified. IMPRESSION: No evidence of acute cardiopulmonary disease. Electronically Signed   By:  Margarette Canada M.D.   On: 09/24/2015 17:53    Anti-infectives: Anti-infectives    Start     Dose/Rate Route Frequency Ordered Stop   09/26/15 0915  ciprofloxacin (CIPRO) IVPB 400 mg     400 mg 200 mL/hr over 60 Minutes Intravenous Every 12 hours 09/26/15 0914     09/25/15 1030  metroNIDAZOLE (FLAGYL) IVPB 500 mg     500 mg 100 mL/hr over 60 Minutes Intravenous Every 8 hours 09/25/15 0934     09/19/15 0400  cefTRIAXone (ROCEPHIN) 1 g in dextrose 5 % 50 mL IVPB     1 g 100 mL/hr over 30 Minutes Intravenous Every 24 hours 09/18/15 0522 09/23/15 0441   09/18/15 0415  cefTRIAXone (ROCEPHIN) 1 g in dextrose 5 % 50 mL IVPB     1 g 100 mL/hr over 30 Minutes Intravenous  Once 09/18/15 0406 09/18/15 0457      Assessment/Plan: Impression: Incarcerated right anal hernia. Patient is in the ICU for atrial fibrillation which is controlled with Cardizem drip. Patient is also on a heparin drip. Plan: We will proceed with right inguinal herniorrhaphy with mesh, possible small bowel resection tomorrow. The risks and benefits of the procedure including bleeding, infection, cardiopulmonary difficulties, and the possibility of a bowel resection were fully explained to the patient, who gave informed consent. I have contacted pharmacy and they will stop the heparin drip at midnight.  LOS: 8 days    Jerzy Crotteau A 09/26/2015

## 2015-09-26 NOTE — Progress Notes (Addendum)
[place] for Heparin (coumadin on hold for OR) Indication: atrial fibrillation  No Known Allergies  Patient Measurements: Height: 6\' 2"  (188 cm) Weight: 246 lb 11.1 oz (111.9 kg) IBW/kg (Calculated) : 82.2  Vital Signs: Temp: 98 F (36.7 C) (09/17 0724) Temp Source: Oral (09/17 0724) BP: 111/81 (09/17 0724) Pulse Rate: 99 (09/17 0724)  Labs:  Recent Labs  09/24/15 0548 09/24/15 1740 09/25/15 0519 09/26/15 0400  HGB  --   --  11.1* 10.5*  HCT  --   --  34.5* 33.4*  PLT  --   --  270 313  LABPROT 20.6*  --  17.6* 16.7*  INR 1.74  --  1.43 1.34  HEPARINUNFRC  --  0.23* 0.32 0.28*  CREATININE 2.67*  --  2.31* 1.89*   Estimated Creatinine Clearance: 44.9 mL/min (by C-G formula based on SCr of 1.89 mg/dL (H)).  Medical History: Past Medical History:  Diagnosis Date  . AF (atrial fibrillation) ([place])   . Cardiomyopathy ([place])    h/o  . Chronic kidney disease, stage 3   . Dysrhythmia   . Hypothyroidism   . Pneumonia   . Prostate cancer ([place])   . Shortness of breath   . Systemic hypertension     Medications:  Prescriptions Prior to Admission  Medication Sig Dispense Refill Last Dose  . Calcium Carbonate-Vitamin D (CALCIUM 600+D) 600-400 MG-UNIT tablet Take 1 tablet by mouth 2 (two) times daily.   09/17/2015 at Unknown time  . carvedilol (COREG) 25 MG tablet Take 37.5 mg by mouth 2 (two) times daily with a meal.   09/17/2015 at 1900  . furosemide (LASIX) 20 MG tablet Take 1 tablet (20 mg total) by mouth daily. (Patient taking differently: Take 20 mg by mouth every other day. ) 30 tablet 0 Past Week at Unknown time  . levothyroxine (SYNTHROID, LEVOTHROID) 50 MCG tablet Take 1 tablet (50 mcg total) by mouth every morning. 30 tablet 6 09/17/2015 at Unknown time  . losartan (COZAAR) 25 MG tablet Take 25 mg by mouth daily.   09/17/2015 at Unknown time  . Vitamin D, Ergocalciferol, (DRISDOL) 50000 units CAPS capsule Take 50,000 Units by mouth  every 30 (thirty) days.   Past Month at Unknown time  . acetaminophen (TYLENOL) 500 MG tablet Take 1,000 mg by mouth every 6 (six) hours as needed for mild pain, moderate pain or headache.   Unknown at Unknown time  . warfarin (COUMADIN) 4 MG tablet Take 4 mg by mouth every evening.   Unknown at Unknown time    Assessment: Pt received  Vit K 5 mg sq on 9/14 and INR is now subtherapeutic.  Heparin drip started.  Heparin level this am is subtherapeutic Goal of Therapy:  Monitor platelets by anticoagulation protocol: Yes  Heparin level 0.3-0.7 units/ml   Plan:  Increase heparin drip to 1700 units/hr Check HL later today Daily HL and CBC while on heparin Monitor for bleeding coomplications  Excell Seltzer, Pharm D Clinical Pharmacist 09/26/2015,8:21 AM   Addum:  Heparin level 0.29 units/ml.  Increase drip to 1800 units/hr.  Drip off at midnight for OR

## 2015-09-26 NOTE — Progress Notes (Signed)
PROGRESS NOTE    [name] [name]  QGB:[id number] DOB: 02/24/1940 DOA: 09/18/2015 PCP: [name], MD   Brief Narrative:  Patient is a 75 year old man with a history of atrial fibrillation on Coumadin, CK D, HTN, cardiomyopathy, hypothyroidism, who presented to the ED on 09/18/2015 with persistent nausea and vomiting. He had been seen 3 days prior to admission in the ED for the same. At home, he had an apparent orthostatic syncopal episode that lasted a few seconds while he attempted to stand. In the ED, he was afebrile, mildly tachycardic, but with a low-normal blood pressure. His labs are noted for an INR of 4.88, lipase of 66, BUN of 95, creatinine of 6.53, normal LFTs, and a lactic acid of 1.8. He was admitted for further evaluation and management.   Assessment & Plan:   Principal Problem:   AKI (acute kidney injury) ([place]) Active Problems:   Orthostatic syncope   Vomiting   Constipation   SBO (small bowel obstruction) ([facility])   Incarcerated right inguinal hernia   Atrial fibrillation ([place])   Long term current use of anticoagulant therapy   Essential hypertension   CKD (chronic kidney disease) stage 3, GFR 30-59 ml/min   Dehydration   Cardiomyopathy ([place])   Hypokalemia   Warfarin-induced coagulopathy ([facility])   Hypothyroidism   Low-grade fever. Patient has developed a low-grade fever on 9/15. His white blood cell count has trended up to 12.7. He does have some lower abdominal pain. His repeat urinalysis was unremarkable. His chest x-ray revealed no pneumonia. Blood cultures were ordered and are pending.  -In light of the small bowel obstruction and incarcerated inguinal hernia, Flagyl was started empirically on 9/16. -Will start IV Cipro as his white blood cell count has not improved much.  Incarcerated right inguinal hernia causing small bowel obstruction. Patient's lipase and LFTs were within normal limits on admission. CT scan of the abdomen/pelvis was ordered for  evaluation of persistent abdominal distention and vomiting. It revealed a high-grade bowel obstruction due to incarcerated right inguinal hernia. His lactic acid level was within normal limits. -NG tube was inserted and it drained a copious amount of green bilious fluid. IV Pepcid was started empirically. Zofran was changed to 4 mg every 6 hours scheduled>>> changed to when necessary on 09/25/15. -Dr. Arnoldo Morale was consulted. He did not believe the patient had evidence of strangulation or gangrenous changes on exam. Patient will need a right inguinal herniorrhaphy prior to discharge. -Due to low-grade fever and elevated white blood cell count, Flagyl was started empirically. Will also add Cipro.  Acute kidney injury superimposed on stage III chronic kidney disease. The patient's baseline creatinine per chart review range from 1.73-2.3. On admission, it was 6.53. Cozaar and Lasix were held on admission. He was started on vigorous IV fluids. His ins and outs were monitored. His urinalysis was suggestive of a UTI. He was started on Rocephin. -His renal function/creatinine is slowly improving. Creatinine now at 2.31. We'll continue IV fluids and holding ARB/diuretic. -Etiology is likely from prerenal azotemia/dehydration from poor oral intake and vomiting and/or ATN from the SBO.  Warfarin coagulopathy. Patient is treated with Coumadin chronically for A. fib. His INR was 4.88 on admission. It increased to a high of 7.36. He denies any evidence of GI or GU bleeding. -Due to the impending surgery, he was given 5 mg of vitamin K 1 and 1 unit of fresh frozen plasma. -When his INR fell below 2, IV heparin was started per pharmacy.  Chronic atrial fibrillation>> RVR. Patient has a history of chronic atrial fibrillation, treated with Coumadin and Coreg. His INR was supratherapeutic at 4.88 on admission. Coreg was held following NG tube insertion. He was started on metoprolol 5 mg IV every 4 hours. -The patient  was transferred to the stepdown unit and started on a diltiazem drip and IV digoxin when his heart rate increased to the 150s to the 170s. We'll continue IV metoprolol. -Continue current management as his heart rate has improved and is stable on the current regimen.  Reported history of cardiomyopathy. Per echo on 08/30/15, his EF was 50-55% with no regional wall motion abnormalities and normal diastolic function. His PA pressure was elevated at 41 mmHg.  Hypertension. Patient is treated chronically with Cozaar and Coreg. Cozaar was held due to soft blood pressures. We'll continue to monitor. IV metoprolol was started to replace Coreg while the patient is nothing by mouth. -His blood pressure has been stable.  Orthostatic syncope. Neurologically, the patient is intact. Apparently, when he stood at home, he had a brief syncopal episode. -We'll continue IV fluid hydration.  Query UTI. Patient's urinalysis was consistent with infection. He was started on Rocephin. However, the urine culture revealed multiple species. Rocephin was discontinued after several days.  Hypokalemia. The patient's serum potassium was within normal limits on admission, but has drifted down to 2.9, possibly from vomiting. -Patient was given several runs of IV potassium and potassium was added to his maintenance IV fluids. His potassium has improved. We'll continue to monitor.  Hypothyroidism. The patient was continued on Synthroid. His TSH was within normal limits. -With his nothing by mouth status and NG tube, Synthroid was changed to IV.  Hyperglycemia This is secondary to the dextrose and IV fluids. His venous glucose has been consistently below 150. If it increases to above 150 consistently, will start sliding scale NovoLog.     DVT prophylaxis: Coumadin>>> being held due to coagulopathy. Code Status: Full code Family Communication: Discussed with sons Disposition Plan: Discharged home when clinically  appropriate   Consultants:   Gen. surgery  Procedures:   None  Antimicrobials:  Flagyl 09/25/15>>  Rocephin >> 09/25/15   Subjective: Patient said he had a "good night". He denies abdominal pain other than some soreness in his right inguinal region.  Objective: Vitals:   09/26/15 0330 09/26/15 0400 09/26/15 0500 09/26/15 0724  BP: 127/76 (!) 123/101  111/81  Pulse: (!) 106 (!) 109  99  Resp: 20 (!) 21  17  Temp:  97.4 F (36.3 C)  98 F (36.7 C)  TempSrc:  Oral  Oral  SpO2: 95% 93%  97%  Weight:   111.9 kg (246 lb 11.1 oz)   Height:        Intake/Output Summary (Last 24 hours) at 09/26/15 0749 Last data filed at 09/26/15 0727  Gross per 24 hour  Intake          1796.32 ml  Output             1975 ml  Net          -178.68 ml   Filed Weights   09/24/15 1500 09/25/15 0400 09/26/15 0500  Weight: 114.7 kg (252 lb 13.9 oz) 114.5 kg (252 lb 6.8 oz) 111.9 kg (246 lb 11.1 oz)    Examination:  General exam: Appears calm and comfortable  HEENT: NG tube draining  green bilious fluid. Respiratory system: Clear anteriorly.  Respiratory effort normal. Cardiovascular system: Irregular, irregular, with  soft systolic murmur. No pedal edema. Gastrointestinal system: Abdomen is obese, no real bowel sounds; significantly less distended compared to 09/22/15; mildly tender over the hypogastrium and right inguinal region without rigidity or peritoneal signs. NG-draining greenish bilious fluid.. Central nervous system: Alert and oriented. No focal neurological deficits. Extremities:  No acute hot red joints. Skin: Fair turgor. Psychiatry: Judgement and insight appear normal. Mood & affect appropriate.     Data Reviewed: I have personally reviewed following labs and imaging studies  CBC:  Recent Labs Lab 09/21/15 0612 09/22/15 0553 09/23/15 0651 09/25/15 0519 09/26/15 0400  WBC 8.0 9.4 11.1* 12.7* 12.0*  HGB 14.3 13.5 12.7* 11.1* 10.5*  HCT 43.5 41.3 38.7* 34.5* 33.4*    MCV 98.0 97.9 97.2 100.0 102.1*  PLT 271 293 257 270 272   Basic Metabolic Panel:  Recent Labs Lab 09/22/15 0553 09/23/15 0651 09/24/15 0548 09/25/15 0519 09/26/15 0400  NA 140 138 140 141 140  K 2.9* 4.1 4.1 4.8 4.3  CL 91* 99* 102 108 112*  CO2 36* 30 29 28 25   GLUCOSE 135* 115* 147* 140* 126*  BUN 87* 75* 52* 31* 20  CREATININE 3.62* 3.16* 2.67* 2.31* 1.89*  CALCIUM 7.7* 7.3* 7.2* 7.4* 7.4*  MG  --   --  2.0  --   --    GFR: Estimated Creatinine Clearance: 44.9 mL/min (by C-G formula based on SCr of 1.89 mg/dL (H)). Liver Function Tests:  Recent Labs Lab 09/23/15 0651  AST 17  ALT 17  ALKPHOS 44  BILITOT 0.8  PROT 6.0*  ALBUMIN 2.6*   No results for input(s): LIPASE, AMYLASE in the last 168 hours. No results for input(s): AMMONIA in the last 168 hours. Coagulation Profile:  Recent Labs Lab 09/22/15 0553 09/23/15 0651 09/24/15 0548 09/25/15 0519 09/26/15 0400  INR 5.95* 7.36* 1.74 1.43 1.34   Cardiac Enzymes: No results for input(s): CKTOTAL, CKMB, CKMBINDEX, TROPONINI in the last 168 hours. BNP (last 3 results) No results for input(s): PROBNP in the last 8760 hours. HbA1C: No results for input(s): HGBA1C in the last 72 hours. CBG: No results for input(s): GLUCAP in the last 168 hours. Lipid Profile: No results for input(s): CHOL, HDL, LDLCALC, TRIG, CHOLHDL, LDLDIRECT in the last 72 hours. Thyroid Function Tests: No results for input(s): TSH, T4TOTAL, FREET4, T3FREE, THYROIDAB in the last 72 hours. Anemia Panel: No results for input(s): VITAMINB12, FOLATE, FERRITIN, TIBC, IRON, RETICCTPCT in the last 72 hours. Sepsis Labs:  Recent Labs Lab 09/22/15 1657  LATICACIDVEN 1.2    Recent Results (from the past 240 hour(s))  Urine culture     Status: Abnormal   Collection Time: 09/18/15  2:25 AM  Result Value Ref Range Status   Specimen Description URINE, CLEAN CATCH  Final   Special Requests NONE  Final   Culture MULTIPLE SPECIES PRESENT,  SUGGEST RECOLLECTION (A)  Final   Report Status 09/20/2015 FINAL  Final  Urine culture     Status: None   Collection Time: 09/21/15 10:51 AM  Result Value Ref Range Status   Specimen Description URINE, CLEAN CATCH  Final   Special Requests NONE  Final   Culture NO GROWTH Performed at [facility]   Final   Report Status 09/23/2015 FINAL  Final  MRSA PCR Screening     Status: None   Collection Time: 09/24/15  2:45 PM  Result Value Ref Range Status   MRSA by PCR NEGATIVE NEGATIVE Final    Comment:  The GeneXpert MRSA Assay (FDA approved for NASAL specimens only), is one component of a comprehensive MRSA colonization surveillance program. It is not intended to diagnose MRSA infection nor to guide or monitor treatment for MRSA infections.   Culture, blood (Routine X 2) w Reflex to ID Panel     Status: None (Preliminary result)   Collection Time: 09/24/15  5:37 PM  Result Value Ref Range Status   Specimen Description RIGHT ANTECUBITAL  Final   Special Requests BOTTLES DRAWN AEROBIC AND ANAEROBIC [facility] EACH  Final   Culture PENDING  Incomplete   Report Status PENDING  Incomplete  Culture, blood (Routine X 2) w Reflex to ID Panel     Status: None (Preliminary result)   Collection Time: 09/24/15  7:05 PM  Result Value Ref Range Status   Specimen Description BLOOD RIGHT HAND  Final   Special Requests BOTTLES DRAWN AEROBIC AND ANAEROBIC [facility] EACH  Final   Culture PENDING  Incomplete   Report Status PENDING  Incomplete         Radiology Studies: Dg Chest Port 1 View  Result Date: 09/24/2015 CLINICAL DATA:  Fever and shortness of breath. EXAM: PORTABLE CHEST 1 VIEW COMPARISON:  09/20/2015 and 02/03/2009 radiographs FINDINGS: The cardiomediastinal silhouette is unchanged. Elevation of the right hemidiaphragm again noted. There is no evidence of focal airspace disease, pulmonary edema, suspicious pulmonary nodule/mass, pleural effusion, or pneumothorax. No acute bony  abnormalities are identified. IMPRESSION: No evidence of acute cardiopulmonary disease. Electronically Signed   By: Margarette Canada M.D.   On: 09/24/2015 17:53        Scheduled Meds: . digoxin  0.125 mg Intravenous Daily  . famotidine (PEPCID) IV  20 mg Intravenous Q24H  . levothyroxine  25 mcg Intravenous Daily  . metoprolol  5 mg Intravenous Q8H  . metronidazole  500 mg Intravenous Q8H  . polyethylene glycol  17 g Oral Daily   Continuous Infusions: . dextrose 5 % and 0.9% NaCl 125 mL/hr at 09/26/15 0518  . diltiazem (CARDIZEM) infusion 5 mg/hr (09/25/15 2015)  . heparin 1,600 Units/hr (09/25/15 2015)     LOS: 8 days    Time spent: 14 minutes    Rexene Alberts, MD [facility] Pager [phone]  If 7PM-7AM, please contact night-coverage [email or web address] Password [facility] 09/26/2015, 7:49 AM

## 2015-09-27 ENCOUNTER — Encounter (HOSPITAL_COMMUNITY): Payer: Self-pay | Admitting: *Deleted

## 2015-09-27 ENCOUNTER — Inpatient Hospital Stay (HOSPITAL_COMMUNITY): Payer: PPO | Admitting: Anesthesiology

## 2015-09-27 ENCOUNTER — Encounter (HOSPITAL_COMMUNITY)
Admission: EM | Disposition: A | Discharge status: Skilled Nursing Facility | Payer: Self-pay | Source: Home / Self Care | Attending: Internal Medicine

## 2015-09-27 DIAGNOSIS — E875 Hyperkalemia: Secondary | ICD-10-CM | POA: Diagnosis not present

## 2015-09-27 DIAGNOSIS — N179 Acute kidney failure, unspecified: Secondary | ICD-10-CM | POA: Diagnosis not present

## 2015-09-27 DIAGNOSIS — I429 Cardiomyopathy, unspecified: Secondary | ICD-10-CM | POA: Diagnosis not present

## 2015-09-27 DIAGNOSIS — E876 Hypokalemia: Secondary | ICD-10-CM | POA: Diagnosis not present

## 2015-09-27 DIAGNOSIS — I9581 Postprocedural hypotension: Secondary | ICD-10-CM | POA: Diagnosis not present

## 2015-09-27 DIAGNOSIS — K630 Abscess of intestine: Secondary | ICD-10-CM | POA: Diagnosis not present

## 2015-09-27 DIAGNOSIS — N183 Chronic kidney disease, stage 3 (moderate): Secondary | ICD-10-CM | POA: Diagnosis not present

## 2015-09-27 DIAGNOSIS — I129 Hypertensive chronic kidney disease with stage 1 through stage 4 chronic kidney disease, or unspecified chronic kidney disease: Secondary | ICD-10-CM | POA: Diagnosis not present

## 2015-09-27 DIAGNOSIS — N390 Urinary tract infection, site not specified: Secondary | ICD-10-CM | POA: Diagnosis not present

## 2015-09-27 DIAGNOSIS — K4030 Unilateral inguinal hernia, with obstruction, without gangrene, not specified as recurrent: Secondary | ICD-10-CM | POA: Diagnosis not present

## 2015-09-27 DIAGNOSIS — N170 Acute kidney failure with tubular necrosis: Secondary | ICD-10-CM | POA: Diagnosis not present

## 2015-09-27 DIAGNOSIS — I10 Essential (primary) hypertension: Secondary | ICD-10-CM | POA: Diagnosis not present

## 2015-09-27 DIAGNOSIS — E43 Unspecified severe protein-calorie malnutrition: Secondary | ICD-10-CM | POA: Diagnosis not present

## 2015-09-27 DIAGNOSIS — E872 Acidosis: Secondary | ICD-10-CM | POA: Diagnosis not present

## 2015-09-27 DIAGNOSIS — I482 Chronic atrial fibrillation: Secondary | ICD-10-CM | POA: Diagnosis not present

## 2015-09-27 DIAGNOSIS — K5909 Other constipation: Secondary | ICD-10-CM | POA: Diagnosis not present

## 2015-09-27 DIAGNOSIS — E860 Dehydration: Secondary | ICD-10-CM | POA: Diagnosis not present

## 2015-09-27 HISTORY — PX: INGUINAL HERNIA REPAIR: SHX194

## 2015-09-27 LAB — CBC WITH DIFFERENTIAL/PLATELET
BASOS PCT: 0 %
Basophils Absolute: 0 10*3/uL (ref 0.0–0.1)
EOS ABS: 0.3 10*3/uL (ref 0.0–0.7)
EOS PCT: 2 %
HEMATOCRIT: 34.2 % — AB (ref 39.0–52.0)
Hemoglobin: 10.8 g/dL — ABNORMAL LOW (ref 13.0–17.0)
LYMPHS ABS: 1.8 10*3/uL (ref 0.7–4.0)
Lymphocytes Relative: 14 %
MCH: 32.1 pg (ref 26.0–34.0)
MCHC: 31.6 g/dL (ref 30.0–36.0)
MCV: 101.8 fL — AB (ref 78.0–100.0)
MONO ABS: 1.6 10*3/uL — AB (ref 0.1–1.0)
Monocytes Relative: 12 %
NEUTROS ABS: 9.3 10*3/uL — AB (ref 1.7–7.7)
NEUTROS PCT: 72 %
PLATELETS: 337 10*3/uL (ref 150–400)
RBC: 3.36 MIL/uL — ABNORMAL LOW (ref 4.22–5.81)
RDW: 12.6 % (ref 11.5–15.5)
WBC MORPHOLOGY: INCREASED
WBC: 13 10*3/uL — ABNORMAL HIGH (ref 4.0–10.5)

## 2015-09-27 LAB — BASIC METABOLIC PANEL
Anion gap: 5 (ref 5–15)
BUN: 15 mg/dL (ref 6–20)
CALCIUM: 7.8 mg/dL — AB (ref 8.9–10.3)
CO2: 25 mmol/L (ref 22–32)
CREATININE: 1.75 mg/dL — AB (ref 0.61–1.24)
Chloride: 112 mmol/L — ABNORMAL HIGH (ref 101–111)
GFR calc non Af Amer: 36 mL/min — ABNORMAL LOW (ref >60)
GFR, EST AFRICAN AMERICAN: 42 mL/min — AB (ref >60)
Glucose, Bld: 111 mg/dL — ABNORMAL HIGH (ref 65–99)
Potassium: 4.5 mmol/L (ref 3.5–5.1)
Sodium: 142 mmol/L (ref 135–145)

## 2015-09-27 LAB — PROTIME-INR
INR: 1.31
PROTHROMBIN TIME: 16.4 s — AB (ref 11.4–15.2)

## 2015-09-27 LAB — PREPARE RBC (CROSSMATCH)

## 2015-09-27 SURGERY — REPAIR, HERNIA, INGUINAL, ADULT
Anesthesia: General | Laterality: Right

## 2015-09-27 MED ORDER — FENTANYL CITRATE (PF) 250 MCG/5ML IJ SOLN
INTRAMUSCULAR | Status: AC
  Filled 2015-09-27: qty 5

## 2015-09-27 MED ORDER — MIDAZOLAM HCL 2 MG/2ML IJ SOLN
1.0000 mg | INTRAMUSCULAR | Status: DC | PRN
Start: 2015-09-27 — End: 2015-09-27
  Administered 2015-09-27: 2 mg via INTRAVENOUS

## 2015-09-27 MED ORDER — ETOMIDATE 2 MG/ML IV SOLN
INTRAVENOUS | Status: AC
  Filled 2015-09-27: qty 10

## 2015-09-27 MED ORDER — CHLORHEXIDINE GLUCONATE 0.12 % MT SOLN
15.0000 mL | Freq: Two times a day (BID) | OROMUCOSAL | Status: DC
  Administered 2015-09-27 – 2015-10-06 (×18): 15 mL via OROMUCOSAL
  Filled 2015-09-27 (×17): qty 15

## 2015-09-27 MED ORDER — CHLORHEXIDINE GLUCONATE CLOTH 2 % EX PADS: 6.0000 | MEDICATED_PAD | Freq: Once | CUTANEOUS | Status: DC

## 2015-09-27 MED ORDER — BUPIVACAINE LIPOSOME 1.3 % IJ SUSP
INTRAMUSCULAR | Status: DC | PRN
  Administered 2015-09-27: 20 mL

## 2015-09-27 MED ORDER — LIDOCAINE HCL (CARDIAC) 10 MG/ML IV SOLN
INTRAVENOUS | Status: DC | PRN
  Administered 2015-09-27: 50 mg via INTRAVENOUS

## 2015-09-27 MED ORDER — LIDOCAINE HCL (PF) 1 % IJ SOLN
INTRAMUSCULAR | Status: AC
  Filled 2015-09-27: qty 5

## 2015-09-27 MED ORDER — HYDROMORPHONE HCL 1 MG/ML IJ SOLN
0.2500 mg | INTRAMUSCULAR | Status: DC | PRN
  Administered 2015-09-27 (×2): 0.5 mg via INTRAVENOUS
  Filled 2015-09-27: qty 1

## 2015-09-27 MED ORDER — NEOSTIGMINE METHYLSULFATE 10 MG/10ML IV SOLN: INTRAVENOUS | Status: DC | PRN

## 2015-09-27 MED ORDER — KETOROLAC TROMETHAMINE 30 MG/ML IJ SOLN
30.0000 mg | Freq: Once | INTRAMUSCULAR | Status: AC
  Administered 2015-09-27: 30 mg via INTRAVENOUS

## 2015-09-27 MED ORDER — BUPIVACAINE LIPOSOME 1.3 % IJ SUSP
INTRAMUSCULAR | Status: AC
  Filled 2015-09-27: qty 20

## 2015-09-27 MED ORDER — KETOROLAC TROMETHAMINE 30 MG/ML IJ SOLN
INTRAMUSCULAR | Status: AC
  Filled 2015-09-27: qty 1

## 2015-09-27 MED ORDER — FENTANYL CITRATE (PF) 100 MCG/2ML IJ SOLN
INTRAMUSCULAR | Status: DC | PRN
  Administered 2015-09-27 (×2): 50 ug via INTRAVENOUS
  Administered 2015-09-27: 150 ug via INTRAVENOUS
  Administered 2015-09-27 (×3): 50 ug via INTRAVENOUS

## 2015-09-27 MED ORDER — ARTIFICIAL TEARS OP OINT
TOPICAL_OINTMENT | OPHTHALMIC | Status: AC
  Filled 2015-09-27: qty 3.5

## 2015-09-27 MED ORDER — FENTANYL CITRATE (PF) 100 MCG/2ML IJ SOLN
INTRAMUSCULAR | Status: AC
  Filled 2015-09-27: qty 2

## 2015-09-27 MED ORDER — SUCCINYLCHOLINE CHLORIDE 20 MG/ML IJ SOLN
INTRAMUSCULAR | Status: DC | PRN
  Administered 2015-09-27: 120 mg via INTRAVENOUS

## 2015-09-27 MED ORDER — MIDAZOLAM HCL 2 MG/2ML IJ SOLN
INTRAMUSCULAR | Status: AC
  Filled 2015-09-27: qty 2

## 2015-09-27 MED ORDER — SODIUM CHLORIDE 0.9 % IR SOLN
Status: DC | PRN
  Administered 2015-09-27 (×4): 1000 mL

## 2015-09-27 MED ORDER — ONDANSETRON HCL 4 MG/2ML IJ SOLN
4.0000 mg | Freq: Once | INTRAMUSCULAR | Status: AC
  Administered 2015-09-27: 4 mg via INTRAVENOUS

## 2015-09-27 MED ORDER — VANCOMYCIN HCL 10 G IV SOLR
1250.0000 mg | INTRAVENOUS | Status: DC
  Administered 2015-09-28 – 2015-09-29 (×2): 1250 mg via INTRAVENOUS
  Filled 2015-09-27 (×5): qty 1250

## 2015-09-27 MED ORDER — POVIDONE-IODINE 10 % EX OINT
TOPICAL_OINTMENT | CUTANEOUS | Status: AC
  Filled 2015-09-27: qty 2

## 2015-09-27 MED ORDER — ORAL CARE MOUTH RINSE
15.0000 mL | Freq: Two times a day (BID) | OROMUCOSAL | Status: DC
  Administered 2015-09-28 – 2015-10-05 (×6): 15 mL via OROMUCOSAL

## 2015-09-27 MED ORDER — ROCURONIUM BROMIDE 100 MG/10ML IV SOLN
INTRAVENOUS | Status: DC | PRN
  Administered 2015-09-27: 10 mg via INTRAVENOUS
  Administered 2015-09-27: 20 mg via INTRAVENOUS
  Administered 2015-09-27 (×2): 10 mg via INTRAVENOUS

## 2015-09-27 MED ORDER — LACTATED RINGERS IV SOLN
INTRAVENOUS | Status: DC
  Administered 2015-09-27 – 2015-10-03 (×8): via INTRAVENOUS

## 2015-09-27 MED ORDER — GLYCOPYRROLATE 0.2 MG/ML IJ SOLN
INTRAMUSCULAR | Status: DC | PRN
  Administered 2015-09-27: 0.6 mg via INTRAVENOUS

## 2015-09-27 MED ORDER — ONDANSETRON HCL 4 MG/2ML IJ SOLN
INTRAMUSCULAR | Status: AC
  Filled 2015-09-27: qty 2

## 2015-09-27 MED ORDER — VANCOMYCIN HCL 10 G IV SOLR
2000.0000 mg | Freq: Once | INTRAVENOUS | Status: AC
  Administered 2015-09-27: 2000 mg via INTRAVENOUS
  Filled 2015-09-27: qty 2000

## 2015-09-27 MED ORDER — NEOSTIGMINE METHYLSULFATE 10 MG/10ML IV SOLN
INTRAVENOUS | Status: DC | PRN
  Administered 2015-09-27: 4 mg via INTRAVENOUS

## 2015-09-27 MED ORDER — LACTATED RINGERS IV SOLN
INTRAVENOUS | Status: DC
  Administered 2015-09-27 (×2): via INTRAVENOUS

## 2015-09-27 MED ORDER — DEXTROSE 5 % IV SOLN
INTRAVENOUS | Status: DC | PRN
  Administered 2015-09-27: 5 mg/h via INTRAVENOUS

## 2015-09-27 MED ORDER — GLYCOPYRROLATE 0.2 MG/ML IJ SOLN: INTRAMUSCULAR | Status: DC | PRN

## 2015-09-27 MED ORDER — PROPOFOL 10 MG/ML IV BOLUS
INTRAVENOUS | Status: AC
  Filled 2015-09-27: qty 20

## 2015-09-27 MED ORDER — LORAZEPAM 2 MG/ML IJ SOLN
1.0000 mg | INTRAMUSCULAR | Status: DC | PRN
  Administered 2015-09-27: 1 mg via INTRAVENOUS
  Filled 2015-09-27: qty 1

## 2015-09-27 MED ORDER — PIPERACILLIN-TAZOBACTAM 3.375 G IVPB
3.3750 g | Freq: Three times a day (TID) | INTRAVENOUS | Status: DC
Start: 2015-09-27 — End: 2015-10-02
  Administered 2015-09-27 – 2015-10-02 (×15): 3.375 g via INTRAVENOUS
  Filled 2015-09-27 (×13): qty 50

## 2015-09-27 MED ORDER — ETOMIDATE 2 MG/ML IV SOLN
INTRAVENOUS | Status: DC | PRN
  Administered 2015-09-27: 18 mg via INTRAVENOUS

## 2015-09-27 SURGICAL SUPPLY — 54 items
ADH SKN CLS APL DERMABOND .7 (GAUZE/BANDAGES/DRESSINGS)
BAG HAMPER (MISCELLANEOUS) ×3 IMPLANT
CLOTH BEACON ORANGE TIMEOUT ST (SAFETY) ×3 IMPLANT
COVER LIGHT HANDLE STERIS (MISCELLANEOUS) ×12 IMPLANT
DERMABOND ADVANCED (GAUZE/BANDAGES/DRESSINGS)
DERMABOND ADVANCED .7 DNX12 (GAUZE/BANDAGES/DRESSINGS) ×1 IMPLANT
DRAIN PENROSE 18X1/2 LTX STRL (DRAIN) ×3 IMPLANT
DRSG OPSITE POSTOP 4X10 (GAUZE/BANDAGES/DRESSINGS) ×3 IMPLANT
ELECT REM PT RETURN 9FT ADLT (ELECTROSURGICAL) ×3
ELECTRODE REM PT RTRN 9FT ADLT (ELECTROSURGICAL) ×1 IMPLANT
GAUZE SPONGE 4X4 12PLY STRL (GAUZE/BANDAGES/DRESSINGS) ×3 IMPLANT
GLOVE BIOGEL PI IND STRL 7.0 (GLOVE) ×3 IMPLANT
GLOVE BIOGEL PI INDICATOR 7.0 (GLOVE) ×6
GLOVE ECLIPSE 6.5 STRL STRAW (GLOVE) ×6 IMPLANT
GLOVE SURG SS PI 7.5 STRL IVOR (GLOVE) ×9 IMPLANT
GOWN STRL REUS W/ TWL XL LVL3 (GOWN DISPOSABLE) ×1 IMPLANT
GOWN STRL REUS W/TWL LRG LVL3 (GOWN DISPOSABLE) ×12 IMPLANT
GOWN STRL REUS W/TWL XL LVL3 (GOWN DISPOSABLE) ×6
HANDLE SUCTION POOLE (INSTRUMENTS) ×1 IMPLANT
INST SET MINOR GENERAL (KITS) ×3 IMPLANT
KIT ROOM TURNOVER APOR (KITS) ×3 IMPLANT
LIGASURE IMPACT 36 18CM CVD LR (INSTRUMENTS) ×3 IMPLANT
MANIFOLD NEPTUNE II (INSTRUMENTS) ×3 IMPLANT
MESH VENTRALEX ST 8CM LRG (Mesh General) ×3 IMPLANT
NEEDLE HYPO 21X1.5 SAFETY (NEEDLE) ×3 IMPLANT
NS IRRIG 1000ML POUR BTL (IV SOLUTION) ×12 IMPLANT
PACK ABDOMINAL MAJOR (CUSTOM PROCEDURE TRAY) ×2 IMPLANT
PACK MINOR (CUSTOM PROCEDURE TRAY) ×3 IMPLANT
PAD ARMBOARD 7.5X6 YLW CONV (MISCELLANEOUS) ×3 IMPLANT
RELOAD PROXIMATE 75MM BLUE (ENDOMECHANICALS) ×6 IMPLANT
SCRUB PCMX 4 OZ (MISCELLANEOUS) ×3 IMPLANT
SET BASIN LINEN APH (SET/KITS/TRAYS/PACK) ×3 IMPLANT
SPONGE LAP 18X18 X RAY DECT (DISPOSABLE) ×3 IMPLANT
STAPLER GUN LINEAR PROX 60 (STAPLE) ×2 IMPLANT
STAPLER PROXIMATE 75MM BLUE (STAPLE) ×2 IMPLANT
STAPLER VISISTAT (STAPLE) ×2 IMPLANT
SUCTION POOLE HANDLE (INSTRUMENTS) ×3
SUT NOVA NAB GS-22 2 2-0 T-19 (SUTURE) IMPLANT
SUT NOVA NAB GS-26 0 60 (SUTURE) ×6 IMPLANT
SUT PDS AB 0 CTX 60 (SUTURE) ×6 IMPLANT
SUT PROLENE 2 0 SH 30 (SUTURE) IMPLANT
SUT SILK 3 0 (SUTURE)
SUT SILK 3 0 SH CR/8 (SUTURE) ×6 IMPLANT
SUT SILK 3-0 18XBRD TIE 12 (SUTURE) IMPLANT
SUT VIC AB 2-0 CT1 27 (SUTURE) ×6
SUT VIC AB 2-0 CT1 TAPERPNT 27 (SUTURE) ×2 IMPLANT
SUT VIC AB 3-0 SH 27 (SUTURE)
SUT VIC AB 3-0 SH 27X BRD (SUTURE) ×1 IMPLANT
SUT VIC AB 4-0 PS2 27 (SUTURE) IMPLANT
SUT VICRYL AB 3 0 TIES (SUTURE) IMPLANT
SYR 20CC LL (SYRINGE) ×3 IMPLANT
TAPE CLOTH SURG 4X10 WHT LF (GAUZE/BANDAGES/DRESSINGS) ×3 IMPLANT
TAPE PAPER 2X10 WHT MICROPORE (GAUZE/BANDAGES/DRESSINGS) ×3 IMPLANT
TRAY FOLEY W/METER SILVER 16FR (SET/KITS/TRAYS/PACK) ×2 IMPLANT

## 2015-09-27 NOTE — Care Management Important Message (Signed)
Important Message  Patient Details  Name: [name] [name] MRN: [id number] Date of Birth: 1940-07-11   Medicare Important Message Given:  Yes    Sherald Barge, RN 09/27/2015, 2:53 PM

## 2015-09-27 NOTE — Op Note (Signed)
Patient:  [name] [name]  DOB:  1940-07-12  MRN:  [id number]   Preop Diagnosis:  Incarcerated right inguinal hernia  Postop Diagnosis:  Strangulated right inguinal hernia  Procedure:  Partial small bowel resection, repair of incarcerated right inguinal hernia  Surgeon:  Aviva Signs, M.D.  Anes:  Gen. endotracheal  Indications:  Patient is a 75 year old white male multiple medical problems including atrial fibrillation requiring anticoagulation, history of prostate carcinoma, and hypertension who presented to [facility] with nausea and vomiting. CT scan the abdomen revealed an incarcerated right all hernia. In talking with the patient, he has had this hernia present for over a year. His coagulopathy had to be corrected. He also needed a Cardizem drip to help control his atrial fibrillation. He now presents for right anal herniorrhaphy, possible small bowel resection. The risks and benefits of the procedure including bleeding, infection, cardiopulmonary difficulties, and the possibility of a bowel resection were fully explained to the patient, who gave informed consent.  Procedure note:  The patient was placed the supine position. After induction of general endotracheal anesthesia, the abdomen was prepped and draped using usual sterile technique with Betadine and DuraPrep. Surgical site confirmation was performed.  An incision was made in the right groin region down to the external oblique aponeuroses. There was a significant amount of subcutaneous edema. There was an indirect hernia sac that extended along side the spermatic cord. This extended down into the right testicle. The hernia sac was thickened and edematous. There was some necrosis present. Small bowel was incarcerated within it and there were some patches of gangrenous changes in the wall of small bowel. While mobilizing the small bowel, and enterotomy was noted. This bowel could not be fully reduced were explored through the  right groin incision. It was elected to proceed with a lower midline incision. The peritoneal cavity was entered into without difficulty through the midline incision. The bowel was incarcerated within the right inguinal hernia was delivered through the peritoneal cavity. Minimal spillage was noted. There was a significant amount of thickened small bowel and adhesions in this region. This appeared to be a condition that was chronic in nature. A GIA stapler was placed proximally and distally around the affected bowel loops. The mesentery was then divided using the LigaSure. The specimen was then removed from the operative field. A side to side enteroenterostomy was then performed using a GIA-75 stapler. The enterotomy was closed using a TA 60 stapler. The staple line was bolstered using 3-0 silk sutures. The mesenteric defect was closed using a 3-0 silk interrupted suture. The bowel was then returned into the abdominal cavity in order fashion. The right lower quadrant and lower abdomen were copiously irrigated with normal saline. All operating personnel then changed their gloves. The fascia was reapproximated using an 0 PDS looped suture. The subcutaneous layer was irrigated normal saline and the skin was closed loosely with staples. A dry dressing was then applied.  Next, all operating personnel changed gowns and gloves. A new setup was used. The right groin region was copiously irrigated with normal saline. As there was a significant amount of chronic thickening in the right inguinal region. It was elected to proceed with using an 8 cm Bard ventralax ST patch within the abdominal cavity. This looked secured to the internal oblique aponeuroses using 2-0 Novafil interrupted sutures. The internal oblique and external oblique aponeuroses were reapproximated using 2-0 Novafil interrupted sutures. The subcutaneous layer was closed in layers using a 2-0 Vicryl  interrupted suture. Exparel was instilled into the  surrounding wound. The skin was closed using staples. Betadine ointment and dry sterile dressings were applied to both incisions.  All tape and needle counts were correct at the end of the procedures. The patient was extubated in the operating room and transferred to PACU in stable condition.  Complications:  None  EBL:  100 mL  Specimen:  Small bowel

## 2015-09-27 NOTE — Progress Notes (Signed)
PROGRESS NOTE    [name] [name]  YTK:[id number] DOB: 1940-12-19 DOA: 09/18/2015 PCP: [name], MD   Brief Narrative:  Patient is a 75 year old man with a history of atrial fibrillation on Coumadin, CK D, HTN, cardiomyopathy, hypothyroidism, who presented to the ED on 09/18/2015 with persistent nausea and vomiting. He had been seen 3 days prior to admission in the ED for the same. At home, he had an apparent orthostatic syncopal episode that lasted a few seconds while he attempted to stand. In the ED, he was afebrile, mildly tachycardic, but with a low-normal blood pressure. His labs are noted for an INR of 4.88, lipase of 66, BUN of 95, creatinine of 6.53, normal LFTs, and a lactic acid of 1.8. He was admitted for further evaluation and management.   Assessment & Plan:   Principal Problem:   AKI (acute kidney injury) ([place]) Active Problems:   Orthostatic syncope   Vomiting   Constipation   SBO (small bowel obstruction) ([facility])   Incarcerated right inguinal hernia   Atrial fibrillation ([place])   Long term current use of anticoagulant therapy   Essential hypertension   CKD (chronic kidney disease) stage 3, GFR 30-59 ml/min   Dehydration   Cardiomyopathy ([place])   Hypokalemia   Warfarin-induced coagulopathy ([facility])   Hypothyroidism   Low-grade fever. Patient has developed a low-grade fever on 9/15. His white blood cell count has trended up to 12.7. He has some lower abdominal pain. His repeat urinalysis was unremarkable. His chest x-ray revealed no pneumonia. Blood cultures were ordered and are negative to date.  -In light of the small bowel obstruction and incarcerated inguinal hernia, Flagyl was started empirically on 9/16 and Cipro was started on 9/17. His white blood cell count has increased a little, but he is afebrile and appears more comfortable. MRSA screen was negative. -We'll continue current management  Incarcerated right inguinal hernia causing small bowel  obstruction. Patient's lipase and LFTs were within normal limits on admission. CT scan of the abdomen/pelvis was ordered for evaluation of persistent abdominal distention and vomiting. It revealed a high-grade bowel obstruction due to incarcerated right inguinal hernia. His lactic acid level was within normal limits. -NG tube was inserted and it drained a copious amount of green bilious fluid. IV Pepcid was started empirically. Zofran was changed to 4 mg every 6 hours scheduled>>> changed to when necessary on 09/25/15. -Due to low-grade fever and mild leukocytosis, Flagyl and Cipro were started. -Dr. Arnoldo Morale was consulted. He did not believe the patient had evidence of strangulation or gangrenous changes on exam. Dr. Arnoldo Morale recommended right inguinal herniorrhaphy. It is tentatively planned for today. -Patient now has active bowel sounds on exam.  Acute kidney injury superimposed on stage III chronic kidney disease. The patient's baseline creatinine per chart review range from 1.73-2.3. On admission, it was 6.53. Cozaar and Lasix were held on admission. He was started on vigorous IV fluids. His ins and outs were monitored. His urine output has been nonoliguric. His urinalysis was suggestive of a UTI. He was started on Rocephin. -His renal function/creatinine has improved and it is back at baseline. We'll continue IV fluids and holding ARB/diuretic. -Etiology is likely from prerenal azotemia/dehydration from poor oral intake and vomiting and/or ATN from the SBO.  Warfarin coagulopathy. Patient is treated with Coumadin chronically for A. fib. His INR was 4.88 on admission. It increased to a high of 7.36. He denied any evidence of GI or GU bleeding. -Due to the impending surgery,  he was given 5 mg of vitamin K 1 and 1 unit of fresh frozen plasma. -When his INR fell below 2, IV heparin was started per pharmacy.   Chronic atrial fibrillation>> RVR. Patient has a history of chronic atrial fibrillation,  treated with Coumadin and Coreg. His INR was supratherapeutic at 4.88 on admission. Coreg was held following NG tube insertion. He was started on metoprolol 5 mg IV every 4 hours. -The patient was transferred to the stepdown unit and started on a diltiazem drip and IV digoxin when his heart rate increased to the 150s to the 170s. -Continue current management as his heart rate has improved and is stable on the current regimen.  Reported history of cardiomyopathy. Per echo on 08/30/15, his EF was 50-55% with no regional wall motion abnormalities and normal diastolic function. His PA pressure was elevated at 41 mmHg. he has no signs of decompensated heart failure.  Hypertension. Patient is treated chronically with Cozaar and Coreg. Cozaar was held due to soft blood pressures. We'll continue to monitor. IV metoprolol was started to replace Coreg while the patient is nothing by mouth. -His blood pressure has been stable.  Orthostatic syncope. Neurologically, the patient is intact. Apparently, when he stood at home, he had a brief syncopal episode. -We'll continue IV fluid hydration.  Query UTI. Patient's urinalysis was consistent with infection. He was started on Rocephin. However, the urine culture revealed multiple species. Rocephin was discontinued after several days.  Hypokalemia. The patient's serum potassium was within normal limits on admission, but has drifted down to 2.9, possibly from vomiting. -Patient was given several runs of IV potassium and potassium was added to his maintenance IV fluids. His potassium has improved. We'll continue to monitor.  Hypothyroidism. The patient was continued on Synthroid. His TSH was within normal limits. -With his nothing by mouth status and NG tube, Synthroid was changed to IV.  Hyperglycemia This is secondary to the dextrose and IV fluids. His venous glucose has been consistently below 150. If it increases to above 150 consistently, will start  sliding scale NovoLog.  Mild  anemia. The patient's hemoglobin has drifted down some due to hemodilution and acute illness. INR reversed with FFP and vitamin K. He is on Pepcid for GI prophylaxis. -We'll continue to monitor.      DVT prophylaxis: Coumadin>>> IV heparin Code Status: Full code Family Communication: Discussed with sons Disposition Plan: Discharged home when clinically appropriate   Consultants:   Gen. surgery  Procedures:   None  Antimicrobials:  Cipro 9/17>>  Flagyl 09/25/15>>  Rocephin >> 09/25/15   Subjective: Patient denies bowel movement or passing gas, but he hears his abdomen "growling".  Objective: Vitals:   09/27/15 0400 09/27/15 0500 09/27/15 0600 09/27/15 0805  BP:   113/68   Pulse: 96  82   Resp: 20  (!) 23   Temp: 97.2 F (36.2 C)   98.3 F (36.8 C)  TempSrc: Oral   Axillary  SpO2: 99%  98%   Weight:  111.6 kg (246 lb 0.5 oz)    Height:        Intake/Output Summary (Last 24 hours) at 09/27/15 0821 Last data filed at 09/27/15 0500  Gross per 24 hour  Intake          2620.34 ml  Output             2225 ml  Net           395.34 ml   [employer]  09/25/15 0400 09/26/15 0500 09/27/15 0500  Weight: 114.5 kg (252 lb 6.8 oz) 111.9 kg (246 lb 11.1 oz) 111.6 kg (246 lb 0.5 oz)    Examination:  General exam: Appears calm and comfortable  HEENT: NG tube draining small amount of green bilious fluid. Respiratory system: Clear anteriorly.  Respiratory effort normal. Cardiovascular system: Irregular, irregular, with soft systolic murmur. No pedal edema. Gastrointestinal system: Abdomen is obese, positive bowel sounds; no significant distention; mildly tender over the right inguinal area. NG-draining greenish bilious fluid seen in the canister Central nervous system: Alert and oriented. No focal neurological deficits. Extremities:  No acute hot red joints. Skin: Fair turgor. Psychiatry: Judgement and insight appear normal. Mood &  affect appropriate.     Data Reviewed: I have personally reviewed following labs and imaging studies  CBC:  Recent Labs Lab 09/22/15 0553 09/23/15 0651 09/25/15 0519 09/26/15 0400 09/27/15 0435  WBC 9.4 11.1* 12.7* 12.0* 13.0*  NEUTROABS  --   --   --   --  9.3*  HGB 13.5 12.7* 11.1* 10.5* 10.8*  HCT 41.3 38.7* 34.5* 33.4* 34.2*  MCV 97.9 97.2 100.0 102.1* 101.8*  PLT 293 257 270 313 947   Basic Metabolic Panel:  Recent Labs Lab 09/23/15 0651 09/24/15 0548 09/25/15 0519 09/26/15 0400 09/27/15 0435  NA 138 140 141 140 142  K 4.1 4.1 4.8 4.3 4.5  CL 99* 102 108 112* 112*  CO2 30 29 28 25 25   GLUCOSE 115* 147* 140* 126* 111*  BUN 75* 52* 31* 20 15  CREATININE 3.16* 2.67* 2.31* 1.89* 1.75*  CALCIUM 7.3* 7.2* 7.4* 7.4* 7.8*  MG  --  2.0  --   --   --    GFR: Estimated Creatinine Clearance: 48.5 mL/min (by C-G formula based on SCr of 1.75 mg/dL (H)). Liver Function Tests:  Recent Labs Lab 09/23/15 0651  AST 17  ALT 17  ALKPHOS 44  BILITOT 0.8  PROT 6.0*  ALBUMIN 2.6*   No results for input(s): LIPASE, AMYLASE in the last 168 hours. No results for input(s): AMMONIA in the last 168 hours. Coagulation Profile:  Recent Labs Lab 09/23/15 0651 09/24/15 0548 09/25/15 0519 09/26/15 0400 09/27/15 0435  INR 7.36* 1.74 1.43 1.34 1.31   Cardiac Enzymes: No results for input(s): CKTOTAL, CKMB, CKMBINDEX, TROPONINI in the last 168 hours. BNP (last 3 results) No results for input(s): PROBNP in the last 8760 hours. HbA1C: No results for input(s): HGBA1C in the last 72 hours. CBG: No results for input(s): GLUCAP in the last 168 hours. Lipid Profile: No results for input(s): CHOL, HDL, LDLCALC, TRIG, CHOLHDL, LDLDIRECT in the last 72 hours. Thyroid Function Tests: No results for input(s): TSH, T4TOTAL, FREET4, T3FREE, THYROIDAB in the last 72 hours. Anemia Panel: No results for input(s): VITAMINB12, FOLATE, FERRITIN, TIBC, IRON, RETICCTPCT in the last 72  hours. Sepsis Labs:  Recent Labs Lab 09/22/15 1657  LATICACIDVEN 1.2    Recent Results (from the past 240 hour(s))  Urine culture     Status: Abnormal   Collection Time: 09/18/15  2:25 AM  Result Value Ref Range Status   Specimen Description URINE, CLEAN CATCH  Final   Special Requests NONE  Final   Culture MULTIPLE SPECIES PRESENT, SUGGEST RECOLLECTION (A)  Final   Report Status 09/20/2015 FINAL  Final  Urine culture     Status: None   Collection Time: 09/21/15 10:51 AM  Result Value Ref Range Status   Specimen Description URINE, CLEAN CATCH  Final   Special Requests NONE  Final   Culture NO GROWTH Performed at [facility]   Final   Report Status 09/23/2015 FINAL  Final  MRSA PCR Screening     Status: None   Collection Time: 09/24/15  2:45 PM  Result Value Ref Range Status   MRSA by PCR NEGATIVE NEGATIVE Final    Comment:        The GeneXpert MRSA Assay (FDA approved for NASAL specimens only), is one component of a comprehensive MRSA colonization surveillance program. It is not intended to diagnose MRSA infection nor to guide or monitor treatment for MRSA infections.   Culture, blood (Routine X 2) w Reflex to ID Panel     Status: None (Preliminary result)   Collection Time: 09/24/15  5:37 PM  Result Value Ref Range Status   Specimen Description RIGHT ANTECUBITAL  Final   Special Requests BOTTLES DRAWN AEROBIC AND ANAEROBIC [facility] EACH  Final   Culture PENDING  Incomplete   Report Status PENDING  Incomplete  Culture, blood (Routine X 2) w Reflex to ID Panel     Status: None (Preliminary result)   Collection Time: 09/24/15  7:05 PM  Result Value Ref Range Status   Specimen Description BLOOD RIGHT HAND  Final   Special Requests BOTTLES DRAWN AEROBIC AND ANAEROBIC [facility] EACH  Final   Culture PENDING  Incomplete   Report Status PENDING  Incomplete         Radiology Studies: No results found.      Scheduled Meds: . ciprofloxacin  400 mg Intravenous  Q12H  . digoxin  0.125 mg Intravenous Daily  . famotidine (PEPCID) IV  20 mg Intravenous Q24H  . levothyroxine  25 mcg Intravenous Daily  . metoprolol  5 mg Intravenous Q8H  . metronidazole  500 mg Intravenous Q8H  . polyethylene glycol  17 g Oral Daily   Continuous Infusions: . dextrose 5 % and 0.9% NaCl 125 mL/hr at 09/26/15 1736  . diltiazem (CARDIZEM) infusion 5 mg/hr (09/26/15 1736)     LOS: 9 days    Time spent: 8 minutes    Rexene Alberts, MD [facility] Pager [phone]  If 7PM-7AM, please contact night-coverage [email or web address] Password [email or web address] 09/27/2015, 8:21 AM

## 2015-09-27 NOTE — Anesthesia Postprocedure Evaluation (Signed)
Anesthesia Post Note  Patient: [name] [name]  Procedure(s) Performed: Procedure(s) (LRB): RIGHT INGUINAL HERNIORRHAPY WITH MESH SMALL BOWEL RESECTION (Right)  Patient location during evaluation: PACU Anesthesia Type: General Level of consciousness: sedated, patient cooperative and responds to stimulation Pain management: pain level controlled Vital Signs Assessment: post-procedure vital signs reviewed and stable Respiratory status: spontaneous breathing, respiratory function stable and non-rebreather facemask Cardiovascular status: stable Anesthetic complications: no    Last Vitals:  Vitals:   09/27/15 1025 09/27/15 1030  BP: (!) 99/55 (!) 93/51  Pulse:    Resp: 20 (!) 23  Temp:      Last Pain:  Vitals:   09/27/15 0925  TempSrc: Oral  PainSc:                  Katlyn Muldrew

## 2015-09-27 NOTE — Anesthesia Preprocedure Evaluation (Addendum)
Anesthesia Evaluation  Patient identified by MRN, date of birth, ID band Patient awake    Reviewed: Allergy & Precautions, H&P , NPO status , Patient's Chart, lab work & pertinent test results, reviewed documented beta blocker date and time   Airway Mallampati: III       Dental  (+) Edentulous Upper, Edentulous Lower   Pulmonary shortness of breath, pneumonia,    breath sounds clear to auscultation       Cardiovascular hypertension, + CAD  + dysrhythmias Atrial Fibrillation  Rhythm:Irregular Rate:Normal     Neuro/Psych    GI/Hepatic   Endo/Other  Hypothyroidism   Renal/GU Renal disease     Musculoskeletal   Abdominal   Peds  Hematology   Anesthesia Other Findings   Reproductive/Obstetrics                             Anesthesia Physical Anesthesia Plan  ASA: III  Anesthesia Plan: General   Post-op Pain Management:    Induction: Intravenous, Rapid sequence and Cricoid pressure planned  Airway Management Planned: Oral ETT  Additional Equipment:   Intra-op Plan:   Post-operative Plan: Extubation in OR  Informed Consent: I have reviewed the patients History and Physical, chart, labs and discussed the procedure including the risks, benefits and alternatives for the proposed anesthesia with the patient or authorized representative who has indicated his/her understanding and acceptance.     Plan Discussed with:   Anesthesia Plan Comments: (amidate induction )        Anesthesia Quick Evaluation

## 2015-09-27 NOTE — Progress Notes (Signed)
Pharmacy Antibiotic Note  [name] [name] is a 75 y.o. male admitted on 09/18/2015 with intra-abdominal infection.  He is s/p partial bowel resection and repair inguinal hernia Pharmacy has been consulted for vancomycin dosing.  Plan: Vancomycin 2 gm IV X 1 then 1250 mg IV q24 hours F/u renal function, cultures and clinical course  Height: 6\' 2"  (188 cm) Weight: 246 lb (111.6 kg) IBW/kg (Calculated) : 82.2  Temp (24hrs), Avg:97.9 F (36.6 C), Min:97.2 F (36.2 C), Max:98.4 F (36.9 C)   Recent Labs Lab 09/22/15 0553 09/22/15 1657 09/23/15 0651 09/24/15 0548 09/25/15 0519 09/26/15 0400 09/27/15 0435  WBC 9.4  --  11.1*  --  12.7* 12.0* 13.0*  CREATININE 3.62*  --  3.16* 2.67* 2.31* 1.89* 1.75*  LATICACIDVEN  --  1.2  --   --   --   --   --     Estimated Creatinine Clearance: 48.5 mL/min (by C-G formula based on SCr of 1.75 mg/dL (H)).    No Known Allergies  Antimicrobials this admission: 9/16 flagyl >> 9/18 cipro 9/17 >> 9/18 vanc  9/18>> Zosyn  9/18>>   Thank you for allowing pharmacy to be a part of this patient's care.  Beverlee Nims 09/27/2015 2:37 PM

## 2015-09-27 NOTE — Anesthesia Procedure Notes (Addendum)
Procedure Name: Intubation Date/Time: 09/27/2015 10:44 AM Performed by: Gershon Mussel, Ariany Kesselman Pre-anesthesia Checklist: Patient identified, Patient being monitored, Timeout performed, Emergency Drugs available and Suction available Patient Re-evaluated:Patient Re-evaluated prior to inductionOxygen Delivery Method: Circle System Utilized Preoxygenation: Pre-oxygenation with 100% oxygen Intubation Type: IV induction, Rapid sequence and Cricoid Pressure applied Ventilation: Mask ventilation without difficulty Laryngoscope Size: Miller and 2 Grade View: Grade I Tube type: Oral Tube size: 7.0 mm Number of attempts: 1 Airway Equipment and Method: Stylet Placement Confirmation: ETT inserted through vocal cords under direct vision,  positive ETCO2 and breath sounds checked- equal and bilateral Secured at: 21 cm Tube secured with: Tape Dental Injury: Teeth and Oropharynx as per pre-operative assessment

## 2015-09-27 NOTE — Transfer of Care (Signed)
Immediate Anesthesia Transfer of Care Note  Patient: [name] [name]  Procedure(s) Performed: Procedure(s): RIGHT INGUINAL HERNIORRHAPY WITH MESH SMALL BOWEL RESECTION (Right)  Patient Location: PACU  Anesthesia Type:General  Level of Consciousness: sedated, patient cooperative and responds to stimulation  Airway & Oxygen Therapy: Patient Spontanous Breathing and Patient connected to face mask oxygen  Post-op Assessment: Report given to RN, Post -op Vital signs reviewed and stable and Patient moving all extremities  Post vital signs: Reviewed and stable  Last Vitals:  Vitals:   09/27/15 1025 09/27/15 1030  BP: (!) 99/55 (!) 93/51  Pulse:    Resp: 20 (!) 23  Temp:      Last Pain:  Vitals:   09/27/15 0925  TempSrc: Oral  PainSc:       Patients Stated Pain Goal: 2 (21/82/88 3374)  Complications: No apparent anesthesia complications

## 2015-09-28 DIAGNOSIS — I482 Chronic atrial fibrillation: Secondary | ICD-10-CM | POA: Diagnosis not present

## 2015-09-28 DIAGNOSIS — K4030 Unilateral inguinal hernia, with obstruction, without gangrene, not specified as recurrent: Secondary | ICD-10-CM | POA: Diagnosis not present

## 2015-09-28 DIAGNOSIS — K5669 Other intestinal obstruction: Secondary | ICD-10-CM | POA: Diagnosis not present

## 2015-09-28 DIAGNOSIS — I10 Essential (primary) hypertension: Secondary | ICD-10-CM | POA: Diagnosis not present

## 2015-09-28 DIAGNOSIS — K5909 Other constipation: Secondary | ICD-10-CM | POA: Diagnosis not present

## 2015-09-28 DIAGNOSIS — E876 Hypokalemia: Secondary | ICD-10-CM | POA: Diagnosis not present

## 2015-09-28 DIAGNOSIS — N183 Chronic kidney disease, stage 3 (moderate): Secondary | ICD-10-CM | POA: Diagnosis not present

## 2015-09-28 DIAGNOSIS — N179 Acute kidney failure, unspecified: Secondary | ICD-10-CM | POA: Diagnosis not present

## 2015-09-28 LAB — CBC
HEMATOCRIT: 35.6 % — AB (ref 39.0–52.0)
HEMOGLOBIN: 11.2 g/dL — AB (ref 13.0–17.0)
MCH: 31.8 pg (ref 26.0–34.0)
MCHC: 31.5 g/dL (ref 30.0–36.0)
MCV: 101.1 fL — ABNORMAL HIGH (ref 78.0–100.0)
Platelets: 345 10*3/uL (ref 150–400)
RBC: 3.52 MIL/uL — AB (ref 4.22–5.81)
RDW: 13.3 % (ref 11.5–15.5)
WBC: 20.3 10*3/uL — ABNORMAL HIGH (ref 4.0–10.5)

## 2015-09-28 LAB — BASIC METABOLIC PANEL
ANION GAP: 4 — AB (ref 5–15)
BUN: 22 mg/dL — ABNORMAL HIGH (ref 6–20)
CALCIUM: 7.4 mg/dL — AB (ref 8.9–10.3)
CHLORIDE: 111 mmol/L (ref 101–111)
CO2: 24 mmol/L (ref 22–32)
Creatinine, Ser: 2.71 mg/dL — ABNORMAL HIGH (ref 0.61–1.24)
GFR calc non Af Amer: 21 mL/min — ABNORMAL LOW (ref >60)
GFR, EST AFRICAN AMERICAN: 25 mL/min — AB (ref >60)
GLUCOSE: 104 mg/dL — AB (ref 65–99)
POTASSIUM: 5.5 mmol/L — AB (ref 3.5–5.1)
Sodium: 139 mmol/L (ref 135–145)

## 2015-09-28 LAB — MAGNESIUM: MAGNESIUM: 1.3 mg/dL — AB (ref 1.7–2.4)

## 2015-09-28 LAB — GLUCOSE, CAPILLARY: GLUCOSE-CAPILLARY: 113 mg/dL — AB (ref 65–99)

## 2015-09-28 LAB — PROTIME-INR
INR: 1.37
PROTHROMBIN TIME: 17 s — AB (ref 11.4–15.2)

## 2015-09-28 LAB — PHOSPHORUS: Phosphorus: 4.2 mg/dL (ref 2.5–4.6)

## 2015-09-28 MED ORDER — SODIUM CHLORIDE 0.9 % IV BOLUS (SEPSIS)
500.0000 mL | Freq: Once | INTRAVENOUS | Status: AC
  Administered 2015-09-28: 500 mL via INTRAVENOUS

## 2015-09-28 MED ORDER — SODIUM BICARBONATE 8.4 % IV SOLN
INTRAVENOUS | Status: DC
  Administered 2015-09-28 – 2015-09-30 (×7): via INTRAVENOUS
  Filled 2015-09-28 (×11): qty 1000

## 2015-09-28 NOTE — Care Management Note (Signed)
Case Management Note  Patient Details  Name: [name] [name] MRN: [id number] Date of Birth: [birth year]  If discussed at [place] Length of Stay Meetings, dates discussed:  09/28/2015   Sherald Barge, RN 09/28/2015, 1:42 PM

## 2015-09-28 NOTE — Addendum Note (Signed)
Addendum  created 09/28/15 1211 by Mickel Baas, CRNA   Sign clinical note

## 2015-09-28 NOTE — Anesthesia Postprocedure Evaluation (Signed)
Anesthesia Post Note  Patient: [name] [name]  Procedure(s) Performed: Procedure(s) (LRB): RIGHT INGUINAL HERNIORRHAPY WITH MESH SMALL BOWEL RESECTION (Right)  Patient location during evaluation: ICU Anesthesia Type: General Level of consciousness: awake and oriented Pain management: pain level controlled Vital Signs Assessment: post-procedure vital signs reviewed and stable Respiratory status: spontaneous breathing Cardiovascular status: stable Postop Assessment: no signs of nausea or vomiting Anesthetic complications: no    Last Vitals:  Vitals:   09/28/15 1000 09/28/15 1143  BP: (!) 102/57   Pulse: (!) 40   Resp: 19   Temp:  36.2 C    Last Pain:  Vitals:   09/28/15 1143  TempSrc: Axillary  PainSc:                  ADAMS, AMY A

## 2015-09-28 NOTE — Progress Notes (Signed)
DR Arnoldo Morale CALLED AND NOTIFIED OF PT'S TEMP OF 102 AT 1945  . THAT TYLENOL 650MG  GIVEN PR AND NOW TEMP IS DOWN TO 100.5 NO ADDITIONAL ORDERS RECEIVED.

## 2015-09-28 NOTE — Progress Notes (Addendum)
PROGRESS NOTE    [name] [name]  VKF:[id number] DOB: 05-24-1940 DOA: 09/18/2015 PCP: [name], MD   Brief Narrative:  Patient is a 75 year old man with a history of atrial fibrillation on Coumadin, CK D, HTN, cardiomyopathy, hypothyroidism, who presented to the ED on 09/18/2015 with persistent nausea and vomiting. He had been seen 3 days prior to admission in the ED for the same. At home, he had an apparent orthostatic syncopal episode that lasted a few seconds while he attempted to stand. In the ED, he was afebrile, mildly tachycardic, but with a low-normal blood pressure. His labs are noted for an INR of 4.88, lipase of 66, BUN of 95, creatinine of 6.53, normal LFTs, and a lactic acid of 1.8. He was admitted for N/V, AKI, and orthostatic syncope. Patient found to have incarcerated right inguinal hernia and SBO. He was started on Cipro/Flagyl for low-grade fever. He was taken to the OR on 09/27/15 by Dr. Arnoldo Morale. Patient was found to have a strangulated right inguinal hernia; status post partial small bowel resection and repair of the right inguinal hernia. Will continue postoperative management.   Assessment & Plan:   Principal Problem:   AKI (acute kidney injury) ([place]) Active Problems:   Orthostatic syncope   Vomiting   Constipation   SBO (small bowel obstruction) ([facility])   Incarcerated right inguinal hernia   Atrial fibrillation ([place])   Long term current use of anticoagulant therapy   Essential hypertension   CKD (chronic kidney disease) stage 3, GFR 30-59 ml/min   Dehydration   Cardiomyopathy ([place])   Hypokalemia   Warfarin-induced coagulopathy ([facility])   Hypothyroidism   Low-grade fever. Patient has developed a low-grade fever on 9/15. His WBC started trending up. His repeat UA was unremarkable. His chest x-ray revealed no pneumonia. Blood cultures were ordered and are negative to date. His MRSA screen was negative. -Cipro and Flagyl were started empirically.  Postoperatively, his WBC continues to trend up and he developed a postop fever 1. Dr. Arnoldo Morale started vancomycin and Zosyn.  Incarcerated>>> strangulated right inguinal hernia right inguinal hernia causing small bowel obstruction. Patient's lipase and LFTs were within normal limits on admission. CT scan of the abdomen/pelvis was ordered for evaluation of persistent abdominal distention and vomiting. It revealed a high-grade bowel obstruction due to incarcerated right inguinal hernia. His lactic acid level was within normal limits. -NG tube was inserted and it drained a copious amount of green bilious fluid. IV Pepcid was started empirically. -Flagyl and Cipro were started for a low-grade fever and for his WBC trending up.. -Dr. Arnoldo Morale was consulted and performed partial SBO resection and repair of incarcerated right inguinal hernia on 9/18. -Patient is postop day #1. -Continue vancomycin and Zosyn as started. Will order a set of blood cultures if the patient spikes again.  Hypotension. Patient's blood pressure has fallen to the 80s, likely from some volume depletion from the recent operation and/or diltiazem drip and IV metoprolol. -We'll give a bolus of normal saline and increase maintenance IV fluids. Nursing was instructed to titrate down diltiazem and hold metoprolol for SBP of less than 95.  Hypokalemia >>>Hyperkalemia. Patient's serum potassium had decreased to 2.9 following admission. He was given several runs of IV potassium and started on potassium and IV fluids with improvement in his potassium. -Postop, his serum potassium has increased to 5.5. -We'll avoid Kayexalate enema due to recent operation. Will avoid Lasix due to low blood pressure. Will treat with gentle bicarbonate infusion with D5  half-normal saline with an amp of bicarbonate. We'll continue to monitor.  AkI superimposed on stage III chronic kidney disease, secondary to prerenal azotemia/ATN from SBO. The patient's  baseline creatinine per chart review range from 1.73-2.3. On admission, it was 6.53. Cozaar and Lasix were held on admission. He was started on vigorous IV fluids. His urine output has been nonoliguric. His urinalysis was suggestive of a UTI. He was started on Rocephin. -His renal function/creatinine  improved and it is back at baseline at 1.75.Marland Kitchen Postop, it has increased to 2.71, possibly from hypoperfusion. Will increase the IV fluid rate and monitor.  Warfarin coagulopathy. Patient is treated with Coumadin chronically for A. fib. His INR was 4.88 on admission. It increased to a high of 7.36. He denied any evidence of GI or GU bleeding. -Due to the impending surgery, he was given 5 mg of vitamin K 1 and 1 unit of fresh frozen plasma. -When his INR fell below 2, IV heparin was started per pharmacy. Postop, IV heparin is being held until Dr. Arnoldo Morale recommends restarting it, possibly tomorrow.  Chronic atrial fibrillation>> RVR. Patient has a history of chronic atrial fibrillation, treated with Coumadin and Coreg. His INR was supratherapeutic at 4.88 on admission. Coreg was held following NG tube insertion. He was started on metoprolol 5 mg IV every 4 hours. -The patient was transferred to the stepdown unit and started on a diltiazem drip and IV digoxin when his heart rate increased to the 150s to the 170s. -His heart rate has been controlled over the past few days. Will adjust diltiazem rate and metoprolol as his blood pressure tolerates it. Continue IV digoxin.  Reported history of cardiomyopathy. Per echo on 08/30/15, his EF was 50-55% with no regional wall motion abnormalities and normal diastolic function. His PA pressure was elevated at 41 mmHg. he has no signs of decompensated heart failure.  Hypertension>> postop hypotension. Patient is treated chronically with Cozaar and Coreg. Cozaar and Coreg were held due to soft blood pressures and npo status. IV metoprolol was started in place of Coreg.   -She has had postop hypotension. Metoprolol will be held for SBP of less than 95.  Orthostatic syncope. Neurologically, the patient is intact. Apparently, when he stood at home, he had a brief syncopal episode which was likely from orthostatic hypotension. He has remained neurologically intact.  Query UTI. Patient's urinalysis was consistent with infection. He was started on Rocephin. However, the urine culture revealed multiple species. Rocephin was discontinued after several days.  Hypothyroidism. The patient was continued on Synthroid. His TSH was within normal limits. -With his npo status and NG tube, Synthroid was changed to IV.  Hyperglycemia This is secondary to the dextrose and IV fluids. His venous glucose has been consistently below 150. If it increases to above 150 consistently, will need to start sliding scale NovoLog.  Mild  anemia. The patient's hemoglobin has drifted down some due to hemodilution and acute illness. INR reversed with FFP and vitamin K. He is on Pepcid for GI prophylaxis. -We'll continue to monitor.      DVT prophylaxis: Coumadin>>> IV heparin-restart per Dr. Arnoldo Morale. Code Status: Full code Family Communication: Discussed with sons on 09/27/15 Disposition Plan: Discharged home when clinically appropriate   Consultants:   Gen. surgery  Procedures:   09/27/2015: Partial small bowel resection and repair of incarcerated right inguinal hernia, per Dr. Arnoldo Morale.  Antimicrobials:  Vancomycin 9/18>>  Zosyn 9/18>>  Cipro 9/17>>9/18  Flagyl 09/25/15>>9/18  Rocephin >> 09/25/15  Subjective: Patient denies abdominal pain, but was given morphine several hours ago for pain. He denies shortness of breath.  Objective: Vitals:   09/28/15 0630 09/28/15 0645 09/28/15 0700 09/28/15 0750  BP: 100/63 (!) 82/55 (!) 87/58   Pulse: 100 (!) 110 90   Resp: 18 17 15    Temp:    97.4 F (36.3 C)  TempSrc:    Axillary  SpO2: 96% 92% 96%   Weight:        Height:        Intake/Output Summary (Last 24 hours) at 09/28/15 0754 Last data filed at 09/28/15 0645  Gross per 24 hour  Intake          6263.08 ml  Output              300 ml  Net          5963.08 ml   Filed Weights   09/27/15 0500 09/27/15 0925 09/28/15 0500  Weight: 111.6 kg (246 lb 0.5 oz) 111.6 kg (246 lb) 113.1 kg (249 lb 5.4 oz)    Examination:  General exam: Appears calm and comfortable  HEENT: NG tube draining small amount of green bilious fluid. Respiratory system: Clear anteriorly.  Respiratory effort normal. Cardiovascular system: Irregular, irregular, with soft systolic murmur. No pedal edema. Gastrointestinal system: Abdomen is obese, positive bowel sounds; no significant distention; mildly tender over the right inguinal area. NG-draining greenish bilious fluid seen in the canister Central nervous system: Alert and oriented. No focal neurological deficits. Extremities:  No acute hot red joints. Skin: Fair turgor. Psychiatry: Judgement and insight appear normal. Mood & affect appropriate.     Data Reviewed: I have personally reviewed following labs and imaging studies  CBC:  Recent Labs Lab 09/23/15 0651 09/25/15 0519 09/26/15 0400 09/27/15 0435 09/28/15 0427  WBC 11.1* 12.7* 12.0* 13.0* 20.3*  NEUTROABS  --   --   --  9.3*  --   HGB 12.7* 11.1* 10.5* 10.8* 11.2*  HCT 38.7* 34.5* 33.4* 34.2* 35.6*  MCV 97.2 100.0 102.1* 101.8* 101.1*  PLT 257 270 313 337 973   Basic Metabolic Panel:  Recent Labs Lab 09/24/15 0548 09/25/15 0519 09/26/15 0400 09/27/15 0435 09/28/15 0427  NA 140 141 140 142 139  K 4.1 4.8 4.3 4.5 5.5*  CL 102 108 112* 112* 111  CO2 29 28 25 25 24   GLUCOSE 147* 140* 126* 111* 104*  BUN 52* 31* 20 15 22*  CREATININE 2.67* 2.31* 1.89* 1.75* 2.71*  CALCIUM 7.2* 7.4* 7.4* 7.8* 7.4*  MG 2.0  --   --   --  1.3*  PHOS  --   --   --   --  4.2   GFR: Estimated Creatinine Clearance: 31.5 mL/min (by C-G formula based on SCr of  2.71 mg/dL (H)). Liver Function Tests:  Recent Labs Lab 09/23/15 0651  AST 17  ALT 17  ALKPHOS 44  BILITOT 0.8  PROT 6.0*  ALBUMIN 2.6*   No results for input(s): LIPASE, AMYLASE in the last 168 hours. No results for input(s): AMMONIA in the last 168 hours. Coagulation Profile:  Recent Labs Lab 09/24/15 0548 09/25/15 0519 09/26/15 0400 09/27/15 0435 09/28/15 0427  INR 1.74 1.43 1.34 1.31 1.37   Cardiac Enzymes: No results for input(s): CKTOTAL, CKMB, CKMBINDEX, TROPONINI in the last 168 hours. BNP (last 3 results) No results for input(s): PROBNP in the last 8760 hours. HbA1C: No results for input(s): HGBA1C in the last 72 hours. CBG: No  results for input(s): GLUCAP in the last 168 hours. Lipid Profile: No results for input(s): CHOL, HDL, LDLCALC, TRIG, CHOLHDL, LDLDIRECT in the last 72 hours. Thyroid Function Tests: No results for input(s): TSH, T4TOTAL, FREET4, T3FREE, THYROIDAB in the last 72 hours. Anemia Panel: No results for input(s): VITAMINB12, FOLATE, FERRITIN, TIBC, IRON, RETICCTPCT in the last 72 hours. Sepsis Labs:  Recent Labs Lab 09/22/15 1657  LATICACIDVEN 1.2    Recent Results (from the past 240 hour(s))  Urine culture     Status: None   Collection Time: 09/21/15 10:51 AM  Result Value Ref Range Status   Specimen Description URINE, CLEAN CATCH  Final   Special Requests NONE  Final   Culture NO GROWTH Performed at [facility]   Final   Report Status 09/23/2015 FINAL  Final  MRSA PCR Screening     Status: None   Collection Time: 09/24/15  2:45 PM  Result Value Ref Range Status   MRSA by PCR NEGATIVE NEGATIVE Final    Comment:        The GeneXpert MRSA Assay (FDA approved for NASAL specimens only), is one component of a comprehensive MRSA colonization surveillance program. It is not intended to diagnose MRSA infection nor to guide or monitor treatment for MRSA infections.   Culture, blood (Routine X 2) w Reflex to ID Panel      Status: None (Preliminary result)   Collection Time: 09/24/15  5:37 PM  Result Value Ref Range Status   Specimen Description RIGHT ANTECUBITAL  Final   Special Requests BOTTLES DRAWN AEROBIC AND ANAEROBIC 6CC EACH  Final   Culture NO GROWTH 3 DAYS  Final   Report Status PENDING  Incomplete  Culture, blood (Routine X 2) w Reflex to ID Panel     Status: None (Preliminary result)   Collection Time: 09/24/15  7:05 PM  Result Value Ref Range Status   Specimen Description BLOOD RIGHT HAND  Final   Special Requests BOTTLES DRAWN AEROBIC AND ANAEROBIC 8CC EACH  Final   Culture NO GROWTH 3 DAYS  Final   Report Status PENDING  Incomplete         Radiology Studies: No results found.      Scheduled Meds: . chlorhexidine  15 mL Mouth Rinse BID  . digoxin  0.125 mg Intravenous Daily  . famotidine (PEPCID) IV  20 mg Intravenous Q24H  . levothyroxine  25 mcg Intravenous Daily  . mouth rinse  15 mL Mouth Rinse q12n4p  . metoprolol  5 mg Intravenous Q8H  . piperacillin-tazobactam (ZOSYN)  IV  3.375 g Intravenous Q8H  . vancomycin  1,250 mg Intravenous Q24H   Continuous Infusions: . diltiazem (CARDIZEM) infusion 5 mg/hr (09/28/15 0600)  . lactated ringers 100 mL/hr at 09/28/15 0600     LOS: 10 days    Time spent: 30 minutes    Rexene Alberts, MD [facility] Pager [phone]  If 7PM-7AM, please contact night-coverage [email or web address] Password [facility] 09/28/2015, 7:54 AM

## 2015-09-28 NOTE — Progress Notes (Signed)
NO FURTHER TEMP TEMP ELEVATIONS THIS 12HRS.

## 2015-09-28 NOTE — Progress Notes (Signed)
1 Day Post-Op  Subjective: Patient resting comfortably. He just received pain medications.  Objective: Vital signs in last 24 hours: Temp:  [97.2 F (36.2 C)-102.2 F (39 C)] 97.4 F (36.3 C) (09/19 0750) Pulse Rate:  [42-152] 99 (09/19 0800) Resp:  [12-34] 14 (09/19 0800) BP: (61-141)/(42-104) 81/52 (09/19 0800) SpO2:  [86 %-100 %] 97 % (09/19 0800) Weight:  [111.6 kg (246 lb)-113.1 kg (249 lb 5.4 oz)] 113.1 kg (249 lb 5.4 oz) (09/19 0500) Last BM Date: 09/12/15  Intake/Output from previous day: 09/18 0701 - 09/19 0700 In: 6263.1 [I.V.:5413.1; IV Piggyback:850] Out: 300 [Urine:100; Emesis/NG output:100; Blood:100] Intake/Output this shift: No intake/output data recorded.  General appearance: no distress Resp: clear to auscultation bilaterally Cardio: irregularly irregular rhythm GI: Soft, incisions healing well.  Lab Results:   Recent Labs  09/27/15 0435 09/28/15 0427  WBC 13.0* 20.3*  HGB 10.8* 11.2*  HCT 34.2* 35.6*  PLT 337 345   BMET  Recent Labs  09/27/15 0435 09/28/15 0427  NA 142 139  K 4.5 5.5*  CL 112* 111  CO2 25 24  GLUCOSE 111* 104*  BUN 15 22*  CREATININE 1.75* 2.71*  CALCIUM 7.8* 7.4*   PT/INR  Recent Labs  09/27/15 0435 09/28/15 0427  LABPROT 16.4* 17.0*  INR 1.31 1.37    Studies/Results: No results found.  Anti-infectives: Anti-infectives    Start     Dose/Rate Route Frequency Ordered Stop   09/28/15 1500  vancomycin (VANCOCIN) 1,250 mg in sodium chloride 0.9 % 250 mL IVPB     1,250 mg 166.7 mL/hr over 90 Minutes Intravenous Every 24 hours 09/27/15 1437     09/27/15 1445  piperacillin-tazobactam (ZOSYN) IVPB 3.375 g     3.375 g 12.5 mL/hr over 240 Minutes Intravenous Every 8 hours 09/27/15 1443     09/27/15 1445  vancomycin (VANCOCIN) 2,000 mg in sodium chloride 0.9 % 500 mL IVPB     2,000 mg 250 mL/hr over 120 Minutes Intravenous  Once 09/27/15 1434 09/27/15 1713   09/26/15 0915  ciprofloxacin (CIPRO) IVPB 400 mg   Status:  Discontinued     400 mg 200 mL/hr over 60 Minutes Intravenous Every 12 hours 09/26/15 0914 09/27/15 1426   09/25/15 1030  metroNIDAZOLE (FLAGYL) IVPB 500 mg  Status:  Discontinued     500 mg 100 mL/hr over 60 Minutes Intravenous Every 8 hours 09/25/15 0934 09/27/15 1426   09/19/15 0400  cefTRIAXone (ROCEPHIN) 1 g in dextrose 5 % 50 mL IVPB     1 g 100 mL/hr over 30 Minutes Intravenous Every 24 hours 09/18/15 0522 09/23/15 0441   09/18/15 0415  cefTRIAXone (ROCEPHIN) 1 g in dextrose 5 % 50 mL IVPB     1 g 100 mL/hr over 30 Minutes Intravenous  Once 09/18/15 0406 09/18/15 0457      Assessment/Plan: s/p Procedure(s): RIGHT INGUINAL HERNIORRHAPY WITH MESH SMALL BOWEL RESECTION Impression: Day 1, status post right anal herniorrhaphy with mesh and partial small bowel resection. I am per Lissa Hoard start him on vancomycin and Zosyn due to the gangrenous changes within the small bowel as well as the use of mesh to repair his hernia. I would leave the NG tube in for another 24 hours. If possible, would like to delay restarting heparin drip until tomorrow. Should it be in medical necessity, it can be started today, but do not bolus. I tried to place a Foley catheter at the end of the procedure, but he does have prostate cancer and  I could not place a Foley. A condom catheter is in place.  LOS: 10 days    Mellody Masri A 09/28/2015

## 2015-09-29 DIAGNOSIS — E876 Hypokalemia: Secondary | ICD-10-CM | POA: Diagnosis not present

## 2015-09-29 DIAGNOSIS — N179 Acute kidney failure, unspecified: Secondary | ICD-10-CM | POA: Diagnosis not present

## 2015-09-29 DIAGNOSIS — N183 Chronic kidney disease, stage 3 (moderate): Secondary | ICD-10-CM | POA: Diagnosis not present

## 2015-09-29 DIAGNOSIS — I482 Chronic atrial fibrillation: Secondary | ICD-10-CM | POA: Diagnosis not present

## 2015-09-29 DIAGNOSIS — E039 Hypothyroidism, unspecified: Secondary | ICD-10-CM | POA: Diagnosis not present

## 2015-09-29 DIAGNOSIS — I10 Essential (primary) hypertension: Secondary | ICD-10-CM | POA: Diagnosis not present

## 2015-09-29 DIAGNOSIS — E860 Dehydration: Secondary | ICD-10-CM | POA: Diagnosis not present

## 2015-09-29 LAB — CBC
HEMATOCRIT: 32.7 % — AB (ref 39.0–52.0)
HEMOGLOBIN: 10.3 g/dL — AB (ref 13.0–17.0)
MCH: 31.5 pg (ref 26.0–34.0)
MCHC: 31.5 g/dL (ref 30.0–36.0)
MCV: 100 fL (ref 78.0–100.0)
Platelets: 310 10*3/uL (ref 150–400)
RBC: 3.27 MIL/uL — ABNORMAL LOW (ref 4.22–5.81)
RDW: 13.2 % (ref 11.5–15.5)
WBC: 17.2 10*3/uL — AB (ref 4.0–10.5)

## 2015-09-29 LAB — BASIC METABOLIC PANEL
ANION GAP: 4 — AB (ref 5–15)
BUN: 23 mg/dL — ABNORMAL HIGH (ref 6–20)
CALCIUM: 7.1 mg/dL — AB (ref 8.9–10.3)
CHLORIDE: 109 mmol/L (ref 101–111)
CO2: 27 mmol/L (ref 22–32)
Creatinine, Ser: 2.44 mg/dL — ABNORMAL HIGH (ref 0.61–1.24)
GFR calc non Af Amer: 24 mL/min — ABNORMAL LOW (ref >60)
GFR, EST AFRICAN AMERICAN: 28 mL/min — AB (ref >60)
Glucose, Bld: 154 mg/dL — ABNORMAL HIGH (ref 65–99)
POTASSIUM: 4.3 mmol/L (ref 3.5–5.1)
Sodium: 140 mmol/L (ref 135–145)

## 2015-09-29 LAB — CULTURE, BLOOD (ROUTINE X 2)
Culture: NO GROWTH
Culture: NO GROWTH

## 2015-09-29 LAB — PROTIME-INR
INR: 1.69
Prothrombin Time: 20.1 seconds — ABNORMAL HIGH (ref 11.4–15.2)

## 2015-09-29 LAB — MAGNESIUM: Magnesium: 1.3 mg/dL — ABNORMAL LOW (ref 1.7–2.4)

## 2015-09-29 MED ORDER — MAGNESIUM SULFATE 4 GM/100ML IV SOLN
4.0000 g | Freq: Once | INTRAVENOUS | Status: AC
  Administered 2015-09-29: 4 g via INTRAVENOUS
  Filled 2015-09-29: qty 100

## 2015-09-29 NOTE — Progress Notes (Signed)
PROGRESS NOTE    [name] [name]  IDP:[id number] DOB: [birth year] DOA: 09/18/2015 PCP: [name], MD    Brief Narrative:  56 yom with a hx of afib on coumadin, CKD, HTN, hypothyroidism, and cardiomyopathy presented with complaints of nausea and vomiting. Patient had a syncopal episode that lasted a few seconds while attempting to stand. While in the ED, he was noted to be mildly tachycardic. He had an elevated BUN 95 and creatinine 6.53. He was found to have a strangulated right inguinal hernia and SBO. He had a small bowel resection and repair of the right inguinal hernia on 09/27/15. Will continue to manage.   Assessment & Plan:   Principal Problem:   AKI (acute kidney injury) ([place]) Active Problems:   Atrial fibrillation ([place])   Long term current use of anticoagulant therapy   Essential hypertension   CKD (chronic kidney disease) stage 3, GFR 30-59 ml/min   Orthostatic syncope   Vomiting   Dehydration   Cardiomyopathy (H[facility])   Hypokalemia   Warfarin-induced coagulopathy (HCC)   Constipation   Hypothyroidism   SBO (small bowel obstruction) ([place])   Incarcerated right inguinal hernia  1. Incarcerated-strangulated right inguinal hernia will SBO. CT of abdomen/pelvis revealed high-grade bowel obstruction due to incarcerated right inguinal hernia. An NG tube was placed. Surgery was consulted and preformed partial SBO resection and repair of the right inguinal hernia on 9/18. He has not had any recovery of bowel function yet. Continue supportive management.  2. Low-grade fever. Patient developed a low-grade fever on 9/15. His WBC count 17,200, starting to improve.  Repeat UA was unremarkable. Blood cultures remain negative. Will continue Continue Vancomycin and Zosyn for now.  3. Hyperkalemia. Resolved. 4. HTN. Stable.  5. AKI superimposed on CKD stage lll. Creatinine was 6.53 on admission.  His creatinine 2.44 has returned to baseline. WIll continue IVFs. Monitor intake and  output. Continue to hold ARB and lasix. 6. Chronic afib with RVR. He was anticoagulated with coumadin and coreg on admission. His INR was supratherapeutic at 4.88 on admission. Coreg was held and he is currently on IV lopressor and IV digoxin. Would not favor using digoxin with history of CKD. Anticoagulation held due to surgery, restart on IV heparin per surgery. Will likely transition to NOAC on discharge.  7. Hypothyroidism. TSH is within normal limits. Will continue IV synthroid.     DVT prophylaxis: scd.  Code Status: Full  Family Communication: No family bedside Disposition Plan: Discharge home once improved    Consultants:   General surgery   Procedures:   Partial small bowel resection and repair of incarcerated right inguinal hernia 09/27/2015  Antimicrobials:  Vancomycin 9/18>>  Zosyn 9/18>>  Cipro 9/17>>9/18  Flagyl 09/25/15>>9/18  Rocephin >> 09/25/15   Subjective: No bowel movements or passing flatus. No vomiting. He is coughing, no shortness of breath  Objective: Vitals:   09/29/15 0445 09/29/15 0500 09/29/15 0515 09/29/15 0530  BP: 118/66 126/72 128/69 138/76  Pulse: (!) 103 99 (!) 101 (!) 108  Resp: 13 14 12  (!) 7  Temp:      TempSrc:      SpO2: 97% 98% 100% 99%  Weight:      Height:        Intake/Output Summary (Last 24 hours) at 09/29/15 0554 Last data filed at 09/29/15 0400  Gross per 24 hour  Intake             3094 ml  Output  0 ml  Net             3094 ml   Filed Weights   09/27/15 0500 09/27/15 0925 09/28/15 0500  Weight: 111.6 kg (246 lb 0.5 oz) 111.6 kg (246 lb) 113.1 kg (249 lb 5.4 oz)    Examination:  General exam: Appears calm and comfortable  Respiratory system: Clear to auscultation. Respiratory effort normal. Cardiovascular system: S1 & S2 heard, irregular. No JVD, murmurs, rubs, gallops or clicks. No pedal edema. Gastrointestinal system: Abdomen is nondistended, soft and diffusely tender. No organomegaly or  masses felt. Normal bowel sounds heard. Central nervous system: Alert and oriented. No focal neurological deficits. Extremities: Symmetric 5 x 5 power. Skin: No rashes, lesions or ulcers Psychiatry: Judgement and insight appear normal. Mood & affect appropriate.     Data Reviewed: I have personally reviewed following labs and imaging studies  CBC:  Recent Labs Lab 09/23/15 0651 09/25/15 0519 09/26/15 0400 09/27/15 0435 09/28/15 0427  WBC 11.1* 12.7* 12.0* 13.0* 20.3*  NEUTROABS  --   --   --  9.3*  --   HGB 12.7* 11.1* 10.5* 10.8* 11.2*  HCT 38.7* 34.5* 33.4* 34.2* 35.6*  MCV 97.2 100.0 102.1* 101.8* 101.1*  PLT 257 270 313 337 195   Basic Metabolic Panel:  Recent Labs Lab 09/24/15 0548 09/25/15 0519 09/26/15 0400 09/27/15 0435 09/28/15 0427  NA 140 141 140 142 139  K 4.1 4.8 4.3 4.5 5.5*  CL 102 108 112* 112* 111  CO2 29 28 25 25 24   GLUCOSE 147* 140* 126* 111* 104*  BUN 52* 31* 20 15 22*  CREATININE 2.67* 2.31* 1.89* 1.75* 2.71*  CALCIUM 7.2* 7.4* 7.4* 7.8* 7.4*  MG 2.0  --   --   --  1.3*  PHOS  --   --   --   --  4.2   GFR: Estimated Creatinine Clearance: 31.5 mL/min (by C-G formula based on SCr of 2.71 mg/dL (H)). Liver Function Tests:  Recent Labs Lab 09/23/15 0651  AST 17  ALT 17  ALKPHOS 44  BILITOT 0.8  PROT 6.0*  ALBUMIN 2.6*   No results for input(s): LIPASE, AMYLASE in the last 168 hours. No results for input(s): AMMONIA in the last 168 hours. Coagulation Profile:  Recent Labs Lab 09/24/15 0548 09/25/15 0519 09/26/15 0400 09/27/15 0435 09/28/15 0427  INR 1.74 1.43 1.34 1.31 1.37   Cardiac Enzymes: No results for input(s): CKTOTAL, CKMB, CKMBINDEX, TROPONINI in the last 168 hours. BNP (last 3 results) No results for input(s): PROBNP in the last 8760 hours. HbA1C: No results for input(s): HGBA1C in the last 72 hours. CBG:  Recent Labs Lab 09/28/15 2143  GLUCAP 113*   Lipid Profile: No results for input(s): CHOL, HDL,  LDLCALC, TRIG, CHOLHDL, LDLDIRECT in the last 72 hours. Thyroid Function Tests: No results for input(s): TSH, T4TOTAL, FREET4, T3FREE, THYROIDAB in the last 72 hours. Anemia Panel: No results for input(s): VITAMINB12, FOLATE, FERRITIN, TIBC, IRON, RETICCTPCT in the last 72 hours. Sepsis Labs:  Recent Labs Lab 09/22/15 1657  LATICACIDVEN 1.2    Recent Results (from the past 240 hour(s))  Urine culture     Status: None   Collection Time: 09/21/15 10:51 AM  Result Value Ref Range Status   Specimen Description URINE, CLEAN CATCH  Final   Special Requests NONE  Final   Culture NO GROWTH Performed at [facility]   Final   Report Status 09/23/2015 FINAL  Final  MRSA PCR  Screening     Status: None   Collection Time: 09/24/15  2:45 PM  Result Value Ref Range Status   MRSA by PCR NEGATIVE NEGATIVE Final    Comment:        The GeneXpert MRSA Assay (FDA approved for NASAL specimens only), is one component of a comprehensive MRSA colonization surveillance program. It is not intended to diagnose MRSA infection nor to guide or monitor treatment for MRSA infections.   Culture, blood (Routine X 2) w Reflex to ID Panel     Status: None (Preliminary result)   Collection Time: 09/24/15  5:37 PM  Result Value Ref Range Status   Specimen Description RIGHT ANTECUBITAL  Final   Special Requests BOTTLES DRAWN AEROBIC AND ANAEROBIC 6CC EACH  Final   Culture NO GROWTH 4 DAYS  Final   Report Status PENDING  Incomplete  Culture, blood (Routine X 2) w Reflex to ID Panel     Status: None (Preliminary result)   Collection Time: 09/24/15  7:05 PM  Result Value Ref Range Status   Specimen Description BLOOD RIGHT HAND  Final   Special Requests BOTTLES DRAWN AEROBIC AND ANAEROBIC 8CC EACH  Final   Culture NO GROWTH 4 DAYS  Final   Report Status PENDING  Incomplete         Radiology Studies: No results found.      Scheduled Meds: . chlorhexidine  15 mL Mouth Rinse BID  . digoxin   0.125 mg Intravenous Daily  . famotidine (PEPCID) IV  20 mg Intravenous Q24H  . levothyroxine  25 mcg Intravenous Daily  . mouth rinse  15 mL Mouth Rinse q12n4p  . metoprolol  5 mg Intravenous Q8H  . piperacillin-tazobactam (ZOSYN)  IV  3.375 g Intravenous Q8H  . vancomycin  1,250 mg Intravenous Q24H   Continuous Infusions: . dextrose 5 % and 0.45% NaCl 1,000 mL with sodium bicarbonate 50 mEq infusion 135 mL/hr at 09/29/15 0144  . diltiazem (CARDIZEM) infusion 5 mg/hr (09/28/15 1900)  . lactated ringers 100 mL/hr at 09/28/15 0600     LOS: 11 days    Time spent: 25 minutes     Kathie Dike, MD [facility] If 7PM-7AM, please contact night-coverage [email or web address] Password [email or web address] 09/29/2015, 5:54 AM

## 2015-09-29 NOTE — Progress Notes (Signed)
2 Days Post-Op  Subjective: Patient sedated. Somewhat arousable.  Objective: Vital signs in last 24 hours: Temp:  [97.1 F (36.2 C)-98.5 F (36.9 C)] 97.8 F (36.6 C) (09/20 0748) Pulse Rate:  [35-141] 108 (09/20 0530) Resp:  [7-29] 7 (09/20 0530) BP: (96-155)/(56-131) 138/76 (09/20 0530) SpO2:  [76 %-100 %] 99 % (09/20 0530) Weight:  [116.3 kg (256 lb 6.3 oz)] 116.3 kg (256 lb 6.3 oz) (09/20 0500) Last BM Date: 09/12/15  Intake/Output from previous day: 09/19 0701 - 09/20 0700 In: 2939 [I.V.:2589; IV Piggyback:350] Out: 1100 [Emesis/NG output:1100] Intake/Output this shift: No intake/output data recorded.  General appearance: no distress GI: Soft. Occasional bowel sounds appreciated. Incisions healing well.  Lab Results:   Recent Labs  09/28/15 0427 09/29/15 0839  WBC 20.3* 17.2*  HGB 11.2* 10.3*  HCT 35.6* 32.7*  PLT 345 310   BMET  Recent Labs  09/28/15 0427 09/29/15 0839  NA 139 140  K 5.5* 4.3  CL 111 109  CO2 24 27  GLUCOSE 104* 154*  BUN 22* 23*  CREATININE 2.71* 2.44*  CALCIUM 7.4* 7.1*   PT/INR  Recent Labs  09/28/15 0427 09/29/15 0502  LABPROT 17.0* 20.1*  INR 1.37 1.69    Studies/Results: No results found.  Anti-infectives: Anti-infectives    Start     Dose/Rate Route Frequency Ordered Stop   09/28/15 1500  vancomycin (VANCOCIN) 1,250 mg in sodium chloride 0.9 % 250 mL IVPB     1,250 mg 166.7 mL/hr over 90 Minutes Intravenous Every 24 hours 09/27/15 1437     09/27/15 1445  piperacillin-tazobactam (ZOSYN) IVPB 3.375 g     3.375 g 12.5 mL/hr over 240 Minutes Intravenous Every 8 hours 09/27/15 1443     09/27/15 1445  vancomycin (VANCOCIN) 2,000 mg in sodium chloride 0.9 % 500 mL IVPB     2,000 mg 250 mL/hr over 120 Minutes Intravenous  Once 09/27/15 1434 09/27/15 1713   09/26/15 0915  ciprofloxacin (CIPRO) IVPB 400 mg  Status:  Discontinued     400 mg 200 mL/hr over 60 Minutes Intravenous Every 12 hours 09/26/15 0914 09/27/15  1426   09/25/15 1030  metroNIDAZOLE (FLAGYL) IVPB 500 mg  Status:  Discontinued     500 mg 100 mL/hr over 60 Minutes Intravenous Every 8 hours 09/25/15 0934 09/27/15 1426   09/19/15 0400  cefTRIAXone (ROCEPHIN) 1 g in dextrose 5 % 50 mL IVPB     1 g 100 mL/hr over 30 Minutes Intravenous Every 24 hours 09/18/15 0522 09/23/15 0441   09/18/15 0415  cefTRIAXone (ROCEPHIN) 1 g in dextrose 5 % 50 mL IVPB     1 g 100 mL/hr over 30 Minutes Intravenous  Once 09/18/15 0406 09/18/15 0457      Assessment/Plan: s/p Procedure(s): RIGHT INGUINAL HERNIORRHAPY WITH MESH SMALL BOWEL RESECTION Impression: Stable on postoperative day 2. Awaiting return of bowel function. May continue to hold heparin drip until tomorrow as needed. If it needs to be started, we can watch his hemoglobin. We'll remove NG tube in for now.  LOS: 11 days    Carling Liberman A 09/29/2015

## 2015-09-29 NOTE — Progress Notes (Signed)
Patient HR consistently in the 130s. Cardizem Drip restarted at 5mg /hr. Will attempt to wean down to off as tolerated.

## 2015-09-30 DIAGNOSIS — I10 Essential (primary) hypertension: Secondary | ICD-10-CM | POA: Diagnosis not present

## 2015-09-30 DIAGNOSIS — I482 Chronic atrial fibrillation: Secondary | ICD-10-CM | POA: Diagnosis not present

## 2015-09-30 DIAGNOSIS — E039 Hypothyroidism, unspecified: Secondary | ICD-10-CM | POA: Diagnosis not present

## 2015-09-30 DIAGNOSIS — N179 Acute kidney failure, unspecified: Secondary | ICD-10-CM | POA: Diagnosis not present

## 2015-09-30 DIAGNOSIS — N183 Chronic kidney disease, stage 3 (moderate): Secondary | ICD-10-CM | POA: Diagnosis not present

## 2015-09-30 DIAGNOSIS — E860 Dehydration: Secondary | ICD-10-CM | POA: Diagnosis not present

## 2015-09-30 DIAGNOSIS — E876 Hypokalemia: Secondary | ICD-10-CM | POA: Diagnosis not present

## 2015-09-30 LAB — CBC
HEMATOCRIT: 32.7 % — AB (ref 39.0–52.0)
Hemoglobin: 10.3 g/dL — ABNORMAL LOW (ref 13.0–17.0)
MCH: 31.4 pg (ref 26.0–34.0)
MCHC: 31.5 g/dL (ref 30.0–36.0)
MCV: 99.7 fL (ref 78.0–100.0)
PLATELETS: 338 10*3/uL (ref 150–400)
RBC: 3.28 MIL/uL — AB (ref 4.22–5.81)
RDW: 13.1 % (ref 11.5–15.5)
WBC: 13.8 10*3/uL — AB (ref 4.0–10.5)

## 2015-09-30 LAB — BASIC METABOLIC PANEL
ANION GAP: 6 (ref 5–15)
BUN: 17 mg/dL (ref 6–20)
CALCIUM: 7.3 mg/dL — AB (ref 8.9–10.3)
CO2: 29 mmol/L (ref 22–32)
CREATININE: 2.02 mg/dL — AB (ref 0.61–1.24)
Chloride: 104 mmol/L (ref 101–111)
GFR, EST AFRICAN AMERICAN: 35 mL/min — AB (ref >60)
GFR, EST NON AFRICAN AMERICAN: 31 mL/min — AB (ref >60)
Glucose, Bld: 147 mg/dL — ABNORMAL HIGH (ref 65–99)
Potassium: 3.7 mmol/L (ref 3.5–5.1)
Sodium: 139 mmol/L (ref 135–145)

## 2015-09-30 LAB — PROTIME-INR
INR: 1.45
Prothrombin Time: 17.7 seconds — ABNORMAL HIGH (ref 11.4–15.2)

## 2015-09-30 LAB — HEPARIN LEVEL (UNFRACTIONATED)

## 2015-09-30 MED ORDER — VANCOMYCIN HCL 10 G IV SOLR
1250.0000 mg | INTRAVENOUS | Status: DC
  Administered 2015-09-30: 1250 mg via INTRAVENOUS
  Filled 2015-09-30 (×4): qty 1250

## 2015-09-30 MED ORDER — HEPARIN (PORCINE) IN NACL 100-0.45 UNIT/ML-% IJ SOLN
2050.0000 [IU]/h | INTRAMUSCULAR | Status: DC
  Administered 2015-09-30: 1800 [IU]/h via INTRAVENOUS
  Administered 2015-10-01: 2050 [IU]/h via INTRAVENOUS
  Administered 2015-10-01: 1850 [IU]/h via INTRAVENOUS
  Administered 2015-10-02: 2050 [IU]/h via INTRAVENOUS
  Filled 2015-09-30 (×4): qty 250

## 2015-09-30 MED ORDER — BISACODYL 10 MG RE SUPP
10.0000 mg | Freq: Two times a day (BID) | RECTAL | Status: DC
  Administered 2015-09-30 – 2015-10-01 (×3): 10 mg via RECTAL
  Filled 2015-09-30 (×3): qty 1

## 2015-09-30 NOTE — Progress Notes (Signed)
PROGRESS NOTE    [name] [name]  NID:[id number] DOB: 1940/03/13 DOA: 09/18/2015 PCP: [name], MD  Brief Narrative:  36 yom with a hx of afib on coumadin, CKD, HTN, hypothyroidism, and cardiomyopathy presented with complaints of nausea and vomiting. Patient had a syncopal episode that lasted a few seconds while attempting to stand. While in the ED, he was noted to be mildly tachycardic. He had an elevated BUN 95 and creatinine 6.53. He was found to have a strangulated right inguinal hernia and SBO. He had a small bowel resection and repair of the right inguinal hernia on 09/27/15. Will continue to manage.   Assessment & Plan:   Principal Problem:   AKI (acute kidney injury) ([place]) Active Problems:   Atrial fibrillation ([place])   Long term current use of anticoagulant therapy   Essential hypertension   CKD (chronic kidney disease) stage 3, GFR 30-59 ml/min   Orthostatic syncope   Vomiting   Dehydration   Cardiomyopathy (HCC)   Hypokalemia   Warfarin-induced coagulopathy ([facility])   Constipation   Hypothyroidism   SBO (small bowel obstruction) ([place])   Incarcerated right inguinal hernia  1. Incarcerated-strangulated right inguinal hernia with SBO. CT of abdomen/pelvis revealed high-grade bowel obstruction due to incarcerated right inguinal hernia. An NG tube was placed. Surgery was consulted and preformed partial SBO resection and repair of the right inguinal hernia on 9/18. He has not had any recovery of bowel function yet. He will be started on suppositories today. Continue supportive management.  2. Low-grade fever. Patient developed a low-grade fever on 9/15. His WBC count 13,800, starting to improve.  Repeat UA was unremarkable. Blood cultures remain negative. Continue Vancomycin and Zosyn for now.  3. Hyperkalemia. Resolved. 4. HTN. Stable.  5. AKI superimposed on CKD stage lll. Creatinine was 6.53 on admission.  His creatinine 2.02 has returned to baseline. WIll continue  IVFs. Monitor intake and output. Continue to hold ARB and lasix. 6. Chronic afib with RVR. He was anticoagulated with coumadin and coreg on admission. His INR was supratherapeutic at 4.88 on admission. Coreg was held and he is currently on IV lopressor. Will start the patient on IV heparin per pharmacy without bolus. Will likely transition to NOAC on discharge.  7. Hypothyroidism. TSH is within normal limits. Will continue IV synthroid.   DVT prophylaxis: SCDs Code Status: Full Family Communication: No family bedside Disposition Plan: Discharge home once improved.    Consultants:   General surgery   Procedures:   Partial small bowel resection and repair of incarcerated right inguinal hernia 09/27/2015  Antimicrobials:   Vancomycin 9/18>>  Zosyn 9/18>>  Cipro 9/17>>9/18  Flagyl 09/25/15>>9/18  Rocephin >> 09/25/15   Subjective: No abdominal pain. No vomiting. No bowel movements or flatus. No shortness of breath  Objective: Vitals:   09/30/15 0500 09/30/15 0515 09/30/15 0530 09/30/15 0545  BP: (!) 148/73 (!) 150/75 140/70 (!) 147/72  Pulse: 95 100 97 94  Resp: 17 17 16 18   Temp:      TempSrc:      SpO2: 99% 98% 98% 98%  Weight: 115.7 kg (255 lb 1.2 oz)     Height:        Intake/Output Summary (Last 24 hours) at 09/30/15 0611 Last data filed at 09/30/15 0500  Gross per 24 hour  Intake             2495 ml  Output             2350 ml  Net              145 ml   Filed Weights   09/28/15 0500 09/29/15 0500 09/30/15 0500  Weight: 113.1 kg (249 lb 5.4 oz) 116.3 kg (256 lb 6.3 oz) 115.7 kg (255 lb 1.2 oz)    Examination:  General exam: Appears calm and comfortable  Respiratory system: Clear to auscultation. Respiratory effort normal. Cardiovascular system: S1 & S2 heard, RRR. No JVD, murmurs, rubs, gallops or clicks. No pedal edema. Gastrointestinal system: Abdomen is nondistended, soft and nontender. No organomegaly or masses felt. Normal bowel sounds  heard. Central nervous system: Alert and oriented. No focal neurological deficits. Extremities: Symmetric 5 x 5 power. Skin: No rashes, lesions or ulcers Psychiatry: Judgement and insight appear normal. Mood & affect appropriate.     Data Reviewed: I have personally reviewed following labs and imaging studies  CBC:  Recent Labs Lab 09/26/15 0400 09/27/15 0435 09/28/15 0427 09/29/15 0839 09/30/15 0428  WBC 12.0* 13.0* 20.3* 17.2* 13.8*  NEUTROABS  --  9.3*  --   --   --   HGB 10.5* 10.8* 11.2* 10.3* 10.3*  HCT 33.4* 34.2* 35.6* 32.7* 32.7*  MCV 102.1* 101.8* 101.1* 100.0 99.7  PLT 313 337 345 310 546   Basic Metabolic Panel:  Recent Labs Lab 09/24/15 0548  09/26/15 0400 09/27/15 0435 09/28/15 0427 09/29/15 0839 09/30/15 0428  NA 140  < > 140 142 139 140 139  K 4.1  < > 4.3 4.5 5.5* 4.3 3.7  CL 102  < > 112* 112* 111 109 104  CO2 29  < > 25 25 24 27 29   GLUCOSE 147*  < > 126* 111* 104* 154* 147*  BUN 52*  < > 20 15 22* 23* 17  CREATININE 2.67*  < > 1.89* 1.75* 2.71* 2.44* 2.02*  CALCIUM 7.2*  < > 7.4* 7.8* 7.4* 7.1* 7.3*  MG 2.0  --   --   --  1.3* 1.3*  --   PHOS  --   --   --   --  4.2  --   --   < > = values in this interval not displayed. GFR: Estimated Creatinine Clearance: 42.7 mL/min (by C-G formula based on SCr of 2.02 mg/dL (H)). Liver Function Tests:  Recent Labs Lab 09/23/15 0651  AST 17  ALT 17  ALKPHOS 44  BILITOT 0.8  PROT 6.0*  ALBUMIN 2.6*   No results for input(s): LIPASE, AMYLASE in the last 168 hours. No results for input(s): AMMONIA in the last 168 hours. Coagulation Profile:  Recent Labs Lab 09/26/15 0400 09/27/15 0435 09/28/15 0427 09/29/15 0502 09/30/15 0428  INR 1.34 1.31 1.37 1.69 1.45   Cardiac Enzymes: No results for input(s): CKTOTAL, CKMB, CKMBINDEX, TROPONINI in the last 168 hours. BNP (last 3 results) No results for input(s): PROBNP in the last 8760 hours. HbA1C: No results for input(s): HGBA1C in the last 72  hours. CBG:  Recent Labs Lab 09/28/15 2143  GLUCAP 113*   Lipid Profile: No results for input(s): CHOL, HDL, LDLCALC, TRIG, CHOLHDL, LDLDIRECT in the last 72 hours. Thyroid Function Tests: No results for input(s): TSH, T4TOTAL, FREET4, T3FREE, THYROIDAB in the last 72 hours. Anemia Panel: No results for input(s): VITAMINB12, FOLATE, FERRITIN, TIBC, IRON, RETICCTPCT in the last 72 hours. Sepsis Labs: No results for input(s): PROCALCITON, LATICACIDVEN in the last 168 hours.  Recent Results (from the past 240 hour(s))  Urine culture     Status: None  Collection Time: 09/21/15 10:51 AM  Result Value Ref Range Status   Specimen Description URINE, CLEAN CATCH  Final   Special Requests NONE  Final   Culture NO GROWTH Performed at [facility]   Final   Report Status 09/23/2015 FINAL  Final  MRSA PCR Screening     Status: None   Collection Time: 09/24/15  2:45 PM  Result Value Ref Range Status   MRSA by PCR NEGATIVE NEGATIVE Final    Comment:        The GeneXpert MRSA Assay (FDA approved for NASAL specimens only), is one component of a comprehensive MRSA colonization surveillance program. It is not intended to diagnose MRSA infection nor to guide or monitor treatment for MRSA infections.   Culture, blood (Routine X 2) w Reflex to ID Panel     Status: None   Collection Time: 09/24/15  5:37 PM  Result Value Ref Range Status   Specimen Description RIGHT ANTECUBITAL  Final   Special Requests BOTTLES DRAWN AEROBIC AND ANAEROBIC [place]  Final   Culture NO GROWTH 5 DAYS  Final   Report Status 09/29/2015 FINAL  Final  Culture, blood (Routine X 2) w Reflex to ID Panel     Status: None   Collection Time: 09/24/15  7:05 PM  Result Value Ref Range Status   Specimen Description BLOOD RIGHT HAND  Final   Special Requests BOTTLES DRAWN AEROBIC AND ANAEROBIC [place]  Final   Culture NO GROWTH 5 DAYS  Final   Report Status 09/29/2015 FINAL  Final         Radiology  Studies: No results found.      Scheduled Meds: . chlorhexidine  15 mL Mouth Rinse BID  . famotidine (PEPCID) IV  20 mg Intravenous Q24H  . levothyroxine  25 mcg Intravenous Daily  . mouth rinse  15 mL Mouth Rinse q12n4p  . metoprolol  5 mg Intravenous Q8H  . piperacillin-tazobactam (ZOSYN)  IV  3.375 g Intravenous Q8H  . vancomycin  1,250 mg Intravenous Q24H   Continuous Infusions: . dextrose 5 % and 0.45% NaCl 1,000 mL with sodium bicarbonate 50 mEq infusion 135 mL/hr at 09/30/15 0123  . diltiazem (CARDIZEM) infusion 5 mg/hr (09/29/15 1145)  . lactated ringers 100 mL/hr at 09/28/15 0600     LOS: 12 days    Time spent: 25 minutes     Kathie Dike, MD [facility] If 7PM-7AM, please contact night-coverage [email or web address] Password [email or web address] 09/30/2015, 6:11 AM

## 2015-09-30 NOTE — Progress Notes (Signed)
3 Days Post-Op  Subjective: Patient awake and alert. Denies any abdominal pain.  Objective: Vital signs in last 24 hours: Temp:  [97.5 F (36.4 C)-98.9 F (37.2 C)] 98.9 F (37.2 C) (09/21 0742) Pulse Rate:  [89-111] 101 (09/21 0645) Resp:  [14-26] 18 (09/21 0645) BP: (112-150)/(60-80) 141/74 (09/21 0645) SpO2:  [95 %-100 %] 98 % (09/21 0645) Weight:  [115.7 kg (255 lb 1.2 oz)] 115.7 kg (255 lb 1.2 oz) (09/21 0500) Last BM Date: 09/12/15  Intake/Output from previous day: 09/20 0701 - 09/21 0700 In: 4060 [I.V.:3660; IV Piggyback:400] Out: 3350 [Urine:2200; Emesis/NG output:1150] Intake/Output this shift: No intake/output data recorded.  General appearance: alert, cooperative and no distress GI: Soft. Incisions healing well. Minimal bowel sounds appreciated.  Lab Results:   Recent Labs  09/29/15 0839 09/30/15 0428  WBC 17.2* 13.8*  HGB 10.3* 10.3*  HCT 32.7* 32.7*  PLT 310 338   BMET  Recent Labs  09/29/15 0839 09/30/15 0428  NA 140 139  K 4.3 3.7  CL 109 104  CO2 27 29  GLUCOSE 154* 147*  BUN 23* 17  CREATININE 2.44* 2.02*  CALCIUM 7.1* 7.3*   PT/INR  Recent Labs  09/29/15 0502 09/30/15 0428  LABPROT 20.1* 17.7*  INR 1.69 1.45    Studies/Results: No results found.  Anti-infectives: Anti-infectives    Start     Dose/Rate Route Frequency Ordered Stop   09/28/15 1500  vancomycin (VANCOCIN) 1,250 mg in sodium chloride 0.9 % 250 mL IVPB     1,250 mg 166.7 mL/hr over 90 Minutes Intravenous Every 24 hours 09/27/15 1437     09/27/15 1445  piperacillin-tazobactam (ZOSYN) IVPB 3.375 g     3.375 g 12.5 mL/hr over 240 Minutes Intravenous Every 8 hours 09/27/15 1443     09/27/15 1445  vancomycin (VANCOCIN) 2,000 mg in sodium chloride 0.9 % 500 mL IVPB     2,000 mg 250 mL/hr over 120 Minutes Intravenous  Once 09/27/15 1434 09/27/15 1713   09/26/15 0915  ciprofloxacin (CIPRO) IVPB 400 mg  Status:  Discontinued     400 mg 200 mL/hr over 60 Minutes  Intravenous Every 12 hours 09/26/15 0914 09/27/15 1426   09/25/15 1030  metroNIDAZOLE (FLAGYL) IVPB 500 mg  Status:  Discontinued     500 mg 100 mL/hr over 60 Minutes Intravenous Every 8 hours 09/25/15 0934 09/27/15 1426   09/19/15 0400  cefTRIAXone (ROCEPHIN) 1 g in dextrose 5 % 50 mL IVPB     1 g 100 mL/hr over 30 Minutes Intravenous Every 24 hours 09/18/15 0522 09/23/15 0441   09/18/15 0415  cefTRIAXone (ROCEPHIN) 1 g in dextrose 5 % 50 mL IVPB     1 g 100 mL/hr over 30 Minutes Intravenous  Once 09/18/15 0406 09/18/15 0457      Assessment/Plan: s/p Procedure(s): RIGHT INGUINAL HERNIORRHAPY WITH MESH SMALL BOWEL RESECTION Impression: Stable on postoperative day 3. Awaiting return of bowel function. NG tube output was over a liter again as per nursing. Will start Dulcolax suppositories. May restart heparin drip as needed. Hemoglobin is stable.  LOS: 12 days    Cristen Bredeson A 09/30/2015

## 2015-09-30 NOTE — Progress Notes (Signed)
ANTICOAGULATION CONSULT NOTE  Pharmacy Consult for Heparin Indication: atrial fibrillation  No Known Allergies  Patient Measurements: Height: 6\' 2"  (188 cm) Weight: 255 lb 1.2 oz (115.7 kg) IBW/kg (Calculated) : 82.2  Vital Signs: Temp: 98.9 F (37.2 C) (09/21 0742) Temp Source: Oral (09/21 0742) BP: 141/74 (09/21 0645) Pulse Rate: 101 (09/21 0645)  Labs:  Recent Labs  09/28/15 0427 09/29/15 0502 09/29/15 0839 09/30/15 0428  HGB 11.2*  --  10.3* 10.3*  HCT 35.6*  --  32.7* 32.7*  PLT 345  --  310 338  LABPROT 17.0* 20.1*  --  17.7*  INR 1.37 1.69  --  1.45  CREATININE 2.71*  --  2.44* 2.02*   Estimated Creatinine Clearance: 42.7 mL/min (by C-G formula based on SCr of 2.02 mg/dL (H)).  Medical History: Past Medical History:  Diagnosis Date  . AF (atrial fibrillation) ([place])   . Cardiomyopathy ([place])    h/o  . Chronic kidney disease, stage 3   . Dysrhythmia   . Hypothyroidism   . Pneumonia   . Prostate cancer ([place])   . Shortness of breath   . Systemic hypertension     Medications:  Prescriptions Prior to Admission  Medication Sig Dispense Refill Last Dose  . Calcium Carbonate-Vitamin D (CALCIUM 600+D) 600-400 MG-UNIT tablet Take 1 tablet by mouth 2 (two) times daily.   09/17/2015 at Unknown time  . carvedilol (COREG) 25 MG tablet Take 37.5 mg by mouth 2 (two) times daily with a meal.   09/17/2015 at 1900  . furosemide (LASIX) 20 MG tablet Take 1 tablet (20 mg total) by mouth daily. (Patient taking differently: Take 20 mg by mouth every other day. ) 30 tablet 0 Past Week at Unknown time  . levothyroxine (SYNTHROID, LEVOTHROID) 50 MCG tablet Take 1 tablet (50 mcg total) by mouth every morning. 30 tablet 6 09/17/2015 at Unknown time  . losartan (COZAAR) 25 MG tablet Take 25 mg by mouth daily.   09/17/2015 at Unknown time  . Vitamin D, Ergocalciferol, (DRISDOL) 50000 units CAPS capsule Take 50,000 Units by mouth every 30 (thirty) days.   Past Month at Unknown time  .  acetaminophen (TYLENOL) 500 MG tablet Take 1,000 mg by mouth every 6 (six) hours as needed for mild pain, moderate pain or headache.   Unknown at Unknown time  . warfarin (COUMADIN) 4 MG tablet Take 4 mg by mouth every evening.   Unknown at Unknown time   Assessment: Heparin drip was on hold post op, asked to resume today with no bolus per MD.  Goal of Therapy:  Monitor platelets by anticoagulation protocol: Yes  Heparin level 0.3-0.7 units/ml   Plan:  Resume heparin drip at 1800 units/hr, NO BOLUS Check HL in ~ 6 hrs Daily HL and CBC while on heparin Monitor for bleeding coomplications  Hart Robinsons, PharmD Clinical Pharmacist Pager:  [phone] 09/30/2015

## 2015-09-30 NOTE — Care Management Note (Signed)
Case Management Note  Patient Details  Name: [name] [name] MRN: [id number] Date of Birth: 25-Feb-1940  If discussed at [facility] Length of Stay Meetings, dates discussed:  09/30/2015  Additional Comments: Will need another PT eval when appropriate. Will cont to follow.   Sherald Barge, RN 09/30/2015, 3:56 PM

## 2015-09-30 NOTE — Progress Notes (Signed)
ANTICOAGULATION CONSULT NOTE  Pharmacy Consult for Heparin Indication: atrial fibrillation  No Known Allergies  Patient Measurements: Height: 6\' 2"  (188 cm) Weight: 255 lb 1.2 oz (115.7 kg) IBW/kg (Calculated) : 82.2  Vital Signs: Temp: 97.9 F (36.6 C) (09/21 1637) Temp Source: Oral (09/21 1637) BP: 156/82 (09/21 0800) Pulse Rate: 100 (09/21 0800)  Labs:  Recent Labs  09/28/15 0427 09/29/15 0502 09/29/15 0839 09/30/15 0428 09/30/15 1619  HGB 11.2*  --  10.3* 10.3*  --   HCT 35.6*  --  32.7* 32.7*  --   PLT 345  --  310 338  --   LABPROT 17.0* 20.1*  --  17.7*  --   INR 1.37 1.69  --  1.45  --   HEPARINUNFRC  --   --   --   --  <0.10*  CREATININE 2.71*  --  2.44* 2.02*  --    Estimated Creatinine Clearance: 42.7 mL/min (by C-G formula based on SCr of 2.02 mg/dL (H)).  Medical History: Past Medical History:  Diagnosis Date  . AF (atrial fibrillation) ([place])   . Cardiomyopathy ([place])    h/o  . Chronic kidney disease, stage 3   . Dysrhythmia   . Hypothyroidism   . Pneumonia   . Prostate cancer ([place])   . Shortness of breath   . Systemic hypertension    Medications:  Prescriptions Prior to Admission  Medication Sig Dispense Refill Last Dose  . Calcium Carbonate-Vitamin D (CALCIUM 600+D) 600-400 MG-UNIT tablet Take 1 tablet by mouth 2 (two) times daily.   09/17/2015 at Unknown time  . carvedilol (COREG) 25 MG tablet Take 37.5 mg by mouth 2 (two) times daily with a meal.   09/17/2015 at 1900  . furosemide (LASIX) 20 MG tablet Take 1 tablet (20 mg total) by mouth daily. (Patient taking differently: Take 20 mg by mouth every other day. ) 30 tablet 0 Past Week at Unknown time  . levothyroxine (SYNTHROID, LEVOTHROID) 50 MCG tablet Take 1 tablet (50 mcg total) by mouth every morning. 30 tablet 6 09/17/2015 at Unknown time  . losartan (COZAAR) 25 MG tablet Take 25 mg by mouth daily.   09/17/2015 at Unknown time  . Vitamin D, Ergocalciferol, (DRISDOL) 50000 units CAPS capsule Take  50,000 Units by mouth every 30 (thirty) days.   Past Month at Unknown time  . acetaminophen (TYLENOL) 500 MG tablet Take 1,000 mg by mouth every 6 (six) hours as needed for mild pain, moderate pain or headache.   Unknown at Unknown time  . warfarin (COUMADIN) 4 MG tablet Take 4 mg by mouth every evening.   Unknown at Unknown time   Assessment: Heparin drip was on hold post op, asked to resume today with no bolus per MD.  Heparin drip started later than initial order.  Heparin level from this after noon roughly 4 hours after initiation.  Previously slightly below therapeutic level at 1700 units./hr.  No bleeding noted.  Goal of Therapy:  Monitor platelets by anticoagulation protocol: Yes  Heparin level 0.3-0.7 units/ml   Plan:  Increase heparin drip to1850 units/hr Check am labs in ~ 6-8 hrs Daily HL and CBC while on heparin Monitor for bleeding coomplications  Pricilla Larsson, [facility]  09/30/2015  7:51 PM

## 2015-09-30 NOTE — Progress Notes (Signed)
Pharmacy Antibiotic Note  [name] [name] is a 75 y.o. male admitted on 09/18/2015 with intra-abdominal infection.  He is s/p partial bowel resection and repair inguinal hernia Pharmacy has been consulted for vancomycin dosing.  Plan: Continue Vancomycin 1250 mg IV q24 hours (consider d/c Vanc as MRSA PCR is neg, cx's neg) Check trough level at SS if Vanc continues Continue Zosyn 3.375gm IV q8h per MD F/u renal function, cultures and clinical course  Height: 6\' 2"  (188 cm) Weight: 255 lb 1.2 oz (115.7 kg) IBW/kg (Calculated) : 82.2  Temp (24hrs), Avg:98 F (36.7 C), Min:97.5 F (36.4 C), Max:98.9 F (37.2 C)   Recent Labs Lab 09/26/15 0400 09/27/15 0435 09/28/15 0427 09/29/15 0839 09/30/15 0428  WBC 12.0* 13.0* 20.3* 17.2* 13.8*  CREATININE 1.89* 1.75* 2.71* 2.44* 2.02*    Estimated Creatinine Clearance: 42.7 mL/min (by C-G formula based on SCr of 2.02 mg/dL (H)).    No Known Allergies  Antimicrobials this admission: 9/16 flagyl >> 9/18 cipro 9/17 >> 9/18 vanc  9/18>> Zosyn  9/18>>  Thank you for allowing pharmacy to be a part of this patient's care.  Hart Robinsons, PharmD Clinical Pharmacist Pager:  [phone] 09/30/2015

## 2015-09-30 NOTE — Progress Notes (Signed)
Initial Nutrition Assessment  DOCUMENTATION CODES:   Severe malnutrition in context of acute illness/injury, Obesity unspecified  INTERVENTION:  Recommend consider TPN if unable to advance diet or  feed enterally.   NUTRITION DIAGNOSIS:   Malnutrition related to acute illness as evidenced by energy intake < or equal to 50% for > or equal to 5 days, moderate depletions of muscle mass.  GOAL:   Provide needs based on ASPEN/SCCM guidelines   MONITOR: nutrition support measures, labs, bowel function , I/O's and skin    REASON FOR ASSESSMENT:   NPO/Clear Liquid Diet, LOS    ASSESSMENT:   [name] [name] is a 75 y.o. male with medical history significant of HTN, CKD 3. He presents  On 9/9 with he presents with 5 days of persistent vomiting, and no diarrhea or constipation.  Denies pain with eating but is improved after his vomits. Patient found to be very dehydrated on admission. He underwent partial small bowel resection and repair of incarcerated right inguinal hernia on 9/18 and his bowel function has been slow to return. He's received several suppositories today pt says but no results as yet. He still has an NGT in place and output is around 200 ml so far this shift.  Given his length of stay and NPO/Clear Liquids status now day-12;  he meets criteria for severe malnutrition in the context of acute illness. His energy/protein intake </= 50% for >/= 5 days and moderate muscle depletions.  . Recent Labs Lab 09/24/15 0548  09/28/15 0427 09/29/15 0839 09/30/15 0428  NA 140  < > 139 140 139  K 4.1  < > 5.5* 4.3 3.7  CL 102  < > 111 109 104  CO2 29  < > 24 27 29   BUN 52*  < > 22* 23* 17  CREATININE 2.67*  < > 2.71* 2.44* 2.02*  CALCIUM 7.2*  < > 7.4* 7.1* 7.3*  MG 2.0  --  1.3* 1.3*  --   PHOS  --   --  4.2  --   --   GLUCOSE 147*  < > 104* 154* 147*  < > = values in this interval not displayed.   Diet Order:   NPO  Skin:   2 surgical incisions   Last BM:  9/3 (pt to  receive suppositories per MD note  Height:   Ht Readings from Last 1 Encounters:  09/27/15 6\' 2"  (1.88 m)    Weight:   Wt Readings from Last 1 Encounters:  09/30/15 255 lb 1.2 oz (115.7 kg)    Ideal Body Weight:  86 kg  BMI:  Body mass index is 32.75 kg/m.  Estimated Nutritional Needs:   Kcal:  1332- 9150   Protein:  76-86 gr  Fluid:  >1.6 liters daily  EDUCATION NEEDS:   Education needs no appropriate at this time  Colman Cater MS,RD,CSG,LDN Office: [phone] Pager: [phone]

## 2015-10-01 DIAGNOSIS — E43 Unspecified severe protein-calorie malnutrition: Secondary | ICD-10-CM | POA: Insufficient documentation

## 2015-10-01 DIAGNOSIS — E039 Hypothyroidism, unspecified: Secondary | ICD-10-CM | POA: Diagnosis not present

## 2015-10-01 DIAGNOSIS — I482 Chronic atrial fibrillation: Secondary | ICD-10-CM | POA: Diagnosis not present

## 2015-10-01 DIAGNOSIS — I10 Essential (primary) hypertension: Secondary | ICD-10-CM | POA: Diagnosis not present

## 2015-10-01 DIAGNOSIS — E876 Hypokalemia: Secondary | ICD-10-CM | POA: Diagnosis not present

## 2015-10-01 DIAGNOSIS — E860 Dehydration: Secondary | ICD-10-CM | POA: Diagnosis not present

## 2015-10-01 DIAGNOSIS — N179 Acute kidney failure, unspecified: Secondary | ICD-10-CM | POA: Diagnosis not present

## 2015-10-01 DIAGNOSIS — N183 Chronic kidney disease, stage 3 (moderate): Secondary | ICD-10-CM | POA: Diagnosis not present

## 2015-10-01 LAB — BASIC METABOLIC PANEL
ANION GAP: 7 (ref 5–15)
BUN: 14 mg/dL (ref 6–20)
CO2: 27 mmol/L (ref 22–32)
Calcium: 7.4 mg/dL — ABNORMAL LOW (ref 8.9–10.3)
Chloride: 104 mmol/L (ref 101–111)
Creatinine, Ser: 1.72 mg/dL — ABNORMAL HIGH (ref 0.61–1.24)
GFR, EST AFRICAN AMERICAN: 43 mL/min — AB (ref >60)
GFR, EST NON AFRICAN AMERICAN: 37 mL/min — AB (ref >60)
GLUCOSE: 95 mg/dL (ref 65–99)
POTASSIUM: 3.9 mmol/L (ref 3.5–5.1)
Sodium: 138 mmol/L (ref 135–145)

## 2015-10-01 LAB — TYPE AND SCREEN
ABO/RH(D): A POS
ANTIBODY SCREEN: NEGATIVE
UNIT DIVISION: 0
Unit division: 0

## 2015-10-01 LAB — CBC
HCT: 31.5 % — ABNORMAL LOW (ref 39.0–52.0)
HEMOGLOBIN: 10.1 g/dL — AB (ref 13.0–17.0)
MCH: 31.4 pg (ref 26.0–34.0)
MCHC: 32.1 g/dL (ref 30.0–36.0)
MCV: 97.8 fL (ref 78.0–100.0)
Platelets: 355 10*3/uL (ref 150–400)
RBC: 3.22 MIL/uL — AB (ref 4.22–5.81)
RDW: 12.9 % (ref 11.5–15.5)
WBC: 11.7 10*3/uL — ABNORMAL HIGH (ref 4.0–10.5)

## 2015-10-01 LAB — PROTIME-INR
INR: 1.59
Prothrombin Time: 19.1 seconds — ABNORMAL HIGH (ref 11.4–15.2)

## 2015-10-01 LAB — GLUCOSE, CAPILLARY: GLUCOSE-CAPILLARY: 126 mg/dL — AB (ref 65–99)

## 2015-10-01 LAB — MAGNESIUM: Magnesium: 1.6 mg/dL — ABNORMAL LOW (ref 1.7–2.4)

## 2015-10-01 LAB — HEPARIN LEVEL (UNFRACTIONATED)
Heparin Unfractionated: 0.21 IU/mL — ABNORMAL LOW (ref 0.30–0.70)
Heparin Unfractionated: 0.39 IU/mL (ref 0.30–0.70)

## 2015-10-01 MED ORDER — FUROSEMIDE 10 MG/ML IJ SOLN
10.0000 mg | Freq: Every day | INTRAMUSCULAR | Status: DC
  Administered 2015-10-01: 10 mg via INTRAVENOUS
  Filled 2015-10-01: qty 2

## 2015-10-01 MED ORDER — MAGNESIUM SULFATE 2 GM/50ML IV SOLN
2.0000 g | Freq: Once | INTRAVENOUS | Status: AC
  Administered 2015-10-01: 2 g via INTRAVENOUS
  Filled 2015-10-01: qty 50

## 2015-10-01 MED ORDER — CARVEDILOL 12.5 MG PO TABS
25.0000 mg | ORAL_TABLET | Freq: Two times a day (BID) | ORAL | Status: DC
  Administered 2015-10-01 – 2015-10-03 (×4): 25 mg via ORAL
  Filled 2015-10-01 (×4): qty 2

## 2015-10-01 NOTE — Progress Notes (Signed)
4 Days Post-Op  Subjective: Patient alert. Had multiple episodes of flatus over past 24 hours. Denies any abdominal pain.  Objective: Vital signs in last 24 hours: Temp:  [97.3 F (36.3 C)-98.6 F (37 C)] 98.2 F (36.8 C) (09/22 0800) Pulse Rate:  [75-97] 75 (09/22 0930) Resp:  [15-25] 16 (09/22 0930) BP: (129-150)/(78-95) 150/95 (09/22 0930) SpO2:  [99 %-100 %] 100 % (09/22 0930) Weight:  [117.6 kg (259 lb 4.2 oz)] 117.6 kg (259 lb 4.2 oz) (09/22 0500) Last BM Date: 09/12/15  Intake/Output from previous day: 09/21 0701 - 09/22 0700 In: 664.9 [I.V.:614.9; IV Piggyback:50] Out: 2150 [Urine:1850; Emesis/NG output:300] Intake/Output this shift: Total I/O In: 277.9 [I.V.:277.9] Out: -   General appearance: alert, cooperative and no distress GI: Soft, active bowel sounds appreciated. Incisions healing well.  Lab Results:   Recent Labs  09/30/15 0428 10/01/15 0349  WBC 13.8* 11.7*  HGB 10.3* 10.1*  HCT 32.7* 31.5*  PLT 338 355   BMET  Recent Labs  09/30/15 0428 10/01/15 0349  NA 139 138  K 3.7 3.9  CL 104 104  CO2 29 27  GLUCOSE 147* 95  BUN 17 14  CREATININE 2.02* 1.72*  CALCIUM 7.3* 7.4*   PT/INR  Recent Labs  09/30/15 0428 10/01/15 0349  LABPROT 17.7* 19.1*  INR 1.45 1.59    Studies/Results: No results found.  Anti-infectives: Anti-infectives    Start     Dose/Rate Route Frequency Ordered Stop   09/30/15 2000  vancomycin (VANCOCIN) 1,250 mg in sodium chloride 0.9 % 250 mL IVPB  Status:  Discontinued     1,250 mg 166.7 mL/hr over 90 Minutes Intravenous Every 24 hours 09/30/15 1218 10/01/15 0927   09/28/15 1500  vancomycin (VANCOCIN) 1,250 mg in sodium chloride 0.9 % 250 mL IVPB  Status:  Discontinued     1,250 mg 166.7 mL/hr over 90 Minutes Intravenous Every 24 hours 09/27/15 1437 09/30/15 1218   09/27/15 1445  piperacillin-tazobactam (ZOSYN) IVPB 3.375 g     3.375 g 12.5 mL/hr over 240 Minutes Intravenous Every 8 hours 09/27/15 1443     09/27/15 1445  vancomycin (VANCOCIN) 2,000 mg in sodium chloride 0.9 % 500 mL IVPB     2,000 mg 250 mL/hr over 120 Minutes Intravenous  Once 09/27/15 1434 09/27/15 1713   09/26/15 0915  ciprofloxacin (CIPRO) IVPB 400 mg  Status:  Discontinued     400 mg 200 mL/hr over 60 Minutes Intravenous Every 12 hours 09/26/15 0914 09/27/15 1426   09/25/15 1030  metroNIDAZOLE (FLAGYL) IVPB 500 mg  Status:  Discontinued     500 mg 100 mL/hr over 60 Minutes Intravenous Every 8 hours 09/25/15 0934 09/27/15 1426   09/19/15 0400  cefTRIAXone (ROCEPHIN) 1 g in dextrose 5 % 50 mL IVPB     1 g 100 mL/hr over 30 Minutes Intravenous Every 24 hours 09/18/15 0522 09/23/15 0441   09/18/15 0415  cefTRIAXone (ROCEPHIN) 1 g in dextrose 5 % 50 mL IVPB     1 g 100 mL/hr over 30 Minutes Intravenous  Once 09/18/15 0406 09/18/15 0457      Assessment/Plan: s/p Procedure(s): RIGHT INGUINAL HERNIORRHAPY WITH MESH SMALL BOWEL RESECTION Impression: Bowel function has returned. We'll remove gastric tube. Start clear liquid diet. Will advance as tolerated.  LOS: 13 days    Floy Angert A 10/01/2015

## 2015-10-01 NOTE — Progress Notes (Signed)
PROGRESS NOTE    [name] RETZ  IBB:[id number] DOB: [birth year] DOA: 09/18/2015 PCP: [name], MD    Brief Narrative:  76 yom with a hx of afib on coumadin, CKD, HTN, hypothyroidism, and cardiomyopathy presented with complaints of nausea and vomiting. Patient had a syncopal episode that lasted a few seconds while attempting to stand. While in the ED, he was noted to be mildly tachycardic. He had an elevated BUN 95 and creatinine 6.53. He was found to have a strangulated right inguinal hernia and SBO. He had a small bowel resection and repair of the right inguinal hernia on 09/27/15. Will continue to manage.   Assessment & Plan:   Principal Problem:   AKI (acute kidney injury) ([place]) Active Problems:   Atrial fibrillation ([place])   Long term current use of anticoagulant therapy   Essential hypertension   CKD (chronic kidney disease) stage 3, GFR 30-59 ml/min   Orthostatic syncope   Vomiting   Dehydration   Cardiomyopathy ([facility])   Hypokalemia   Warfarin-induced coagulopathy ([facility])   Constipation   Hypothyroidism   SBO (small bowel obstruction) ([place])   Incarcerated right inguinal hernia  1. Incarcerated-strangulated right inguinal hernia with SBO. CT of abdomen/pelvis revealed high-grade bowel obstruction due to incarcerated right inguinal hernia. An NG tube was placed. Surgery was consulted and preformed partial SBO resection and repair of the right inguinal hernia on 9/18. He has not had any recovery of bowel function yet. He was started on suppositories without significant bowel movement. Continue supportive management.  2. Low-grade fever. Patient developed a low-grade fever on 9/15. His WBC count 11,700, improving.Repeat UA was unremarkable. Blood cultures remain negative. Discontinue Vancomycin. Continue IV Zosyn for now, while patient is NPO. 3. Hyperkalemia. Resolved. 4. HTN. Stable.  5. AKI superimposed on CKD stage lll. Creatinine was 6.53 on admission.  Hiscreatinine has improved to 1.7, which is his baseline. Monitor intake and output. Continue to hold ARB. Restart lasix. 6. Chronic afib with RVR. He was anticoagulated with coumadin and coreg on admission. His INR was supratherapeutic at 4.88 on admission. Coreg was held and he is currently on IV lopressor. Will start the patient on IV heparin per pharmacy without bolus. Will likely transition to NOAC on discharge.  7. Hypothyroidism. TSH is within normal limits. Will continue IV synthroid.   DVT prophylaxis: SCDs  Code Status: Full  Family Communication: discussed with family at bedside Disposition Plan: Discharge home once improved.    Consultants:   General surgery   Procedures:   Partial small bowel resection and repair of incarcerated right inguinal hernia 09/27/2015  Antimicrobials:   Vancomycin 9/18>>9/22  Zosyn 9/18>>  Cipro 9/17>>9/18  Flagyl 09/25/15>>9/18  Rocephin >> 09/25/15    Subjective: Reports passing flatus overnight. Has not had a bowel movement. No vomiting. Reports some stomach cramps. No shortness of breath  Objective: Vitals:   09/30/15 2000 10/01/15 0000 10/01/15 0400 10/01/15 0500  BP:      Pulse:      Resp:      Temp: 98.6 F (37 C) 97.3 F (36.3 C) 97.6 F (36.4 C)   TempSrc: Oral Axillary Oral   SpO2:      Weight:    117.6 kg (259 lb 4.2 oz)  Height:        Intake/Output Summary (Last 24 hours) at 10/01/15 4503 Last data filed at 10/01/15 0430  Gross per 24 hour  Intake  664.9 ml  Output             2150 ml  Net          -1485.1 ml   Filed Weights   09/29/15 0500 09/30/15 0500 10/01/15 0500  Weight: 116.3 kg (256 lb 6.3 oz) 115.7 kg (255 lb 1.2 oz) 117.6 kg (259 lb 4.2 oz)    Examination:  General exam: Appears calm and comfortable  Respiratory system: Clear to auscultation. Respiratory effort normal. Cardiovascular system: S1 & S2 heard, RRR. No JVD, murmurs, rubs, gallops or clicks. No pedal  edema. Gastrointestinal system: Abdomen is nondistended, soft and nontender. No organomegaly or masses felt. Sluggish bowel sounds. Central nervous system: Alert and oriented. No focal neurological deficits. Extremities: Symmetric 5 x 5 power. Skin: No rashes, lesions or ulcers Psychiatry: Judgement and insight appear normal. Mood & affect appropriate.     Data Reviewed: I have personally reviewed following labs and imaging studies  CBC:  Recent Labs Lab 09/27/15 0435 09/28/15 0427 09/29/15 0839 09/30/15 0428 10/01/15 0349  WBC 13.0* 20.3* 17.2* 13.8* 11.7*  NEUTROABS 9.3*  --   --   --   --   HGB 10.8* 11.2* 10.3* 10.3* 10.1*  HCT 34.2* 35.6* 32.7* 32.7* 31.5*  MCV 101.8* 101.1* 100.0 99.7 97.8  PLT 337 345 310 338 161   Basic Metabolic Panel:  Recent Labs Lab 09/27/15 0435 09/28/15 0427 09/29/15 0839 09/30/15 0428 10/01/15 0349  NA 142 139 140 139 138  K 4.5 5.5* 4.3 3.7 3.9  CL 112* 111 109 104 104  CO2 25 24 27 29 27   GLUCOSE 111* 104* 154* 147* 95  BUN 15 22* 23* 17 14  CREATININE 1.75* 2.71* 2.44* 2.02* 1.72*  CALCIUM 7.8* 7.4* 7.1* 7.3* 7.4*  MG  --  1.3* 1.3*  --   --   PHOS  --  4.2  --   --   --    GFR: Estimated Creatinine Clearance: 50.6 mL/min (by C-G formula based on SCr of 1.72 mg/dL (H)). Liver Function Tests: No results for input(s): AST, ALT, ALKPHOS, BILITOT, PROT, ALBUMIN in the last 168 hours. No results for input(s): LIPASE, AMYLASE in the last 168 hours. No results for input(s): AMMONIA in the last 168 hours. Coagulation Profile:  Recent Labs Lab 09/26/15 0400 09/27/15 0435 09/28/15 0427 09/29/15 0502 09/30/15 0428  INR 1.34 1.31 1.37 1.69 1.45   Cardiac Enzymes: No results for input(s): CKTOTAL, CKMB, CKMBINDEX, TROPONINI in the last 168 hours. BNP (last 3 results) No results for input(s): PROBNP in the last 8760 hours. HbA1C: No results for input(s): HGBA1C in the last 72 hours. CBG:  Recent Labs Lab 09/28/15 2143   GLUCAP 113*   Lipid Profile: No results for input(s): CHOL, HDL, LDLCALC, TRIG, CHOLHDL, LDLDIRECT in the last 72 hours. Thyroid Function Tests: No results for input(s): TSH, T4TOTAL, FREET4, T3FREE, THYROIDAB in the last 72 hours. Anemia Panel: No results for input(s): VITAMINB12, FOLATE, FERRITIN, TIBC, IRON, RETICCTPCT in the last 72 hours. Sepsis Labs: No results for input(s): PROCALCITON, LATICACIDVEN in the last 168 hours.  Recent Results (from the past 240 hour(s))  Urine culture     Status: None   Collection Time: 09/21/15 10:51 AM  Result Value Ref Range Status   Specimen Description URINE, CLEAN CATCH  Final   Special Requests NONE  Final   Culture NO GROWTH Performed at [facility]   Final   Report Status 09/23/2015 FINAL  Final  MRSA PCR Screening     Status: None   Collection Time: 09/24/15  2:45 PM  Result Value Ref Range Status   MRSA by PCR NEGATIVE NEGATIVE Final    Comment:        The GeneXpert MRSA Assay (FDA approved for NASAL specimens only), is one component of a comprehensive MRSA colonization surveillance program. It is not intended to diagnose MRSA infection nor to guide or monitor treatment for MRSA infections.   Culture, blood (Routine X 2) w Reflex to ID Panel     Status: None   Collection Time: 09/24/15  5:37 PM  Result Value Ref Range Status   Specimen Description RIGHT ANTECUBITAL  Final   Special Requests BOTTLES DRAWN AEROBIC AND ANAEROBIC [place]  Final   Culture NO GROWTH 5 DAYS  Final   Report Status 09/29/2015 FINAL  Final  Culture, blood (Routine X 2) w Reflex to ID Panel     Status: None   Collection Time: 09/24/15  7:05 PM  Result Value Ref Range Status   Specimen Description BLOOD RIGHT HAND  Final   Special Requests BOTTLES DRAWN AEROBIC AND ANAEROBIC [place]  Final   Culture NO GROWTH 5 DAYS  Final   Report Status 09/29/2015 FINAL  Final         Radiology Studies: No results found.      Scheduled  Meds: . bisacodyl  10 mg Rectal BID  . chlorhexidine  15 mL Mouth Rinse BID  . famotidine (PEPCID) IV  20 mg Intravenous Q24H  . levothyroxine  25 mcg Intravenous Daily  . mouth rinse  15 mL Mouth Rinse q12n4p  . metoprolol  5 mg Intravenous Q8H  . piperacillin-tazobactam (ZOSYN)  IV  3.375 g Intravenous Q8H  . vancomycin  1,250 mg Intravenous Q24H   Continuous Infusions: . diltiazem (CARDIZEM) infusion 5 mg/hr (10/01/15 0536)  . heparin 1,850 Units/hr (10/01/15 0104)  . lactated ringers 100 mL/hr at 10/01/15 0244     LOS: 13 days    Time spent: 25 minutes     Kathie Dike, MD [facility] If 7PM-7AM, please contact night-coverage [email or web address] Password [facility] 10/01/2015, 6:14 AM

## 2015-10-01 NOTE — Progress Notes (Signed)
ANTICOAGULATION CONSULT NOTE  Pharmacy Consult for Heparin Indication: atrial fibrillation  No Known Allergies  Patient Measurements: Height: 6\' 2"  (188 cm) Weight: 259 lb 4.2 oz (117.6 kg) IBW/kg (Calculated) : 82.2  HEPARIN DW (KG): 105.4  Vital Signs: Temp: 98.2 F (36.8 C) (09/22 0800) Temp Source: Oral (09/22 0800)  Labs:  Recent Labs  09/29/15 0502  09/29/15 0839 09/30/15 0428 09/30/15 1619 10/01/15 0349  HGB  --   < > 10.3* 10.3*  --  10.1*  HCT  --   --  32.7* 32.7*  --  31.5*  PLT  --   --  310 338  --  355  LABPROT 20.1*  --   --  17.7*  --  19.1*  INR 1.69  --   --  1.45  --  1.59  HEPARINUNFRC  --   --   --   --  <0.10* 0.21*  CREATININE  --   --  2.44* 2.02*  --  1.72*  < > = values in this interval not displayed. Estimated Creatinine Clearance: 50.6 mL/min (by C-G formula based on SCr of 1.72 mg/dL (H)).  Medical History: Past Medical History:  Diagnosis Date  . AF (atrial fibrillation) ([place])   . Cardiomyopathy ([place])    h/o  . Chronic kidney disease, stage 3   . Dysrhythmia   . Hypothyroidism   . Pneumonia   . Prostate cancer ([place])   . Shortness of breath   . Systemic hypertension    Medications:  Prescriptions Prior to Admission  Medication Sig Dispense Refill Last Dose  . Calcium Carbonate-Vitamin D (CALCIUM 600+D) 600-400 MG-UNIT tablet Take 1 tablet by mouth 2 (two) times daily.   09/17/2015 at Unknown time  . carvedilol (COREG) 25 MG tablet Take 37.5 mg by mouth 2 (two) times daily with a meal.   09/17/2015 at 1900  . furosemide (LASIX) 20 MG tablet Take 1 tablet (20 mg total) by mouth daily. (Patient taking differently: Take 20 mg by mouth every other day. ) 30 tablet 0 Past Week at Unknown time  . levothyroxine (SYNTHROID, LEVOTHROID) 50 MCG tablet Take 1 tablet (50 mcg total) by mouth every morning. 30 tablet 6 09/17/2015 at Unknown time  . losartan (COZAAR) 25 MG tablet Take 25 mg by mouth daily.   09/17/2015 at Unknown time  . Vitamin D,  Ergocalciferol, (DRISDOL) 50000 units CAPS capsule Take 50,000 Units by mouth every 30 (thirty) days.   Past Month at Unknown time  . acetaminophen (TYLENOL) 500 MG tablet Take 1,000 mg by mouth every 6 (six) hours as needed for mild pain, moderate pain or headache.   Unknown at Unknown time  . warfarin (COUMADIN) 4 MG tablet Take 4 mg by mouth every evening.   Unknown at Unknown time   Assessment: Heparin drip was on hold post op, asked to resume today with no bolus per MD.  Heparin drip started later than initial order.  Heparin level from this after noon roughly 4 hours after initiation.  Previously slightly below therapeutic level at 1700 units./hr.  No bleeding noted.  Goal of Therapy:  Monitor platelets by anticoagulation protocol: Yes  Heparin level 0.3-0.7 units/ml   Plan:  Increase heparin drip to 2050 units/hr Check HL in ~ 8 hrs Daily HL and CBC while on heparin Monitor for bleeding coomplications  Isac Sarna, BS [place], BCPS Clinical Pharmacist Pager [phone]  10/01/2015  8:12 AM

## 2015-10-01 NOTE — Evaluation (Signed)
Physical Therapy Re-Evaluation Patient Details Name: [name] [name] MRN: [id number] DOB: 10-07-1940 Today's Date: 10/01/2015   History of Present Illness  75 y.o. male with medical history significant of HTN, CKD baseline cr around 2, afib on coumadin comes in for the second time this week for persistent vomiting.  He came in 2 days ago with cc of vomiting at that time.  He was told to stop taking some of his pills but is not sure which ones.  He went home and continued to vomit.  The vomiting comes right after he experiences suprapubic pain and disfomfort.  He denies any dysuria, no urinaary incontinence.  No hematuria.  No pain above the suprapubic area.  No diarrhea. No constipation.  The pain is not related with eating but is improved after his vomits.  He has had no sick contacts.  Today was very weak and his sons went to check on him and he was too weak to even stand.  They tried to get him up and he passed out when he stood up.  They finally got him into the car to come to the ED.  Pt found to be very dehydrated with worsening renal failure and was referred for admission for such.  09/22/15 CT scan of the abdomen/pelvis was ordered for evaluation of persistent abdominal distention and vomiting. It revealed a high-grade bowel obstruction due to incarcerated right inguinal hernia. His lactic acid level was within normal limits.  NG tube was inserted and it drained a copious amount of green bilious fluid.  Dr. Arnoldo Morale was consulted. He did not believe the patient had evidence of strangulation or gangrenous changes on exam.  Pt is now s/p R inguinal hernorrhapy with mesh.    Clinical Impression  Pt received in bed, and was agreeable to PT re-evaluation.  Pt was able to transfer from the bed<>chair with RW and min/mod A.  Pt normally lives alone, therefore, he would benefit from SNF to progress his strength, balance, and functional mobility before he goes home.      Follow Up Recommendations SNF     Equipment Recommendations  Other (comment) (TBD at next venue of care. )    Recommendations for Other Services       Precautions / Restrictions Precautions Precautions: Fall Precaution Comments: fell 3x's just prior to admission. 1) in the kitchen, 2) oldest son spent the night, and pt had fell off the bed and couldn't get up.  3) The next night, both boys were helping him, and he passed out.   Restrictions Weight Bearing Restrictions: No      Mobility  Bed Mobility Overal bed mobility: Needs Assistance Bed Mobility: Rolling;Sidelying to Sit Rolling: Min guard Sidelying to sit: Min guard;HOB elevated          Transfers Overall transfer level: Needs assistance Equipment used: Rolling walker (2 wheeled) Transfers: Sit to/from [facility] Sit to Stand: Min assist;Mod assist;From elevated surface Stand pivot transfers: Min assist          Ambulation/Gait                Stairs            Wheelchair Mobility    Modified Rankin (Stroke Patients Only)       Balance Overall balance assessment: Needs assistance Sitting-balance support: Bilateral upper extremity supported Sitting balance-Leahy Scale: Fair     Standing balance support: Bilateral upper extremity supported Standing balance-Leahy Scale: Good  Pertinent Vitals/Pain Pain Assessment: No/denies pain    Home Living   Living Arrangements: Alone Available Help at Discharge: Family (son comes by any time he is needed. ) Type of Home: Mobile home Home Access: Stairs to enter Entrance Stairs-Rails: Can reach both Entrance Stairs-Number of Steps: 4 Home Layout: One level Home Equipment: None      Prior Function Level of Independence: Independent         Comments: Still driving, and is normally a Hydrographic surveyor.       Hand Dominance   Dominant Hand: Left    Extremity/Trunk Assessment   Upper Extremity Assessment:  Generalized weakness           Lower Extremity Assessment: Generalized weakness         Communication   Communication: No difficulties  Cognition Arousal/Alertness: Awake/alert Behavior During Therapy: WFL for tasks assessed/performed Overall Cognitive Status: Within Functional Limits for tasks assessed                      General Comments      Exercises     Assessment/Plan    PT Assessment Patient needs continued PT services  PT Problem List Decreased strength;Decreased activity tolerance;Decreased balance;Decreased mobility;Decreased knowledge of use of DME;Decreased knowledge of precautions;Obesity;Decreased safety awareness          PT Treatment Interventions DME instruction;Gait training;Stair training;Functional mobility training;Therapeutic activities;Therapeutic exercise;Balance training;Patient/family education    PT Goals (Current goals can be found in the Care Plan section)  Acute Rehab PT Goals Patient Stated Goal: Pt wants to get stronger, and wants the pain to go away.  PT Goal Formulation: With patient Time For Goal Achievement: 10/08/15 Potential to Achieve Goals: Good    Frequency Min 4X/week   Barriers to discharge Decreased caregiver support Pt lives alone.     Co-evaluation               End of Session Equipment Utilized During Treatment: Gait belt Activity Tolerance: Patient limited by fatigue Patient left: in chair;with call bell/phone within reach;with family/visitor present Nurse Communication:  (Mysti, RN notified of pt's location and mobility level. )    Functional Assessment Tool Used: [employer] "6-clicks"  Functional Limitation: Mobility: Walking and moving around Mobility: Walking and Moving Around Current Status [phone]): At least 40 percent but less than 60 percent impaired, limited or restricted Mobility: Walking and Moving Around Goal Status [phone]): At least 20 percent but less than 40 percent  impaired, limited or restricted    Time: 1434-1501 PT Time Calculation (min) (ACUTE ONLY): 27 min   Charges:   PT Evaluation $PT Re-evaluation: 1 Procedure PT Treatments $Therapeutic Activity: 8-22 mins   PT G Codes:   PT G-Codes **NOT FOR INPATIENT CLASS** Functional Assessment Tool Used: [employer] "6-clicks"  Functional Limitation: Mobility: Walking and moving around Mobility: Walking and Moving Around Current Status [phone]): At least 40 percent but less than 60 percent impaired, limited or restricted Mobility: Walking and Moving Around Goal Status [phone]): At least 20 percent but less than 40 percent impaired, limited or restricted    Beth Tamula Morrical, PT, DPT X: [phone]

## 2015-10-01 NOTE — Care Management Note (Signed)
Case Management Note  Patient Details  Name: [name] [name] MRN: [id number] Date of Birth: 02-21-1940   Expected Discharge Date:    10/09/2015              Expected Discharge Plan:  [place]  In-House Referral:  Clinical Social Work  Discharge planning Services  CM Consult  Post Acute Care Choice:  NA Choice offered to:  NA  DME Arranged:    DME Agency:     HH Arranged:    [place] Agency:     Status of Service:  In process, will continue to follow  If discussed at Long Length of Stay Meetings, dates discussed:    Additional Comments: Anticipate pt will need SNF at DC due to long period of immobility following surgery. Pt is more appropriate for PT today and he will be evaluated. CSW is aware of potential for SNF. DC over weekend is not anticipated per MD..   [name], Margretta Sidle, RN 10/01/2015, 10:39 AM

## 2015-10-01 NOTE — Care Management Important Message (Signed)
Important Message  Patient Details  Name: [name] [name] MRN: [id number] Date of Birth: 1940/12/12   Medicare Important Message Given:  Yes    Sherald Barge, RN 10/01/2015, 10:41 AM

## 2015-10-02 DIAGNOSIS — I482 Chronic atrial fibrillation: Secondary | ICD-10-CM | POA: Diagnosis not present

## 2015-10-02 DIAGNOSIS — N183 Chronic kidney disease, stage 3 (moderate): Secondary | ICD-10-CM | POA: Diagnosis not present

## 2015-10-02 DIAGNOSIS — E860 Dehydration: Secondary | ICD-10-CM | POA: Diagnosis not present

## 2015-10-02 DIAGNOSIS — I10 Essential (primary) hypertension: Secondary | ICD-10-CM | POA: Diagnosis not present

## 2015-10-02 DIAGNOSIS — E876 Hypokalemia: Secondary | ICD-10-CM | POA: Diagnosis not present

## 2015-10-02 DIAGNOSIS — N179 Acute kidney failure, unspecified: Secondary | ICD-10-CM | POA: Diagnosis not present

## 2015-10-02 DIAGNOSIS — E039 Hypothyroidism, unspecified: Secondary | ICD-10-CM | POA: Diagnosis not present

## 2015-10-02 LAB — CBC
HCT: 29.9 % — ABNORMAL LOW (ref 39.0–52.0)
Hemoglobin: 9.7 g/dL — ABNORMAL LOW (ref 13.0–17.0)
MCH: 31.5 pg (ref 26.0–34.0)
MCHC: 32.4 g/dL (ref 30.0–36.0)
MCV: 97.1 fL (ref 78.0–100.0)
PLATELETS: 348 10*3/uL (ref 150–400)
RBC: 3.08 MIL/uL — AB (ref 4.22–5.81)
RDW: 13 % (ref 11.5–15.5)
WBC: 9.1 10*3/uL (ref 4.0–10.5)

## 2015-10-02 LAB — BASIC METABOLIC PANEL
ANION GAP: 7 (ref 5–15)
BUN: 13 mg/dL (ref 6–20)
CALCIUM: 7.3 mg/dL — AB (ref 8.9–10.3)
CO2: 27 mmol/L (ref 22–32)
Chloride: 102 mmol/L (ref 101–111)
Creatinine, Ser: 1.67 mg/dL — ABNORMAL HIGH (ref 0.61–1.24)
GFR calc Af Amer: 45 mL/min — ABNORMAL LOW (ref >60)
GFR, EST NON AFRICAN AMERICAN: 38 mL/min — AB (ref >60)
Glucose, Bld: 106 mg/dL — ABNORMAL HIGH (ref 65–99)
POTASSIUM: 3.4 mmol/L — AB (ref 3.5–5.1)
SODIUM: 136 mmol/L (ref 135–145)

## 2015-10-02 LAB — GLUCOSE, CAPILLARY: GLUCOSE-CAPILLARY: 126 mg/dL — AB (ref 65–99)

## 2015-10-02 LAB — HEPARIN LEVEL (UNFRACTIONATED): Heparin Unfractionated: 0.51 IU/mL (ref 0.30–0.70)

## 2015-10-02 LAB — PROTIME-INR
INR: 1.7
PROTHROMBIN TIME: 20.2 s — AB (ref 11.4–15.2)

## 2015-10-02 MED ORDER — LEVOTHYROXINE SODIUM 50 MCG PO TABS
50.0000 ug | ORAL_TABLET | Freq: Every day | ORAL | Status: DC
  Administered 2015-10-03 – 2015-10-06 (×4): 50 ug via ORAL
  Filled 2015-10-02 (×4): qty 1

## 2015-10-02 MED ORDER — POTASSIUM CHLORIDE CRYS ER 20 MEQ PO TBCR
40.0000 meq | EXTENDED_RELEASE_TABLET | ORAL | Status: AC
  Administered 2015-10-02 (×2): 40 meq via ORAL
  Filled 2015-10-02 (×2): qty 2

## 2015-10-02 MED ORDER — FUROSEMIDE 10 MG/ML IJ SOLN
40.0000 mg | Freq: Two times a day (BID) | INTRAMUSCULAR | Status: DC
Start: 2015-10-02 — End: 2015-10-06
  Administered 2015-10-02 – 2015-10-06 (×9): 40 mg via INTRAVENOUS
  Filled 2015-10-02 (×8): qty 4

## 2015-10-02 MED ORDER — RIVAROXABAN 20 MG PO TABS
20.0000 mg | ORAL_TABLET | Freq: Every day | ORAL | Status: DC
Start: 2015-10-02 — End: 2015-10-04
  Administered 2015-10-02 – 2015-10-03 (×2): 20 mg via ORAL
  Filled 2015-10-02 (×3): qty 1

## 2015-10-02 NOTE — Progress Notes (Signed)
PROGRESS NOTE    [name] [name]  ZSW:[id number] DOB: 11-06-1940 DOA: 09/18/2015 PCP: [name], MD    Brief Narrative:  69 yom with a hx of afib on coumadin, CKD, HTN, hypothyroidism, and cardiomyopathy presented with complaints of nausea and vomiting. Patient had a syncopal episode that lasted a few seconds while attempting to stand. While in the ED, he was noted to be mildly tachycardic. He had an elevated BUN 95 and creatinine 6.53. He was found to have a strangulated right inguinal hernia and SBO. He had a small bowel resection and repair of the right inguinal hernia on 09/27/15. Will continue to manage.   Assessment & Plan:   Principal Problem:   AKI (acute kidney injury) ([place]) Active Problems:   Atrial fibrillation ([place])   Long term current use of anticoagulant therapy   Essential hypertension   CKD (chronic kidney disease) stage 3, GFR 30-59 ml/min   Orthostatic syncope   Vomiting   Dehydration   Cardiomyopathy (HCC)   Hypokalemia   Warfarin-induced coagulopathy (HCC)   Constipation   Hypothyroidism   SBO (small bowel obstruction) ([place])   Incarcerated right inguinal hernia   Protein-calorie malnutrition, severe  1. Incarcerated-strangulated right inguinal hernia withSBO. CT of abdomen/pelvis revealed high-grade bowel obstruction due to incarcerated right inguinal hernia. Surgery was consulted and preformed partial SBO resection and repair of the right inguinal hernia on 9/18. Bowel function has returned. NG removed. He is tolerating a liquid diet and reports having some bowel movements. Continue supportive management. 2. Low-grade fever. Patient developed a low-grade fever on 9/15. His WBC count 9,100, improving.Repeat UA was unremarkable. Blood cultures remain negative. Discontinue Zosyn. 3. HTN.  Blood pressures stable. Continue to monitor. 4. AKI superimposed on CKD stage lll. Creatinine was 6.53 on admission. Hiscreatinine has improved to 1.67, which is  his baseline. Monitor intake and output. Continue to hold ARB.  5. Chronic afib with RVR. He was anticoagulated with coumadin and coreg on admission. His INR was supratherapeutic at 4.88 on admission. Coreg was initially held and he was started on IV lopressor and cardizem infusion. He was started on IV heparin per pharmacy without bolus. Bowel issues have improved, so he has been weaned off cardizem/lopressor. He has been restarted on coreg with stable heart rate. Will transition heparin infusion to Xarelto.  6. Hypothyroidism. TSH is within normal limits. Restart oral dose of synthroid 7. Hypocalcemia. Calcium 7.3. Check albumin. Corrected level may be in normal range. 8. Anemia. Hemoglobin 9.7.no obvious signs of bleeding Continue to monitor. 9. Scrotal edema. Likely related to volume resuscitation and low albumin. Start on IV lasix    DVT prophylaxis: Xarelto Code Status: Full Family Communication: No family bedside  Disposition Plan: Discharge to SNF when stable  Consultants:   General surgery  Procedures:   Partial small bowel resection and repair of incarcerated right inguinal hernia 09/27/2015  Antimicrobials:   Vancomycin 9/18 >> 9/22  Zosyn 9/18 >>   Cipro 9/17 >> 9/18  Flagyl 9/16 >> 9/18  Rocephin >> 9/16   Subjective: Patient reports having some bowel movements overnight and passing flatus. No vomiting. No shortness of breath. He has developed scrotal edema.  Objective: Vitals:   10/02/15 0400 10/02/15 0448 10/02/15 0505 10/02/15 0600  BP: 135/83  132/81 (!) 147/94  Pulse: 92  95 84  Resp: (!) 24  12 (!) 21  Temp:      TempSrc:      SpO2: 98%  100% 98%  Weight:  117 kg (257 lb 15 oz)    Height:        Intake/Output Summary (Last 24 hours) at 10/02/15 0730 Last data filed at 10/02/15 0600  Gross per 24 hour  Intake          4626.89 ml  Output             3201 ml  Net          1425.89 ml   Filed Weights   09/30/15 0500 10/01/15 0500 10/02/15 0448    Weight: 115.7 kg (255 lb 1.2 oz) 117.6 kg (259 lb 4.2 oz) 117 kg (257 lb 15 oz)    Examination:  General exam: Appears calm and comfortable  Respiratory system: Clear to auscultation. Respiratory effort normal. Cardiovascular system: S1 & S2 heard, RRR. No JVD, murmurs, rubs, gallops or clicks. 1+ pedal edema. Gastrointestinal system: Abdomen is nondistended, soft and nontender. No organomegaly or masses felt. Normal bowel sounds heard. GU: massive scrotal edema present Central nervous system: Alert and oriented. No focal neurological deficits. Extremities: Symmetric 5 x 5 power.  Skin: No rashes, lesions or ulcers Psychiatry: Judgement and insight appear normal. Mood & affect appropriate.     Data Reviewed: I have personally reviewed following labs and imaging studies  CBC:  Recent Labs Lab 09/27/15 0435 09/28/15 0427 09/29/15 0839 09/30/15 0428 10/01/15 0349 10/02/15 0454  WBC 13.0* 20.3* 17.2* 13.8* 11.7* 9.1  NEUTROABS 9.3*  --   --   --   --   --   HGB 10.8* 11.2* 10.3* 10.3* 10.1* 9.7*  HCT 34.2* 35.6* 32.7* 32.7* 31.5* 29.9*  MCV 101.8* 101.1* 100.0 99.7 97.8 97.1  PLT 337 345 310 338 355 976   Basic Metabolic Panel:  Recent Labs Lab 09/28/15 0427 09/29/15 0839 09/30/15 0428 10/01/15 0349 10/02/15 0454  NA 139 140 139 138 136  K 5.5* 4.3 3.7 3.9 3.4*  CL 111 109 104 104 102  CO2 24 27 29 27 27   GLUCOSE 104* 154* 147* 95 106*  BUN 22* 23* 17 14 13   CREATININE 2.71* 2.44* 2.02* 1.72* 1.67*  CALCIUM 7.4* 7.1* 7.3* 7.4* 7.3*  MG 1.3* 1.3*  --  1.6*  --   PHOS 4.2  --   --   --   --    GFR: Estimated Creatinine Clearance: 52 mL/min (by C-G formula based on SCr of 1.67 mg/dL (H)). Liver Function Tests: No results for input(s): AST, ALT, ALKPHOS, BILITOT, PROT, ALBUMIN in the last 168 hours. No results for input(s): LIPASE, AMYLASE in the last 168 hours. No results for input(s): AMMONIA in the last 168 hours. Coagulation Profile:  Recent Labs Lab  09/28/15 0427 09/29/15 0502 09/30/15 0428 10/01/15 0349 10/02/15 0454  INR 1.37 1.69 1.45 1.59 1.70   Cardiac Enzymes: No results for input(s): CKTOTAL, CKMB, CKMBINDEX, TROPONINI in the last 168 hours. BNP (last 3 results) No results for input(s): PROBNP in the last 8760 hours. HbA1C: No results for input(s): HGBA1C in the last 72 hours. CBG:  Recent Labs Lab 09/28/15 2143 10/01/15 2003 10/02/15 0032  GLUCAP 113* 126* 126*   Lipid Profile: No results for input(s): CHOL, HDL, LDLCALC, TRIG, CHOLHDL, LDLDIRECT in the last 72 hours. Thyroid Function Tests: No results for input(s): TSH, T4TOTAL, FREET4, T3FREE, THYROIDAB in the last 72 hours. Anemia Panel: No results for input(s): VITAMINB12, FOLATE, FERRITIN, TIBC, IRON, RETICCTPCT in the last 72 hours. Sepsis Labs: No results for input(s): PROCALCITON, LATICACIDVEN in the last 168  hours.  Recent Results (from the past 240 hour(s))  MRSA PCR Screening     Status: None   Collection Time: 09/24/15  2:45 PM  Result Value Ref Range Status   MRSA by PCR NEGATIVE NEGATIVE Final    Comment:        The GeneXpert MRSA Assay (FDA approved for NASAL specimens only), is one component of a comprehensive MRSA colonization surveillance program. It is not intended to diagnose MRSA infection nor to guide or monitor treatment for MRSA infections.   Culture, blood (Routine X 2) w Reflex to ID Panel     Status: None   Collection Time: 09/24/15  5:37 PM  Result Value Ref Range Status   Specimen Description RIGHT ANTECUBITAL  Final   Special Requests BOTTLES DRAWN AEROBIC AND ANAEROBIC [place]  Final   Culture NO GROWTH 5 DAYS  Final   Report Status 09/29/2015 FINAL  Final  Culture, blood (Routine X 2) w Reflex to ID Panel     Status: None   Collection Time: 09/24/15  7:05 PM  Result Value Ref Range Status   Specimen Description BLOOD RIGHT HAND  Final   Special Requests BOTTLES DRAWN AEROBIC AND ANAEROBIC [place]  Final    Culture NO GROWTH 5 DAYS  Final   Report Status 09/29/2015 FINAL  Final         Radiology Studies: No results found.      Scheduled Meds: . bisacodyl  10 mg Rectal BID  . carvedilol  25 mg Oral BID WC  . chlorhexidine  15 mL Mouth Rinse BID  . famotidine (PEPCID) IV  20 mg Intravenous Q24H  . furosemide  10 mg Intravenous Daily  . levothyroxine  25 mcg Intravenous Daily  . mouth rinse  15 mL Mouth Rinse q12n4p  . piperacillin-tazobactam (ZOSYN)  IV  3.375 g Intravenous Q8H   Continuous Infusions: . heparin 2,050 Units/hr (10/02/15 0600)  . lactated ringers 100 mL/hr at 10/02/15 0600     LOS: 14 days    Time spent: 25 minutes    Kathie Dike, MD [facility] If 7PM-7AM, please contact night-coverage [email or web address] Password [email or web address] 10/02/2015, 7:30 AM

## 2015-10-02 NOTE — Progress Notes (Signed)
PT'S PENIS AND SCROTUM ARE EDEMATUS. PT STATES IT IS STARTING TO GET A LITTLE DIFFICULT TO VOID. AREA ELEVATED ON TOWEL FOR COMFORT.

## 2015-10-02 NOTE — Progress Notes (Signed)
ANTICOAGULATION CONSULT NOTE  Pharmacy Consult for Heparin Indication: atrial fibrillation  No Known Allergies  Patient Measurements: Height: 6\' 2"  (188 cm) Weight: 257 lb 15 oz (117 kg) IBW/kg (Calculated) : 82.2  HEPARIN DW (KG): 105.4  Vital Signs: Temp: 97.7 F (36.5 C) (09/23 0835) Temp Source: Axillary (09/23 0835) BP: 147/94 (09/23 0600) Pulse Rate: 84 (09/23 0600)  Labs:  Recent Labs  09/30/15 0428  10/01/15 0349 10/01/15 1824 10/02/15 0454  HGB 10.3*  --  10.1*  --  9.7*  HCT 32.7*  --  31.5*  --  29.9*  PLT 338  --  355  --  348  LABPROT 17.7*  --  19.1*  --  20.2*  INR 1.45  --  1.59  --  1.70  HEPARINUNFRC  --   < > 0.21* 0.39 0.51  CREATININE 2.02*  --  1.72*  --  1.67*  < > = values in this interval not displayed. Estimated Creatinine Clearance: 52 mL/min (by C-G formula based on SCr of 1.67 mg/dL (H)).  Medical History: Past Medical History:  Diagnosis Date  . AF (atrial fibrillation) ([place])   . Cardiomyopathy ([place])    h/o  . Chronic kidney disease, stage 3   . Dysrhythmia   . Hypothyroidism   . Pneumonia   . Prostate cancer ([place])   . Shortness of breath   . Systemic hypertension    Medications:  Prescriptions Prior to Admission  Medication Sig Dispense Refill Last Dose  . Calcium Carbonate-Vitamin D (CALCIUM 600+D) 600-400 MG-UNIT tablet Take 1 tablet by mouth 2 (two) times daily.   09/17/2015 at Unknown time  . carvedilol (COREG) 25 MG tablet Take 37.5 mg by mouth 2 (two) times daily with a meal.   09/17/2015 at 1900  . furosemide (LASIX) 20 MG tablet Take 1 tablet (20 mg total) by mouth daily. (Patient taking differently: Take 20 mg by mouth every other day. ) 30 tablet 0 Past Week at Unknown time  . levothyroxine (SYNTHROID, LEVOTHROID) 50 MCG tablet Take 1 tablet (50 mcg total) by mouth every morning. 30 tablet 6 09/17/2015 at Unknown time  . losartan (COZAAR) 25 MG tablet Take 25 mg by mouth daily.   09/17/2015 at Unknown time  . Vitamin D,  Ergocalciferol, (DRISDOL) 50000 units CAPS capsule Take 50,000 Units by mouth every 30 (thirty) days.   Past Month at Unknown time  . acetaminophen (TYLENOL) 500 MG tablet Take 1,000 mg by mouth every 6 (six) hours as needed for mild pain, moderate pain or headache.   Unknown at Unknown time  . warfarin (COUMADIN) 4 MG tablet Take 4 mg by mouth every evening.   Unknown at Unknown time   Assessment: Heparin drip was on hold post op, asked to resume with no bolus per MD.   Heparin level therapeutic last evening and this morning.  No bleeding noted.  Goal of Therapy:  Monitor platelets by anticoagulation protocol: Yes  Heparin level 0.3-0.7 units/ml   Plan:  Continue heparin drip at 2050 units/hr Daily HL and CBC while on heparin Monitor for bleeding complications  Maeola Sarah [facility] Clinical Pharmacist   10/02/2015  8:36 AM

## 2015-10-02 NOTE — Progress Notes (Signed)
[place] for Rivaroxaban Indication: atrial fibrillation  No Known Allergies  Patient Measurements: Height: 6\' 2"  (188 cm) Weight: 257 lb 15 oz (117 kg) IBW/kg (Calculated) : 82.2  HEPARIN DW (KG): 105.4  Vital Signs: Temp: 97.7 F (36.5 C) (09/23 0835) Temp Source: Axillary (09/23 0835) BP: 142/84 (09/23 0900) Pulse Rate: 99 (09/23 0900)  Labs:  Recent Labs  09/30/15 0428  10/01/15 0349 10/01/15 1824 10/02/15 0454  HGB 10.3*  --  10.1*  --  9.7*  HCT 32.7*  --  31.5*  --  29.9*  PLT 338  --  355  --  348  LABPROT 17.7*  --  19.1*  --  20.2*  INR 1.45  --  1.59  --  1.70  HEPARINUNFRC  --   < > 0.21* 0.39 0.51  CREATININE 2.02*  --  1.72*  --  1.67*  < > = values in this interval not displayed. Estimated Creatinine Clearance: 52 mL/min (by C-G formula based on SCr of 1.67 mg/dL (H)).  Medical History: Past Medical History:  Diagnosis Date  . AF (atrial fibrillation) ([place])   . Cardiomyopathy ([place])    h/o  . Chronic kidney disease, stage 3   . Dysrhythmia   . Hypothyroidism   . Pneumonia   . Prostate cancer ([place])   . Shortness of breath   . Systemic hypertension    Medications:  Prescriptions Prior to Admission  Medication Sig Dispense Refill Last Dose  . Calcium Carbonate-Vitamin D (CALCIUM 600+D) 600-400 MG-UNIT tablet Take 1 tablet by mouth 2 (two) times daily.   09/17/2015 at Unknown time  . carvedilol (COREG) 25 MG tablet Take 37.5 mg by mouth 2 (two) times daily with a meal.   09/17/2015 at 1900  . furosemide (LASIX) 20 MG tablet Take 1 tablet (20 mg total) by mouth daily. (Patient taking differently: Take 20 mg by mouth every other day. ) 30 tablet 0 Past Week at Unknown time  . levothyroxine (SYNTHROID, LEVOTHROID) 50 MCG tablet Take 1 tablet (50 mcg total) by mouth every morning. 30 tablet 6 09/17/2015 at Unknown time  . losartan (COZAAR) 25 MG tablet Take 25 mg by mouth daily.   09/17/2015 at Unknown time  . Vitamin D,  Ergocalciferol, (DRISDOL) 50000 units CAPS capsule Take 50,000 Units by mouth every 30 (thirty) days.   Past Month at Unknown time  . acetaminophen (TYLENOL) 500 MG tablet Take 1,000 mg by mouth every 6 (six) hours as needed for mild pain, moderate pain or headache.   Unknown at Unknown time  . warfarin (COUMADIN) 4 MG tablet Take 4 mg by mouth every evening.   Unknown at Unknown time   Assessment: Heparin drip was on hold post op, asked to resume with no bolus per MD.   Heparin level therapeutic last evening and this morning.  No bleeding noted. MD requests heparin discontinued and rivaroxaban started for non valvular AFIB  Goal of Therapy:  Monitor platelets by anticoagulation protocol: Yes  Rivaroxaban for nonvalvular atrial fibrillation   Plan:  Heparin discontinued Rivaroxaban (Xarelto) 20 mg daily  Monitor CBC, platelets Monitor for bleeding complications  Maeola Sarah [facility] Clinical Pharmacist   10/02/2015  9:17 AM

## 2015-10-02 NOTE — Progress Notes (Signed)
[facility] Hospital Day(s): 14.   Post op day(s): 5 Days Post-Op.   Interval History: Patient seen and examined, no acute events or new complaints overnight. Patient reports +flatus and +BM, denies abdominal pain, N/V, fever/chills, CP, or SOB.  Review of Systems:  Constitutional: denies fever, chills  HEENT: denies cough or congestion  Respiratory: denies any shortness of breath  Cardiovascular: denies chest pain or palpitations  Gastrointestinal: denies N/V, diarrhea or constipation  Musculoskeletal: denies pain, decreased motor or sensation  Neurological: denies HA or vision/hearing changes   Vital signs in last 24 hours: [min-max] current  Temp:  [97 F (36.1 C)-98.4 F (36.9 C)] 97.7 F (36.5 C) (09/23 0835) Pulse Rate:  [74-110] 99 (09/23 0900) Resp:  [0-27] 22 (09/23 0900) BP: (109-160)/(74-95) 142/84 (09/23 0900) SpO2:  [94 %-100 %] 99 % (09/23 0900) Weight:  [117 kg (257 lb 15 oz)] 117 kg (257 lb 15 oz) (09/23 0448)     Height: 6\' 2"  (188 cm) Weight: 117 kg (257 lb 15 oz) BMI (Calculated): 31.7   Intake/Output this shift:  Total I/O In: -  Out: 200 [Urine:200]   Intake/Output last 2 shifts:  @IOLAST2SHIFTS @   Physical Exam:  Constitutional: alert, cooperative and no distress  HENT: normocephalic without obvious abnormality  Eyes: PERRL, EOM's grossly intact and symmetric  Neuro: CN II - XII grossly intact and symmetric without deficit  Respiratory: breathing non-labored at rest  Cardiovascular: regular rate and sinus rhythm  Gastrointestinal: soft, minimal peri-incisional tenderness to palpation, and non-distended with incision well-approximated without erythema or drainage, dressing c/d/i  Musculoskeletal: UE and LE FROM, no edema or wounds, motor and sensation grossly intact, NT   Labs:  CBC:  Lab Results  Component Value Date   WBC 9.1 10/02/2015   RBC 3.08 (L) 10/02/2015   BMP:  Lab Results  Component Value Date   GLUCOSE 106 (H)  10/02/2015   CO2 27 10/02/2015   BUN 13 10/02/2015   BUN 27 06/24/2015   CREATININE 1.67 (H) 10/02/2015   CALCIUM 7.3 (L) 10/02/2015     Imaging studies: No new pertinent imaging studies   Assessment/Plan: 75 y.o. male with resolving post-operative ileus 5 Days Post-Op s/p Right inguinal hernia repair and small bowel resection for Right inguinal hernia with small bowel compromise secondary to strangulation, complicated by pertinent comorbidities including HTN, cardiomyopathy, atrial fibrillation, CKD, hypothyroidism, and prostate cancer.   - diet as tolerated   - ambulation encouraged   - medical management of co-morbidities as per medical team   - surgical follow-up with Dr. Arnoldo Morale in 2 weeks after discharged  - anticoagulation as per medical team  - discharge planning per medicine   All of the above findings and recommendations were discussed with the patient, patient's family, and the medical team, and all of patient's and family's questions were answered to their expressed satisfaction.  -- Marilynne Drivers Rosana Hoes, MD, [place]: [facility] Office: [phone]

## 2015-10-03 DIAGNOSIS — I10 Essential (primary) hypertension: Secondary | ICD-10-CM | POA: Diagnosis not present

## 2015-10-03 DIAGNOSIS — E876 Hypokalemia: Secondary | ICD-10-CM | POA: Diagnosis not present

## 2015-10-03 DIAGNOSIS — I482 Chronic atrial fibrillation: Secondary | ICD-10-CM | POA: Diagnosis not present

## 2015-10-03 DIAGNOSIS — E039 Hypothyroidism, unspecified: Secondary | ICD-10-CM | POA: Diagnosis not present

## 2015-10-03 DIAGNOSIS — E860 Dehydration: Secondary | ICD-10-CM | POA: Diagnosis not present

## 2015-10-03 DIAGNOSIS — N179 Acute kidney failure, unspecified: Secondary | ICD-10-CM | POA: Diagnosis not present

## 2015-10-03 DIAGNOSIS — N183 Chronic kidney disease, stage 3 (moderate): Secondary | ICD-10-CM | POA: Diagnosis not present

## 2015-10-03 LAB — CBC
HCT: 30.4 % — ABNORMAL LOW (ref 39.0–52.0)
HEMOGLOBIN: 9.9 g/dL — AB (ref 13.0–17.0)
MCH: 31.9 pg (ref 26.0–34.0)
MCHC: 32.6 g/dL (ref 30.0–36.0)
MCV: 98.1 fL (ref 78.0–100.0)
Platelets: 403 10*3/uL — ABNORMAL HIGH (ref 150–400)
RBC: 3.1 MIL/uL — AB (ref 4.22–5.81)
RDW: 12.4 % (ref 11.5–15.5)
WBC: 8.8 10*3/uL (ref 4.0–10.5)

## 2015-10-03 LAB — COMPREHENSIVE METABOLIC PANEL
ALBUMIN: 1.8 g/dL — AB (ref 3.5–5.0)
ALK PHOS: 68 U/L (ref 38–126)
ALT: 16 U/L — AB (ref 17–63)
ANION GAP: 8 (ref 5–15)
AST: 20 U/L (ref 15–41)
BUN: 11 mg/dL (ref 6–20)
CALCIUM: 7.7 mg/dL — AB (ref 8.9–10.3)
CO2: 28 mmol/L (ref 22–32)
Chloride: 103 mmol/L (ref 101–111)
Creatinine, Ser: 1.71 mg/dL — ABNORMAL HIGH (ref 0.61–1.24)
GFR calc Af Amer: 43 mL/min — ABNORMAL LOW (ref >60)
GFR calc non Af Amer: 37 mL/min — ABNORMAL LOW (ref >60)
GLUCOSE: 105 mg/dL — AB (ref 65–99)
Potassium: 3.2 mmol/L — ABNORMAL LOW (ref 3.5–5.1)
SODIUM: 139 mmol/L (ref 135–145)
Total Bilirubin: 0.7 mg/dL (ref 0.3–1.2)
Total Protein: 5.9 g/dL — ABNORMAL LOW (ref 6.5–8.1)

## 2015-10-03 LAB — C DIFFICILE QUICK SCREEN W PCR REFLEX
C Diff antigen: NEGATIVE
C Diff interpretation: NOT DETECTED
C Diff toxin: NEGATIVE

## 2015-10-03 MED ORDER — CARVEDILOL 12.5 MG PO TABS
37.5000 mg | ORAL_TABLET | Freq: Two times a day (BID) | ORAL | Status: DC
  Administered 2015-10-03 – 2015-10-06 (×5): 37.5 mg via ORAL
  Filled 2015-10-03 (×5): qty 3

## 2015-10-03 MED ORDER — POTASSIUM CHLORIDE CRYS ER 20 MEQ PO TBCR
40.0000 meq | EXTENDED_RELEASE_TABLET | ORAL | Status: AC
  Administered 2015-10-03 – 2015-10-04 (×2): 40 meq via ORAL
  Filled 2015-10-03 (×2): qty 2

## 2015-10-03 NOTE — Progress Notes (Signed)
[facility] Hospital Day(s): 15.   Post op day(s): 6 Days Post-Op.   Interval History: Patient seen and examined, no acute events or new complaints overnight. Patient reports he's been tolerating his diet and denies any worsening of mild peri-incisional abdominal pain, N/V, fever/chills, CP, or SOB. He states that he did not ambulate yesterday and does not want to walk until he plans to do so with physical therapy tomorrow.  Review of Systems:  Constitutional: denies fever, chills  HEENT: denies cough or congestion  Respiratory: denies any shortness of breath  Cardiovascular: denies chest pain or palpitations  Gastrointestinal: denies N/V, diarrhea or constipation  Musculoskeletal: denies pain, decreased motor or sensation  Neurological: denies HA or vision/hearing changes   Vital signs in last 24 hours: [min-max] current  Temp:  [97 F (36.1 C)-98.5 F (36.9 C)] 97.8 F (36.6 C) (09/24 0415) Pulse Rate:  [28-100] 94 (09/24 0415) Resp:  [18-33] 18 (09/24 0415) BP: (114-144)/(69-90) 141/80 (09/24 0415) SpO2:  [66 %-100 %] 100 % (09/24 0415) Weight:  [115.2 kg (253 lb 15.5 oz)] 115.2 kg (253 lb 15.5 oz) (09/24 0415)     Height: 6\' 2"  (188 cm) Weight: 115.2 kg (253 lb 15.5 oz) BMI (Calculated): 31.7   Intake/Output this shift:  Total I/O In: 120 [P.O.:120] Out: 1000 [Urine:1000]   Intake/Output last 2 shifts:  @IOLAST2SHIFTS @   Physical Exam:  Constitutional: alert, cooperative and no distress  HENT: normocephalic without obvious abnormality  Eyes: PERRL, EOM's grossly intact and symmetric  Neuro: CN II - XII grossly intact and symmetric without deficit  Respiratory: breathing non-labored at rest  Cardiovascular: regular rate and sinus rhythm  Gastrointestinal: soft, minimal peri-incisional tenderness to palpation, and non-distended with incision well-approximated without erythema or drainage, dressing c/d/i Musculoskeletal: UE and LE FROM, motor and sensation  grossly intact  Labs:  CBC:  Lab Results  Component Value Date   WBC 8.8 10/03/2015   RBC 3.10 (L) 10/03/2015   BMP:  Lab Results  Component Value Date   GLUCOSE 105 (H) 10/03/2015   CO2 28 10/03/2015   BUN 11 10/03/2015   BUN 27 06/24/2015   CREATININE 1.71 (H) 10/03/2015   CALCIUM 7.7 (L) 10/03/2015     Imaging studies: No new pertinent imaging studies   Assessment/Plan: 75 y.o. male with resolving post-operative ileus 6 Days Post-Op s/p Right inguinal hernia repair and small bowel resection for Right inguinal hernia with small bowel compromise secondary to hernial strangulation, complicated by pertinent comorbidities including HTN, cardiomyopathy, atrial fibrillation, CKD, hypothyroidism, and prostate cancer.              - diet as tolerated              - ambulation encouraged              - medical management of co-morbidities as per medical team              - surgical follow-up with Dr. Arnoldo Morale in 2 weeks after discharged             - anticoagulation as per medical team             - discharge planning per medicine  All of the above findings and recommendations were discussed with the patient, patient's family, and the medical team, and all of patient's and family's questions were answered to their expressed satisfaction.  -- Marilynne Drivers Rosana Hoes, MD, [place]: [facility] and  Vascular Surgery Office: [phone]

## 2015-10-03 NOTE — Progress Notes (Signed)
Lactated ringers d/c approx 4 pm.

## 2015-10-03 NOTE — Progress Notes (Signed)
PROGRESS NOTE    [name] [name]  JJH:[id number] DOB: [birth year] DOA: 09/18/2015 PCP: [name], MD    Brief Narrative:  64 yom with a hx of afib on coumadin, CKD, HTN, hypothyroidism, and cardiomyopathy presented with complaints of nausea and vomiting. Patient had a syncopal episode that lasted a few seconds while attempting to stand. While in the ED, he was noted to be mildly tachycardic. He had an elevated BUN 95 and creatinine 6.53. He was found to have a strangulated right inguinal hernia and SBO. He had a small bowel resection and repair of the right inguinal hernia on 09/27/15. Will continue to manage.   Assessment & Plan:   Principal Problem:   AKI (acute kidney injury) ([place]) Active Problems:   Atrial fibrillation ([place])   Long term current use of anticoagulant therapy   Essential hypertension   CKD (chronic kidney disease) stage 3, GFR 30-59 ml/min   Orthostatic syncope   Vomiting   Dehydration   Cardiomyopathy (H[facility])   Hypokalemia   Warfarin-induced coagulopathy (HCC)   Constipation   Hypothyroidism   SBO (small bowel obstruction) ([place])   Incarcerated right inguinal hernia   Protein-calorie malnutrition, severe  1. Incarcerated-strangulated right inguinal hernia withSBO. CT of abdomen/pelvis revealed high-grade bowel obstruction due to incarcerated right inguinal hernia. Surgery was consulted and preformed partial SBO resection and repair of the right inguinal hernia on 9/18. Bowel function has returned. He has been advanced to a soft diet. Continue supportive management. 2. Low-grade fever. Patient developed a low-grade fever on 9/15. His WBC count has improved to 8,800.Repeat UA was unremarkable. Blood cultures remain negative. Zosyn has been discontinued. 3. Hypokalemia. Slightly low. Continue on potassium supplementation. Recheck labs in the am. 4. HTN.  Blood pressures are slightly elevated. Continue to monitor. 5. AKI superimposed on CKD stage lll.  Creatinine was 6.53 on admission. Hiscreatinine has been holding steady at around 1.71, which is his baseline. Monitor input and output. Continue to hold ARB.  6. Chronic afib with RVR. He was anticoagulated with coumadin and coreg on admission. His INR was supratherapeutic at 4.88 on admission. Coreg was initially held and he was started on IV lopressor and cardizem infusion. He has since improved and has been restarted on coreg. He is still having bursts of tachycardia. Will increase coreg back to home dose. He is anticoagulated with Xarelto. 7. Hypothyroidism. TSH is within normal limits. Pt has been restarted on an oral dose of synthroid. 8. Hypocalcemia. Calcium 7.3. Albumin is low at 1.8. Corrected calcium is 9.5 9. Anemia. Hemoglobin 9.9. No obvious signs of bleeding. Continue to monitor. 10. Scrotal edema. Likely related to volume resuscitation and low albumin. Appears to be improving with diuresis. Continue lasix    DVT prophylaxis: Xarelto Code Status: Full Family Communication: discussed with patient Disposition Plan: Discharge to SNF when stable  Consultants:   General surgery  Procedures:   Partial small bowel resection and repair of incarcerated right inguinal hernia 09/27/2015  Antimicrobials:   Vancomycin 9/18 >> 9/22  Zosyn 9/18 >>   Cipro 9/17 >> 9/18  Flagyl 9/16 >> 9/18  Rocephin >> 9/16   Subjective: Feeling better. Feels scrotal edema is improving  Objective: Vitals:   10/02/15 1800 10/02/15 2002 10/03/15 0002 10/03/15 0415  BP: 135/69 (!) 144/85 127/72 (!) 141/80  Pulse: (!) 28 100 98 94  Resp: 19 20 19 18   Temp:  98.5 F (36.9 C) 98.2 F (36.8 C) 97.8 F (36.6 C)  TempSrc:  Oral Oral Axillary  SpO2: (!) 66% 99% 98% 100%  Weight:    115.2 kg (253 lb 15.5 oz)  Height:        Intake/Output Summary (Last 24 hours) at 10/03/15 0707 Last data filed at 10/03/15 0427  Gross per 24 hour  Intake             2570 ml  Output             4275 ml    Net            -1705 ml   Filed Weights   10/01/15 0500 10/02/15 0448 10/03/15 0415  Weight: 117.6 kg (259 lb 4.2 oz) 117 kg (257 lb 15 oz) 115.2 kg (253 lb 15.5 oz)    Examination:   General exam: Appears calm and comfortable  Respiratory system: Clear to auscultation. Respiratory effort normal. Cardiovascular system: S1 & S2 heard, RRR. No JVD, murmurs, rubs, gallops or clicks. 1+ pedal edema. Gastrointestinal system: Abdomen is nondistended, soft and nontender. No organomegaly or masses felt. Normal bowel sounds heard.  GU: scrotal edema improving Central nervous system: Alert and oriented. No focal neurological deficits. Extremities: Symmetric 5 x 5 power. Skin: No rashes, lesions or ulcers Psychiatry: Judgement and insight appear normal. Mood & affect appropriate.    Data Reviewed: I have personally reviewed following labs and imaging studies  CBC:  Recent Labs Lab 09/27/15 0435 09/28/15 0427 09/29/15 0839 09/30/15 0428 10/01/15 0349 10/02/15 0454  WBC 13.0* 20.3* 17.2* 13.8* 11.7* 9.1  NEUTROABS 9.3*  --   --   --   --   --   HGB 10.8* 11.2* 10.3* 10.3* 10.1* 9.7*  HCT 34.2* 35.6* 32.7* 32.7* 31.5* 29.9*  MCV 101.8* 101.1* 100.0 99.7 97.8 97.1  PLT 337 345 310 338 355 347   Basic Metabolic Panel:  Recent Labs Lab 09/28/15 0427 09/29/15 0839 09/30/15 0428 10/01/15 0349 10/02/15 0454  NA 139 140 139 138 136  K 5.5* 4.3 3.7 3.9 3.4*  CL 111 109 104 104 102  CO2 24 27 29 27 27   GLUCOSE 104* 154* 147* 95 106*  BUN 22* 23* 17 14 13   CREATININE 2.71* 2.44* 2.02* 1.72* 1.67*  CALCIUM 7.4* 7.1* 7.3* 7.4* 7.3*  MG 1.3* 1.3*  --  1.6*  --   PHOS 4.2  --   --   --   --    GFR: Estimated Creatinine Clearance: 51.6 mL/min (by C-G formula based on SCr of 1.67 mg/dL (H)). Liver Function Tests: No results for input(s): AST, ALT, ALKPHOS, BILITOT, PROT, ALBUMIN in the last 168 hours. No results for input(s): LIPASE, AMYLASE in the last 168 hours. No results for  input(s): AMMONIA in the last 168 hours. Coagulation Profile:  Recent Labs Lab 09/28/15 0427 09/29/15 0502 09/30/15 0428 10/01/15 0349 10/02/15 0454  INR 1.37 1.69 1.45 1.59 1.70   Cardiac Enzymes: No results for input(s): CKTOTAL, CKMB, CKMBINDEX, TROPONINI in the last 168 hours. BNP (last 3 results) No results for input(s): PROBNP in the last 8760 hours. HbA1C: No results for input(s): HGBA1C in the last 72 hours. CBG:  Recent Labs Lab 09/28/15 2143 10/01/15 2003 10/02/15 0032  GLUCAP 113* 126* 126*   Lipid Profile: No results for input(s): CHOL, HDL, LDLCALC, TRIG, CHOLHDL, LDLDIRECT in the last 72 hours. Thyroid Function Tests: No results for input(s): TSH, T4TOTAL, FREET4, T3FREE, THYROIDAB in the last 72 hours. Anemia Panel: No results for input(s): VITAMINB12, FOLATE, FERRITIN, TIBC, IRON, RETICCTPCT  in the last 72 hours. Sepsis Labs: No results for input(s): PROCALCITON, LATICACIDVEN in the last 168 hours.  Recent Results (from the past 240 hour(s))  MRSA PCR Screening     Status: None   Collection Time: 09/24/15  2:45 PM  Result Value Ref Range Status   MRSA by PCR NEGATIVE NEGATIVE Final    Comment:        The GeneXpert MRSA Assay (FDA approved for NASAL specimens only), is one component of a comprehensive MRSA colonization surveillance program. It is not intended to diagnose MRSA infection nor to guide or monitor treatment for MRSA infections.   Culture, blood (Routine X 2) w Reflex to ID Panel     Status: None   Collection Time: 09/24/15  5:37 PM  Result Value Ref Range Status   Specimen Description RIGHT ANTECUBITAL  Final   Special Requests BOTTLES DRAWN AEROBIC AND ANAEROBIC [place]  Final   Culture NO GROWTH 5 DAYS  Final   Report Status 09/29/2015 FINAL  Final  Culture, blood (Routine X 2) w Reflex to ID Panel     Status: None   Collection Time: 09/24/15  7:05 PM  Result Value Ref Range Status   Specimen Description BLOOD RIGHT HAND   Final   Special Requests BOTTLES DRAWN AEROBIC AND ANAEROBIC [place]  Final   Culture NO GROWTH 5 DAYS  Final   Report Status 09/29/2015 FINAL  Final     Radiology Studies: No results found.   Scheduled Meds: . carvedilol  25 mg Oral BID WC  . chlorhexidine  15 mL Mouth Rinse BID  . famotidine (PEPCID) IV  20 mg Intravenous Q24H  . furosemide  40 mg Intravenous BID  . levothyroxine  50 mcg Oral QAC breakfast  . mouth rinse  15 mL Mouth Rinse q12n4p  . rivaroxaban  20 mg Oral Q supper   Continuous Infusions: . lactated ringers 100 mL/hr at 10/03/15 0431     LOS: 15 days   Time spent: 25 minutes  Kathie Dike, MD [facility] If 7PM-7AM, please contact night-coverage [email or web address] Password [email or web address] 10/03/2015, 7:07 AM

## 2015-10-04 DIAGNOSIS — N183 Chronic kidney disease, stage 3 (moderate): Secondary | ICD-10-CM | POA: Diagnosis not present

## 2015-10-04 DIAGNOSIS — E039 Hypothyroidism, unspecified: Secondary | ICD-10-CM | POA: Diagnosis not present

## 2015-10-04 DIAGNOSIS — E860 Dehydration: Secondary | ICD-10-CM | POA: Diagnosis not present

## 2015-10-04 DIAGNOSIS — I482 Chronic atrial fibrillation: Secondary | ICD-10-CM | POA: Diagnosis not present

## 2015-10-04 DIAGNOSIS — E876 Hypokalemia: Secondary | ICD-10-CM | POA: Diagnosis not present

## 2015-10-04 DIAGNOSIS — I10 Essential (primary) hypertension: Secondary | ICD-10-CM | POA: Diagnosis not present

## 2015-10-04 DIAGNOSIS — N179 Acute kidney failure, unspecified: Secondary | ICD-10-CM | POA: Diagnosis not present

## 2015-10-04 LAB — BASIC METABOLIC PANEL
Anion gap: 9 (ref 5–15)
BUN: 13 mg/dL (ref 6–20)
CHLORIDE: 101 mmol/L (ref 101–111)
CO2: 28 mmol/L (ref 22–32)
CREATININE: 1.79 mg/dL — AB (ref 0.61–1.24)
Calcium: 7.5 mg/dL — ABNORMAL LOW (ref 8.9–10.3)
GFR calc Af Amer: 41 mL/min — ABNORMAL LOW (ref >60)
GFR calc non Af Amer: 35 mL/min — ABNORMAL LOW (ref >60)
GLUCOSE: 110 mg/dL — AB (ref 65–99)
Potassium: 3.5 mmol/L (ref 3.5–5.1)
Sodium: 138 mmol/L (ref 135–145)

## 2015-10-04 LAB — CBC
HCT: 31.3 % — ABNORMAL LOW (ref 39.0–52.0)
HEMOGLOBIN: 10 g/dL — AB (ref 13.0–17.0)
MCH: 31.6 pg (ref 26.0–34.0)
MCHC: 31.9 g/dL (ref 30.0–36.0)
MCV: 99.1 fL (ref 78.0–100.0)
Platelets: 465 10*3/uL — ABNORMAL HIGH (ref 150–400)
RBC: 3.16 MIL/uL — AB (ref 4.22–5.81)
RDW: 12.6 % (ref 11.5–15.5)
WBC: 9.7 10*3/uL (ref 4.0–10.5)

## 2015-10-04 MED ORDER — FAMOTIDINE 20 MG PO TABS
20.0000 mg | ORAL_TABLET | Freq: Every day | ORAL | Status: DC
  Administered 2015-10-04 – 2015-10-06 (×3): 20 mg via ORAL
  Filled 2015-10-04 (×3): qty 1

## 2015-10-04 MED ORDER — RIVAROXABAN 15 MG PO TABS
15.0000 mg | ORAL_TABLET | Freq: Every day | ORAL | Status: DC
  Administered 2015-10-04 – 2015-10-05 (×2): 15 mg via ORAL
  Filled 2015-10-04 (×2): qty 1

## 2015-10-04 MED ORDER — POTASSIUM CHLORIDE CRYS ER 20 MEQ PO TBCR
40.0000 meq | EXTENDED_RELEASE_TABLET | Freq: Once | ORAL | Status: AC
  Administered 2015-10-04: 40 meq via ORAL
  Filled 2015-10-04: qty 2

## 2015-10-04 MED ORDER — DILTIAZEM HCL 60 MG PO TABS
60.0000 mg | ORAL_TABLET | Freq: Two times a day (BID) | ORAL | Status: DC
  Administered 2015-10-04 – 2015-10-05 (×2): 60 mg via ORAL
  Filled 2015-10-04 (×2): qty 1

## 2015-10-04 NOTE — Clinical Social Work Note (Addendum)
Clinical Social Work Assessment  Patient Details  Name: [name] [name] MRN: [id number] Date of Birth: 05-28-1940  Date of referral:  10/04/15               Reason for consult:  Discharge Planning                Permission sought to share information with:    Permission granted to share information::     Name::        Agency::     Relationship::     Contact Information:     Housing/Transportation Living arrangements for the past 2 months:  Single Family Home Source of Information:  Patient Patient Interpreter Needed:  None Criminal Activity/Legal Involvement Pertinent to Current Situation/Hospitalization:  No - Comment as needed Significant Relationships:  Adult Children Lives with:  Self Do you feel safe going back to the place where you live?  Yes Need for family participation in patient care:  No (Coment)  Care giving concerns:  Pt lives alone and is not at baseline.    Social Worker assessment / plan:  CSW met with pt at bedside. Pt alert and oriented and reports he lives alone. His two sons live locally and are supportive. Pt expressed that he is very weak compared to his baseline. He is used to being active and is independent and still drives. Pt has been in hospital for extended period of time and had surgery. He recognizes that rehab will be necessary before he returns home. Pt requests [facility] if possible and is aware of insurance authorization. Notified [facility] for authorization.   Employment status:  Retired Nurse, adult PT Recommendations:  [place] / Referral to community resources:  [place]  Patient/Family's Response to care:  Pt very agreeable to short term SNF prior to return home.   Patient/Family's Understanding of and Emotional Response to Diagnosis, Current Treatment, and Prognosis:  Pt aware of admission diagnosis and that he is approaching d/c. He feels that he will need rehab  in order to return home alone.   Emotional Assessment Appearance:  Appears stated age Attitude/Demeanor/Rapport:  Other (Pleasant) Affect (typically observed):  Appropriate Orientation:  Oriented to Self, Oriented to Place, Oriented to  Time, Oriented to Situation Alcohol / Substance use:  Not Applicable Psych involvement (Current and /or in the community):  No (Comment)  Discharge Needs  Concerns to be addressed:  Discharge Planning Concerns Readmission within the last 30 days:  No Current discharge risk:  None Barriers to Discharge:  No Barriers Identified   Salome Arnt, [place] 10/04/2015, 9:26 AM

## 2015-10-04 NOTE — Progress Notes (Signed)
Physical Therapy Treatment Patient Details Name: BENJIMEN KELLEY MRN: [id number] DOB: November 02, 1940 Today's Date: 10/04/2015    History of Present Illness 75 y.o. male with medical history significant of HTN, CKD baseline cr around 2, afib on coumadin comes in for the second time this week for persistent vomiting.  He came in 2 days ago with cc of vomiting at that time.  He was told to stop taking some of his pills but is not sure which ones.  He went home and continued to vomit.  The vomiting comes right after he experiences suprapubic pain and disfomfort.  He denies any dysuria, no urinaary incontinence.  No hematuria.  No pain above the suprapubic area.  No diarrhea. No constipation.  The pain is not related with eating but is improved after his vomits.  He has had no sick contacts.  Today was very weak and his sons went to check on him and he was too weak to even stand.  They tried to get him up and he passed out when he stood up.  They finally got him into the car to come to the ED.  Pt found to be very dehydrated with worsening renal failure and was referred for admission for such.  09/22/15 CT scan of the abdomen/pelvis was ordered for evaluation of persistent abdominal distention and vomiting. It revealed a high-grade bowel obstruction due to incarcerated right inguinal hernia. His lactic acid level was within normal limits.  NG tube was inserted and it drained a copious amount of green bilious fluid.  Dr. Arnoldo Morale was consulted. He did not believe the patient had evidence of strangulation or gangrenous changes on exam.  Pt is now s/p R inguinal hernorrhapy with mesh.      PT Comments    Pt received in bed, son present, and pt is agreeable to PT tx.  Son ambulated with pt and PT today due to pt's preference.  He was able to ambulate 45ft with RW and Min guard, however at end of tx, RN was called by tele to notify of HR up into the 170's bpm.  Continue to recommend SNF due to pt's continued decreased  balance, mobility, strength and endurance.    Follow Up Recommendations  SNF     Equipment Recommendations  Other (comment) (TBD)    Recommendations for Other Services       Precautions / Restrictions Precautions Precautions: Fall Precaution Comments: fell 3x's just prior to admission. 1) in the kitchen, 2) oldest son spent the night, and pt had fell off the bed and couldn't get up.  3) The next night, both boys were helping him, and he passed out.   Restrictions Weight Bearing Restrictions: No    Mobility  Bed Mobility Overal bed mobility: Needs Assistance Bed Mobility: Rolling;Sidelying to Sit Rolling: Min guard Sidelying to sit: HOB elevated;Min guard          Transfers Overall transfer level: Needs assistance Equipment used: Rolling walker (2 wheeled) Transfers: Sit to/from Stand Sit to Stand: Min guard Stand pivot transfers: Min guard          Ambulation/Gait Ambulation/Gait assistance: Min guard Ambulation Distance (Feet): 80 Feet Assistive device: Rolling walker (2 wheeled) Gait Pattern/deviations: Step-through pattern;Trunk flexed   Gait velocity interpretation: <1.8 ft/sec, indicative of risk for recurrent falls General Gait Details: Pt's son ambulated with PT and pt today due to pt's comfort level.  Pt demonstrates decreased B stride with notable unsteadiness in B ankles.  After ambulation, RN  was notified by tele that pt's HR had gone up to 170bpm.     Stairs            Wheelchair Mobility    Modified Rankin (Stroke Patients Only)       Balance Overall balance assessment: Needs assistance Sitting-balance support: Bilateral upper extremity supported       Standing balance support: Bilateral upper extremity supported Standing balance-Leahy Scale: Fair                      Cognition Arousal/Alertness: Awake/alert Behavior During Therapy: WFL for tasks assessed/performed Overall Cognitive Status: Within Functional Limits for  tasks assessed                      Exercises      General Comments        Pertinent Vitals/Pain Pain Assessment: No/denies pain    Home Living                      Prior Function            PT Goals (current goals can now be found in the care plan section) Acute Rehab PT Goals Patient Stated Goal: Pt wants to get stronger, and wants the pain to go away.  PT Goal Formulation: With patient Time For Goal Achievement: 10/08/15 Potential to Achieve Goals: Good Progress towards PT goals: Progressing toward goals    Frequency    Min 4X/week      PT Plan Current plan remains appropriate    Co-evaluation             End of Session Equipment Utilized During Treatment: Gait belt Activity Tolerance: Patient limited by fatigue Patient left: in chair;with call bell/phone within reach;with family/visitor present     Time: 1232-1248 PT Time Calculation (min) (ACUTE ONLY): 16 min  Charges:  $Gait Training: 8-22 mins                    G Codes:      Beth Martavion Couper, PT, DPT X: [phone]

## 2015-10-04 NOTE — Progress Notes (Signed)
PROGRESS NOTE    [name] [name]  AST:[id number] DOB: [birth year] DOA: 09/18/2015 PCP: [name], MD   Brief Narrative:  62 yom with a hx of afib on coumadin, CKD, HTN, hypothyroidism, and cardiomyopathy presented with complaints of nausea and vomiting. Patient had a syncopal episode that lasted a few seconds while attempting to stand. While in the ED, he was noted to be mildly tachycardic. He had an elevated BUN 95 and creatinine 6.53. He was found to have a strangulated right inguinal hernia and SBO. He had a small bowel resection and repair of the right inguinal hernia on 09/27/15. Will continue to manage.   Assessment & Plan:   Principal Problem:   AKI (acute kidney injury) ([place]) Active Problems:   Atrial fibrillation ([place])   Long term current use of anticoagulant therapy   Essential hypertension   CKD (chronic kidney disease) stage 3, GFR 30-59 ml/min   Orthostatic syncope   Vomiting   Dehydration   Cardiomyopathy (HCC)   Hypokalemia   Warfarin-induced coagulopathy (HCC)   Constipation   Hypothyroidism   SBO (small bowel obstruction) ([place])   Incarcerated right inguinal hernia   Protein-calorie malnutrition, severe  1. Incarcerated-strangulated right inguinal hernia withSBO. CT of abdomen/pelvis revealed high-grade bowel obstruction due to incarcerated right inguinal hernia. Surgery was consulted and preformed partial SBO resection and repair of the right inguinal hernia on 9/18. Bowel function has returned. He has been advanced to a soft diet. Continue supportive management. 2. Low-grade fever. Patient developed a low-grade fever on 9/15. His WBC count has improved to 8,800.Repeat UA was unremarkable. Blood cultures remain negative. Antibiotics have been discontinued 3. Hypokalemia. replaced 4. HTN.  Blood pressures are slightly elevated. Continue to monitor. 5. AKI superimposed on CKD stage lll. Creatinine was 6.53 on admission. Hiscreatinine has been holding  steady at around 1.71, which is his baseline. Monitor input and output. Continue to hold ARB.  6. Chronic afib with RVR. He was anticoagulated with coumadin and coreg on admission. His INR was supratherapeutic at 4.88 on admission. Coreg was initially held and he was started on IV lopressor and cardizem infusion. He has since improved and has been restarted on coreg. He is still having bursts of tachycardia. Continue coreg and add low dose diltiazem. He is anticoagulated with Xarelto. 7. Hypothyroidism. TSH is within normal limits. Continue synthroid. 8. Hypocalcemia. Calcium 7.3. Albumin is low at 1.8. Corrected calcium is 9.5 9. Anemia. Hemoglobin 9.9. No obvious signs of bleeding. Continue to monitor. 10. Scrotal edema. Likely related to volume resuscitation and low albumin. Appears to be improving with diuresis. Continue lasix    DVT prophylaxis: Xarelto  Code Status: Full  Family Communication: discussed with son at bedside Disposition Plan: Discharge to [facility] once improved.    Consultants:   General surgery   Procedures:   Partial small bowel resection and repair of incarcerated right inguinal hernia 09/27/2015  Antimicrobials:   Vancomycin 9/18 >>9/22   Zosyn 9/18 >>  Cipro 9/17 >> 9/18   Flagyl 9/16 >> 9/18   Rocephin 9/16 >> 9/16   Subjective: Complaining of persistent scrotal edema and difficulty urinating. He is able to pass urine, but says it requires a lot of effort. No abdominal pain, no vomiting.  Objective: Vitals:   10/03/15 0415 10/03/15 1502 10/03/15 2135 10/04/15 0500  BP: (!) 141/80 138/83 116/73 122/74  Pulse: 94 88 (!) 101 100  Resp: 18 18 20 20   Temp: 97.8 F (36.6 C) 98.3 F (  36.8 C) 98.4 F (36.9 C) 98.5 F (36.9 C)  TempSrc: Axillary Oral Oral Oral  SpO2: 100% 100% 97% 96%  Weight: 115.2 kg (253 lb 15.5 oz)   115.2 kg (253 lb 15.9 oz)  Height:        Intake/Output Summary (Last 24 hours) at 10/04/15 0651 Last data filed at  10/04/15 0542  Gross per 24 hour  Intake              360 ml  Output             2580 ml  Net            -2220 ml   Filed Weights   10/02/15 0448 10/03/15 0415 10/04/15 0500  Weight: 117 kg (257 lb 15 oz) 115.2 kg (253 lb 15.5 oz) 115.2 kg (253 lb 15.9 oz)    Examination:  General exam: Appears calm and comfortable  Respiratory system: Clear to auscultation. Respiratory effort normal. Cardiovascular system: S1 & S2 heard, RRR. No JVD, murmurs, rubs, gallops or clicks. 1+ pedal edema. Gastrointestinal system: Abdomen is nondistended, soft and nontender. No organomegaly or masses felt. Normal bowel sounds heard. GU: persistent scrotal edema Central nervous system: Alert and oriented. No focal neurological deficits. Extremities: Symmetric 5 x 5 power. Skin: No rashes, lesions or ulcers Psychiatry: Judgement and insight appear normal. Mood & affect appropriate.     Data Reviewed: I have personally reviewed following labs and imaging studies  CBC:  Recent Labs Lab 09/29/15 0839 09/30/15 0428 10/01/15 0349 10/02/15 0454 10/03/15 0648  WBC 17.2* 13.8* 11.7* 9.1 8.8  HGB 10.3* 10.3* 10.1* 9.7* 9.9*  HCT 32.7* 32.7* 31.5* 29.9* 30.4*  MCV 100.0 99.7 97.8 97.1 98.1  PLT 310 338 355 348 381*   Basic Metabolic Panel:  Recent Labs Lab 09/28/15 0427 09/29/15 0839 09/30/15 0428 10/01/15 0349 10/02/15 0454 10/03/15 0648  NA 139 140 139 138 136 139  K 5.5* 4.3 3.7 3.9 3.4* 3.2*  CL 111 109 104 104 102 103  CO2 24 27 29 27 27 28   GLUCOSE 104* 154* 147* 95 106* 105*  BUN 22* 23* 17 14 13 11   CREATININE 2.71* 2.44* 2.02* 1.72* 1.67* 1.71*  CALCIUM 7.4* 7.1* 7.3* 7.4* 7.3* 7.7*  MG 1.3* 1.3*  --  1.6*  --   --   PHOS 4.2  --   --   --   --   --    GFR: Estimated Creatinine Clearance: 50.4 mL/min (by C-G formula based on SCr of 1.71 mg/dL (H)). Liver Function Tests:  Recent Labs Lab 10/03/15 0648  AST 20  ALT 16*  ALKPHOS 68  BILITOT 0.7  PROT 5.9*  ALBUMIN 1.8*     No results for input(s): LIPASE, AMYLASE in the last 168 hours. No results for input(s): AMMONIA in the last 168 hours. Coagulation Profile:  Recent Labs Lab 09/28/15 0427 09/29/15 0502 09/30/15 0428 10/01/15 0349 10/02/15 0454  INR 1.37 1.69 1.45 1.59 1.70   Cardiac Enzymes: No results for input(s): CKTOTAL, CKMB, CKMBINDEX, TROPONINI in the last 168 hours. BNP (last 3 results) No results for input(s): PROBNP in the last 8760 hours. HbA1C: No results for input(s): HGBA1C in the last 72 hours. CBG:  Recent Labs Lab 09/28/15 2143 10/01/15 2003 10/02/15 0032  GLUCAP 113* 126* 126*   Lipid Profile: No results for input(s): CHOL, HDL, LDLCALC, TRIG, CHOLHDL, LDLDIRECT in the last 72 hours. Thyroid Function Tests: No results for input(s): TSH, T4TOTAL, FREET4,  T3FREE, THYROIDAB in the last 72 hours. Anemia Panel: No results for input(s): VITAMINB12, FOLATE, FERRITIN, TIBC, IRON, RETICCTPCT in the last 72 hours. Sepsis Labs: No results for input(s): PROCALCITON, LATICACIDVEN in the last 168 hours.  Recent Results (from the past 240 hour(s))  MRSA PCR Screening     Status: None   Collection Time: 09/24/15  2:45 PM  Result Value Ref Range Status   MRSA by PCR NEGATIVE NEGATIVE Final    Comment:        The GeneXpert MRSA Assay (FDA approved for NASAL specimens only), is one component of a comprehensive MRSA colonization surveillance program. It is not intended to diagnose MRSA infection nor to guide or monitor treatment for MRSA infections.   Culture, blood (Routine X 2) w Reflex to ID Panel     Status: None   Collection Time: 09/24/15  5:37 PM  Result Value Ref Range Status   Specimen Description RIGHT ANTECUBITAL  Final   Special Requests BOTTLES DRAWN AEROBIC AND ANAEROBIC 6CC EACH  Final   Culture NO GROWTH 5 DAYS  Final   Report Status 09/29/2015 FINAL  Final  Culture, blood (Routine X 2) w Reflex to ID Panel     Status: None   Collection Time: 09/24/15   7:05 PM  Result Value Ref Range Status   Specimen Description BLOOD RIGHT HAND  Final   Special Requests BOTTLES DRAWN AEROBIC AND ANAEROBIC 8CC EACH  Final   Culture NO GROWTH 5 DAYS  Final   Report Status 09/29/2015 FINAL  Final  C difficile quick scan w PCR reflex     Status: None   Collection Time: 10/02/15 11:06 PM  Result Value Ref Range Status   C Diff antigen NEGATIVE NEGATIVE Final   C Diff toxin NEGATIVE NEGATIVE Final   C Diff interpretation No C. difficile detected.  Final         Radiology Studies: No results found.      Scheduled Meds: . carvedilol  37.5 mg Oral BID WC  . chlorhexidine  15 mL Mouth Rinse BID  . famotidine (PEPCID) IV  20 mg Intravenous Q24H  . furosemide  40 mg Intravenous BID  . levothyroxine  50 mcg Oral QAC breakfast  . mouth rinse  15 mL Mouth Rinse q12n4p  . rivaroxaban  20 mg Oral Q supper   Continuous Infusions:    LOS: 16 days    Time spent: 25 minutes     Kathie Dike, MD [facility] If 7PM-7AM, please contact night-coverage [email or web address] Password [facility] 10/04/2015, 6:51 AM

## 2015-10-04 NOTE — NC FL2 (Signed)
[place] MEDICAID FL2 LEVEL OF CARE SCREENING TOOL     IDENTIFICATION  Patient Name: [name] [name] Birthdate: 04-Mar-1940 Sex: male Admission Date (Current Location): 09/18/2015  [facility] and [place] Number:  [employer] and Address:  [place] [address], [place]      Provider Number: [id number]  Attending Physician Name and Address:  Kathie Dike, MD  Relative Name and Phone Number:       Current Level of Care: Hospital Recommended Level of Care: [place] Prior Approval Number:    Date Approved/Denied:   PASRR Number: [id number]  Discharge Plan: SNF    Current Diagnoses: Patient Active Problem List   Diagnosis Date Noted  . Protein-calorie malnutrition, severe 10/01/2015  . Hypokalemia 09/22/2015  . Warfarin-induced coagulopathy ([place]) 09/22/2015  . Constipation 09/22/2015  . Hypothyroidism 09/22/2015  . SBO (small bowel obstruction) ([place]) 09/22/2015  . Incarcerated right inguinal hernia 09/22/2015  . Orthostatic syncope 09/18/2015  . Vomiting 09/18/2015  . AKI (acute kidney injury) ([place]) 09/18/2015  . Dehydration 09/18/2015  . Cardiomyopathy ([place])   . Other specified hypothyroidism 12/31/2014  . Hypocalcemia 12/31/2014  . Vitamin D deficiency 12/31/2014  . 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 Essential hypertension 02/11/2013  . CKD (chronic kidney disease) stage 3, GFR 30-59 ml/min 02/11/2013  . UARS (upper airway resistance syndrome) 02/11/2013  . Atrial fibrillation ([place]) 03/15/2012  . Long term current use of anticoagulant therapy 03/15/2012    Orientation RESPIRATION BLADDER Height & Weight     Self, Time, Situation, Place  Normal Continent Weight: 253 lb 15.9 oz (115.2 kg) Height:  6\' 2"  (188 cm)  BEHAVIORAL SYMPTOMS/MOOD NEUROLOGICAL BOWEL NUTRITION STATUS  Other (Comment) (none)  (n/a) Incontinent Diet (Soft diet)  AMBULATORY STATUS COMMUNICATION OF NEEDS Skin    Extensive Assist Verbally Surgical wounds                       Personal Care Assistance Level of Assistance  Bathing, Feeding, Dressing Bathing Assistance: Maximum assistance Feeding assistance: Independent Dressing Assistance: Maximum assistance     Functional Limitations Info  Sight, Hearing, Speech Sight Info: Adequate Hearing Info: Adequate Speech Info: Adequate    SPECIAL CARE FACTORS FREQUENCY  PT (By licensed PT)     PT Frequency: 5              Contractures Contractures Info: Not present    Additional Factors Info  Code Status, Allergies Code Status Info: Full code Allergies Info: No known allergies           Current Medications (10/04/2015):  This is the current hospital active medication list Current Facility-Administered Medications  Medication Dose Route Frequency Provider Last Rate Last Dose  . acetaminophen (TYLENOL) suppository 325 mg  325 mg Rectal Q4H PRN Dionne Milo, NP   325 mg at 09/27/15 1945  . carvedilol (COREG) tablet 37.5 mg  37.5 mg Oral BID WC Kathie Dike, MD   37.5 mg at 10/03/15 1813  . chlorhexidine (PERIDEX) 0.12 % solution 15 mL  15 mL Mouth Rinse BID Aviva Signs, MD   15 mL at 10/03/15 2111  . famotidine (PEPCID) IVPB 20 mg premix  20 mg Intravenous Q24H Rexene Alberts, MD   20 mg at 10/03/15 0954  . furosemide (LASIX) injection 40 mg  40 mg Intravenous BID Kathie Dike, MD   40 mg at 10/03/15 1810  . levothyroxine (SYNTHROID, LEVOTHROID) tablet 50 mcg  50  mcg Oral QAC breakfast Kathie Dike, MD   50 mcg at 10/03/15 0954  . LORazepam (ATIVAN) injection 1 mg  1 mg Intravenous Q4H PRN Aviva Signs, MD   1 mg at 09/27/15 2123  . MEDLINE mouth rinse  15 mL Mouth Rinse q12n4p Aviva Signs, MD   15 mL at 09/30/15 1600  . morphine 4 MG/ML injection 3 mg  3 mg Intravenous Q2H PRN Rexene Alberts, MD   3 mg at 10/02/15 2143  . ondansetron (ZOFRAN) injection 4 mg  4 mg Intravenous Q6H PRN Rexene Alberts, MD      . phenol  (CHLORASEPTIC) mouth spray 1 spray  1 spray Mouth/Throat PRN Rexene Alberts, MD      . rivaroxaban Alveda Reasons) tablet 20 mg  20 mg Oral Q supper Kathie Dike, MD   20 mg at 10/03/15 1810   Facility-Administered Medications Ordered in Other Encounters  Medication Dose Route Frequency Provider Last Rate Last Dose  . neomycin-polymyxin-dexameth (MAXITROL) 0.1 % ophth ointment    PRN [name], MD   1 application at 18/86/77 [phone]     Discharge Medications: Please see discharge summary for a list of discharge medications.  Relevant Imaging Results:  Relevant Lab Results:   Additional Information SSN: [id number]  Salome Arnt, [place]

## 2015-10-04 NOTE — Progress Notes (Signed)
[place] for Rivaroxaban Indication: atrial fibrillation  No Known Allergies  Patient Measurements: Height: 6\' 2"  (188 cm) Weight: 253 lb 15.9 oz (115.2 kg) IBW/kg (Calculated) : 82.2  HEPARIN DW (KG): 105.4  Vital Signs: Temp: 98.5 F (36.9 C) (09/25 0500) Temp Source: Oral (09/25 0500) BP: 122/74 (09/25 0500) Pulse Rate: 100 (09/25 0500)  Labs:  Recent Labs  10/01/15 1824  10/02/15 0454 10/03/15 0648 10/04/15 0602  HGB  --   < > 9.7* 9.9* 10.0*  HCT  --   --  29.9* 30.4* 31.3*  PLT  --   --  348 403* 465*  LABPROT  --   --  20.2*  --   --   INR  --   --  1.70  --   --   HEPARINUNFRC 0.39  --  0.51  --   --   CREATININE  --   --  1.67* 1.71* 1.79*  < > = values in this interval not displayed. Estimated Creatinine Clearance: 48.1 mL/min (by C-G formula based on SCr of 1.79 mg/dL (H)).  Medical History: Past Medical History:  Diagnosis Date  . AF (atrial fibrillation) ([place])   . Cardiomyopathy ([place])    h/o  . Chronic kidney disease, stage 3   . Dysrhythmia   . Hypothyroidism   . Pneumonia   . Prostate cancer ([place])   . Shortness of breath   . Systemic hypertension    Medications:  Prescriptions Prior to Admission  Medication Sig Dispense Refill Last Dose  . Calcium Carbonate-Vitamin D (CALCIUM 600+D) 600-400 MG-UNIT tablet Take 1 tablet by mouth 2 (two) times daily.   09/17/2015 at Unknown time  . carvedilol (COREG) 25 MG tablet Take 37.5 mg by mouth 2 (two) times daily with a meal.   09/17/2015 at 1900  . furosemide (LASIX) 20 MG tablet Take 1 tablet (20 mg total) by mouth daily. (Patient taking differently: Take 20 mg by mouth every other day. ) 30 tablet 0 Past Week at Unknown time  . levothyroxine (SYNTHROID, LEVOTHROID) 50 MCG tablet Take 1 tablet (50 mcg total) by mouth every morning. 30 tablet 6 09/17/2015 at Unknown time  . losartan (COZAAR) 25 MG tablet Take 25 mg by mouth daily.   09/17/2015 at Unknown time  . Vitamin D,  Ergocalciferol, (DRISDOL) 50000 units CAPS capsule Take 50,000 Units by mouth every 30 (thirty) days.   Past Month at Unknown time  . acetaminophen (TYLENOL) 500 MG tablet Take 1,000 mg by mouth every 6 (six) hours as needed for mild pain, moderate pain or headache.   Unknown at Unknown time  . warfarin (COUMADIN) 4 MG tablet Take 4 mg by mouth every evening.   Unknown at Unknown time   Assessment: Rivaroxaban started for non valvular AFIB, CBC stable. Worsening renal function.  Goal of Therapy:  Systemic Anticoagulation. Rivaroxaban for nonvalvular atrial fibrillation   Plan:  Reduce Rivaroxaban (Xarelto) to 15 mg daily for renal function. Monitor CBC, platelets Monitor for bleeding complications  Pricilla Larsson, [facility]  10/04/2015  9:48 AM

## 2015-10-04 NOTE — Clinical Social Work Placement (Signed)
   CLINICAL SOCIAL WORK PLACEMENT  NOTE  Date:  10/04/2015  Patient Details  Name: [name] [name] MRN: [id number] Date of Birth: 1940-06-14  Clinical Social Work is seeking post-discharge placement for this patient at the [place] level of care (*CSW will initial, date and re-position this form in  chart as items are completed):  Yes   Patient/family provided with [facility] Work Department's list of facilities offering this level of care within the geographic area requested by the patient (or if unable, by the patient's family).  Yes   Patient/family informed of their freedom to choose among providers that offer the needed level of care, that participate in Medicare, Medicaid or managed care program needed by the patient, have an available bed and are willing to accept the patient.  Yes   Patient/family informed of [facility]'s ownership interest in [facility] and [facility], as well as of the fact that they are under no obligation to receive care at these facilities.  PASRR submitted to EDS on 10/04/15     PASRR number received on 10/04/15     Existing PASRR number confirmed on       FL2 transmitted to all facilities in geographic area requested by pt/family on 10/04/15     FL2 transmitted to all facilities within larger geographic area on       Patient informed that his/her managed care company has contracts with or will negotiate with certain facilities, including the following:        Yes   Patient/family informed of bed offers received.  Patient [facility] bed at [facility]     Physician recommends and patient chooses bed at      Patient to be transferred to [facility] on  .  Patient to be transferred to facility by       Patient family notified on   of transfer.  Name of family member notified:        PHYSICIAN       Additional Comment:    _______________________________________________ Salome Arnt, LCSW 10/04/2015, 12:17 PM [phone]

## 2015-10-04 NOTE — Discharge Instructions (Addendum)
Open Hernia Repair, Care After These instructions give you information about caring for yourself after your procedure. Your doctor may also give you more specific instructions. Call your doctor if you have any problems or questions after your procedure. HOME CARE  Keep the cut (incision) area clean and dry. You may gently wash the incision area with soap and water 48 hours after surgery. To dry the incision area, gently blot or dab it.  Do not take baths, swim, or use a hot tub for 10 days or until your doctor approves.  Change bandages (dressings) as told by your doctor.  Check your incision area every day for signs of infection. Watch for:  Redness, swelling, or pain.  Fluid , blood, or pus.  Eat plenty of fruits and vegetables. This helps to prevent constipation.  Drink enough fluid to keep your pee (urine) clear or pale yellow. This also helps to prevent constipation.  Do not drive or operate heavy machinery until your doctor says it is okay.  Do not lift anything that is heavier than 10 lb (4.5 kg) until your doctor approves.  Do not play contact sports for 4 weeks or until your doctor approves.  Take medicines only as told by your doctor.  Keep all follow-up visits as told by your doctor. This is important. Ask your doctor when to make an appointment to have your stitches (sutures) or staples removed. GET HELP IF:  The incision is bleeding more than before.  You have blood in your poop (stool).  The incision hurts more than before.  You have redness, swelling, or pain in your incision area.  You have fluid, blood, or pus coming from your incision.  You have a fever.  You notice a bad smell coming from the incision area or the dressing. GET HELP RIGHT AWAY IF:  You have a rash.  Your chest hurts.  You are short of breath.  You feel light-headed.  You feel weak and dizzy (feel faint).   This information is not intended to replace advice given to you by your  health care provider. Make sure you discuss any questions you have with your health care provider.   Document Released: 01/16/2014 Document Reviewed: 01/16/2014 Elsevier Interactive Patient Education 2016 [place] on my medicine - XARELTO (Rivaroxaban)  This medication education was reviewed with me or my healthcare representative as part of my discharge preparation.  The pharmacist that spoke with me during my hospital stay was:  Pricilla Larsson, [facility]  Why was Xarelto prescribed for you? Xarelto was prescribed for you to reduce the risk of a blood clot forming that can cause a stroke if you have a medical condition called atrial fibrillation (a type of irregular heartbeat).  What do you need to know about xarelto ? Take your Xarelto ONCE DAILY at the same time every day with your evening meal. If you have difficulty swallowing the tablet whole, you may crush it and mix in applesauce just prior to taking your dose.  Take Xarelto exactly as prescribed by your doctor and DO NOT stop taking Xarelto without talking to the doctor who prescribed the medication.  Stopping without other stroke prevention medication to take the place of Xarelto may increase your risk of developing a clot that causes a stroke.  Refill your prescription before you run out.  After discharge, you should have regular check-up appointments with your healthcare provider that is prescribing your Xarelto.  In the future your dose may  need to be changed if your kidney function or weight changes by a significant amount.  What do you do if you miss a dose? If you are taking Xarelto ONCE DAILY and you miss a dose, take it as soon as you remember on the same day then continue your regularly scheduled once daily regimen the next day. Do not take two doses of Xarelto at the same time or on the same day.   Important Safety Information A possible side effect of Xarelto is bleeding. You should call your  healthcare provider right away if you experience any of the following: ? Bleeding from an injury or your nose that does not stop. ? Unusual colored urine (red or dark brown) or unusual colored stools (red or black). ? Unusual bruising for unknown reasons. ? A serious fall or if you hit your head (even if there is no bleeding).  Some medicines may interact with Xarelto and might increase your risk of bleeding while on Xarelto. To help avoid this, consult your healthcare provider or pharmacist prior to using any new prescription or non-prescription medications, including herbals, vitamins, non-steroidal anti-inflammatory drugs (NSAIDs) and supplements.  This website has more information on Xarelto: [email or web address]

## 2015-10-04 NOTE — Progress Notes (Signed)
7 Days Post-Op  Subjective: No abdominal or incisional pain.  Bowel movements regular.  Objective: Vital signs in last 24 hours: Temp:  [98.3 F (36.8 C)-98.5 F (36.9 C)] 98.5 F (36.9 C) (09/25 0500) Pulse Rate:  [88-101] 100 (09/25 0500) Resp:  [18-20] 20 (09/25 0500) BP: (116-138)/(73-83) 122/74 (09/25 0500) SpO2:  [96 %-100 %] 96 % (09/25 0500) Weight:  [115.2 kg (253 lb 15.9 oz)] 115.2 kg (253 lb 15.9 oz) (09/25 0500) Last BM Date: 10/03/15  Intake/Output from previous day: 09/24 0701 - 09/25 0700 In: 360 [P.O.:360] Out: 2580 [Urine:2580] Intake/Output this shift: No intake/output data recorded.  General appearance: alert, cooperative and no distress GI: soft, non-tender; bowel sounds normal; no masses,  no organomegaly and incisions healing well.  Lab Results:   Recent Labs  10/03/15 0648 10/04/15 0602  WBC 8.8 9.7  HGB 9.9* 10.0*  HCT 30.4* 31.3*  PLT 403* 465*   BMET  Recent Labs  10/03/15 0648 10/04/15 0602  NA 139 138  K 3.2* 3.5  CL 103 101  CO2 28 28  GLUCOSE 105* 110*  BUN 11 13  CREATININE 1.71* 1.79*  CALCIUM 7.7* 7.5*   PT/INR  Recent Labs  10/02/15 0454  LABPROT 20.2*  INR 1.70    Studies/Results: No results found.  Anti-infectives: Anti-infectives    Start     Dose/Rate Route Frequency Ordered Stop   09/30/15 2000  vancomycin (VANCOCIN) 1,250 mg in sodium chloride 0.9 % 250 mL IVPB  Status:  Discontinued     1,250 mg 166.7 mL/hr over 90 Minutes Intravenous Every 24 hours 09/30/15 1218 10/01/15 0927   09/28/15 1500  vancomycin (VANCOCIN) 1,250 mg in sodium chloride 0.9 % 250 mL IVPB  Status:  Discontinued     1,250 mg 166.7 mL/hr over 90 Minutes Intravenous Every 24 hours 09/27/15 1437 09/30/15 1218   09/27/15 1445  piperacillin-tazobactam (ZOSYN) IVPB 3.375 g  Status:  Discontinued     3.375 g 12.5 mL/hr over 240 Minutes Intravenous Every 8 hours 09/27/15 1443 10/02/15 0918   09/27/15 1445  vancomycin (VANCOCIN) 2,000 mg  in sodium chloride 0.9 % 500 mL IVPB     2,000 mg 250 mL/hr over 120 Minutes Intravenous  Once 09/27/15 1434 09/27/15 1713   09/26/15 0915  ciprofloxacin (CIPRO) IVPB 400 mg  Status:  Discontinued     400 mg 200 mL/hr over 60 Minutes Intravenous Every 12 hours 09/26/15 0914 09/27/15 1426   09/25/15 1030  metroNIDAZOLE (FLAGYL) IVPB 500 mg  Status:  Discontinued     500 mg 100 mL/hr over 60 Minutes Intravenous Every 8 hours 09/25/15 0934 09/27/15 1426   09/19/15 0400  cefTRIAXone (ROCEPHIN) 1 g in dextrose 5 % 50 mL IVPB     1 g 100 mL/hr over 30 Minutes Intravenous Every 24 hours 09/18/15 0522 09/23/15 0441   09/18/15 0415  cefTRIAXone (ROCEPHIN) 1 g in dextrose 5 % 50 mL IVPB     1 g 100 mL/hr over 30 Minutes Intravenous  Once 09/18/15 0406 09/18/15 0457      Assessment/Plan: s/p Procedure(s): RIGHT INGUINAL HERNIORRHAPY WITH MESH SMALL BOWEL RESECTION Imp:  Doing well postoperatively. Has recovered from surgery. We'll follow-up in my office for a wound check on 10/14/2015.  LOS: 16 days    [name] [name] A 10/04/2015

## 2015-10-04 NOTE — Clinical Social Work Placement (Signed)
   CLINICAL SOCIAL WORK PLACEMENT  NOTE  Date:  10/04/2015  Patient Details  Name: [name] [name] MRN: [id number] Date of Birth: 12/16/1940  Clinical Social Work is seeking post-discharge placement for this patient at the [place] level of care (*CSW will initial, date and re-position this form in  chart as items are completed):  Yes   Patient/family provided with [place] Work Department's list of facilities offering this level of care within the geographic area requested by the patient (or if unable, by the patient's family).  Yes   Patient/family informed of their freedom to choose among providers that offer the needed level of care, that participate in Medicare, Medicaid or managed care program needed by the patient, have an available bed and are willing to accept the patient.  Yes   Patient/family informed of [facility]'s ownership interest in [facility] and [facility], as well as of the fact that they are under no obligation to receive care at these facilities.  PASRR submitted to EDS on 10/04/15     PASRR number received on 10/04/15     Existing PASRR number confirmed on       FL2 transmitted to all facilities in geographic area requested by pt/family on 10/04/15     FL2 transmitted to all facilities within larger geographic area on       Patient informed that his/her managed care company has contracts with or will negotiate with certain facilities, including the following:            Patient/family informed of bed offers received.  Patient chooses bed at       Physician recommends and patient chooses bed at      Patient to be transferred to   on  .  Patient to be transferred to facility by       Patient family notified on   of transfer.  Name of family member notified:        PHYSICIAN       Additional Comment:    _______________________________________________ Salome Arnt, [place] 10/04/2015, 9:17  AM [phone]

## 2015-10-04 NOTE — Progress Notes (Signed)
PHARMACIST - PHYSICIAN COMMUNICATION DR:   TRH CONCERNING: IV to Oral Route Change Policy  RECOMMENDATION: This patient is receiving famotidine by the intravenous route.  Based on criteria approved by the Pharmacy and Therapeutics Committee, the intravenous medication(s) is/are being converted to the equivalent oral dose form(s).   DESCRIPTION: These criteria include:  The patient is eating (either orally or via tube) and/or has been taking other orally administered medications for a least 24 hours  The patient has no evidence of active gastrointestinal bleeding or impaired GI absorption (gastrectomy, short bowel, patient on TNA or NPO).  If you have questions about this conversion, please contact the Pharmacy Department  [x]   [phone] )  [facility] []   [phone] )  [facility] []   [phone] )  [facility] []   [phone] )  [facility] []   [phone] )  [place], [facility] 10/04/2015 10:01 AM

## 2015-10-04 NOTE — Care Management Important Message (Signed)
Important Message  Patient Details  Name: [name] [name] MRN: [id number] Date of Birth: [birth year]   Medicare Important Message Given:  Yes    Toba Claudio, [place]un[place]y [place], RN 10/04/2015, 12:57 PM

## 2015-10-05 DIAGNOSIS — E860 Dehydration: Secondary | ICD-10-CM | POA: Diagnosis not present

## 2015-10-05 DIAGNOSIS — N179 Acute kidney failure, unspecified: Secondary | ICD-10-CM | POA: Diagnosis not present

## 2015-10-05 DIAGNOSIS — E876 Hypokalemia: Secondary | ICD-10-CM | POA: Diagnosis not present

## 2015-10-05 DIAGNOSIS — E039 Hypothyroidism, unspecified: Secondary | ICD-10-CM | POA: Diagnosis not present

## 2015-10-05 DIAGNOSIS — I10 Essential (primary) hypertension: Secondary | ICD-10-CM | POA: Diagnosis not present

## 2015-10-05 DIAGNOSIS — I482 Chronic atrial fibrillation: Secondary | ICD-10-CM | POA: Diagnosis not present

## 2015-10-05 DIAGNOSIS — N183 Chronic kidney disease, stage 3 (moderate): Secondary | ICD-10-CM | POA: Diagnosis not present

## 2015-10-05 LAB — BASIC METABOLIC PANEL
ANION GAP: 9 (ref 5–15)
BUN: 14 mg/dL (ref 6–20)
CO2: 28 mmol/L (ref 22–32)
Calcium: 7.5 mg/dL — ABNORMAL LOW (ref 8.9–10.3)
Chloride: 102 mmol/L (ref 101–111)
Creatinine, Ser: 1.76 mg/dL — ABNORMAL HIGH (ref 0.61–1.24)
GFR calc Af Amer: 42 mL/min — ABNORMAL LOW (ref >60)
GFR, EST NON AFRICAN AMERICAN: 36 mL/min — AB (ref >60)
GLUCOSE: 113 mg/dL — AB (ref 65–99)
POTASSIUM: 3.1 mmol/L — AB (ref 3.5–5.1)
Sodium: 139 mmol/L (ref 135–145)

## 2015-10-05 LAB — CBC
HEMATOCRIT: 29.2 % — AB (ref 39.0–52.0)
HEMOGLOBIN: 9.5 g/dL — AB (ref 13.0–17.0)
MCH: 31.7 pg (ref 26.0–34.0)
MCHC: 32.5 g/dL (ref 30.0–36.0)
MCV: 97.3 fL (ref 78.0–100.0)
PLATELETS: 481 10*3/uL — AB (ref 150–400)
RBC: 3 MIL/uL — AB (ref 4.22–5.81)
RDW: 13.1 % (ref 11.5–15.5)
WBC: 10.7 10*3/uL — AB (ref 4.0–10.5)

## 2015-10-05 LAB — MAGNESIUM: Magnesium: 1.4 mg/dL — ABNORMAL LOW (ref 1.7–2.4)

## 2015-10-05 MED ORDER — DILTIAZEM HCL 60 MG PO TABS
60.0000 mg | ORAL_TABLET | Freq: Three times a day (TID) | ORAL | Status: DC
  Administered 2015-10-05 – 2015-10-06 (×3): 60 mg via ORAL
  Filled 2015-10-05 (×3): qty 1

## 2015-10-05 MED ORDER — POTASSIUM CHLORIDE CRYS ER 20 MEQ PO TBCR
40.0000 meq | EXTENDED_RELEASE_TABLET | ORAL | Status: AC
  Administered 2015-10-05 (×3): 40 meq via ORAL
  Filled 2015-10-05 (×3): qty 2

## 2015-10-05 MED ORDER — MAGNESIUM SULFATE 4 GM/100ML IV SOLN
4.0000 g | Freq: Once | INTRAVENOUS | Status: AC
  Administered 2015-10-05: 4 g via INTRAVENOUS
  Filled 2015-10-05: qty 100

## 2015-10-05 NOTE — Progress Notes (Signed)
PROGRESS NOTE    [name] [name]  FGH:[id number] DOB: [birth year] DOA: 09/18/2015 PCP: [name], MD    Brief Narrative: 26 yom with a hx of afib on coumadin, CKD, HTN, hypothyroidism, and cardiomyopathy presented with complaints of nausea and vomiting. Patient had a syncopal episode that lasted a few seconds while attempting to stand. While in the ED, he was noted have acute on chronic renal failure an elevated BUN 95 and creatinine 6.53. He was found to have a strangulated right inguinal hernia and SBO. He had a small bowel resection and repair of the right inguinal hernia on 09/27/15. Renal function has improved with hydration. Hospital course complicated by development of rapid atrial fibrillation. Once heart rate is reasonably controlled, he can likely discharge to SNF.   Assessment & Plan:   Principal Problem:   AKI (acute kidney injury) ([place]) Active Problems:   Atrial fibrillation ([place])   Long term current use of anticoagulant therapy   Essential hypertension   CKD (chronic kidney disease) stage 3, GFR 30-59 ml/min   Orthostatic syncope   Vomiting   Dehydration   Cardiomyopathy (HCC)   Hypokalemia   Warfarin-induced coagulopathy (HCC)   Constipation   Hypothyroidism   SBO (small bowel obstruction) ([place])   Incarcerated right inguinal hernia   Protein-calorie malnutrition, severe  1. Incarcerated-strangulated right inguinal hernia withSBO. CT of abdomen/pelvis revealed high-grade bowel obstruction due to incarcerated right inguinal hernia. Surgery was consulted and preformed partial SBO resection and repair of the right inguinal hernia on 9/18. Follow-up with Dr. Arnoldo Morale on 10/5. Bowel function has returned. He has been advanced to a soft diet and is having bowel movements. Continue supportive management. 2. Hypokalemia. Trended back down. Replace.  3. HTN. Blood pressures are slightly elevated. Continue to monitor. 4. Acute renal failure superimposed on CKD stage  lll. Creatinine was 6.53 on admission. This has improved significantly with IV hydration.Hiscreatinine has been holding steady at around1.79, which is his baseline. Monitor inputand output. Continue to hold ARB.  5. Chronic afib with RVR. Prior to admission, he was anticoagulated with coumadin and rate controlled with coreg. His INR was supratherapeutic at 4.88 on admission. Coreg was initially held and he was started on IV lopressor and cardizem infusion. Heart rate began to improve andhas was restarted on coreg. He is still having bursts of tachycardia. Diltiazem was added, which is being adjusted for better rate control. He is anticoagulated with Xarelto. 6. Hypothyroidism. TSH is within normal limits. Continue synthroid. 7. Hypocalcemia. Calcium 7.5. Albumin is low at 1.8.  8. Anemia. Hemoglobin 9.9. No obvious signs of bleeding.Continue to monitor. 9. Scrotal edema. Likely related to volume resuscitation and low albumin. Appears to be improving with diuresis. Continue lasix. Transition back to home dose of oral lasix on discharge. 10. Fall risk. Physical therapy was consulted and recommended SNF on discharge.   DVT prophylaxis: Xarelto  Code Status: Full  Family Communication: Don at bedside  Disposition Plan: Discharge to skilled nursing facility once improved.    Consultants:   General surgery   Physical therapy   Procedures:   Partial small bowel resection and repair of incarcerated right inguinal hernia 09/27/2015  Antimicrobials:   Vancomycin 9/18 >>9/22   Zosyn 9/18 >>  Cipro 9/17 >> 9/18   Flagyl 9/16 >> 9/18   Rocephin 9/16 >> 9/16   Subjective: No vomiting, no abdominal pain. Having bowel movements. Scrotal edema improving with diuresis  Objective: Vitals:   10/04/15 1438 10/04/15 2033 10/05/15 0007  10/05/15 0458  BP: 127/83 110/63 119/65 115/81  Pulse:  (!) 101 (!) 102 89  Resp: 20 18 19 17   Temp: 98 F (36.7 C) 99 F (37.2 C) 98.3 F (36.8 C)  98.5 F (36.9 C)  TempSrc: Oral Oral Oral Oral  SpO2: 99% 97% 98% 97%  Weight:    114.4 kg (252 lb 3.3 oz)  Height:        Intake/Output Summary (Last 24 hours) at 10/05/15 0653 Last data filed at 10/05/15 0500  Gross per 24 hour  Intake              720 ml  Output             2200 ml  Net            -1480 ml   Filed Weights   10/03/15 0415 10/04/15 0500 10/05/15 0458  Weight: 115.2 kg (253 lb 15.5 oz) 115.2 kg (253 lb 15.9 oz) 114.4 kg (252 lb 3.3 oz)    Examination:  General exam: Appears calm and comfortable  Respiratory system: Clear to auscultation. Respiratory effort normal. Cardiovascular system: S1 & S2 heard, irregular. No JVD, murmurs, rubs, gallops or clicks. 1+ pedal edema. Gastrointestinal system: Abdomen is nondistended, soft and nontender. No organomegaly or masses felt. Normal bowel sounds heard. Central nervous system: Alert and oriented. No focal neurological deficits. Extremities: Symmetric 5 x 5 power. Skin: No rashes, lesions or ulcers Psychiatry: Judgement and insight appear normal. Mood & affect appropriate.     Data Reviewed: I have personally reviewed following labs and imaging studies  CBC:  Recent Labs Lab 09/30/15 0428 10/01/15 0349 10/02/15 0454 10/03/15 0648 10/04/15 0602  WBC 13.8* 11.7* 9.1 8.8 9.7  HGB 10.3* 10.1* 9.7* 9.9* 10.0*  HCT 32.7* 31.5* 29.9* 30.4* 31.3*  MCV 99.7 97.8 97.1 98.1 99.1  PLT 338 355 348 403* 431*   Basic Metabolic Panel:  Recent Labs Lab 09/29/15 0839 09/30/15 0428 10/01/15 0349 10/02/15 0454 10/03/15 0648 10/04/15 0602  NA 140 139 138 136 139 138  K 4.3 3.7 3.9 3.4* 3.2* 3.5  CL 109 104 104 102 103 101  CO2 27 29 27 27 28 28   GLUCOSE 154* 147* 95 106* 105* 110*  BUN 23* 17 14 13 11 13   CREATININE 2.44* 2.02* 1.72* 1.67* 1.71* 1.79*  CALCIUM 7.1* 7.3* 7.4* 7.3* 7.7* 7.5*  MG 1.3*  --  1.6*  --   --   --    GFR: Estimated Creatinine Clearance: 48 mL/min (by C-G formula based on SCr of 1.79  mg/dL (H)). Liver Function Tests:  Recent Labs Lab 10/03/15 0648  AST 20  ALT 16*  ALKPHOS 68  BILITOT 0.7  PROT 5.9*  ALBUMIN 1.8*   No results for input(s): LIPASE, AMYLASE in the last 168 hours. No results for input(s): AMMONIA in the last 168 hours. Coagulation Profile:  Recent Labs Lab 09/29/15 0502 09/30/15 0428 10/01/15 0349 10/02/15 0454  INR 1.69 1.45 1.59 1.70   Cardiac Enzymes: No results for input(s): CKTOTAL, CKMB, CKMBINDEX, TROPONINI in the last 168 hours. BNP (last 3 results) No results for input(s): PROBNP in the last 8760 hours. HbA1C: No results for input(s): HGBA1C in the last 72 hours. CBG:  Recent Labs Lab 09/28/15 2143 10/01/15 2003 10/02/15 0032  GLUCAP 113* 126* 126*   Lipid Profile: No results for input(s): CHOL, HDL, LDLCALC, TRIG, CHOLHDL, LDLDIRECT in the last 72 hours. Thyroid Function Tests: No results for input(s): TSH, T4TOTAL,  FREET4, T3FREE, THYROIDAB in the last 72 hours. Anemia Panel: No results for input(s): VITAMINB12, FOLATE, FERRITIN, TIBC, IRON, RETICCTPCT in the last 72 hours. Sepsis Labs: No results for input(s): PROCALCITON, LATICACIDVEN in the last 168 hours.  Recent Results (from the past 240 hour(s))  C difficile quick scan w PCR reflex     Status: None   Collection Time: 10/02/15 11:06 PM  Result Value Ref Range Status   C Diff antigen NEGATIVE NEGATIVE Final   C Diff toxin NEGATIVE NEGATIVE Final   C Diff interpretation No C. difficile detected.  Final         Radiology Studies: No results found.      Scheduled Meds: . carvedilol  37.5 mg Oral BID WC  . chlorhexidine  15 mL Mouth Rinse BID  . diltiazem  60 mg Oral BID  . famotidine  20 mg Oral Daily  . furosemide  40 mg Intravenous BID  . levothyroxine  50 mcg Oral QAC breakfast  . mouth rinse  15 mL Mouth Rinse q12n4p  . rivaroxaban  15 mg Oral Q supper   Continuous Infusions:    LOS: 17 days    Time spent: 25 minutes      Kathie Dike, MD [facility] If 7PM-7AM, please contact night-coverage [email or web address] Password [email or web address] 10/05/2015, 6:53 AM

## 2015-10-05 NOTE — Progress Notes (Signed)
Physical Therapy Treatment Patient Details Name: [name] [name] MRN: [id number] DOB: February 07, 1940 Today's Date: 10/05/2015    History of Present Illness 75 y.o. male with medical history significant of HTN, CKD baseline cr around 2, afib on coumadin comes in for the second time this week for persistent vomiting.  He came in 2 days ago with cc of vomiting at that time.  He was told to stop taking some of his pills but is not sure which ones.  He went home and continued to vomit.  The vomiting comes right after he experiences suprapubic pain and disfomfort.  He denies any dysuria, no urinaary incontinence.  No hematuria.  No pain above the suprapubic area.  No diarrhea. No constipation.  The pain is not related with eating but is improved after his vomits.  He has had no sick contacts.  Today was very weak and his sons went to check on him and he was too weak to even stand.  They tried to get him up and he passed out when he stood up.  They finally got him into the car to come to the ED.  Pt found to be very dehydrated with worsening renal failure and was referred for admission for such.  09/22/15 CT scan of the abdomen/pelvis was ordered for evaluation of persistent abdominal distention and vomiting. It revealed a high-grade bowel obstruction due to incarcerated right inguinal hernia. His lactic acid level was within normal limits.  NG tube was inserted and it drained a copious amount of green bilious fluid.  Dr. Arnoldo Morale was consulted. He did not believe the patient had evidence of strangulation or gangrenous changes on exam.  Pt is now s/p R inguinal hernorrhapy with mesh.      PT Comments    Pt received in bed, friend present, and pt was agreeable to PT tx.  Pt was able to ambulate 43ft with RW, however, further ambulation was limited due to tachycardia.  See vitals below:   Resting BP: 130/78, HR: 52bpm Max HR during ambulation: 180bpm BP after ambulation: 144/74, HR: 117bpm after seated rest x 2-3  min  Continue to recommend SNF due to pt's decreased endurance, continued need to monitor HR during mobilization, as well as LE weakness and increased risk for falling at this time.     Follow Up Recommendations  SNF     Equipment Recommendations  Other (comment) (TBD)    Recommendations for Other Services       Precautions / Restrictions Precautions Precautions: Fall Precaution Comments: fell 3x's just prior to admission. 1) in the kitchen, 2) oldest son spent the night, and pt had fell off the bed and couldn't get up.  3) The next night, both boys were helping him, and he passed out.   Restrictions Weight Bearing Restrictions: No    Mobility  Bed Mobility Overal bed mobility: Modified Independent Bed Mobility: Rolling;Sidelying to Sit Rolling: Modified independent (Device/Increase time) (vc's for B knee flexion) Sidelying to sit: Modified independent (Device/Increase time) (bed flat )          Transfers Overall transfer level: Needs assistance Equipment used: Rolling walker (2 wheeled) Transfers: Sit to/from Stand Sit to Stand: Min guard            Ambulation/Gait Ambulation/Gait assistance: Min guard Ambulation Distance (Feet): 85 Feet Assistive device: Rolling walker (2 wheeled) Gait Pattern/deviations: Step-through pattern;Trunk flexed     General Gait Details: 2 short standing rest breaks to correct posture, and encourage pt to  slow down.  Pt continues to have unsteadiness in B ankles.  Ambulation distance continues to be limited due to tachycardia with HR up to 180bpm per portable telemetry unit.  Had pt ambulated back to the room, and after about 2-3 min seated rest break, HR was down to 117's.  RN notified, and spoke with Dr. Roderic Palau afterwards also.     Stairs            Wheelchair Mobility    Modified Rankin (Stroke Patients Only)       Balance Overall balance assessment: Needs assistance   Sitting balance-Leahy Scale: Good      Standing balance support: Bilateral upper extremity supported Standing balance-Leahy Scale: Fair                      Cognition Arousal/Alertness: Awake/alert Behavior During Therapy: WFL for tasks assessed/performed Overall Cognitive Status: Within Functional Limits for tasks assessed                      Exercises      General Comments        Pertinent Vitals/Pain Pain Assessment: 0-10 Pain Score: 3  Pain Location: Abdomen Pain Intervention(s): Limited activity within patient's tolerance;Repositioned;Relaxation    Home Living                      Prior Function            PT Goals (current goals can now be found in the care plan section) Acute Rehab PT Goals Patient Stated Goal: Pt wants to get stronger, and wants the pain to go away.  PT Goal Formulation: With patient Time For Goal Achievement: 10/12/15 Potential to Achieve Goals: Good Progress towards PT goals: Progressing toward goals    Frequency    Min 4X/week      PT Plan Current plan remains appropriate    Co-evaluation             End of Session Equipment Utilized During Treatment: Gait belt;Other (comment) (portable telemetry) Activity Tolerance: Treatment limited secondary to medical complications (Comment) (tachycardia with HR up to 180bpm) Patient left: in chair;with call bell/phone within reach;with family/visitor present     Time: 7673-4193 PT Time Calculation (min) (ACUTE ONLY): 20 min  Charges:  $Gait Training: 8-22 mins                    G Codes:      Beth Stephinie Battisti, PT, DPT X: [phone]

## 2015-10-05 NOTE — Clinical Social Work Note (Signed)
Authorization received: [id number]. [place] notified. Anticipate d/c tomorrow per MD. Per [place], authorization will be good through tomorrow.   [name] [name], [place]

## 2015-10-06 ENCOUNTER — Inpatient Hospital Stay
Admission: RE | Admit: 2015-10-06 | Discharge: 2015-10-29 | Disposition: A | Discharge status: Home / Self Care | Payer: PPO | Source: Ambulatory Visit | Attending: Internal Medicine | Admitting: Internal Medicine

## 2015-10-06 ENCOUNTER — Encounter (HOSPITAL_COMMUNITY): Payer: Self-pay | Admitting: General Surgery

## 2015-10-06 DIAGNOSIS — Z48815 Encounter for surgical aftercare following surgery on the digestive system: Secondary | ICD-10-CM | POA: Diagnosis not present

## 2015-10-06 DIAGNOSIS — N179 Acute kidney failure, unspecified: Secondary | ICD-10-CM

## 2015-10-06 DIAGNOSIS — Z9181 History of falling: Secondary | ICD-10-CM | POA: Diagnosis not present

## 2015-10-06 DIAGNOSIS — E43 Unspecified severe protein-calorie malnutrition: Secondary | ICD-10-CM | POA: Diagnosis not present

## 2015-10-06 DIAGNOSIS — N183 Chronic kidney disease, stage 3 (moderate): Secondary | ICD-10-CM | POA: Diagnosis not present

## 2015-10-06 DIAGNOSIS — I482 Chronic atrial fibrillation: Secondary | ICD-10-CM | POA: Diagnosis not present

## 2015-10-06 DIAGNOSIS — E212 Other hyperparathyroidism: Secondary | ICD-10-CM | POA: Diagnosis not present

## 2015-10-06 DIAGNOSIS — E876 Hypokalemia: Secondary | ICD-10-CM | POA: Diagnosis not present

## 2015-10-06 DIAGNOSIS — M6281 Muscle weakness (generalized): Secondary | ICD-10-CM | POA: Diagnosis not present

## 2015-10-06 DIAGNOSIS — K5669 Other intestinal obstruction: Secondary | ICD-10-CM

## 2015-10-06 DIAGNOSIS — K4030 Unilateral inguinal hernia, with obstruction, without gangrene, not specified as recurrent: Principal | ICD-10-CM

## 2015-10-06 DIAGNOSIS — R488 Other symbolic dysfunctions: Secondary | ICD-10-CM | POA: Diagnosis not present

## 2015-10-06 DIAGNOSIS — R293 Abnormal posture: Secondary | ICD-10-CM | POA: Diagnosis not present

## 2015-10-06 DIAGNOSIS — E039 Hypothyroidism, unspecified: Secondary | ICD-10-CM | POA: Diagnosis not present

## 2015-10-06 DIAGNOSIS — I429 Cardiomyopathy, unspecified: Secondary | ICD-10-CM | POA: Diagnosis not present

## 2015-10-06 DIAGNOSIS — I10 Essential (primary) hypertension: Secondary | ICD-10-CM | POA: Diagnosis not present

## 2015-10-06 DIAGNOSIS — K5900 Constipation, unspecified: Secondary | ICD-10-CM | POA: Diagnosis not present

## 2015-10-06 DIAGNOSIS — Z7901 Long term (current) use of anticoagulants: Secondary | ICD-10-CM | POA: Diagnosis not present

## 2015-10-06 DIAGNOSIS — E559 Vitamin D deficiency, unspecified: Secondary | ICD-10-CM | POA: Diagnosis not present

## 2015-10-06 DIAGNOSIS — R262 Difficulty in walking, not elsewhere classified: Secondary | ICD-10-CM | POA: Diagnosis not present

## 2015-10-06 DIAGNOSIS — K5660 Unspecified intestinal obstruction: Secondary | ICD-10-CM | POA: Diagnosis not present

## 2015-10-06 LAB — BASIC METABOLIC PANEL
Anion gap: 7 (ref 5–15)
BUN: 18 mg/dL (ref 6–20)
CALCIUM: 7.4 mg/dL — AB (ref 8.9–10.3)
CO2: 27 mmol/L (ref 22–32)
CREATININE: 1.75 mg/dL — AB (ref 0.61–1.24)
Chloride: 106 mmol/L (ref 101–111)
GFR calc Af Amer: 42 mL/min — ABNORMAL LOW (ref >60)
GFR, EST NON AFRICAN AMERICAN: 36 mL/min — AB (ref >60)
GLUCOSE: 123 mg/dL — AB (ref 65–99)
Potassium: 3.4 mmol/L — ABNORMAL LOW (ref 3.5–5.1)
Sodium: 140 mmol/L (ref 135–145)

## 2015-10-06 LAB — MAGNESIUM: Magnesium: 1.9 mg/dL (ref 1.7–2.4)

## 2015-10-06 MED ORDER — ACETAMINOPHEN 500 MG PO TABS: 500.0000 mg | ORAL_TABLET | Freq: Four times a day (QID) | ORAL | Status: DC | PRN

## 2015-10-06 MED ORDER — RIVAROXABAN 15 MG PO TABS: 15.0000 mg | ORAL_TABLET | Freq: Every day | ORAL | Status: DC

## 2015-10-06 NOTE — Clinical Social Work Placement (Signed)
   CLINICAL SOCIAL WORK PLACEMENT  NOTE  Date:  10/06/2015  Patient Details  Name: [name] [name] MRN: [id number] Date of Birth: 1940/10/25  Clinical Social Work is seeking post-discharge placement for this patient at the [place] level of care (*CSW will initial, date and re-position this form in  chart as items are completed):  Yes   Patient/family provided with [place] Work Department's list of facilities offering this level of care within the geographic area requested by the patient (or if unable, by the patient's family).  Yes   Patient/family informed of their freedom to choose among providers that offer the needed level of care, that participate in Medicare, Medicaid or managed care program needed by the patient, have an available bed and are willing to accept the patient.  Yes   Patient/family informed of [place]'s ownership interest in [facility] and [facility], as well as of the fact that they are under no obligation to receive care at these facilities.  PASRR submitted to EDS on 10/04/15     PASRR number received on 10/04/15     Existing PASRR number confirmed on       FL2 transmitted to all facilities in geographic area requested by pt/family on 10/04/15     FL2 transmitted to all facilities within larger geographic area on       Patient informed that his/her managed care company has contracts with or will negotiate with certain facilities, including the following:        Yes   Patient/family informed of bed offers received.  Patient chooses bed at [facility]     Physician recommends and patient chooses bed at      Patient to be transferred to [facility] on 10/06/15.  Patient to be transferred to facility by staff     Patient family notified on 10/06/15 of transfer.  Name of family member notified:  pt states he has already notified family     PHYSICIAN       Additional Comment:     _______________________________________________ Salome Arnt, LCSW 10/06/2015, 2:36 PM [phone]

## 2015-10-06 NOTE — Progress Notes (Signed)
PROGRESS NOTE  [name] [name] BWG:[id number] DOB: Nov 01, 1940 DOA: 09/18/2015 PCP: [name], MD  Brief Narrative: 75 year-old man presented with nausea, vomiting, generalized weakness, fall with syncope. Admitted for vomiting, dehydration, acute kidney injury, UTI. Renal function improved but he continued to vomit and subsequent CT of the abdomen and pelvis revealed incarcerated right inguinal hernia with resultant small bowel distraction. Because of coagulopathy surgery was delayed. He subsequently underwent partial small bowel resection and repair of incarcerated right inguinal hernia 9/18. Hospitalization was complicated by atrial fibrillation with intermittent rapid ventricular rates and scrotal edema. Physical therapy recommended skilled nursing facility for rehabilitation.  Assessment/Plan: 1. Incarcerated-strangulated right inguinal hernia withSBO. Resolved. Bowel function has returned. Status post surgery 9/18. Cleared for discharge by surgery. Plans for outpatient follow-up have been made. 2. Acute kidney injury superimposed on chronic kidney disease stage III, resolved. Secondary to small bowel obstruction and poor oral intake as an outpatient. 3. Chronic atrial fibrillation with intermittent rapid ventricular response. Currently rate controlled. Continue anticoagulation with Xarelto. 4. Hypothyroidism. TSH within normal limits. Continue levothyroxine. 5. Anemia of acute illness. Stable. 6. Fall risk. Physical therapy was consulted and recommended SNF on discharge.  7. Severe malnutrition    Medically stable for discharge. Continue current medications.   Follow-up with Dr. Arnoldo Morale' office for a wound check on 10/14/2015  Transfer to SNF.  DVT prophylaxis: Xarelto  Code Status: Full  Family Communication: Family bedside Disposition Plan: Discharge to SNF  Murray Hodgkins, MD  [facility] Direct contact: [phone] --Via amion app OR  --[email or web address];  password [email or web address]  7PM-7AM contact night coverage as above 10/06/2015, 7:35 AM  LOS: 18 days   Consultants:  General surgery   Physical therapy   Procedures:  Partial small bowel resection and repair of incarcerated right inguinal hernia 09/27/2015  Antimicrobials:  Vancomycin 9/18 >>9/22   Zosyn 9/18 >>  Cipro 9/17 >>9/18   Flagyl 9/16 >>9/18   Rocephin 9/16 >> 9/16  CC: Follow-up small bowel resection Interval history/Subjective: Feeling well. No overnight problems. 3 BMs last night. Denies any pain, nausea, or vomiting.   ROS:  No shortness of breath or chest pain.   Objective: Vitals:   10/05/15 1320 10/05/15 1816 10/05/15 2143 10/06/15 0530  BP: 120/65 125/82 125/66 118/62  Pulse: (!) 101 98 86 93  Resp: 20 20 20 18   Temp: 97.5 F (36.4 C) 98.9 F (37.2 C) 98.3 F (36.8 C) 99.5 F (37.5 C)  TempSrc: Oral Oral Oral Oral  SpO2: 97% 97% 99% 98%  Weight:    109.8 kg (242 lb 1 oz)  Height:    6\' 2"  (1.88 m)    Intake/Output Summary (Last 24 hours) at 10/06/15 0735 Last data filed at 10/05/15 2144  Gross per 24 hour  Intake              960 ml  Output             1600 ml  Net             -640 ml     Filed Weights   10/04/15 0500 10/05/15 0458 10/06/15 0530  Weight: 115.2 kg (253 lb 15.9 oz) 114.4 kg (252 lb 3.3 oz) 109.8 kg (242 lb 1 oz)    Exam:    Constitutional:  . Appears calm and comfortable  Respiratory:  . CTA bilaterally, no w/r/r.  . Respiratory effort normal. No retractions or accessory muscle use Cardiovascular:  . Irregular, no m/r/g . No  significant edema   . Telemetry Afib    I have personally reviewed following labs and imaging studies:  Basic metabolic panel unremarkable. Creatinine at baseline. Magnesium within normal limits.  Scheduled Meds: . carvedilol  37.5 mg Oral BID WC  . chlorhexidine  15 mL Mouth Rinse BID  . diltiazem  60 mg Oral Q8H  . famotidine  20 mg Oral Daily  . furosemide  40 mg Intravenous BID  .  levothyroxine  50 mcg Oral QAC breakfast  . mouth rinse  15 mL Mouth Rinse q12n4p  . rivaroxaban  15 mg Oral Q supper   Continuous Infusions:   Principal Problem:   AKI (acute kidney injury) ([place]) Active Problems:   Atrial fibrillation ([place])   Long term current use of anticoagulant therapy   Essential hypertension   CKD (chronic kidney disease) stage 3, GFR 30-59 ml/min   Orthostatic syncope   Vomiting   Dehydration   Cardiomyopathy (HCC)   Hypokalemia   Warfarin-induced coagulopathy (HCC)   Constipation   Hypothyroidism   SBO (small bowel obstruction) ([place])   Incarcerated right inguinal hernia   Protein-calorie malnutrition, severe   LOS: 18 days  s     By signing my name below, I, [name] [name], attest that this documentation has been prepared under the direction and in the presence of Murray Hodgkins, MD. Electronically signed: Collene [name], Scribe. 10/06/15 1:00 PM  I personally performed the services described in this documentation. All medical record entries made by the scribe were at my direction. I have reviewed the chart and agree that the record reflects my personal performance and is accurate and complete. Murray Hodgkins, MD

## 2015-10-06 NOTE — Care Management Important Message (Signed)
Important Message  Patient Details  Name: [name] [name] MRN: [id number] Date of Birth: March 30, 1940   Medicare Important Message Given:  Yes    Jaye Saal, Chauncey Reading, RN 10/06/2015, 11:41 AM

## 2015-10-06 NOTE — Discharge Summary (Signed)
Physician Discharge Summary  [name] [name]:[id number] DOB: Oct 29, 1940 DOA: 09/18/2015  PCP: [name], MD  Admit date: 09/18/2015 Discharge date: 10/06/2015  Recommendations for Outpatient Follow-up:  1. Follow-up with Dr. Arnoldo Morale, Follow-up surgery 2. Anticoagulation changed from warfarin to [place], MD. Schedule an appointment as soon as possible for a visit on 10/14/2015.   Specialty:  General Surgery Contact information: [address] [place] [address] [phone]          Discharge Diagnoses:  1. Incarcerated-strangulated right inguinal hernia 2. Small bowel obstruction secondary to incarcerated hernia 3. Acute kidney injury superimposed on chronic kidney disease stage III 4. Chronic atrial fibrillation with intermittent rapid ventricular response 5. Severe malnutrition  Discharge Condition: Stable  Disposition: SNF   Diet recommendation: Heart healthy   Filed Weights   10/04/15 0500 10/05/15 0458 10/06/15 0530  Weight: 115.2 kg (253 lb 15.9 oz) 114.4 kg (252 lb 3.3 oz) 109.8 kg (242 lb 1 oz)    History of present illness:  75 year-old man presented with nausea, vomiting, generalized weakness, fall with syncope. Admitted for vomiting, dehydration, acute kidney injury, UTI.   Hospital Course:  Renal function improved but he continued to vomit and subsequent CT of the abdomen and pelvis revealed incarcerated right inguinal hernia with resultant small bowel distraction. Because of coagulopathy surgery was delayed. He subsequently underwent partial small bowel resection and repair of incarcerated right inguinal hernia 9/18. Hospitalization was complicated by atrial fibrillation with intermittent rapid ventricular rates and scrotal edema. Physical therapy recommended skilled nursing facility for rehabilitation.  1. Incarcerated-strangulated right inguinal hernia withSBO. Resolved. Bowel function has  returned. Status post surgery 9/18. Cleared for discharge by surgery. Plans for outpatient follow-up have been made. 2. Acute kidney injury superimposed on chronic kidney disease stage III, resolved. Secondary to small bowel obstruction and poor oral intake as an outpatient. 3. Chronic atrial fibrillation with intermittent rapid ventricular response. Currently rate controlled. Continue anticoagulation with Xarelto. 4. Hypothyroidism. TSH within normal limits. Continue levothyroxine. 5. Anemia of acute illness. Stable. 6. Fall risk. Physical therapy was consulted and recommended SNF on discharge.  7. Severe malnutrition   Consultants:  General surgery   Physical therapy   Procedures:  Partial small bowel resection and repair of incarcerated right inguinal hernia 09/27/2015  Discharge Instructions     Medication List    STOP taking these medications   warfarin 4 MG tablet Commonly known as:  COUMADIN     TAKE these medications   acetaminophen 500 MG tablet Commonly known as:  TYLENOL Take 1 tablet (500 mg total) by mouth every 6 (six) hours as needed for mild pain or headache. What changed:  how much to take  reasons to take this   CALCIUM 600+D 600-400 MG-UNIT tablet Generic drug:  Calcium Carbonate-Vitamin D Take 1 tablet by mouth 2 (two) times daily.   carvedilol 25 MG tablet Commonly known as:  COREG Take 37.5 mg by mouth 2 (two) times daily with a meal.   furosemide 20 MG tablet Commonly known as:  LASIX Take 1 tablet (20 mg total) by mouth daily. What changed:  when to take this   levothyroxine 50 MCG tablet Commonly known as:  SYNTHROID, LEVOTHROID Take 1 tablet (50 mcg total) by mouth every morning.   losartan 25 MG tablet Commonly known as:  COZAAR Take 25 mg by mouth daily.   Rivaroxaban 15 MG Tabs tablet Commonly known as:  XARELTO Take 1 tablet (15 mg total) by mouth daily with supper.   Vitamin D (Ergocalciferol) 50000 units Caps  capsule Commonly known as:  DRISDOL Take 50,000 Units by mouth every 30 (thirty) days.      No Known Allergies  The results of significant diagnostics from this hospitalization (including imaging, microbiology, ancillary and laboratory) are listed below for reference.    Significant Diagnostic Studies: Ct Abdomen Pelvis Wo Contrast  Result Date: 09/22/2015 CLINICAL DATA:  Nausea, vomiting beginning 3 days ago. EXAM: CT ABDOMEN AND PELVIS WITHOUT CONTRAST TECHNIQUE: Multidetector CT imaging of the abdomen and pelvis was performed following the standard protocol without IV contrast. COMPARISON:  Plain films 09/20/2015 FINDINGS: Lower chest: Linear atelectasis in the right lung base. 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 No pleural effusions. Heart is normal size. Hepatobiliary: No focal hepatic abnormality. Gallbladder unremarkable. Pancreas: No focal abnormality or ductal dilatation. Spleen: No focal abnormality.  Normal size. Adrenals/Urinary Tract: Kidneys are atrophic with cortical thinning and scarring. Numerous punctate bilateral nonobstructing renal stones. No hydronephrosis. Urinary bladder and adrenal glands have an unremarkable unenhanced appearance. Stomach/Bowel: Stomach and proximal to mid small bowel loops are markedly dilated due to incarcerated right inguinal hernia containing small bowel loops. Distal small bowel loops are decompressed. Scattered colonic diverticula. No active diverticulitis. Colon is decompressed. Vascular/Lymphatic: Scattered aortic and iliac calcifications. No aneurysm. No adenopathy. Reproductive: No visible focal abnormality. Other: No free fluid or free air. Musculoskeletal: Degenerative changes in the lower lumbar spine. Sclerotic focus anteriorly within the L4 vertebral body, nonspecific. This may reflect bone island. IMPRESSION: High-grade small bowel  obstruction due to incarcerated right inguinal hernia containing small bowel loops. 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 Bilateral nephrolithiasis. Small kidneys with cortical thinning and scarring. No hydronephrosis. Electronically Signed   By: Rolm Baptise M.D.   On: 09/22/2015 13:04   Dg Abd 1 View  Result Date: 09/22/2015 CLINICAL DATA:  NG tube placement. EXAM: ABDOMEN - 1 VIEW COMPARISON:  CT earlier this day. FINDINGS: The tip and side port of the enteric tube are below the diaphragm in the stomach. Dilated bowel loops on prior CT are fluid-filled and not well seen radiographically. There is no evidence of free air. IMPRESSION: Tip and side port of the enteric tube below the diaphragm in the stomach. Electronically Signed   By: Jeb Levering M.D.   On: 09/22/2015 21:55   US Abdomen Complete  Result Date: 09/18/2015 CLINICAL DATA:  Patient is with vomiting, weakness and near syncopal event. EXAM: ABDOMEN ULTRASOUND COMPLETE COMPARISON:  Abdominal ultrasound (aorta) 07/16/2012 FINDINGS: Gallbladder: No gallstones or wall thickening visualized. No sonographic Murphy sign noted by sonographer. Common bile duct: Diameter: 2 mm Liver: No focal lesion identified. Within normal limits in parenchymal echogenicity. IVC: Not visualized due to overlying bowel gas. Pancreas: Not visualized due to overlying bowel gas. Spleen: Not visualized due to overlying bowel gas. Right Kidney: Length: 11.6 cm. Nodular and atrophic cortex. Increased renal cortical echogenicity. No hydronephrosis. Left Kidney:  Not visualized. Abdominal aorta: Not visualized due to overlying bowel gas. Other findings: None. IMPRESSION: Markedly limited exam given extensive overlying bowel gas. No cholelithiasis or sonographic evidence for acute cholecystitis. Atrophic right kidney with increased cortical echogenicity as can be seen with chronic medical renal  disease. Electronically Signed   By: Lovey Newcomer M.D.   On: 09/18/2015 11:04  Dg Chest Port 1 View  Result Date: 09/24/2015 CLINICAL DATA:  Fever and shortness of breath. EXAM: PORTABLE CHEST 1 VIEW COMPARISON:  09/20/2015 and 02/03/2009 radiographs FINDINGS: The cardiomediastinal silhouette is unchanged. Elevation of the right hemidiaphragm again noted. There is no evidence of focal airspace disease, pulmonary edema, suspicious pulmonary nodule/mass, pleural effusion, or pneumothorax. No acute bony abnormalities are identified. IMPRESSION: No evidence of acute cardiopulmonary disease. Electronically Signed   By: Margarette Canada M.D.   On: 09/24/2015 17:53   Dg Abd Acute W/chest  Result Date: 09/20/2015 CLINICAL DATA:  Vomiting for 1 week EXAM: DG ABDOMEN ACUTE W/ 1V CHEST COMPARISON:  None. FINDINGS: Punctate calcifications project over the upper and lower poles of the left kidney. Probable calcifications projecting over the midpole of the right kidney. No evidence of obstruction, organomegaly or free air. No acute bony abnormality. Slight elevation of the right hemidiaphragm. No confluent opacities in the lungs. No effusions. Heart is normal size. No acute bony abnormality. Advanced degenerative changes in the left hip with moderate degenerative changes in the right hip. IMPRESSION: Probable bilateral nephrolithiasis. No evidence of bowel obstruction or free air. No acute cardiopulmonary disease. Electronically Signed   By: Rolm Baptise M.D.   On: 09/20/2015 09:06    Microbiology: Recent Results (from the past 240 hour(s))  C difficile quick scan w PCR reflex     Status: None   Collection Time: 10/02/15 11:06 PM  Result Value Ref Range Status   C Diff antigen NEGATIVE NEGATIVE Final   C Diff toxin NEGATIVE NEGATIVE Final   C Diff interpretation No C. difficile detected.  Final     Labs: Basic Metabolic Panel:  Recent Labs Lab 10/01/15 0349 10/02/15 0454 10/03/15 1740 10/04/15 0602  10/05/15 0641 10/06/15 0613  NA 138 136 139 138 139 140  K 3.9 3.4* 3.2* 3.5 3.1* 3.4*  CL 104 102 103 101 102 106  CO2 27 27 28 28 28 27   GLUCOSE 95 106* 105* 110* 113* 123*  BUN 14 13 11 13 14 18   CREATININE 1.72* 1.67* 1.71* 1.79* 1.76* 1.75*  CALCIUM 7.4* 7.3* 7.7* 7.5* 7.5* 7.4*  MG 1.6*  --   --   --  1.4* 1.9   Liver Function Tests:  Recent Labs Lab 10/03/15 0648  AST 20  ALT 16*  ALKPHOS 68  BILITOT 0.7  PROT 5.9*  ALBUMIN 1.8*   CBC:  Recent Labs Lab 10/01/15 0349 10/02/15 0454 10/03/15 0648 10/04/15 0602 10/05/15 0641  WBC 11.7* 9.1 8.8 9.7 10.7*  HGB 10.1* 9.7* 9.9* 10.0* 9.5*  HCT 31.5* 29.9* 30.4* 31.3* 29.2*  MCV 97.8 97.1 98.1 99.1 97.3  PLT 355 348 403* 465* 481*    CBG:  Recent Labs Lab 10/01/15 2003 10/02/15 0032  GLUCAP 126* 126*    Principal Problem:   AKI (acute kidney injury) ([place]) Active Problems:   Atrial fibrillation ([place])   Long term current use of anticoagulant therapy   Essential hypertension   CKD (chronic kidney disease) stage 3, GFR 30-59 ml/min   Orthostatic syncope   Vomiting   Dehydration   Cardiomyopathy ([facility])   Hypokalemia   Warfarin-induced coagulopathy ([facility])   Constipation   Hypothyroidism   SBO (small bowel obstruction) ([place])   Incarcerated right inguinal hernia   Protein-calorie malnutrition, severe   Time coordinating discharge: 35 minutes   Signed:  Murray Hodgkins, MD [facility] 10/06/2015, 3:07 PM  By signing my name below, I, Collene Leyden, attest that this  documentation has been prepared under the direction and in the presence of Murray Hodgkins, MD. Electronically signed: Collene Leyden, Scribe. 10/06/15 1:00 PM   I personally performed the services described in this documentation. All medical record entries made by the scribe were at my direction. I have reviewed the chart and agree that the record reflects my personal performance and is accurate and complete. Murray Hodgkins,  MD

## 2015-10-06 NOTE — Care Management Note (Signed)
Case Management Note  Patient Details  Name: SHAIN PAUWELS MRN: [id number] Date of Birth: 1940/12/05    Expected Discharge Date:        10/06/2015          Expected Discharge Plan:  [place]  In-House Referral:  Clinical Social Work  Discharge planning Services  CM Consult  Post Acute Care Choice:  NA Choice offered to:  NA  DME Arranged:    DME Agency:     HH Arranged:    [place] Agency:     Status of Service:  Completed, signed off  If discussed at [employer], dates discussed:    Additional Comments: Patient discharging to [facility] today. CSW making arrangements.  Ariyel Jeangilles, Chauncey [place], RN 10/06/2015, 3:08 PM

## 2015-10-06 NOTE — Progress Notes (Signed)
Patient has discharge orders. Pt's bed is available at [facility]. Called report to Wallace at [facility]. Family is aware that patient is moving, patient is ready for transport.   Celestia Khat, RN

## 2015-10-07 ENCOUNTER — Non-Acute Institutional Stay (SKILLED_NURSING_FACILITY): Payer: PPO | Admitting: Internal Medicine

## 2015-10-07 ENCOUNTER — Encounter: Payer: Self-pay | Admitting: Internal Medicine

## 2015-10-07 DIAGNOSIS — E43 Unspecified severe protein-calorie malnutrition: Secondary | ICD-10-CM | POA: Diagnosis not present

## 2015-10-07 DIAGNOSIS — I482 Chronic atrial fibrillation, unspecified: Secondary | ICD-10-CM

## 2015-10-07 DIAGNOSIS — E039 Hypothyroidism, unspecified: Secondary | ICD-10-CM

## 2015-10-07 DIAGNOSIS — K4030 Unilateral inguinal hernia, with obstruction, without gangrene, not specified as recurrent: Secondary | ICD-10-CM

## 2015-10-07 DIAGNOSIS — Z9889 Other specified postprocedural states: Secondary | ICD-10-CM | POA: Diagnosis not present

## 2015-10-07 DIAGNOSIS — N183 Chronic kidney disease, stage 3 unspecified: Secondary | ICD-10-CM

## 2015-10-07 DIAGNOSIS — Z8719 Personal history of other diseases of the digestive system: Secondary | ICD-10-CM | POA: Diagnosis not present

## 2015-10-07 DIAGNOSIS — I10 Essential (primary) hypertension: Secondary | ICD-10-CM | POA: Diagnosis not present

## 2015-10-07 DIAGNOSIS — I429 Cardiomyopathy, unspecified: Secondary | ICD-10-CM | POA: Diagnosis not present

## 2015-10-07 NOTE — Progress Notes (Signed)
Provider:  [name] Location:   [facility] Room Number: 123/P Place of Service:  SNF (31)  PCP: [name], MD Patient Care Team: [name], MD as PCP - General (Family Medicine) [name], MD as Consulting Physician (Nephrology) [name], MD as Consulting Physician (Urology) [name], MD as Consulting Physician (Endocrinology)  Extended Emergency Contact Information Primary Emergency Contact: [name], [name] [address] [name] of [place] Phone: [phone] Relation: Son Secondary Emergency Contact: [name],[name] Address: [address] [place], [place] [address] [place] Phone: [phone] Relation: Son  Code Status: Full Code Goals of Care: Advanced Directive information Advanced Directives 10/07/2015  Does patient have an advance directive? Yes  Type of Advance Directive (No Data)  Does patient want to make changes to advanced directive? No - Patient declined  Copy of advanced directive(s) in chart? Yes  Pre-existing out of facility DNR order (yellow form or pink MOST form) -      Chief Complaint  Patient presents with  . New Admit To SNF    HPI: Patient is a 75 y.o. male seen today for admission to SNF for Physical therapy. Patient was admitted to hospital with Recurrent vomiting , dehydration and acute renal failure. He was found to have strangulated Right Inguinal hernia and SBO secondary to that. He was Operated By Dr Arnoldo Morale. Surgery had to be delayed due to his Coumadin for Chronic Atrial fibrillation His Post op course was complicated by Rapid Fibrillation and Edema. With Hydration his renal function did improve with Creat came down to his baseline.He was also very Malnourished with Albumin of 1.8. He was found have generalized weakness. He is admitted here for Therapy with Plan for discharge Home with his son.  Past Medical History:    Diagnosis Date  . AF (atrial fibrillation) ([facility])   . Cardiomyopathy ([place])    h/o  . Chronic kidney disease, stage 3   . Dysrhythmia   . Hypothyroidism   . Pneumonia   . Prostate cancer ([place]) Followed By Urologist. In remission.   . Shortness of breath   . Systemic hypertension    Past Surgical History:  Procedure Laterality Date  . CARDIOVERSION    . CATARACT EXTRACTION W/PHACO  04/17/2011   Procedure: CATARACT EXTRACTION PHACO AND INTRAOCULAR LENS PLACEMENT (IOC);  Surgeon: [name], MD;  Location: [facility];  Service: Ophthalmology;  Laterality: Left;  CDE:14.58  . CATARACT EXTRACTION W/PHACO  05/22/2011   Procedure: CATARACT EXTRACTION PHACO AND INTRAOCULAR LENS PLACEMENT (IOC);  Surgeon: [name], MD;  Location: [facility];  Service: Ophthalmology;  Laterality: Right;  CDE 13.02  . COLONOSCOPY  01/17/2011   Procedure: COLONOSCOPY;  Surgeon: Jamesetta So;  Location: [facility];  Service: Gastroenterology;  Laterality: N/A;  . INGUINAL HERNIA REPAIR Right 09/27/2015   Procedure: RIGHT INGUINAL HERNIORRHAPY WITH MESH SMALL BOWEL RESECTION;  Surgeon: Aviva Signs, MD;  Location: [facility];  Service: General;  Laterality: Right;  . NM MYOCAR [place]  02/2009   dipyridamole; mild perfusion defect due to infarct/scar with mild perinfarct ischemia is apical septal, apical basal inferoseptal, basal inferior, mid inferoseptal mid inferior & apical inferior; EKG negative for ischemia; low risk scan  . PROSTATE SURGERY     7 years ago [facility] Dr Javaid-prostatectomy  . PROSTATECTOMY    . Sleep Study  05/2011  increased upper airway resistance syndrome with mild sleep apnea during REM  . TRANSTHORACIC ECHOCARDIOGRAM  04/2011   EF=50-55%; RV mildly dilated; LA mild-mod dilated; mild mitral valve annular calcif, mild MR; mod TR, RVSP elevated 30-76mmHg, mild pulm HTN; AV mildly sclerotic, mild calcif of AV leaflets; trace pulm valve regurg     reports that he has never smoked. He has never used  smokeless tobacco. He reports that he does not drink alcohol or use drugs. Social History   Social History  . Marital status: Single    Spouse name: N/A  . Number of children: 2  . Years of education: N/A   Occupational History  . Not on file.   Social History Main Topics  . Smoking status: Never Smoker  . Smokeless tobacco: Never Used  . Alcohol use No  . Drug use: No  . Sexual activity: Yes    Birth control/ protection: None   Other Topics Concern  . Not on file   Social History Narrative  . No narrative on file    Functional Status Survey:    Family History  Problem Relation Age of Onset  . Diabetes Mother   . Prostate cancer Father   . Anesthesia problems Neg Hx   . Hypotension Neg Hx   . Malignant hyperthermia Neg Hx   . Pseudochol deficiency Neg Hx     Health Maintenance  Topic Date Due  . TETANUS/TDAP  03/01/1959  . ZOSTAVAX  02/29/2000  . PNA vac Low Risk Adult (1 of 2 - PCV13) 02/28/2005  . INFLUENZA VACCINE  12/10/2015 (Originally 08/10/2015)  . COLONOSCOPY  01/16/2021    No Known Allergies    Medication List       Accurate as of 10/07/15 10:36 AM. Always use your most recent med list.          acetaminophen 500 MG tablet Commonly known as:  TYLENOL Take 1 tablet (500 mg total) by mouth every 6 (six) hours as needed for mild pain or headache.   CALCIUM 600+D 600-400 MG-UNIT tablet Generic drug:  Calcium Carbonate-Vitamin D Take 1 tablet by mouth 2 (two) times daily.   carvedilol 25 MG tablet Commonly known as:  COREG Take 37.5 mg by mouth 2 (two) times daily with a meal.   furosemide 20 MG tablet Commonly known as:  LASIX Take 1 tablet (20 mg total) by mouth daily.   levothyroxine 50 MCG tablet Commonly known as:  SYNTHROID, LEVOTHROID Take 1 tablet (50 mcg total) by mouth every morning.   losartan 25 MG tablet Commonly known as:  COZAAR Take 25 mg by mouth daily.   Rivaroxaban 15 MG Tabs tablet Commonly known as:   XARELTO Take 1 tablet (15 mg total) by mouth daily with supper.   Vitamin D (Ergocalciferol) 50000 units Caps capsule Commonly known as:  DRISDOL Take 50,000 Units by mouth every 30 (thirty) days.       Review of Systems  Constitutional: Positive for activity change and fatigue. Negative for appetite change, chills, diaphoresis, fever and unexpected weight change.  Respiratory: Negative for apnea, cough, choking, chest tightness, shortness of breath, wheezing and stridor.   Cardiovascular: Positive for palpitations. Negative for chest pain and leg swelling.  Gastrointestinal: Negative for abdominal distention, abdominal pain, anal bleeding, blood in stool, constipation, diarrhea and nausea.  Musculoskeletal: Positive for arthralgias.    Vitals:   10/07/15 1035  BP: (!) 146/84  Pulse: 68  Resp: 18  Temp: 98.7 F (37.1 C)  TempSrc: Oral  SpO2: 97%   There is no height or weight on file to calculate BMI. Physical Exam  Constitutional: He is oriented to person, place, and time. He is cooperative.  HENT:  Mouth/Throat: Oropharynx is clear and moist.  Poor Dental hygiene with some of his own teeth.  Cardiovascular: Normal heart sounds.  An irregularly irregular rhythm present.  Pulmonary/Chest: Breath sounds normal. No respiratory distress. He has no wheezes. He has no rales. He exhibits no tenderness.  Abdominal: Soft. Bowel sounds are normal. He exhibits no distension. There is no tenderness. There is no rebound and no guarding.  Midline well healing  scar with staples.   Neurological: He is alert and oriented to person, place, and time.  Good Motor strength in all extremities.    Labs reviewed: Basic Metabolic Panel:  Recent Labs  09/28/15 0427  10/01/15 0349  10/04/15 0602 10/05/15 0641 10/06/15 0613  NA 139  < > 138  < > 138 139 140  K 5.5*  < > 3.9  < > 3.5 3.1* 3.4*  CL 111  < > 104  < > 101 102 106  CO2 24  < > 27  < > 28 28 27   GLUCOSE 104*  < > 95  < > 110*  113* 123*  BUN 22*  < > 14  < > 13 14 18   CREATININE 2.71*  < > 1.72*  < > 1.79* 1.76* 1.75*  CALCIUM 7.4*  < > 7.4*  < > 7.5* 7.5* 7.4*  MG 1.3*  < > 1.6*  --   --  1.4* 1.9  PHOS 4.2  --   --   --   --   --   --   < > = values in this interval not displayed. Liver Function Tests:  Recent Labs  09/18/15 0254 09/23/15 0651 10/03/15 0648  AST 24 17 20   ALT 22 17 16*  ALKPHOS 44 44 68  BILITOT 1.2 0.8 0.7  PROT 7.8 6.0* 5.9*  ALBUMIN 4.0 2.6* 1.8*    Recent Labs  09/15/15 1635 09/18/15 0254  LIPASE 32 66*   No results for input(s): AMMONIA in the last 8760 hours. CBC:  Recent Labs  09/27/15 0435  10/03/15 0648 10/04/15 0602 10/05/15 0641  WBC 13.0*  < > 8.8 9.7 10.7*  NEUTROABS 9.3*  --   --   --   --   HGB 10.8*  < > 9.9* 10.0* 9.5*  HCT 34.2*  < > 30.4* 31.3* 29.2*  MCV 101.8*  < > 98.1 99.1 97.3  PLT 337  < > 403* 465* 481*  < > = values in this interval not displayed. Cardiac Enzymes:  Recent Labs  09/15/15 1635  TROPONINI <0.03   BNP: Invalid input(s): POCBNP No results found for: HGBA1C Lab Results  Component Value Date   TSH 1.673 09/23/2015   No results found for: VITAMINB12 No results found for: FOLATE No results found for: IRON, TIBC, FERRITIN  Imaging and Procedures obtained prior to SNF admission: US Abdomen Complete  Result Date: 09/18/2015 CLINICAL DATA:  Patient is with vomiting, weakness and near syncopal event. EXAM: ABDOMEN ULTRASOUND COMPLETE COMPARISON:  Abdominal ultrasound (aorta) 07/16/2012 FINDINGS: Gallbladder: No gallstones or wall thickening visualized. No sonographic Murphy sign noted by sonographer. Common bile duct: Diameter: 2 mm Liver: No focal lesion identified. Within normal limits in parenchymal echogenicity. IVC: Not visualized due to overlying bowel gas. Pancreas: Not visualized due to overlying bowel gas. Spleen:  Not visualized due to overlying bowel gas. Right Kidney: Length: 11.6 cm. Nodular and atrophic cortex.  Increased renal cortical echogenicity. No hydronephrosis. Left Kidney:  Not visualized. Abdominal aorta: Not visualized due to overlying bowel gas. Other findings: None. IMPRESSION: Markedly limited exam given extensive overlying bowel gas. No cholelithiasis or sonographic evidence for acute cholecystitis. Atrophic right kidney with increased cortical echogenicity as can be seen with chronic medical renal disease. Electronically Signed   By: Lovey Newcomer M.D.   On: 09/18/2015 11:04    Assessment/Plan  S/P right inguinal hernia repair Due to Incarcerated right inguinal hernia  Patient is doing well. He is not having Any nausea. Appetite is good. Pain is well controlled and moving his Bowels. Has appointment with Dr Arnoldo Morale for follow up and removal of Staples.   Chronic atrial fibrillation  Patient was complaining that he felt little palpitations when working with Physical therapist. Will increase his Coreg to 50 mg BID which was his usual dose. Continue to monitor Vitals Q shift. Coumadin has been changed to Xarelto.  Essential hypertension  BP well controlled. Slightly increase in Coreg dose. Continue to monitor.  Cardiomyopathy   Last EF in 09/17 was 50%. Patient is on low dose of Lasix.  CKD (chronic kidney disease) stage 3, GFR 30-59 ml/min  Creat was 6.5 with BUN 95 on his admission to hospital. His creat is back to baseline of 1.75. He follows with Nephrologist.  Hypothyroidism His TSH was Normal 1.67 in hospital. Continue same dose of synthroid.  Protein-calorie malnutrition, severe With Albumin of 1.8 Nutrition Consult for supplement. His albumin before hospital stay was 4.0 So his prognosis stays good with improvement of his diet.   Anemia most likely secondary to surgery. Will monitor. It was normal range before  His Hospital admission Hypokalemia Will do Potassium supplement. Follow Potassium.   Patient will continue therapy with plan for discharge home. D/W his  son.     Family/ staff Communication:   Labs/tests ordered:

## 2015-10-10 DIAGNOSIS — I429 Cardiomyopathy, unspecified: Secondary | ICD-10-CM | POA: Diagnosis not present

## 2015-10-10 DIAGNOSIS — K56600 Partial intestinal obstruction, unspecified as to cause: Secondary | ICD-10-CM | POA: Diagnosis not present

## 2015-10-10 DIAGNOSIS — N183 Chronic kidney disease, stage 3 (moderate): Secondary | ICD-10-CM | POA: Diagnosis not present

## 2015-10-10 DIAGNOSIS — M6281 Muscle weakness (generalized): Secondary | ICD-10-CM | POA: Diagnosis not present

## 2015-10-10 DIAGNOSIS — R488 Other symbolic dysfunctions: Secondary | ICD-10-CM | POA: Diagnosis not present

## 2015-10-10 DIAGNOSIS — E559 Vitamin D deficiency, unspecified: Secondary | ICD-10-CM | POA: Diagnosis not present

## 2015-10-10 DIAGNOSIS — Z9181 History of falling: Secondary | ICD-10-CM | POA: Diagnosis not present

## 2015-10-10 DIAGNOSIS — Z48815 Encounter for surgical aftercare following surgery on the digestive system: Secondary | ICD-10-CM | POA: Diagnosis not present

## 2015-10-10 DIAGNOSIS — E876 Hypokalemia: Secondary | ICD-10-CM | POA: Diagnosis not present

## 2015-10-10 DIAGNOSIS — R262 Difficulty in walking, not elsewhere classified: Secondary | ICD-10-CM | POA: Diagnosis not present

## 2015-10-10 DIAGNOSIS — I482 Chronic atrial fibrillation: Secondary | ICD-10-CM | POA: Diagnosis not present

## 2015-10-10 DIAGNOSIS — E212 Other hyperparathyroidism: Secondary | ICD-10-CM | POA: Diagnosis not present

## 2015-10-10 DIAGNOSIS — Z7901 Long term (current) use of anticoagulants: Secondary | ICD-10-CM | POA: Diagnosis not present

## 2015-10-10 DIAGNOSIS — E039 Hypothyroidism, unspecified: Secondary | ICD-10-CM | POA: Diagnosis not present

## 2015-10-10 DIAGNOSIS — R293 Abnormal posture: Secondary | ICD-10-CM | POA: Diagnosis not present

## 2015-10-10 DIAGNOSIS — I10 Essential (primary) hypertension: Secondary | ICD-10-CM | POA: Diagnosis not present

## 2015-10-10 DIAGNOSIS — E43 Unspecified severe protein-calorie malnutrition: Secondary | ICD-10-CM | POA: Diagnosis not present

## 2015-10-11 ENCOUNTER — Encounter: Payer: Self-pay | Admitting: Internal Medicine

## 2015-10-11 ENCOUNTER — Non-Acute Institutional Stay (SKILLED_NURSING_FACILITY): Payer: PPO | Admitting: Internal Medicine

## 2015-10-11 DIAGNOSIS — T814XXA Infection following a procedure, initial encounter: Secondary | ICD-10-CM | POA: Diagnosis not present

## 2015-10-11 DIAGNOSIS — I10 Essential (primary) hypertension: Secondary | ICD-10-CM

## 2015-10-11 DIAGNOSIS — I482 Chronic atrial fibrillation, unspecified: Secondary | ICD-10-CM

## 2015-10-11 DIAGNOSIS — I951 Orthostatic hypotension: Secondary | ICD-10-CM | POA: Diagnosis not present

## 2015-10-11 DIAGNOSIS — R21 Rash and other nonspecific skin eruption: Secondary | ICD-10-CM | POA: Diagnosis not present

## 2015-10-11 DIAGNOSIS — IMO0001 Reserved for inherently not codable concepts without codable children: Secondary | ICD-10-CM

## 2015-10-11 NOTE — Progress Notes (Signed)
Location:   [facility] Room Number: 123/P Place of Service:  SNF (31) Provider:  Gertie Gowda, MD  Patient Care Team: [name], MD as PCP - General (Family Medicine) [name], MD as Consulting Physician (Nephrology) [name], MD as Consulting Physician (Urology) [name], MD as Consulting Physician (Endocrinology)  Extended Emergency Contact Information Primary Emergency Contact: [place], [place] [address] [place] of [place] Phone: [phone] Relation: Son Secondary Emergency Contact: Ontiveros,[name] Address: [address] [place], [place] [address] [place] Phone: [phone] Relation: Son  Code Status:  Full Code Goals of care: Advanced Directive information Advanced Directives 10/11/2015  Does patient have an advance directive? Yes  Type of Advance Directive (No Data)  Does patient want to make changes to advanced directive? No - Patient declined  Copy of advanced directive(s) in chart? Yes  Pre-existing out of facility DNR order (yellow form or pink MOST form) -     Chief Complaint  Patient presents with  . Acute Visit    Orthostatic Hypotension    HPI:  Pt is a 75 y.o. male seen today for an acute visit for Orthostatic Hypotension. Patient had been c/o feeling weak and heavy headed when he sits and stand up. His BP were checked numerous tome in Physical therapy. He was found to have significant drop from systolic 539/76 sitting to 77/55 Standing. Patient denied dizziness or passing out. He said he has felt like this at home also and is use to it. He also have drainage from his wound which seems to be worse from last few days. Also has noticed some redness around the incision. No pain.   Past Medical History:  Diagnosis Date  . AF (atrial fibrillation) ([place])   . Cardiomyopathy ([place])    h/o  . Chronic kidney disease, stage 3   .  Dysrhythmia   . Hypothyroidism   . Pneumonia   . Prostate cancer ([place])   . Shortness of breath   . Systemic hypertension    Past Surgical History:  Procedure Laterality Date  . CARDIOVERSION    . CATARACT EXTRACTION W/PHACO  04/17/2011   Procedure: CATARACT EXTRACTION PHACO AND INTRAOCULAR LENS PLACEMENT (IOC);  Surgeon: [name], MD;  Location: [facility];  Service: Ophthalmology;  Laterality: Left;  CDE:14.58  . CATARACT EXTRACTION W/PHACO  05/22/2011   Procedure: CATARACT EXTRACTION PHACO AND INTRAOCULAR LENS PLACEMENT (IOC);  Surgeon: [name], MD;  Location: [facility];  Service: Ophthalmology;  Laterality: Right;  CDE 13.02  . COLONOSCOPY  01/17/2011   Procedure: COLONOSCOPY;  Surgeon: Jamesetta So;  Location: [facility];  Service: Gastroenterology;  Laterality: N/A;  . INGUINAL HERNIA REPAIR Right 09/27/2015   Procedure: RIGHT INGUINAL HERNIORRHAPY WITH MESH SMALL BOWEL RESECTION;  Surgeon: Aviva Signs, MD;  Location: [facility];  Service: General;  Laterality: Right;  . NM MYOCAR [place]  02/2009   dipyridamole; mild perfusion defect due to infarct/scar with mild perinfarct ischemia is apical septal, apical basal inferoseptal, basal inferior, mid inferoseptal mid inferior & apical inferior; EKG negative for ischemia; low risk scan  . PROSTATE SURGERY     7 years ago [facility] Dr Javaid-prostatectomy  . PROSTATECTOMY    . Sleep Study  05/2011   increased upper airway resistance syndrome with mild sleep apnea during REM  . TRANSTHORACIC ECHOCARDIOGRAM  04/2011   EF=50-55%; RV mildly dilated; LA mild-mod dilated; mild mitral valve annular calcif, mild MR; mod TR, RVSP elevated 30-54mmHg, mild pulm HTN; AV mildly sclerotic, mild calcif of AV leaflets; trace pulm valve regurg     No Known Allergies  Current Facility-Administered Medications on File Prior to Visit  Medication Dose Route Frequency Provider Last Rate Last Dose  . neomycin-polymyxin-dexameth (MAXITROL) 0.1 % ophth ointment     PRN [name], MD   1 application at 81/01/75 [phone]   Current Outpatient Prescriptions on File Prior to Visit  Medication Sig Dispense Refill  . acetaminophen (TYLENOL) 500 MG tablet Take 1 tablet (500 mg total) by mouth every 6 (six) hours as needed for mild pain or headache.    . Calcium Carbonate-Vitamin D (CALCIUM 600+D) 600-400 MG-UNIT tablet Take 1 tablet by mouth 2 (two) times daily.    . carvedilol (COREG) 25 MG tablet Take 50 mg by mouth 2 (two) times daily with a meal.     . furosemide (LASIX) 20 MG tablet Take 1 tablet (20 mg total) by mouth daily. 30 tablet 0  . levothyroxine (SYNTHROID, LEVOTHROID) 50 MCG tablet Take 1 tablet (50 mcg total) by mouth every morning. 30 tablet 6  . losartan (COZAAR) 25 MG tablet Take 25 mg by mouth daily.    . Rivaroxaban (XARELTO) 15 MG TABS tablet Take 1 tablet (15 mg total) by mouth daily with supper.    . Vitamin D, Ergocalciferol, (DRISDOL) 50000 units CAPS capsule Take 50,000 Units by mouth every 30 (thirty) days.      Review of Systems  Constitutional: Positive for fatigue. Negative for appetite change, chills, diaphoresis and fever.  HENT: Negative.   Respiratory: Negative.   Cardiovascular: Negative.   Gastrointestinal: Negative.   Genitourinary: Negative.   Neurological: Positive for light-headedness. Negative for syncope.     There is no immunization history on file for this patient. Pertinent  Health Maintenance Due  Topic Date Due  . PNA vac Low Risk Adult (1 of 2 - PCV13) 02/28/2005  . INFLUENZA VACCINE  12/10/2015 (Originally 08/10/2015)  . COLONOSCOPY  01/16/2021   Fall Risk  07/02/2015 12/31/2014  Falls in the past year? No No   Functional Status Survey:    Vitals:   10/11/15 1523  BP: (!) 77/55   There is no height or weight on file to calculate BMI. Physical Exam  Constitutional: He appears well-developed and well-nourished.  HENT:  Head: Normocephalic.  Mouth/Throat: Oropharynx is clear and moist.    Cardiovascular: Normal rate.  An irregular rhythm present.  Pulmonary/Chest: Breath sounds normal. No respiratory distress. He has no wheezes. He has no rales.  Abdominal: Soft. Bowel sounds are normal. He exhibits no distension. There is no tenderness. There is no rebound.  Redness around the incision. With discharge.  Skin:  Redness and rash in groin area.    Labs reviewed:  Recent Labs  09/28/15 0427  10/01/15 0349  10/04/15 0602 10/05/15 0641 10/06/15 0613  NA 139  < > 138  < > 138 139 140  K 5.5*  < > 3.9  < > 3.5 3.1* 3.4*  CL 111  < > 104  < > 101 102 106  CO2 24  < > 27  < > 28 28 27   GLUCOSE 104*  < > 95  < > 110* 113* 123*  BUN 22*  < > 14  < > 13 14 18   CREATININE 2.71*  < > 1.72*  < >  1.79* 1.76* 1.75*  CALCIUM 7.4*  < > 7.4*  < > 7.5* 7.5* 7.4*  MG 1.3*  < > 1.6*  --   --  1.4* 1.9  PHOS 4.2  --   --   --   --   --   --   < > = values in this interval not displayed.  Recent Labs  09/18/15 0254 09/23/15 0651 10/03/15 0648  AST 24 17 20   ALT 22 17 16*  ALKPHOS 44 44 68  BILITOT 1.2 0.8 0.7  PROT 7.8 6.0* 5.9*  ALBUMIN 4.0 2.6* 1.8*    Recent Labs  09/27/15 0435  10/03/15 0648 10/04/15 0602 10/05/15 0641  WBC 13.0*  < > 8.8 9.7 10.7*  NEUTROABS 9.3*  --   --   --   --   HGB 10.8*  < > 9.9* 10.0* 9.5*  HCT 34.2*  < > 30.4* 31.3* 29.2*  MCV 101.8*  < > 98.1 99.1 97.3  PLT 337  < > 403* 465* 481*  < > = values in this interval not displayed. Lab Results  Component Value Date   TSH 1.673 09/23/2015   No results found for: HGBA1C Lab Results  Component Value Date   CHOL  02/04/2009    142        ATP III CLASSIFICATION:  <200     mg/dL   Desirable  200-239  mg/dL   Borderline High  >=240    mg/dL   High          HDL 33 (L) 02/04/2009   LDLCALC  02/04/2009    85        Total Cholesterol/HDL:CHD Risk Coronary Heart Disease Risk Table                     Men   Women  1/2 Average Risk   3.4   3.3  Average Risk       5.0   4.4  2 X Average  Risk   9.6   7.1  3 X Average Risk  23.4   11.0        Use the calculated Patient Ratio above and the CHD Risk Table to determine the patient's CHD Risk.        ATP III CLASSIFICATION (LDL):  <100     mg/dL   Optimal  100-129  mg/dL   Near or Above                    Optimal  130-159  mg/dL   Borderline  160-189  mg/dL   High  >190     mg/dL   Very High   TRIG 122 02/04/2009   CHOLHDL 4.3 02/04/2009    Significant Diagnostic Results in last 30 days:  Ct Abdomen Pelvis Wo Contrast  Result Date: 09/22/2015 CLINICAL DATA:  Nausea, vomiting beginning 3 days ago. EXAM: CT ABDOMEN AND PELVIS WITHOUT CONTRAST TECHNIQUE: Multidetector CT imaging of the abdomen and pelvis was performed following the standard protocol without IV contrast. COMPARISON:  Plain films 09/20/2015 FINDINGS: Lower chest: Linear atelectasis in the right lung base. 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 No pleural effusions. Heart is normal size. Hepatobiliary: No focal hepatic abnormality. Gallbladder unremarkable. Pancreas: No focal abnormality or ductal dilatation. Spleen: No focal abnormality.  Normal size. Adrenals/Urinary Tract: Kidneys are atrophic with cortical thinning and scarring. Numerous punctate  bilateral nonobstructing renal stones. No hydronephrosis. Urinary bladder and adrenal glands have an unremarkable unenhanced appearance. Stomach/Bowel: Stomach and proximal to mid small bowel loops are markedly dilated due to incarcerated right inguinal hernia containing small bowel loops. Distal small bowel loops are decompressed. Scattered colonic diverticula. No active diverticulitis. Colon is decompressed. Vascular/Lymphatic: Scattered aortic and iliac calcifications. No aneurysm. No adenopathy. Reproductive: No visible focal abnormality. Other: No free fluid or free air. Musculoskeletal: Degenerative changes in the  lower lumbar spine. Sclerotic focus anteriorly within the L4 vertebral body, nonspecific. This may reflect bone island. IMPRESSION: High-grade small bowel obstruction due to incarcerated right inguinal hernia containing small bowel loops. 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 Bilateral nephrolithiasis. Small kidneys with cortical thinning and scarring. No hydronephrosis. Electronically Signed   By: Rolm Baptise M.D.   On: 09/22/2015 13:04   Dg Abd 1 View  Result Date: 09/22/2015 CLINICAL DATA:  NG tube placement. EXAM: ABDOMEN - 1 VIEW COMPARISON:  CT earlier this day. FINDINGS: The tip and side port of the enteric tube are below the diaphragm in the stomach. Dilated bowel loops on prior CT are fluid-filled and not well seen radiographically. There is no evidence of free air. IMPRESSION: Tip and side port of the enteric tube below the diaphragm in the stomach. Electronically Signed   By: Jeb Levering M.D.   On: 09/22/2015 21:55   US Abdomen Complete  Result Date: 09/18/2015 CLINICAL DATA:  Patient is with vomiting, weakness and near syncopal event. EXAM: ABDOMEN ULTRASOUND COMPLETE COMPARISON:  Abdominal ultrasound (aorta) 07/16/2012 FINDINGS: Gallbladder: No gallstones or wall thickening visualized. No sonographic Murphy sign noted by sonographer. Common bile duct: Diameter: 2 mm Liver: No focal lesion identified. Within normal limits in parenchymal echogenicity. IVC: Not visualized due to overlying bowel gas. Pancreas: Not visualized due to overlying bowel gas. Spleen: Not visualized due to overlying bowel gas. Right Kidney: Length: 11.6 cm. Nodular and atrophic cortex. Increased renal cortical echogenicity. No hydronephrosis. Left Kidney:  Not visualized. Abdominal aorta: Not visualized due to overlying bowel gas. Other findings: None. IMPRESSION: Markedly limited exam given extensive overlying bowel gas. No cholelithiasis  or sonographic evidence for acute cholecystitis. Atrophic right kidney with increased cortical echogenicity as can be seen with chronic medical renal disease. Electronically Signed   By: Lovey Newcomer M.D.   On: 09/18/2015 11:04   Dg Chest Port 1 View  Result Date: 09/24/2015 CLINICAL DATA:  Fever and shortness of breath. EXAM: PORTABLE CHEST 1 VIEW COMPARISON:  09/20/2015 and 02/03/2009 radiographs FINDINGS: The cardiomediastinal silhouette is unchanged. Elevation of the right hemidiaphragm again noted. There is no evidence of focal airspace disease, pulmonary edema, suspicious pulmonary nodule/mass, pleural effusion, or pneumothorax. No acute bony abnormalities are identified. IMPRESSION: No evidence of acute cardiopulmonary disease. Electronically Signed   By: Margarette Canada M.D.   On: 09/24/2015 17:53   Dg Abd Acute W/chest  Result Date: 09/20/2015 CLINICAL DATA:  Vomiting for 1 week EXAM: DG ABDOMEN ACUTE W/ 1V CHEST COMPARISON:  None. FINDINGS: Punctate calcifications project over the upper and lower poles of the left kidney. Probable calcifications projecting over the midpole of the right kidney. No evidence of obstruction, organomegaly or free air. No acute bony abnormality. Slight elevation of the right hemidiaphragm. No confluent opacities in the lungs. No effusions. Heart is normal size. No acute bony abnormality. Advanced degenerative changes in the left hip with moderate degenerative changes in the right hip. IMPRESSION:  Probable bilateral nephrolithiasis. No evidence of bowel obstruction or free air. No acute cardiopulmonary disease. Electronically Signed   By: Rolm Baptise M.D.   On: 09/20/2015 09:06    Assessment/Plan  Postural Hypotension Symptomatic  His Coreg was increased to his home dose in the facility due to palpitations. Will decrease the dose to 25 MG PO BID Also will hold Losartan for now. C Continue to monitor. Also will check CBC and BMP to R/O Dehydration and  anemia.  Possible infection of surgical wound  Start on Keflex 250 MG TID for 7 days Staples will be removed on 5th by Dr Arnoldo Morale.  Fungal rash in groin  Lotrimin BID   Family/ staff Communication:   Labs/tests ordered:

## 2015-10-12 ENCOUNTER — Encounter (HOSPITAL_COMMUNITY)
Admission: RE | Admit: 2015-10-12 | Discharge: 2015-10-12 | Disposition: A | Discharge status: Home / Self Care | Payer: PPO | Source: Skilled Nursing Facility | Attending: Internal Medicine | Admitting: Internal Medicine

## 2015-10-12 LAB — BASIC METABOLIC PANEL
ANION GAP: 9 (ref 5–15)
BUN: 23 mg/dL — ABNORMAL HIGH (ref 6–20)
CALCIUM: 8.3 mg/dL — AB (ref 8.9–10.3)
CO2: 25 mmol/L (ref 22–32)
Chloride: 103 mmol/L (ref 101–111)
Creatinine, Ser: 1.71 mg/dL — ABNORMAL HIGH (ref 0.61–1.24)
GFR calc Af Amer: 43 mL/min — ABNORMAL LOW (ref >60)
GFR calc non Af Amer: 37 mL/min — ABNORMAL LOW (ref >60)
GLUCOSE: 107 mg/dL — AB (ref 65–99)
Potassium: 3.8 mmol/L (ref 3.5–5.1)
Sodium: 137 mmol/L (ref 135–145)

## 2015-10-12 LAB — CBC WITH DIFFERENTIAL/PLATELET
BASOS ABS: 0.1 10*3/uL (ref 0.0–0.1)
Basophils Relative: 1 %
Eosinophils Absolute: 0.4 10*3/uL (ref 0.0–0.7)
Eosinophils Relative: 4 %
HEMATOCRIT: 28.9 % — AB (ref 39.0–52.0)
Hemoglobin: 9.3 g/dL — ABNORMAL LOW (ref 13.0–17.0)
LYMPHS PCT: 33 %
Lymphs Abs: 3.1 10*3/uL (ref 0.7–4.0)
MCH: 31.2 pg (ref 26.0–34.0)
MCHC: 32.2 g/dL (ref 30.0–36.0)
MCV: 97 fL (ref 78.0–100.0)
MONO ABS: 1.2 10*3/uL — AB (ref 0.1–1.0)
MONOS PCT: 13 %
NEUTROS ABS: 4.7 10*3/uL (ref 1.7–7.7)
Neutrophils Relative %: 49 %
Platelets: 512 10*3/uL — ABNORMAL HIGH (ref 150–400)
RBC: 2.98 MIL/uL — ABNORMAL LOW (ref 4.22–5.81)
RDW: 13 % (ref 11.5–15.5)
WBC: 9.5 10*3/uL (ref 4.0–10.5)

## 2015-10-13 ENCOUNTER — Non-Acute Institutional Stay (SKILLED_NURSING_FACILITY): Payer: PPO | Admitting: Internal Medicine

## 2015-10-13 ENCOUNTER — Encounter: Payer: Self-pay | Admitting: Internal Medicine

## 2015-10-13 ENCOUNTER — Other Ambulatory Visit (HOSPITAL_COMMUNITY)
Admission: AD | Admit: 2015-10-13 | Discharge: 2015-10-13 | Disposition: A | Discharge status: Home / Self Care | Payer: PPO | Source: Skilled Nursing Facility | Attending: Internal Medicine | Admitting: Internal Medicine

## 2015-10-13 DIAGNOSIS — N183 Chronic kidney disease, stage 3 unspecified: Secondary | ICD-10-CM

## 2015-10-13 DIAGNOSIS — I482 Chronic atrial fibrillation, unspecified: Secondary | ICD-10-CM

## 2015-10-13 DIAGNOSIS — I951 Orthostatic hypotension: Secondary | ICD-10-CM | POA: Diagnosis not present

## 2015-10-13 DIAGNOSIS — K4030 Unilateral inguinal hernia, with obstruction, without gangrene, not specified as recurrent: Secondary | ICD-10-CM

## 2015-10-13 NOTE — Progress Notes (Signed)
Location:   [facility] Room Number: 123/P Place of Service:  SNF (31) Provider:  Leana Roe, MD  Patient Care Team: [name], MD as PCP - General (Family Medicine) [name], MD as Consulting Physician (Nephrology) [name], MD as Consulting Physician (Urology) [name], MD as Consulting Physician (Endocrinology)  Extended Emergency Contact Information Primary Emergency Contact: [place], [place] [address] [place] Phone: [phone] Relation: Son Secondary Emergency Contact: Lovering,[name] Address: [address] [place], [place] [address] [place] Phone: [phone] Relation: Son  Code Status:  Full Code Goals of care: Advanced Directive information Advanced Directives 10/13/2015  Does patient have an advance directive? Yes  Type of Advance Directive (No Data)  Does patient want to make changes to advanced directive? No - Patient declined  Copy of advanced directive(s) in chart? Yes  Pre-existing out of facility DNR order (yellow form or pink MOST form) -     Chief Complaint  Patient presents with  . Acute Visit  Follow-up hypotension  HPI:  Pt is a 75 y.o. male seen today for an acute visit for follow-up of hypertension thought to be orthostatic.  Patient had been feeling weak and heavy headed when he sits up and stands-blood pressure checked earlier this week and physical therapy for found to had drops from systolic of 476 down to 77 when he stood up.  Patient says this is not totally new that he's had this intermittently at home as well.  Dr.  Lyndel Safe did assess him earlier this week and changed some of his medications including holding his Cozaar's and reducing his metoprolol dose 925 mg twice a day from 37.5 mg twice a day-he does have a history of atrial fibrillation.  He does also is on Lasix with a history of systolic CHF  with most recent echo showing an ejection fraction of approximately 50%.  I have reviewed his weights and they appear to be trending down he's lost approximately 4-5 pounds since his admission-edema appears to be stable he is not complaining of any increased shortness of breath.  Nursing did speak with his son and apparently previously had been on Lasix every other day rather than every day he is currently receiving.  Currently he is lying in bed comfortably I did check his blood pressure manually and got 108/70 lying as well as sitting-apparently in therapy when standing he did have significant drops   Past Medical History:  Diagnosis Date  . AF (atrial fibrillation) ([facility])   . Cardiomyopathy ([place])    h/o  . Chronic kidney disease, stage 3   . Dysrhythmia   . Hypothyroidism   . Pneumonia   . Prostate cancer ([facility])   . Shortness of breath   . Systemic hypertension    Past Surgical History:  Procedure Laterality Date  . CARDIOVERSION    . CATARACT EXTRACTION W/PHACO  04/17/2011   Procedure: CATARACT EXTRACTION PHACO AND INTRAOCULAR LENS PLACEMENT (IOC);  Surgeon: [name], MD;  Location: [facility];  Service: Ophthalmology;  Laterality: Left;  CDE:14.58  . CATARACT EXTRACTION W/PHACO  05/22/2011   Procedure: CATARACT EXTRACTION PHACO AND INTRAOCULAR LENS PLACEMENT (IOC);  Surgeon: [name], MD;  Location: [facility];  Service: Ophthalmology;  Laterality: Right;  CDE 13.02  . COLONOSCOPY  01/17/2011   Procedure: COLONOSCOPY;  Surgeon: Elta Guadeloupe  A Jenkins;  Location: [facility];  Service: Gastroenterology;  Laterality: N/A;  . INGUINAL HERNIA REPAIR Right 09/27/2015   Procedure: RIGHT INGUINAL HERNIORRHAPY WITH MESH SMALL BOWEL RESECTION;  Surgeon: Aviva Signs, MD;  Location: [facility];  Service: General;  Laterality: Right;  . NM MYOCAR [place]  02/2009   dipyridamole; mild perfusion defect due to infarct/scar with mild perinfarct ischemia is apical septal, apical basal inferoseptal, basal  inferior, mid inferoseptal mid inferior & apical inferior; EKG negative for ischemia; low risk scan  . PROSTATE SURGERY     7 years ago [facility] Dr Javaid-prostatectomy  . PROSTATECTOMY    . Sleep Study  05/2011   increased upper airway resistance syndrome with mild sleep apnea during REM  . TRANSTHORACIC ECHOCARDIOGRAM  04/2011   EF=50-55%; RV mildly dilated; LA mild-mod dilated; mild mitral valve annular calcif, mild MR; mod TR, RVSP elevated 30-33mmHg, mild pulm HTN; AV mildly sclerotic, mild calcif of AV leaflets; trace pulm valve regurg     No Known Allergies  Current Facility-Administered Medications on File Prior to Visit  Medication Dose Route Frequency Provider Last Rate Last Dose  . neomycin-polymyxin-dexameth (MAXITROL) 0.1 % ophth ointment    PRN [name], MD   1 application at 62/83/66 [phone]   Current Outpatient Prescriptions on File Prior to Visit  Medication Sig Dispense Refill  . acetaminophen (TYLENOL) 500 MG tablet Take 1 tablet (500 mg total) by mouth every 6 (six) hours as needed for mild pain or headache.    . Calcium Carbonate-Vitamin D (CALCIUM 600+D) 600-400 MG-UNIT tablet Take 1 tablet by mouth 2 (two) times daily.    . carvedilol (COREG) 25 MG tablet Take 25 mg by mouth 2 (two) times daily with a meal.     . furosemide (LASIX) 20 MG tablet Take 1 tablet (20 mg total) by mouth daily. 30 tablet 0  . levothyroxine (SYNTHROID, LEVOTHROID) 50 MCG tablet Take 1 tablet (50 mcg total) by mouth every morning. 30 tablet 6  . potassium chloride (MICRO-K) 10 MEQ CR capsule Take 10 mEq by mouth daily. Stop date 10/15/15    . Rivaroxaban (XARELTO) 15 MG TABS tablet Take 1 tablet (15 mg total) by mouth daily with supper.    . Vitamin D, Ergocalciferol, (DRISDOL) 50000 units CAPS capsule Take 50,000 Units by mouth every 30 (thirty) days.       Review of Systems   Constitutional: Positive for fatigue. Negative for appetite change, chills, diaphoresis and fever.  HENT: Negative.    does not complain of visual changes or headache Respiratory: Negative does not complain of increased shortness of breath or cough.   Cardiovascular: Negative. Does not complain of chest pain edema appears to be stable possibly somewhat reduced   Gastrointestinal: Negative.   or abdominal discomfort nausea vomiting diarrhea or constipation Genitourinary: Negative.   Neurological: Positive for light-headedness when standing.       There is no immunization history on file for this patient. Pertinent  Health Maintenance Due  Topic Date Due  . PNA vac Low Risk Adult (1 of 2 - PCV13) 02/28/2005  . INFLUENZA VACCINE  12/10/2015 (Originally 08/10/2015)  . COLONOSCOPY  01/16/2021   Fall Risk  07/02/2015 12/31/2014  Falls in the past year? No No   Functional Status Survey:    Vitals:   10/13/15 1434  BP: 121/68  Pulse: (!) 106  Resp: 19  Temp: 98 F (36.7 C)  TempSrc: Oral  SpO2: 99%  Of note  pulse on exam was in the low 90s when checked for 1 minute  Blood pressure was 108/70 while still lying as well as sitting There is no height or weight on file to calculate BMI. Physical Exam   Constitutional: He appears well-developed and well-nourished. Lying comfortably in bed HENT:  Head: Normocephalic.  Mouth/Throat: Oropharynx is clear and moist.  Cardiovascular: Normal rate. In the low 90s  An irregular rhythm present. I would say trace 1+ edema Pulmonary/Chest: Breath sounds normal. No respiratory distress. He has no wheezes. He has no rales.  Abdominal: Soft. Bowel sounds are normal. He exhibits no distension. There is no tenderness. There is no rebound.   incision site status post hernia surgery-about half the staples have been removed there is some mild erythema around proximally half the site although this appears to be pale and fairly benign at this point .     Labs reviewed:  Recent Labs  09/28/15 0427  10/01/15 0349  10/05/15 0641 10/06/15 0613 10/12/15 0730  NA 139   < > 138  < > 139 140 137  K 5.5*  < > 3.9  < > 3.1* 3.4* 3.8  CL 111  < > 104  < > 102 106 103  CO2 24  < > 27  < > 28 27 25   GLUCOSE 104*  < > 95  < > 113* 123* 107*  BUN 22*  < > 14  < > 14 18 23*  CREATININE 2.71*  < > 1.72*  < > 1.76* 1.75* 1.71*  CALCIUM 7.4*  < > 7.4*  < > 7.5* 7.4* 8.3*  MG 1.3*  < > 1.6*  --  1.4* 1.9  --   PHOS 4.2  --   --   --   --   --   --   < > = values in this interval not displayed.  Recent Labs  09/18/15 0254 09/23/15 0651 10/03/15 0648  AST 24 17 20   ALT 22 17 16*  ALKPHOS 44 44 68  BILITOT 1.2 0.8 0.7  PROT 7.8 6.0* 5.9*  ALBUMIN 4.0 2.6* 1.8*    Recent Labs  09/27/15 0435  10/04/15 0602 10/05/15 0641 10/12/15 0730  WBC 13.0*  < > 9.7 10.7* 9.5  NEUTROABS 9.3*  --   --   --  4.7  HGB 10.8*  < > 10.0* 9.5* 9.3*  HCT 34.2*  < > 31.3* 29.2* 28.9*  MCV 101.8*  < > 99.1 97.3 97.0  PLT 337  < > 465* 481* 512*  < > = values in this interval not displayed. Lab Results  Component Value Date   TSH 1.673 09/23/2015   No results found for: HGBA1C Lab Results  Component Value Date   CHOL  02/04/2009    142        ATP III CLASSIFICATION:  <200     mg/dL   Desirable  200-239  mg/dL   Borderline High  >=240    mg/dL   High          HDL 33 (L) 02/04/2009   LDLCALC  02/04/2009    85        Total Cholesterol/HDL:CHD Risk Coronary Heart Disease Risk Table                     Men   Women  1/2 Average Risk   3.4   3.3  Average Risk       5.0  4.4  2 X Average Risk   9.6   7.1  3 X Average Risk  23.4   11.0        Use the calculated Patient Ratio above and the CHD Risk Table to determine the patient's CHD Risk.        ATP III CLASSIFICATION (LDL):  <100     mg/dL   Optimal  100-129  mg/dL   Near or Above                    Optimal  130-159  mg/dL   Borderline  160-189  mg/dL   High  >190     mg/dL   Very High   TRIG 122 02/04/2009   CHOLHDL 4.3 02/04/2009    Significant Diagnostic Results in last 30 days:  Ct Abdomen Pelvis  Wo Contrast  Result Date: 09/22/2015 CLINICAL DATA:  Nausea, vomiting beginning 3 days ago. EXAM: CT ABDOMEN AND PELVIS WITHOUT CONTRAST TECHNIQUE: Multidetector CT imaging of the abdomen and pelvis was performed following the standard protocol without IV contrast. COMPARISON:  Plain films 09/20/2015 FINDINGS: Lower chest: Linear atelectasis in the right lung base. 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 No pleural effusions. Heart is normal size. Hepatobiliary: No focal hepatic abnormality. Gallbladder unremarkable. Pancreas: No focal abnormality or ductal dilatation. Spleen: No focal abnormality.  Normal size. Adrenals/Urinary Tract: Kidneys are atrophic with cortical thinning and scarring. Numerous punctate bilateral nonobstructing renal stones. No hydronephrosis. Urinary bladder and adrenal glands have an unremarkable unenhanced appearance. Stomach/Bowel: Stomach and proximal to mid small bowel loops are markedly dilated due to incarcerated right inguinal hernia containing small bowel loops. Distal small bowel loops are decompressed. Scattered colonic diverticula. No active diverticulitis. Colon is decompressed. Vascular/Lymphatic: Scattered aortic and iliac calcifications. No aneurysm. No adenopathy. Reproductive: No visible focal abnormality. Other: No free fluid or free air. Musculoskeletal: Degenerative changes in the lower lumbar spine. Sclerotic focus anteriorly within the L4 vertebral body, nonspecific. This may reflect bone island. IMPRESSION: High-grade small bowel obstruction due to incarcerated right inguinal hernia containing small bowel loops. 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 Bilateral nephrolithiasis. Small kidneys with cortical thinning and scarring. No hydronephrosis. Electronically Signed   By: Rolm Baptise  M.D.   On: 09/22/2015 13:04   Dg Abd 1 View  Result Date: 09/22/2015 CLINICAL DATA:  NG tube placement. EXAM: ABDOMEN - 1 VIEW COMPARISON:  CT earlier this day. FINDINGS: The tip and side port of the enteric tube are below the diaphragm in the stomach. Dilated bowel loops on prior CT are fluid-filled and not well seen radiographically. There is no evidence of free air. IMPRESSION: Tip and side port of the enteric tube below the diaphragm in the stomach. Electronically Signed   By: Jeb Levering M.D.   On: 09/22/2015 21:55   US Abdomen Complete  Result Date: 09/18/2015 CLINICAL DATA:  Patient is with vomiting, weakness and near syncopal event. EXAM: ABDOMEN ULTRASOUND COMPLETE COMPARISON:  Abdominal ultrasound (aorta) 07/16/2012 FINDINGS: Gallbladder: No gallstones or wall thickening visualized. No sonographic Murphy sign noted by sonographer. Common bile duct: Diameter: 2 mm Liver: No focal lesion identified. Within normal limits in parenchymal echogenicity. IVC: Not visualized due to overlying bowel gas. Pancreas: Not visualized due to overlying bowel gas. Spleen: Not visualized due to overlying bowel gas. Right Kidney: Length: 11.6 cm. Nodular and atrophic  cortex. Increased renal cortical echogenicity. No hydronephrosis. Left Kidney:  Not visualized. Abdominal aorta: Not visualized due to overlying bowel gas. Other findings: None. IMPRESSION: Markedly limited exam given extensive overlying bowel gas. No cholelithiasis or sonographic evidence for acute cholecystitis. Atrophic right kidney with increased cortical echogenicity as can be seen with chronic medical renal disease. Electronically Signed   By: Lovey Newcomer M.D.   On: 09/18/2015 11:04   Dg Chest Port 1 View  Result Date: 09/24/2015 CLINICAL DATA:  Fever and shortness of breath. EXAM: PORTABLE CHEST 1 VIEW COMPARISON:  09/20/2015 and 02/03/2009 radiographs FINDINGS: The cardiomediastinal silhouette is unchanged. Elevation of the right  hemidiaphragm again noted. There is no evidence of focal airspace disease, pulmonary edema, suspicious pulmonary nodule/mass, pleural effusion, or pneumothorax. No acute bony abnormalities are identified. IMPRESSION: No evidence of acute cardiopulmonary disease. Electronically Signed   By: Margarette Canada M.D.   On: 09/24/2015 17:53   Dg Abd Acute W/chest  Result Date: 09/20/2015 CLINICAL DATA:  Vomiting for 1 week EXAM: DG ABDOMEN ACUTE W/ 1V CHEST COMPARISON:  None. FINDINGS: Punctate calcifications project over the upper and lower poles of the left kidney. Probable calcifications projecting over the midpole of the right kidney. No evidence of obstruction, organomegaly or free air. No acute bony abnormality. Slight elevation of the right hemidiaphragm. No confluent opacities in the lungs. No effusions. Heart is normal size. No acute bony abnormality. Advanced degenerative changes in the left hip with moderate degenerative changes in the right hip. IMPRESSION: Probable bilateral nephrolithiasis. No evidence of bowel obstruction or free air. No acute cardiopulmonary disease. Electronically Signed   By: Rolm Baptise M.D.   On: 09/20/2015 09:06    Assessment/Plan . #1 symptomatic hypotension-again medication changes have been made by Dr. Lyndel Safe previously including decreasing his Coreg down to 25 mg by mouth twice a day-his losartan is on hold for now--.  Labs were ordered to rule out dehydration and renal function appears to be stable at 1.71 with a history of chronic kidney disease electrolytes are within normal range sodium 137-he appears to be stable when he is lying down and actually blood pressure sitting today appeared to be fairly stable.  However continues apparently to have symptomatic hypotension when in therapy-.  Nursing previously discussed patient's previous meds with son today-apparently he had been on Lasix every other day previously since his weight is stable and actually going down  somewhat edema appears to be stable CHF at this point appears compensated will decrease his Lasix to 20 mg every other day and also will make his potassium every other day-continue to monitor weights and clinical status closely.  Will update a BMP first laboratory day next week  #2 suspected infection of surgical wound status post hernia surgery-he has been started on Keflex this appears to be essentially resolved-he actually was seen by the surgeon Dr. Arnoldo Morale today and some staples were removed--.  #3 anemia thought to be likely secondary to surgery hemoglobin appears fairly stable with postoperative level but this will have to be monitored will recheck this first laboratory day next week as well-.  #4 atrial fibrillation-it appears heart rate is controlled despite the reduction Coreg although I do see a listed reading of 106 earlier today-this will have to be monitored since Coreg was recently decreased-again this is a balancing situation with patient's hypotension--clinically appears to be stable in this regard  [place], [place], [place]

## 2015-10-14 ENCOUNTER — Non-Acute Institutional Stay (SKILLED_NURSING_FACILITY): Payer: PPO | Admitting: Internal Medicine

## 2015-10-14 ENCOUNTER — Other Ambulatory Visit (HOSPITAL_COMMUNITY)
Admission: AD | Admit: 2015-10-14 | Discharge: 2015-10-14 | Disposition: A | Discharge status: Home / Self Care | Payer: PPO | Source: Skilled Nursing Facility | Attending: Internal Medicine | Admitting: Internal Medicine

## 2015-10-14 ENCOUNTER — Encounter: Payer: Self-pay | Admitting: Internal Medicine

## 2015-10-14 DIAGNOSIS — N183 Chronic kidney disease, stage 3 unspecified: Secondary | ICD-10-CM

## 2015-10-14 DIAGNOSIS — I482 Chronic atrial fibrillation, unspecified: Secondary | ICD-10-CM

## 2015-10-14 DIAGNOSIS — I951 Orthostatic hypotension: Secondary | ICD-10-CM

## 2015-10-14 DIAGNOSIS — K56600 Partial intestinal obstruction, unspecified as to cause: Secondary | ICD-10-CM | POA: Diagnosis not present

## 2015-10-14 DIAGNOSIS — E039 Hypothyroidism, unspecified: Secondary | ICD-10-CM | POA: Insufficient documentation

## 2015-10-14 DIAGNOSIS — I429 Cardiomyopathy, unspecified: Secondary | ICD-10-CM

## 2015-10-14 LAB — BASIC METABOLIC PANEL
ANION GAP: 7 (ref 5–15)
BUN: 28 mg/dL — ABNORMAL HIGH (ref 6–20)
CALCIUM: 8.3 mg/dL — AB (ref 8.9–10.3)
CO2: 27 mmol/L (ref 22–32)
CREATININE: 1.92 mg/dL — AB (ref 0.61–1.24)
Chloride: 103 mmol/L (ref 101–111)
GFR calc non Af Amer: 32 mL/min — ABNORMAL LOW (ref >60)
GFR, EST AFRICAN AMERICAN: 38 mL/min — AB (ref >60)
Glucose, Bld: 106 mg/dL — ABNORMAL HIGH (ref 65–99)
Potassium: 3.8 mmol/L (ref 3.5–5.1)
SODIUM: 137 mmol/L (ref 135–145)

## 2015-10-14 LAB — CBC
HCT: 28.5 % — ABNORMAL LOW (ref 39.0–52.0)
HEMOGLOBIN: 9 g/dL — AB (ref 13.0–17.0)
MCH: 30.6 pg (ref 26.0–34.0)
MCHC: 31.6 g/dL (ref 30.0–36.0)
MCV: 96.9 fL (ref 78.0–100.0)
Platelets: 456 10*3/uL — ABNORMAL HIGH (ref 150–400)
RBC: 2.94 MIL/uL — ABNORMAL LOW (ref 4.22–5.81)
RDW: 12.9 % (ref 11.5–15.5)
WBC: 9.5 10*3/uL (ref 4.0–10.5)

## 2015-10-14 NOTE — Progress Notes (Signed)
Location:   [place] Room Number: 123/P Place of Service:  SNF (31) Provider:  Gertie Gowda, MD  Patient Care Team: [name], MD as PCP - General (Family Medicine) [name], MD as Consulting Physician (Nephrology) [name], MD as Consulting Physician (Urology) [name], MD as Consulting Physician (Endocrinology)  Extended Emergency Contact Information Primary Emergency Contact: [place], [place] [address] Johnnette Litter of [place] Phone: [phone] Relation: Son Secondary Emergency Contact: Theys,[name] Address: [address] [place], [place] [address] [place] of [place] Phone: [phone] Relation: Son  Code Status:  Full Code Goals of care: Advanced Directive information Advanced Directives 10/14/2015  Does patient have an advance directive? Yes  Type of Advance Directive (No Data)  Does patient want to make changes to advanced directive? No - Patient declined  Copy of advanced directive(s) in chart? Yes  Pre-existing out of facility DNR order (yellow form or pink MOST form) -     Chief Complaint  Patient presents with  . Acute Visit    Hypotension    HPI:  Pt is a 75 y.o. male seen today for an acute visit for Continuous to have Postural Hypotension with Dizziness. Patient was going for therapy this morning and  C/o weakness on standing up. Was found to have BP sitting 120/74 which dropped to 90/60 on standing up. He did go for therapy and says did good. His Cozaar was reduced to 25 mg BID and Lasix was made every other day. But patient continues to have weakness and dizziness on standing up. He denies any chest pain or SOB. His palpitations are better too. He has lost 4-5 lbs recently. He was also started on Keflex by me for possible Surgical incision infection.Recent Echo showed EF of 55 %.   Past Medical History:  Diagnosis Date  . AF (atrial  fibrillation) ([place])   . Cardiomyopathy ([place])    h/o  . Chronic kidney disease, stage 3   . Dysrhythmia   . Hypothyroidism   . Pneumonia   . Prostate cancer ([place])   . Shortness of breath   . Systemic hypertension    Past Surgical History:  Procedure Laterality Date  . CARDIOVERSION    . CATARACT EXTRACTION W/PHACO  04/17/2011   Procedure: CATARACT EXTRACTION PHACO AND INTRAOCULAR LENS PLACEMENT (IOC);  Surgeon: [name], MD;  Location: [facility];  Service: Ophthalmology;  Laterality: Left;  CDE:14.58  . CATARACT EXTRACTION W/PHACO  05/22/2011   Procedure: CATARACT EXTRACTION PHACO AND INTRAOCULAR LENS PLACEMENT (IOC);  Surgeon: [name], MD;  Location: [facility];  Service: Ophthalmology;  Laterality: Right;  CDE 13.02  . COLONOSCOPY  01/17/2011   Procedure: COLONOSCOPY;  Surgeon: Jamesetta So;  Location: [facility];  Service: Gastroenterology;  Laterality: N/A;  . INGUINAL HERNIA REPAIR Right 09/27/2015   Procedure: RIGHT INGUINAL HERNIORRHAPY WITH MESH SMALL BOWEL RESECTION;  Surgeon: Aviva Signs, MD;  Location: [facility];  Service: General;  Laterality: Right;  . NM MYOCAR [place]  02/2009   dipyridamole; mild perfusion defect due to infarct/scar with mild perinfarct ischemia is apical septal, apical basal inferoseptal, basal inferior, mid inferoseptal mid inferior & apical inferior; EKG negative for ischemia; low risk scan  . PROSTATE SURGERY     7 years ago [facility] Dr Javaid-prostatectomy  . PROSTATECTOMY    . Sleep Study  05/2011   increased upper airway resistance syndrome with mild sleep apnea during REM  . TRANSTHORACIC ECHOCARDIOGRAM  04/2011   EF=50-55%; RV mildly dilated; LA mild-mod dilated; mild mitral valve annular calcif, mild MR; mod TR, RVSP elevated 30-20mmHg, mild pulm HTN; AV mildly sclerotic, mild calcif of AV leaflets; trace pulm valve regurg     No Known Allergies  Current Facility-Administered Medications on File Prior to Visit  Medication Dose Route Frequency  Provider Last Rate Last Dose  . neomycin-polymyxin-dexameth (MAXITROL) 0.1 % ophth ointment    PRN [name], MD   1 application at 29/92/42 [phone]   Current Outpatient Prescriptions on File Prior to Visit  Medication Sig Dispense Refill  . acetaminophen (TYLENOL) 500 MG tablet Take 1 tablet (500 mg total) by mouth every 6 (six) hours as needed for mild pain or headache.    Roseanne Kaufman [place]-Castor Oil (VENELEX) OINT Apply to sacrum and bilateral buttocks q shifts prn for blanche every shift    . Calcium Carbonate-Vitamin D (CALCIUM 600+D) 600-400 MG-UNIT tablet Take 1 tablet by mouth 2 (two) times daily.    . carvedilol (COREG) 25 MG tablet Take 25 mg by mouth 2 (two) times daily with a meal.     . cephALEXin (KEFLEX) 250 MG capsule Take by mouth 3 (three) times daily.    . clotrimazole (LOTRIMIN) 1 % cream Apply 1 application topically 2 (two) times daily.    Marland Kitchen levothyroxine (SYNTHROID, LEVOTHROID) 50 MCG tablet Take 1 tablet (50 mcg total) by mouth every morning. 30 tablet 6  . potassium chloride (MICRO-K) 10 MEQ CR capsule Take 10 mEq by mouth daily. Give every other day Stop date 10/15/15    . Rivaroxaban (XARELTO) 15 MG TABS tablet Take 1 tablet (15 mg total) by mouth daily with supper.    . Vitamin D, Ergocalciferol, (DRISDOL) 50000 units CAPS capsule Take 50,000 Units by mouth every 30 (thirty) days.      Review of Systems  Constitutional: Negative for activity change, appetite change, chills, diaphoresis, fatigue and fever.  Respiratory: Negative for apnea, cough, choking, chest tightness and shortness of breath.   Cardiovascular: Negative for chest pain, palpitations and leg swelling.  Gastrointestinal: Negative for abdominal distention, abdominal pain, constipation, diarrhea and nausea.  Musculoskeletal: Negative.   Skin: Negative for pallor.  Neurological: Positive for dizziness, weakness and light-headedness. Negative for tremors, syncope and numbness.     There is no immunization  history on file for this patient. Pertinent  Health Maintenance Due  Topic Date Due  . PNA vac Low Risk Adult (1 of 2 - PCV13) 02/28/2005  . INFLUENZA VACCINE  12/10/2015 (Originally 08/10/2015)  . COLONOSCOPY  01/16/2021   Fall Risk  07/02/2015 12/31/2014  Falls in the past year? No No   Functional Status Survey:    Vitals:   10/14/15  Pulse: (!) 113  Resp: 20  Temp: 98.1 F (36.7 C)  TempSrc: Oral   There is no height or weight on file to calculate BMI. Physical Exam  Constitutional: He is oriented to person, place, and time. He appears well-developed and well-nourished.  HENT:  Head: Normocephalic.  Mouth/Throat: Oropharynx is clear and moist.  Cardiovascular: Normal heart sounds.  An irregular rhythm present.  Pulmonary/Chest: Breath sounds normal. No respiratory distress. He has no wheezes. He has no rales.  Abdominal: Soft. Bowel sounds are normal. He exhibits no distension. There is no tenderness. There is no rebound.  Redness is decreased around the Incision. Some of  the staples were removed.  Musculoskeletal: He exhibits no edema.  Neurological: He is alert and oriented to person, place, and time.    Labs reviewed:  Recent Labs  09/28/15 0427  10/01/15 0349  10/05/15 0641 10/06/15 0613 10/12/15 0730 10/14/15 0700  NA 139  < > 138  < > 139 140 137 137  K 5.5*  < > 3.9  < > 3.1* 3.4* 3.8 3.8  CL 111  < > 104  < > 102 106 103 103  CO2 24  < > 27  < > 28 27 25 27   GLUCOSE 104*  < > 95  < > 113* 123* 107* 106*  BUN 22*  < > 14  < > 14 18 23* 28*  CREATININE 2.71*  < > 1.72*  < > 1.76* 1.75* 1.71* 1.92*  CALCIUM 7.4*  < > 7.4*  < > 7.5* 7.4* 8.3* 8.3*  MG 1.3*  < > 1.6*  --  1.4* 1.9  --   --   PHOS 4.2  --   --   --   --   --   --   --   < > = values in this interval not displayed.  Recent Labs  09/18/15 0254 09/23/15 0651 10/03/15 0648  AST 24 17 20   ALT 22 17 16*  ALKPHOS 44 44 68  BILITOT 1.2 0.8 0.7  PROT 7.8 6.0* 5.9*  ALBUMIN 4.0 2.6* 1.8*     Recent Labs  09/27/15 0435  10/05/15 0641 10/12/15 0730 10/14/15 0700  WBC 13.0*  < > 10.7* 9.5 9.5  NEUTROABS 9.3*  --   --  4.7  --   HGB 10.8*  < > 9.5* 9.3* 9.0*  HCT 34.2*  < > 29.2* 28.9* 28.5*  MCV 101.8*  < > 97.3 97.0 96.9  PLT 337  < > 481* 512* 456*  < > = values in this interval not displayed. Lab Results  Component Value Date   TSH 1.673 09/23/2015   No results found for: HGBA1C Lab Results  Component Value Date   CHOL  02/04/2009    142        ATP III CLASSIFICATION:  <200     mg/dL   Desirable  200-239  mg/dL   Borderline High  >=240    mg/dL   High          HDL 33 (L) 02/04/2009   LDLCALC  02/04/2009    85        Total Cholesterol/HDL:CHD Risk Coronary Heart Disease Risk Table                     Men   Women  1/2 Average Risk   3.4   3.3  Average Risk       5.0   4.4  2 X Average Risk   9.6   7.1  3 X Average Risk  23.4   11.0        Use the calculated Patient Ratio above and the CHD Risk Table to determine the patient's CHD Risk.        ATP III CLASSIFICATION (LDL):  <100     mg/dL   Optimal  100-129  mg/dL   Near or Above                    Optimal  130-159  mg/dL   Borderline  160-189  mg/dL   High  >190  mg/dL   Very High   TRIG 122 02/04/2009   CHOLHDL 4.3 02/04/2009    Significant Diagnostic Results in last 30 days:  Ct Abdomen Pelvis Wo Contrast  Result Date: 09/22/2015 CLINICAL DATA:  Nausea, vomiting beginning 3 days ago. EXAM: CT ABDOMEN AND PELVIS WITHOUT CONTRAST TECHNIQUE: Multidetector CT imaging of the abdomen and pelvis was performed following the standard protocol without IV contrast. COMPARISON:  Plain films 09/20/2015 FINDINGS: Lower chest: Linear atelectasis in the right lung base. 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 No pleural effusions. Heart is normal size. Hepatobiliary: No focal hepatic abnormality.  Gallbladder unremarkable. Pancreas: No focal abnormality or ductal dilatation. Spleen: No focal abnormality.  Normal size. Adrenals/Urinary Tract: Kidneys are atrophic with cortical thinning and scarring. Numerous punctate bilateral nonobstructing renal stones. No hydronephrosis. Urinary bladder and adrenal glands have an unremarkable unenhanced appearance. Stomach/Bowel: Stomach and proximal to mid small bowel loops are markedly dilated due to incarcerated right inguinal hernia containing small bowel loops. Distal small bowel loops are decompressed. Scattered colonic diverticula. No active diverticulitis. Colon is decompressed. Vascular/Lymphatic: Scattered aortic and iliac calcifications. No aneurysm. No adenopathy. Reproductive: No visible focal abnormality. Other: No free fluid or free air. Musculoskeletal: Degenerative changes in the lower lumbar spine. Sclerotic focus anteriorly within the L4 vertebral body, nonspecific. This may reflect bone island. IMPRESSION: High-grade small bowel obstruction due to incarcerated right inguinal hernia containing small bowel loops. 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 Bilateral nephrolithiasis. Small kidneys with cortical thinning and scarring. No hydronephrosis. Electronically Signed   By: Rolm Baptise M.D.   On: 09/22/2015 13:04   Dg Abd 1 View  Result Date: 09/22/2015 CLINICAL DATA:  NG tube placement. EXAM: ABDOMEN - 1 VIEW COMPARISON:  CT earlier this day. FINDINGS: The tip and side port of the enteric tube are below the diaphragm in the stomach. Dilated bowel loops on prior CT are fluid-filled and not well seen radiographically. There is no evidence of free air. IMPRESSION: Tip and side port of the enteric tube below the diaphragm in the stomach. Electronically Signed   By: Jeb Levering M.D.   On: 09/22/2015 21:55   US Abdomen Complete  Result Date: 09/18/2015 CLINICAL DATA:   Patient is with vomiting, weakness and near syncopal event. EXAM: ABDOMEN ULTRASOUND COMPLETE COMPARISON:  Abdominal ultrasound (aorta) 07/16/2012 FINDINGS: Gallbladder: No gallstones or wall thickening visualized. No sonographic Murphy sign noted by sonographer. Common bile duct: Diameter: 2 mm Liver: No focal lesion identified. Within normal limits in parenchymal echogenicity. IVC: Not visualized due to overlying bowel gas. Pancreas: Not visualized due to overlying bowel gas. Spleen: Not visualized due to overlying bowel gas. Right Kidney: Length: 11.6 cm. Nodular and atrophic cortex. Increased renal cortical echogenicity. No hydronephrosis. Left Kidney:  Not visualized. Abdominal aorta: Not visualized due to overlying bowel gas. Other findings: None. IMPRESSION: Markedly limited exam given extensive overlying bowel gas. No cholelithiasis or sonographic evidence for acute cholecystitis. Atrophic right kidney with increased cortical echogenicity as can be seen with chronic medical renal disease. Electronically Signed   By: Lovey Newcomer M.D.   On: 09/18/2015 11:04   Dg Chest Port 1 View  Result Date: 09/24/2015 CLINICAL DATA:  Fever and shortness of breath. EXAM: PORTABLE CHEST 1 VIEW COMPARISON:  09/20/2015 and 02/03/2009 radiographs FINDINGS: The cardiomediastinal silhouette is unchanged. Elevation of the right hemidiaphragm again noted. There is  no evidence of focal airspace disease, pulmonary edema, suspicious pulmonary nodule/mass, pleural effusion, or pneumothorax. No acute bony abnormalities are identified. IMPRESSION: No evidence of acute cardiopulmonary disease. Electronically Signed   By: Margarette Canada M.D.   On: 09/24/2015 17:53   Dg Abd Acute W/chest  Result Date: 09/20/2015 CLINICAL DATA:  Vomiting for 1 week EXAM: DG ABDOMEN ACUTE W/ 1V CHEST COMPARISON:  None. FINDINGS: Punctate calcifications project over the upper and lower poles of the left kidney. Probable calcifications projecting over the  midpole of the right kidney. No evidence of obstruction, organomegaly or free air. No acute bony abnormality. Slight elevation of the right hemidiaphragm. No confluent opacities in the lungs. No effusions. Heart is normal size. No acute bony abnormality. Advanced degenerative changes in the left hip with moderate degenerative changes in the right hip. IMPRESSION: Probable bilateral nephrolithiasis. No evidence of bowel obstruction or free air. No acute cardiopulmonary disease. Electronically Signed   By: Rolm Baptise M.D.   On: 09/20/2015 09:06    Assessment/Plan  Symptomatic Orthostatic Hypotension With H/O Atrial fibrillation and Cardiomyopathy.  His EKG showed no acute changes.  Will decrease his Coreg to 12.5 MG BID. Continue to watch for his Heart rate. Hold lasix as patient seems euvolemic.  Check Weights daily. Also will refer him to his cardiologist to see if he needs any other agent for rate control. Patient on Xarelto for his A Fibrilation  Anemia Secondary to surgery   HGB stable.  Continue to monitor.  Incision infection Continue Keflex. Staples almost out.   Hypokalemia resolved with supplement.  Family/ staff Communication:   Labs/tests ordered:  Repeat CBC and CMP in one week.

## 2015-10-15 ENCOUNTER — Ambulatory Visit (INDEPENDENT_AMBULATORY_CARE_PROVIDER_SITE_OTHER): Payer: PPO | Admitting: Physician Assistant

## 2015-10-15 ENCOUNTER — Encounter: Payer: Self-pay | Admitting: Physician Assistant

## 2015-10-15 VITALS — BP 120/64 | HR 117 | Ht 74.5 in | Wt 233.4 lb

## 2015-10-15 DIAGNOSIS — Z7901 Long term (current) use of anticoagulants: Secondary | ICD-10-CM | POA: Diagnosis not present

## 2015-10-15 DIAGNOSIS — I482 Chronic atrial fibrillation, unspecified: Secondary | ICD-10-CM

## 2015-10-15 DIAGNOSIS — E861 Hypovolemia: Secondary | ICD-10-CM | POA: Diagnosis not present

## 2015-10-15 DIAGNOSIS — I951 Orthostatic hypotension: Secondary | ICD-10-CM | POA: Diagnosis not present

## 2015-10-15 LAB — CBC
HEMATOCRIT: 30.1 % — AB (ref 38.5–50.0)
Hemoglobin: 10.1 g/dL — ABNORMAL LOW (ref 13.2–17.1)
MCH: 30.8 pg (ref 27.0–33.0)
MCHC: 33.6 g/dL (ref 32.0–36.0)
MCV: 91.8 fL (ref 80.0–100.0)
MPV: 9.2 fL (ref 7.5–12.5)
PLATELETS: 491 10*3/uL — AB (ref 140–400)
RBC: 3.28 MIL/uL — ABNORMAL LOW (ref 4.20–5.80)
RDW: 13.2 % (ref 11.0–15.0)
WBC: 8.9 10*3/uL (ref 3.8–10.8)

## 2015-10-15 LAB — BASIC METABOLIC PANEL WITH GFR
BUN: 24 mg/dL (ref 7–25)
CO2: 25 mmol/L (ref 20–31)
Calcium: 8.8 mg/dL (ref 8.6–10.3)
Chloride: 101 mmol/L (ref 98–110)
Creat: 1.86 mg/dL — ABNORMAL HIGH (ref 0.70–1.18)
GFR, EST AFRICAN AMERICAN: 40 mL/min — AB (ref >=60)
GFR, EST NON AFRICAN AMERICAN: 35 mL/min — AB (ref >=60)
Glucose, Bld: 95 mg/dL (ref 65–99)
POTASSIUM: 4 mmol/L (ref 3.5–5.3)
SODIUM: 137 mmol/L (ref 135–146)

## 2015-10-15 MED ORDER — RIVAROXABAN 15 MG PO TABS: 15.0000 mg | ORAL_TABLET | Freq: Every day | ORAL | 3 refills | Status: DC

## 2015-10-15 MED ORDER — METOPROLOL TARTRATE 25 MG PO TABS: 25.0000 mg | ORAL_TABLET | Freq: Three times a day (TID) | ORAL | 3 refills | Status: DC

## 2015-10-15 NOTE — Progress Notes (Signed)
Thank you for clearing it all up

## 2015-10-15 NOTE — Progress Notes (Signed)
Cardiology Office Note   Date:  10/15/2015   ID:  [name] [name], DOB 1940-12-21, MRN [id number]  PCP:  [name] Kilts, MD  Cardiologist:  Dr [name] [name] 08/2015  [name] [name], [name] Marker, PA-C   No chief complaint on file.   History of Present Illness: [name] [name] is a 75 y.o. male with a history of chronic afib on coumadin, hypothyroid, malnutrition, HTN, prostate CA, CKD III, NICM felt 2nd rapid afib w/ mildly abnl MV 2011, EF nl by echo 2013, obesity  Admit 09/09-09/27 for Incarcerated-strangulated right inguinal hernia w/ SBO, s/p partial small bowel resection, AKI on CKD, malnutrition. Anticoag changed to [place] presents for post hospital follow-up  He has been in the [facility] since discharge from the hospital. His med list was extremely confusing, but was eventually clarified. He had previously been on high doses of carvedilol at 37.5 mm twice a day, Lasix, and losartan. However, the carvedilol is now at 12.5 mg twice a day and the Lasix plus losartan have been discontinued.  He has not had chest pain. He does not have palpitations and has no awareness of his heart rate, fast or slow.  He is still very weak. He is working with physical therapy, but is not making much progress. He reports being lightheaded and dizzy when he stands up. Also has been weaker when he stands up.  He states he is eating well. He is not having any problems with elimination, denies dysuria or constipation. He has had no diarrhea.  He does not drink very much water. He will drink enough water to take his medications which is not very much. He has juice in the morning, tea at lunch and his family brings him a Pepsi in the afternoon.   Past Medical History:  Diagnosis Date  . Cardiomyopathy [facility])    h/o felt 2nd atrial fib, EF normal by echo 2013  . Chronic anticoagulation   . Chronic atrial fibrillation ([place])   . Chronic kidney disease, stage 3   . Hypothyroidism    . Pneumonia   . Prostate cancer ([place])   . Shortness of breath   . Systemic hypertension     Past Surgical History:  Procedure Laterality Date  . CARDIOVERSION    . CATARACT EXTRACTION W/PHACO  04/17/2011   Procedure: CATARACT EXTRACTION PHACO AND INTRAOCULAR LENS PLACEMENT (IOC);  Surgeon: [name], MD;  Location: [facility];  Service: Ophthalmology;  Laterality: Left;  CDE:14.58  . CATARACT EXTRACTION W/PHACO  05/22/2011   Procedure: CATARACT EXTRACTION PHACO AND INTRAOCULAR LENS PLACEMENT (IOC);  Surgeon: [name], MD;  Location: [facility];  Service: Ophthalmology;  Laterality: Right;  CDE 13.02  . COLONOSCOPY  01/17/2011   Procedure: COLONOSCOPY;  Surgeon: Jamesetta So;  Location[facility];  Service: Gastroenterology;  Laterality: N/A;  . INGUINAL HERNIA REPAIR Right 09/27/2015   Procedure: RIGHT INGUINAL HERNIORRHAPY WITH MESH SMALL BOWEL RESECTION;  Surgeon: Aviva Signs, MD;  Location: [facility] ORS;  Service: General;  Laterality: Right;  . NM MYOCAR [place]  02/2009   dipyridamole; mild perfusion defect due to infarct/scar with mild perinfarct ischemia is apical septal, apical basal inferoseptal, basal inferior, mid inferoseptal mid inferior & apical inferior; EKG negative for ischemia; low risk scan  . PROSTATE SURGERY     7 years ago [facility] Dr Javaid-prostatectomy  . PROSTATECTOMY    . Sleep Study  05/2011   increased upper airway resistance syndrome with mild  sleep apnea during REM  . TRANSTHORACIC ECHOCARDIOGRAM  04/2011   EF=50-55%; RV mildly dilated; LA mild-mod dilated; mild mitral valve annular calcif, mild MR; mod TR, RVSP elevated 30-59mmHg, mild pulm HTN; AV mildly sclerotic, mild calcif of AV leaflets; trace pulm valve regurg     No current facility-administered medications for this visit.    Current Outpatient Prescriptions  Medication Sig Dispense Refill  . metoprolol tartrate (LOPRESSOR) 25 MG tablet Take 1 tablet (25 mg total) by mouth 3 (three) times daily. 180  tablet 3  . Rivaroxaban (XARELTO) 15 MG TABS tablet Take 1 tablet (15 mg total) by mouth daily with supper. 42 tablet 3   Facility-Administered Medications Ordered in Other Visits  Medication Dose Route Frequency Provider Last Rate Last Dose  . neomycin-polymyxin-dexameth (MAXITROL) 0.1 % ophth ointment    PRN [name], MD   1 application at 95/09/32 [phone]    Allergies:   Review of patient's allergies indicates no known allergies.    Social History:  The patient  reports that he has never smoked. He has never used smokeless tobacco. He reports that he does not drink alcohol or use drugs.   Family History:  The patient's family history includes Diabetes in his mother; Prostate cancer in his father.    ROS:  Please see the history of present illness. All other systems are reviewed and negative.    PHYSICAL EXAM: VS:  BP 120/64   Pulse (!) 117   Ht 6' 2.5" (1.892 m)   Wt 233 lb 6.4 oz (105.9 kg)   BMI 29.57 kg/m  , BMI Body mass index is 29.57 kg/m. GEN: Well nourished, well developed, male in no acute distress  HEENT: normal for age  Neck: no JVD, no carotid bruit, no masses Cardiac: Irregular and rapid rate and rhythm; no murmur, no rubs, or gallops Respiratory:  Decreased breath sounds bases bilaterally, normal work of breathing GI: soft, nontender, nondistended, + BS MS: no deformity or atrophy; no edema; distal pulses are 2+ in all 4 extremities   Skin: warm and dry, no rash Neuro:  Strength and sensation are intact Psych: euthymic mood, full affect   EKG:  EKG is ordered today. The ekg ordered today demonstrates atrial fibrillation, rapid ventricular response, heart rate 117   MYOVIEW 2011  scar with mild peri-infarct ischemia in the apex and the inferior wall. It was felt to be a low risk scan since no significant ischemia was demonstrated.   ECHO 08/2015 - Left ventricle: The cavity size was normal. Wall thickness was   increased in a pattern of mild LVH. There  was mild focal basal   hypertrophy of the septum. Systolic function was normal. The   estimated ejection fraction was in the range of 50% to 55%. Wall   motion was normal; there were no regional wall motion   abnormalities. Left ventricular diastolic function parameters   were normal. - Mitral valve: There was mild regurgitation. - Left atrium: The atrium was mildly dilated. - Right atrium: The atrium was mildly dilated. - Atrial septum: No defect or patent foramen ovale was identified. - Tricuspid valve: There was mild-moderate regurgitation. - Pulmonary arteries: PA peak pressure: 41 mm Hg (S).   Recent Labs: 09/23/2015: TSH 1.673 10/03/2015: ALT 16 10/06/2015: Magnesium 1.9 10/14/2015: BUN 28; Creatinine, Ser 1.92; Hemoglobin 9.0; Platelets 456; Potassium 3.8; Sodium 137     Wt Readings from Last 3 Encounters:  10/15/15 233 lb 6.4 oz (105.9 kg)  10/06/15  242 lb 1 oz (109.8 kg)  09/15/15 250 lb (113.4 kg)     Other studies Reviewed: Additional studies/ records that were reviewed today include: Office notes, hospital records and testing.  ASSESSMENT AND PLAN:  1.  Orthostatic syncope/hypotension/hypovolemia: Because of the postural dizziness, his orthostatic vital signs were checked.  Lying   SBP/DBP-HR Sitting Standing Standing 3"  On Arrival 119/79- 110 99/69-110 77/84-81 unable        After IVF 124/84-99 109/66-105 115/60-72 106/57-71               He was very symptomatic with standing, had near syncope with weakness, SBP 77 at the time. He is at significant risk of falling or losing consciousness. He has lost about 10 pounds in less than 2 weeks.     He was given 500 mL of IV normal saline in the office. After the IV fluids, he felt much better and his orthostatic vital signs were significantly improved. He is advised that he needs to drink 2 bottles of water daily, to replace part of the tea and soda. If he starts getting short of breath or starts getting lower extremity  edema, cut this back. We will check a CBC and BMET to make sure that he is not losing blood or overly dehydrated.  2. Rapid atrial fibrillation: His heart rate is undoubtedly elevated secondary to the hypovolemia. However, he may benefit from changing the carvedilol to metoprolol since his EF has normalized and he needs heart rate control more than anything else. We will try low-dose of metoprolol at 25 mg 3 times a day and see how this is tolerated.  3. Chronic anticoagulation: He was changed from Coumadin to Xarelto. It was explained to the family that this may be more expensive for him but will be easier because there are no dietary restrictions and no frequent blood draws. He will be given a prescription for Xarelto and check with the outpatient pharmacy to see how much it will cost him. They will let us know if they can afford it or not.  Current medicines are reviewed at length with the patient today.  The patient has concerns regarding medicines. Concerns were addressed  The following changes have been made:  Stop carvedilol, start metoprolol  Labs/ tests ordered today include:   Orders Placed This Encounter  Procedures  . CBC  . BASIC METABOLIC PANEL WITH GFR  . EKG 12-Lead     Disposition:   FU with Dr. Sallyanne [name]  Signed, Rosaria Ferries, PA-C  10/15/2015 2:40 PM    [place] Phone: [phone]; Fax: [phone]  This note was written with the assistance of speech recognition software. Please excuse any transcriptional errors.

## 2015-10-15 NOTE — Patient Instructions (Signed)
Medications:  Discontinue Carvedilol.  START Metoprolol 25 mg--1 tablet three times daily.   Labwork:  Your physician recommends that you return for lab work today--BMET, CBC   Other Instructions:  Your physician recommends that you weigh, daily, at the same time every day, and in the same amount of clothing. Please record your daily weights on the handout provided and bring it to your next appointment.   Follow-Up:  Your physician recommends that you schedule a follow-up appointment in: 2 months with Dr. Sallyanne Kuster.  If you need a refill on your cardiac medications before your next appointment, please call your pharmacy.

## 2015-10-21 ENCOUNTER — Encounter (HOSPITAL_COMMUNITY)
Admission: RE | Admit: 2015-10-21 | Discharge: 2015-10-21 | Disposition: A | Discharge status: Home / Self Care | Payer: PPO | Source: Skilled Nursing Facility | Attending: *Deleted | Admitting: *Deleted

## 2015-10-21 LAB — COMPREHENSIVE METABOLIC PANEL
ALT: 14 U/L — ABNORMAL LOW (ref 17–63)
ANION GAP: 5 (ref 5–15)
AST: 16 U/L (ref 15–41)
Albumin: 2.5 g/dL — ABNORMAL LOW (ref 3.5–5.0)
Alkaline Phosphatase: 48 U/L (ref 38–126)
BUN: 25 mg/dL — AB (ref 6–20)
CALCIUM: 8.8 mg/dL — AB (ref 8.9–10.3)
CO2: 25 mmol/L (ref 22–32)
CREATININE: 1.81 mg/dL — AB (ref 0.61–1.24)
Chloride: 107 mmol/L (ref 101–111)
GFR calc non Af Amer: 35 mL/min — ABNORMAL LOW (ref >60)
GFR, EST AFRICAN AMERICAN: 40 mL/min — AB (ref >60)
GLUCOSE: 100 mg/dL — AB (ref 65–99)
POTASSIUM: 3.7 mmol/L (ref 3.5–5.1)
SODIUM: 137 mmol/L (ref 135–145)
TOTAL PROTEIN: 6.8 g/dL (ref 6.5–8.1)
Total Bilirubin: 0.3 mg/dL (ref 0.3–1.2)

## 2015-10-21 LAB — CBC
HCT: 29.2 % — ABNORMAL LOW (ref 39.0–52.0)
Hemoglobin: 9.6 g/dL — ABNORMAL LOW (ref 13.0–17.0)
MCH: 31.2 pg (ref 26.0–34.0)
MCHC: 32.9 g/dL (ref 30.0–36.0)
MCV: 94.8 fL (ref 78.0–100.0)
PLATELETS: 334 10*3/uL (ref 150–400)
RBC: 3.08 MIL/uL — ABNORMAL LOW (ref 4.22–5.81)
RDW: 13.5 % (ref 11.5–15.5)
WBC: 9.1 10*3/uL (ref 4.0–10.5)

## 2015-10-25 ENCOUNTER — Encounter: Payer: Self-pay | Admitting: Internal Medicine

## 2015-10-25 ENCOUNTER — Non-Acute Institutional Stay (SKILLED_NURSING_FACILITY): Payer: PPO | Admitting: Internal Medicine

## 2015-10-25 DIAGNOSIS — I951 Orthostatic hypotension: Secondary | ICD-10-CM | POA: Diagnosis not present

## 2015-10-25 DIAGNOSIS — N183 Chronic kidney disease, stage 3 unspecified: Secondary | ICD-10-CM

## 2015-10-25 DIAGNOSIS — I10 Essential (primary) hypertension: Secondary | ICD-10-CM | POA: Diagnosis not present

## 2015-10-25 DIAGNOSIS — I482 Chronic atrial fibrillation, unspecified: Secondary | ICD-10-CM

## 2015-10-25 NOTE — Progress Notes (Signed)
Location:   [facility] Room Number: 123/P Place of Service:  SNF (31) Provider:  Gertie Gowda, MD  Patient Care Team: [name], MD as PCP - General (Family Medicine) [name], MD as Consulting Physician (Nephrology) [name], MD as Consulting Physician (Urology) [name], MD as Consulting Physician (Endocrinology)  Extended Emergency Contact Information Primary Emergency Contact: [place], [place] [address] [place] Phone: [phone] Relation: Son Secondary Emergency Contact: Laiche,[name] Address: [address] [place], [place] [address] [place] Phone: [phone] Relation: Son  Code Status:  Full Code Goals of care: Advanced Directive information Advanced Directives 10/25/2015  Does patient have an advance directive? Yes  Type of Advance Directive (No Data)  Does patient want to make changes to advanced directive? No - Patient declined  Copy of advanced directive(s) in chart? Yes  Pre-existing out of facility DNR order (yellow form or pink MOST form) -     Chief Complaint  Patient presents with  . Acute Visit    HPI:  Pt is a 75 y.o. male seen today for an acute visit for Follow up for his dizziness. Patient has done well since his Coreg was changed to Metoprolol. His Lasix and Losartan was also stopped due to Orthostatic Hypotension. Patient says he has been doing well with therapy. He is not getting dizzy and is now able to walk with his walker.His appetite is good. He does have some SOB. But he says she always had that. Some dry cough. Continues to have some palpitations on exertion. He was on Lasix at home . It is on hold but his weight has been stable at 233 lbs.   Past Medical History:  Diagnosis Date  . Cardiomyopathy [facility])    h/o felt 2nd atrial fib, EF normal by echo 2013  . Chronic anticoagulation   . Chronic  atrial fibrillation ([place])   . Chronic kidney disease, stage 3   . Hypothyroidism   . Pneumonia   . Prostate cancer ([place])   . Shortness of breath   . Systemic hypertension    Past Surgical History:  Procedure Laterality Date  . CARDIOVERSION    . CATARACT EXTRACTION W/PHACO  04/17/2011   Procedure: CATARACT EXTRACTION PHACO AND INTRAOCULAR LENS PLACEMENT (IOC);  Surgeon: [name], MD;  Location: [facility];  Service: Ophthalmology;  Laterality: Left;  CDE:14.58  . CATARACT EXTRACTION W/PHACO  05/22/2011   Procedure: CATARACT EXTRACTION PHACO AND INTRAOCULAR LENS PLACEMENT (IOC);  Surgeon: [name], MD;  Location: [facility];  Service: Ophthalmology;  Laterality: Right;  CDE 13.02  . COLONOSCOPY  01/17/2011   Procedure: COLONOSCOPY;  Surgeon: Jamesetta So;  Location: [facility];  Service: Gastroenterology;  Laterality: N/A;  . INGUINAL HERNIA REPAIR Right 09/27/2015   Procedure: RIGHT INGUINAL HERNIORRHAPY WITH MESH SMALL BOWEL RESECTION;  Surgeon: Aviva Signs, MD;  Location: [facility];  Service: General;  Laterality: Right;  . NM MYOCAR [place]  02/2009   dipyridamole; mild perfusion defect due to infarct/scar with mild perinfarct ischemia is apical septal, apical basal inferoseptal, basal inferior, mid inferoseptal mid inferior & apical inferior; EKG negative for ischemia; low risk scan  . PROSTATE SURGERY     7 years ago [facility] Dr Javaid-prostatectomy  . PROSTATECTOMY    . Sleep Study  05/2011   increased upper  airway resistance syndrome with mild sleep apnea during REM  . TRANSTHORACIC ECHOCARDIOGRAM  04/2011   EF=50-55%; RV mildly dilated; LA mild-mod dilated; mild mitral valve annular calcif, mild MR; mod TR, RVSP elevated 30-22mmHg, mild pulm HTN; AV mildly sclerotic, mild calcif of AV leaflets; trace pulm valve regurg     No Known Allergies  Current Facility-Administered Medications on File Prior to Visit  Medication Dose Route Frequency Provider Last Rate Last Dose  .  neomycin-polymyxin-dexameth (MAXITROL) 0.1 % ophth ointment    PRN [name], MD   1 application at 66/59/93 [phone]   Current Outpatient Prescriptions on File Prior to Visit  Medication Sig Dispense Refill  . acetaminophen (TYLENOL) 500 MG tablet Take 1 tablet (500 mg total) by mouth every 6 (six) hours as needed for mild pain or headache.    Roseanne Kaufman [place]-Castor Oil (VENELEX) OINT Apply to sacrum and bilateral buttocks q shifts prn for blanche every shift    . Calcium Carbonate-Vitamin D (CALCIUM 600+D) 600-400 MG-UNIT tablet Take 1 tablet by mouth 2 (two) times daily.    Marland Kitchen levothyroxine (SYNTHROID, LEVOTHROID) 50 MCG tablet Take 1 tablet (50 mcg total) by mouth every morning. 30 tablet 6  . metoprolol tartrate (LOPRESSOR) 25 MG tablet Take 1 tablet (25 mg total) by mouth 3 (three) times daily. 180 tablet 3  . Rivaroxaban (XARELTO) 15 MG TABS tablet Take 1 tablet (15 mg total) by mouth daily with supper. 42 tablet 3  . Vitamin D, Ergocalciferol, (DRISDOL) 50000 units CAPS capsule Take 50,000 Units by mouth every 30 (thirty) days.       Review of Systems  Respiratory: Positive for cough and shortness of breath. Negative for apnea, choking, chest tightness, wheezing and stridor.   Cardiovascular: Positive for palpitations. Negative for chest pain and leg swelling.  Gastrointestinal: Negative for abdominal distention, abdominal pain, anal bleeding, blood in stool, constipation, diarrhea, nausea, rectal pain and vomiting.  Neurological: Negative for dizziness, tremors, syncope, weakness, light-headedness and numbness.     There is no immunization history on file for this patient. Pertinent  Health Maintenance Due  Topic Date Due  . INFLUENZA VACCINE  12/10/2015 (Originally 08/10/2015)  . PNA vac Low Risk Adult (1 of 2 - PCV13) 10/09/2016 (Originally 02/28/2005)  . COLONOSCOPY  01/16/2021   Fall Risk  07/02/2015 12/31/2014  Falls in the past year? No No   Functional Status Survey:     Vitals:   10/25/15 1213  BP: 129/78  Pulse: (!) 102  Resp: 20  Temp: 97.9 F (36.6 C)  TempSrc: Oral   There is no height or weight on file to calculate BMI. Physical Exam  Constitutional: He is oriented to person, place, and time. He appears well-developed and well-nourished.  HENT:  Head: Normocephalic.  Mouth/Throat: Oropharynx is clear and moist.  Cardiovascular: Normal rate, regular rhythm and normal heart sounds.   Pulmonary/Chest: Effort normal and breath sounds normal. No respiratory distress. He has no wheezes. He has no rales. He exhibits no tenderness.  Abdominal: Soft. Bowel sounds are normal. He exhibits no distension. There is no tenderness. There is no rebound.  Scar healing well with no discharge. The staples are out.  Musculoskeletal: He exhibits no edema.  Neurological: He is alert and oriented to person, place, and time.    Labs reviewed:  Recent Labs  09/28/15 0427  10/01/15 0349  10/05/15 0641 10/06/15 0613  10/14/15 0700 10/15/15 1251 10/21/15 0500  NA 139  < > 138  < >  139 140  < > 137 137 137  K 5.5*  < > 3.9  < > 3.1* 3.4*  < > 3.8 4.0 3.7  CL 111  < > 104  < > 102 106  < > 103 101 107  CO2 24  < > 27  < > 28 27  < > 27 25 25   GLUCOSE 104*  < > 95  < > 113* 123*  < > 106* 95 100*  BUN 22*  < > 14  < > 14 18  < > 28* 24 25*  CREATININE 2.71*  < > 1.72*  < > 1.76* 1.75*  < > 1.92* 1.86* 1.81*  CALCIUM 7.4*  < > 7.4*  < > 7.5* 7.4*  < > 8.3* 8.8 8.8*  MG 1.3*  < > 1.6*  --  1.4* 1.9  --   --   --   --   PHOS 4.2  --   --   --   --   --   --   --   --   --   < > = values in this interval not displayed.  Recent Labs  09/23/15 0651 10/03/15 0648 10/21/15 0500  AST 17 20 16   ALT 17 16* 14*  ALKPHOS 44 68 48  BILITOT 0.8 0.7 0.3  PROT 6.0* 5.9* 6.8  ALBUMIN 2.6* 1.8* 2.5*    Recent Labs  09/27/15 0435  10/12/15 0730 10/14/15 0700 10/15/15 1251 10/21/15 0500  WBC 13.0*  < > 9.5 9.5 8.9 9.1  NEUTROABS 9.3*  --  4.7  --   --   --    HGB 10.8*  < > 9.3* 9.0* 10.1* 9.6*  HCT 34.2*  < > 28.9* 28.5* 30.1* 29.2*  MCV 101.8*  < > 97.0 96.9 91.8 94.8  PLT 337  < > 512* 456* 491* 334  < > = values in this interval not displayed. Lab Results  Component Value Date   TSH 1.673 09/23/2015   No results found for: HGBA1C Lab Results  Component Value Date   CHOL  02/04/2009    142        ATP III CLASSIFICATION:  <200     mg/dL   Desirable  200-239  mg/dL   Borderline High  >=240    mg/dL   High          HDL 33 (L) 02/04/2009   LDLCALC  02/04/2009    85        Total Cholesterol/HDL:CHD Risk Coronary Heart Disease Risk Table                     Men   Women  1/2 Average Risk   3.4   3.3  Average Risk       5.0   4.4  2 X Average Risk   9.6   7.1  3 X Average Risk  23.4   11.0        Use the calculated Patient Ratio above and the CHD Risk Table to determine the patient's CHD Risk.        ATP III CLASSIFICATION (LDL):  <100     mg/dL   Optimal  100-129  mg/dL   Near or Above                    Optimal  130-159  mg/dL   Borderline  160-189  mg/dL   High  >190  mg/dL   Very High   TRIG 122 02/04/2009   CHOLHDL 4.3 02/04/2009    Significant Diagnostic Results in last 30 days:  No results found.  Assessment/Plan  Orthostatic syncope Resolved since he was changed from Coreg to Metoprolol. Continue to hold his Lasix and Losartan.    Chronic atrial fibrillation Patient rate controlled on Metoprolol.On xarelto Essential hypertension BP well controlled. Can benefit with Cozaar but will not restart yet. Will wait for patient to recover.  CKD (chronic kidney disease) stage 3, GFR 30-59 ml/min Patient Creat is stable at 1.8 close to his baseline. He is Euvolemic and will hold lasix for now. Continue to monitor weight. Anemia Hgb has been stable at 9.6. Was 13.5 before Acute surgery.  Will start on Iron supplement.    Family/ staff Communication:   Labs/tests ordered:

## 2015-10-28 ENCOUNTER — Encounter: Payer: Self-pay | Admitting: Internal Medicine

## 2015-10-28 ENCOUNTER — Non-Acute Institutional Stay (SKILLED_NURSING_FACILITY): Payer: PPO | Admitting: Internal Medicine

## 2015-10-28 ENCOUNTER — Encounter (HOSPITAL_COMMUNITY)
Admission: RE | Admit: 2015-10-28 | Discharge: 2015-10-28 | Disposition: A | Discharge status: Home / Self Care | Payer: PPO | Source: Skilled Nursing Facility | Attending: *Deleted | Admitting: *Deleted

## 2015-10-28 DIAGNOSIS — E039 Hypothyroidism, unspecified: Secondary | ICD-10-CM | POA: Diagnosis not present

## 2015-10-28 DIAGNOSIS — E43 Unspecified severe protein-calorie malnutrition: Secondary | ICD-10-CM

## 2015-10-28 DIAGNOSIS — N183 Chronic kidney disease, stage 3 unspecified: Secondary | ICD-10-CM

## 2015-10-28 DIAGNOSIS — I482 Chronic atrial fibrillation, unspecified: Secondary | ICD-10-CM

## 2015-10-28 DIAGNOSIS — K4030 Unilateral inguinal hernia, with obstruction, without gangrene, not specified as recurrent: Secondary | ICD-10-CM

## 2015-10-28 DIAGNOSIS — I429 Cardiomyopathy, unspecified: Secondary | ICD-10-CM

## 2015-10-28 LAB — COMPREHENSIVE METABOLIC PANEL
ALBUMIN: 2.8 g/dL — AB (ref 3.5–5.0)
ALK PHOS: 49 U/L (ref 38–126)
ALT: 13 U/L — ABNORMAL LOW (ref 17–63)
ANION GAP: 7 (ref 5–15)
AST: 17 U/L (ref 15–41)
BUN: 32 mg/dL — ABNORMAL HIGH (ref 6–20)
CALCIUM: 9 mg/dL (ref 8.9–10.3)
CHLORIDE: 105 mmol/L (ref 101–111)
CO2: 24 mmol/L (ref 22–32)
Creatinine, Ser: 1.87 mg/dL — ABNORMAL HIGH (ref 0.61–1.24)
GFR calc non Af Amer: 34 mL/min — ABNORMAL LOW (ref >60)
GFR, EST AFRICAN AMERICAN: 39 mL/min — AB (ref >60)
GLUCOSE: 98 mg/dL (ref 65–99)
POTASSIUM: 4.3 mmol/L (ref 3.5–5.1)
SODIUM: 136 mmol/L (ref 135–145)
Total Bilirubin: 0.6 mg/dL (ref 0.3–1.2)
Total Protein: 7.1 g/dL (ref 6.5–8.1)

## 2015-10-28 LAB — CBC
HCT: 29.5 % — ABNORMAL LOW (ref 39.0–52.0)
Hemoglobin: 9.6 g/dL — ABNORMAL LOW (ref 13.0–17.0)
MCH: 31.2 pg (ref 26.0–34.0)
MCHC: 32.5 g/dL (ref 30.0–36.0)
MCV: 95.8 fL (ref 78.0–100.0)
PLATELETS: 338 10*3/uL (ref 150–400)
RBC: 3.08 MIL/uL — ABNORMAL LOW (ref 4.22–5.81)
RDW: 14.3 % (ref 11.5–15.5)
WBC: 8.8 10*3/uL (ref 4.0–10.5)

## 2015-10-28 NOTE — Progress Notes (Signed)
Location:   [facility] Room Number: 123/P Place of Service:  SNF (31) Provider:  Leana Roe, MD  Patient Care Team: [name], MD as PCP - General (Family Medicine) [name], MD as Consulting Physician (Nephrology) [name], MD as Consulting Physician (Urology) [name], MD as Consulting Physician (Endocrinology)  Extended Emergency Contact Information Primary Emergency Contact: [place], [place] [address] [place]itter of [place] Phone: [phone] Relation: Son Secondary Emergency Contact: Cullipher,[name] Address: [address] [place], [place] [address] [place] Phone: [phone] Relation: Son  Code Status:  Full Code Goals of care: Advanced Directive information Advanced Directives 10/28/2015  Does patient have an advance directive? Yes  Type of Advance Directive (No Data)  Does patient want to make changes to advanced directive? No - Patient declined  Copy of advanced directive(s) in chart? Yes  Pre-existing out of facility DNR order (yellow form or pink MOST form) -     Chief complaint acute visit secondary to tachycardia-hypotension-with history of atrial fibrillation-also possible -discharge review  HPI:  Pt is a 75 y.o. male seen today for an acute visit for possible discharge.  During exam patient was noted to be somewhat tachycardic with a pulse between 110-120.  Also had systolic blood pressure in the 90s.  Patient continues to deny any chest pain dizziness shortness of breath or increased weakness .  He does have a history of atrial fibrillation with rapid ventricular rate-he recently was seen by cardiology and his Coreg was discontinued in favor of Lopressor 25 mg twice a day Previously blood pressure medications have been discontinued including his losartan-his Lasix also was discontinued previously  \During recent cardiology  visit he was thought to be somewhat dry and did receive IV fluids that apparently helped with his tachycardia-labs  were fairly unremarkable and at baseline  \Speaking with his nurse today pulse apparently was elevated earlier today he did receive his routine Lopressor and pulse did come down to 59 eventually-- blood pressure was stable with systolic apparently around 130   Fluids have been encouraged and according  To  nursing staff he is eating and drinking quite well now   Patient is here for rehabilitation after hospitalization for a strangulated right inguinal hernia that was surgically repaired-hospital course was complicated by atrial fibrillation with rapid ventricular rate and again adjustments were made as noted above.  He has progressed well with therapy.  He did have postop anemia has been started on iron and hemoglobin show stability on lab done today at 9.6.  In regards to CHF his weight appears to be stable despite being off the Lasix currently 234 pounds-again he does not complain of increased shortness of breath or edema beyond baseline.  Regards to his other medical issues he does have history of hypothyroidism is on Synthroid TSH was within normal range at 1.67 in the hospital.  He also has a history of chronic kidney disease which appears to be stable with a creatinine of 1.87 on lab done today.  He also has a history of protein calorie malnutrition albumin at one point was 1.8 this has risen up to 2.8 on most recent lab.  Currently he is sitting in his chair comfortably again denies any increase shortness of breath from baseline no chest pain no dizziness does not feel he is having palpitations.  Past Medical History:  Diagnosis Date  . Cardiomyopathy [facility])    h/o felt 2nd atrial fib, EF normal by echo 2013  . Chronic anticoagulation   . Chronic atrial fibrillation ([place])   . Chronic kidney disease, stage 3   . Hypothyroidism   . Pneumonia   . Prostate  cancer ([place])   . Shortness of breath   . Systemic hypertension    Past Surgical History:  Procedure Laterality Date  . CARDIOVERSION    . CATARACT EXTRACTION W/PHACO  04/17/2011   Procedure: CATARACT EXTRACTION PHACO AND INTRAOCULAR LENS PLACEMENT (IOC);  Surgeon: [name], MD;  Location: [facility];  Service: Ophthalmology;  Laterality: Left;  CDE:14.58  . CATARACT EXTRACTION W/PHACO  05/22/2011   Procedure: CATARACT EXTRACTION PHACO AND INTRAOCULAR LENS PLACEMENT (IOC);  Surgeon: [name], MD;  Location: [facility];  Service: Ophthalmology;  Laterality: Right;  CDE 13.02  . COLONOSCOPY  01/17/2011   Procedure: COLONOSCOPY;  Surgeon: Jamesetta So;  Location: [facility] SU[facility];  Service: Gastroenterology;  Laterality: N/A;  . INGUINAL HERNIA REPAIR Right 09/27/2015   Procedure: RIGHT INGUINAL HERNIORRHAPY WITH MESH SMALL BOWEL RESECTION;  Surgeon: Aviva Signs, MD;  Location: [facility] OR[facility];  Service: General;  Laterality: Right;  . NM MYOCAR [place]  02/2009   dipyridamole; mild perfusion defect due to infarct/scar with mild perinfarct ischemia is apical septal, apical basal inferoseptal, basal inferior, mid inferoseptal mid inferior & apical inferior; EKG negative for ischemia; low risk scan  . PROSTATE SURGERY     7 years ago [facility] Dr Javaid-prostatectomy  . PROSTATECTOMY    . Sleep Study  05/2011   increased upper airway resistance syndrome with mild sleep apnea during REM  . TRANSTHORACIC ECHOCARDIOGRAM  04/2011   EF=50-55%; RV mildly dilated; LA mild-mod dilated; mild mitral valve annular calcif, mild MR; mod TR, RVSP elevated 30-34mmHg, mild pulm HTN; AV mildly sclerotic, mild calcif of AV leaflets; trace pulm valve regurg     No Known Allergies  Current Facility-Administered Medications on File Prior to Visit  Medication Dose Route Frequency Provider Last Rate Last Dose  . neomycin-polymyxin-dexameth (MAXITROL) 0.1 % ophth ointment    PRN [name], MD   1 application at 29/52/84 [phone]    Current Outpatient Prescriptions on File Prior to Visit  Medication Sig Dispense Refill  . acetaminophen (TYLENOL) 500 MG tablet Take 1 tablet (500 mg total) by mouth every 6 (six) hours as needed for mild pain or headache.    Roseanne Kaufman [place]-Castor Oil (VENELEX) OINT Apply to sacrum and bilateral buttocks q shifts prn for blanche every shift    . Calcium Carbonate-Vitamin D (CALCIUM 600+D) 600-400 MG-UNIT tablet Take 1 tablet by mouth 2 (two) times daily.    . clotrimazole (LOTRIMIN) 1 % cream Apply 1 application topically 2 (two) times daily.    Marland Kitchen levothyroxine (SYNTHROID, LEVOTHROID) 50 MCG tablet Take 1 tablet (50 mcg total) by mouth every morning. 30 tablet 6  . metoprolol tartrate (LOPRESSOR) 25 MG tablet Take 1 tablet (25 mg total) by mouth 3 (three) times daily. 180 tablet 3  . Rivaroxaban (XARELTO) 15 MG TABS tablet Take 1 tablet (15 mg total) by mouth daily with supper. 42 tablet 3  . Vitamin D, Ergocalciferol, (DRISDOL) 50000 units CAPS capsule Take 50,000 Units by mouth every 30 (thirty) days.       Review of Systems  General does not complaining fever or chills says he feels well looking forward to going home.  Skin does not complain of rashes or itching or any diaphoresis.  Head ears eyes nose mouth and throat does not complaining of any sore throat or visual changes.  Respiratory has somewhat chronic shortness of breath at times-- says this is not new and has not worsened does not complain of cough currently.  Cardiac does not complain of chest pain or increased lower extremity swelling-does have A. fib as noted above.  GI is not complaining of nausea vomiting diarrhea constipation or abdominal discomfort.  Musculoskeletal says he's gotten stronger now ambulates with a walker does not complain of joint pain.  Neurologic is not complaining of dizziness tremors or syncope weakness or lightheadedness or numbness.  Psych appears to be good spirits does not complain of overt  anxiety or depression.     There is no immunization history on file for this patient. Pertinent  Health Maintenance Due  Topic Date Due  . INFLUENZA VACCINE  12/10/2015 (Originally 08/10/2015)  . PNA vac Low Risk Adult (1 of 2 - PCV13) 10/09/2016 (Originally 02/28/2005)  . COLONOSCOPY  01/16/2021   Fall Risk  07/02/2015 12/31/2014  Falls in the past year? No No   Functional Status Survey:    Vitals:   10/28/15 1440  BP: (!) 96/52  Pulse: (!) 120  Resp: (!) 23  Temp: 98.1 F (36.7 C)  TempSrc: Oral  SpO2: 96%  Of note on serial exams pulses range from 100-120 There is no height or weight on file to calculate BMI. Physical Exam In general this is a very pleasant elderly male in no distress sitting comfortably in his chair he is denying any increased weakness dizziness or syncopal-type feelings.  Skin is warm and dry.  Eyes pupils appear reactive to light sclera and conjunctivae are clear visual acuity appears grossly intact.  Oropharynx he has numerous extractions oropharynx clear mucous membranes moist.  Chest is clear to auscultation there is no labored breathing. Has shallow air entry which appears to be baseline  Heart is regular irregular rate and rhythm with variable pulses ranging from 100-120-. Has  fairly minimal lower extremity edema  Abdomen is soft nontender positive bowel sounds there is a well-healed midline abdominal surgical scar.  Muscle skeletal is able to stand without assistance and uses walker-- minimal  lower extremity edema apparently this baseline  Neurologic is grossly intact to speech is clear no lateralizing findings.  Psych he is alert and oriented pleasant and appropriate Labs reviewed:  Recent Labs  09/28/15 0427  10/01/15 0349  10/05/15 0641 10/06/15 0613  10/15/15 1251 10/21/15 0500 10/28/15 0500  NA 139  < > 138  < > 139 140  < > 137 137 136  K 5.5*  < > 3.9  < > 3.1* 3.4*  < > 4.0 3.7 4.3  CL 111  < > 104  < > 102 106  < > 101  107 105  CO2 24  < > 27  < > 28 27  < > 25 25 24   GLUCOSE 104*  < > 95  < > 113* 123*  < > 95 100* 98  BUN 22*  < > 14  < > 14 18  < > 24 25* 32*  CREATININE 2.71*  < > 1.72*  < > 1.76* 1.75*  < > 1.86* 1.81* 1.87*  CALCIUM 7.4*  < > 7.4*  < > 7.5* 7.4*  < > 8.8 8.8* 9.0  MG 1.3*  < > 1.6*  --  1.4* 1.9  --   --   --   --  PHOS 4.2  --   --   --   --   --   --   --   --   --   < > = values in this interval not displayed.  Recent Labs  10/03/15 0648 10/21/15 0500 10/28/15 0500  AST 20 16 17   ALT 16* 14* 13*  ALKPHOS 68 48 49  BILITOT 0.7 0.3 0.6  PROT 5.9* 6.8 7.1  ALBUMIN 1.8* 2.5* 2.8*    Recent Labs  09/27/15 0435  10/12/15 0730  10/15/15 1251 10/21/15 0500 10/28/15 0500  WBC 13.0*  < > 9.5  < > 8.9 9.1 8.8  NEUTROABS 9.3*  --  4.7  --   --   --   --   HGB 10.8*  < > 9.3*  < > 10.1* 9.6* 9.6*  HCT 34.2*  < > 28.9*  < > 30.1* 29.2* 29.5*  MCV 101.8*  < > 97.0  < > 91.8 94.8 95.8  PLT 337  < > 512*  < > 491* 334 338  < > = values in this interval not displayed. Lab Results  Component Value Date   TSH 1.673 09/23/2015   No results found for: HGBA1C Lab Results  Component Value Date   CHOL  02/04/2009    142        ATP III CLASSIFICATION:  <200     mg/dL   Desirable  200-239  mg/dL   Borderline High  >=240    mg/dL   High          HDL 33 (L) 02/04/2009   LDLCALC  02/04/2009    85        Total Cholesterol/HDL:CHD Risk Coronary Heart Disease Risk Table                     Men   Women  1/2 Average Risk   3.4   3.3  Average Risk       5.0   4.4  2 X Average Risk   9.6   7.1  3 X Average Risk  23.4   11.0        Use the calculated Patient Ratio above and the CHD Risk Table to determine the patient's CHD Risk.        ATP III CLASSIFICATION (LDL):  <100     mg/dL   Optimal  100-129  mg/dL   Near or Above                    Optimal  130-159  mg/dL   Borderline  160-189  mg/dL   High  >190     mg/dL   Very High   TRIG 122 02/04/2009   CHOLHDL 4.3  02/04/2009    Significant Diagnostic Results in last 30 days:  No results found.  Assessment/Plan #1 atrial fibrillation with rapid ventricular rate-he is now on Lopressor 25 mg 3 times a day is on Xarelto for anticoagulation-rate appears to be somewhat elevated today per chart review appears there are times pulse is above 100--the highest pulse I see listed is 132---clinically he appears to be stable this is complicated somewhat with  a systolic blood pressure in the 90s.  He is asymptomatic of any dizziness or syncope.  We did do an EKG which shows atrial fibrillation with rapid ventricular rate with a pulse of 119-EKG is comparable to the one done at recent cardiology visit.  We will order a cardiology  consult hopefully this can be done tomorrow before patient is discharged -question possible addition of another agent ? Amiodarone-again will defer  to cardiology in this matter  Will order blood pressure and pulse checks every 2 hours 3 and then every shift  #2 history of chronic kidney disease as noted above creatinine today at 1.87 with BUN of 32 is relatively baseline and fluids are being encouraged.  #3 history of anemia postop hemoglobin at 9.6  Shows  stability he has been started on iron.  #4 history hypothyroidism he is on Synthroid TSH recently done in hospital was within normal range at 1.67.  #5 history CHF cardiomegaly he is now off Lasix nonetheless weight appears to be stable edema appears relatively at baseline again will await cardiology input on this matter.  #6 history of strangulated right inguinal hernia with small bowel obstruction this was surgically repaired he appears to be doing well with his rehabilitation he will need continued PT and OT at home for strengthening but his progress has been encouraging he is now using a walker.  #7 history of protein calorie malnutrition again he is eating and drinking better albumin has responded it is now 2.8 which is up from  1.8 level in the hospital.  Addendum-of note cardiology has agreed to see patient tomorrow-we are most appreciative of this-- will await their input in regards to his atrial fibrillation and cardiac issues--- again he appears to be stable although again his elevated heart rate is somewhat of concern complicated with somewhat low blood pressure at times  If he does go home tomorrow he will be with his son who is very supportive.  [id number] note greater than 45 minutes spent assessing patient-discussing his status with nursing staff-reviewing his chart-reviewing his labs-reassessing patient-and coordinating and formulating a plan of care-of note greater than 50% of time spent coordinating plan of care with input as noted above       Oralia Manis, [place]

## 2015-10-29 ENCOUNTER — Ambulatory Visit (INDEPENDENT_AMBULATORY_CARE_PROVIDER_SITE_OTHER): Payer: PPO | Admitting: Physician Assistant

## 2015-10-29 ENCOUNTER — Encounter: Payer: Self-pay | Admitting: Physician Assistant

## 2015-10-29 ENCOUNTER — Encounter: Payer: Self-pay | Admitting: Internal Medicine

## 2015-10-29 ENCOUNTER — Non-Acute Institutional Stay (SKILLED_NURSING_FACILITY): Payer: PPO | Admitting: Internal Medicine

## 2015-10-29 VITALS — BP 111/74 | HR 121 | Ht 74.5 in | Wt 237.0 lb

## 2015-10-29 DIAGNOSIS — I429 Cardiomyopathy, unspecified: Secondary | ICD-10-CM | POA: Diagnosis not present

## 2015-10-29 DIAGNOSIS — K4030 Unilateral inguinal hernia, with obstruction, without gangrene, not specified as recurrent: Secondary | ICD-10-CM

## 2015-10-29 DIAGNOSIS — I482 Chronic atrial fibrillation, unspecified: Secondary | ICD-10-CM

## 2015-10-29 DIAGNOSIS — N183 Chronic kidney disease, stage 3 unspecified: Secondary | ICD-10-CM

## 2015-10-29 DIAGNOSIS — R0609 Other forms of dyspnea: Secondary | ICD-10-CM | POA: Diagnosis not present

## 2015-10-29 DIAGNOSIS — Z7901 Long term (current) use of anticoagulants: Secondary | ICD-10-CM

## 2015-10-29 NOTE — Progress Notes (Signed)
Location:   [facility] Room Number: 123/P Place of Service:  SNF (31) Provider:  Leana Roe, MD  Patient Care Team: [name], MD as PCP - General (Family Medicine) [name], MD as Consulting Physician (Nephrology) [name], MD as Consulting Physician (Urology) [name], MD as Consulting Physician (Endocrinology)  Extended Emergency Contact Information Primary Emergency Contact: [place], [place] [address] [place]itter of [place] Phone: [phone] Relation: Son Secondary Emergency Contact: Wohl,[name] Address: [address] [place], [place] [address] [place] Phone: [phone] Relation: Son  Code Status:  Full Code Goals of care: Advanced Directive information Advanced Directives 10/29/2015  Does patient have an advance directive? Yes  Type of Advance Directive (No Data)  Does patient want to make changes to advanced directive? No - Patient declined  Copy of advanced directive(s) in chart? Yes  Pre-existing out of facility DNR order (yellow form or pink MOST form) -     Chief Complaint  Patient presents with  . Acute Visit    F/U Tachycardia   Pre-discharge note HPI:  Pt is a 75 y.o. male seen today for an acute visit for follow-up with tachycardia-as well as discharge review.  Patient was slated for discharge today but there was concern yesterday when he was evaluated and found to have tachycardia and hypotension although this was asymptomatic.  Cardiology was kind enough to see him today to assess suitability for discharge and they feel he is stable-he does have some history of chronic tachycardia and hypotension this has been going on for some time.  Cardiology has made changes including discontinuing his Coreg and starting Lopressor-tachycardia is not persistent fact when I saw him earlier today it was roughly 120 but later in the day  it was back below 100 and has been as low as 70s today.  Blood pressure has been stable it was 120/80 this morning.     Past Medical History:  Diagnosis Date  . Cardiomyopathy [facility])    h/o felt 2nd atrial fib, EF normal by echo 2013  . Chronic anticoagulation   . Chronic atrial fibrillation ([place])   . Chronic kidney disease, stage 3   . Hypothyroidism   . Pneumonia   . Prostate cancer ([place])   . Shortness of breath   . Systemic hypertension    Past Surgical History:  Procedure Laterality Date  . CARDIOVERSION    . CATARACT EXTRACTION W/PHACO  04/17/2011   Procedure: CATARACT EXTRACTION PHACO AND INTRAOCULAR LENS PLACEMENT (IOC);  Surgeon: [name], MD;  Location: [facility] ORS;  Service: Ophthalmology;  Laterality: Left;  CDE:14.58  . CATARACT EXTRACTION W/PHACO  05/22/2011   Procedure: CATARACT EXTRACTION PHACO AND INTRAOCULAR LENS PLACEMENT (IOC);  Surgeon: [name], MD;  Location: [facility] ORS;  Service: Ophthalmology;  Laterality: Right;  CDE 13.02  . COLONOSCOPY  01/17/2011   Procedure: COLONOSCOPY;  Surgeon: Jamesetta So;  Location: [facility] ENDO SUITE;  Service: Gastroenterology;  Laterality: N/A;  . INGUINAL HERNIA REPAIR Right 09/27/2015   Procedure: RIGHT INGUINAL HERNIORRHAPY WITH MESH SMALL BOWEL RESECTION;  Surgeon: Aviva Signs, MD;  Location: [facility] ORS;  Service: General;  Laterality: Right;  . NM [place]  02/2009   dipyridamole; mild perfusion defect due to infarct/scar with mild perinfarct ischemia is apical septal, apical basal inferoseptal, basal inferior, mid inferoseptal mid  inferior & apical inferior; EKG negative for ischemia; low risk scan  . PROSTATE SURGERY     7 years ago [facility] Dr Javaid-prostatectomy  . PROSTATECTOMY    . Sleep Study  05/2011   increased upper airway resistance syndrome with mild sleep apnea during REM  . TRANSTHORACIC ECHOCARDIOGRAM  04/2011   EF=50-55%; RV mildly dilated; LA mild-mod dilated; mild mitral valve annular calcif, mild MR; mod TR,  RVSP elevated 30-24mmHg, mild pulm HTN; AV mildly sclerotic, mild calcif of AV leaflets; trace pulm valve regurg     No Known Allergies  Current Facility-Administered Medications on File Prior to Visit  Medication Dose Route Frequency Provider Last Rate Last Dose  . neomycin-polymyxin-dexameth (MAXITROL) 0.1 % ophth ointment    PRN [name], MD   1 application at 31/54/00 [phone]   Current Outpatient Prescriptions on File Prior to Visit  Medication Sig Dispense Refill  . acetaminophen (TYLENOL) 500 MG tablet Take 1 tablet (500 mg total) by mouth every 6 (six) hours as needed for mild pain or headache.    Roseanne Kaufman [place]-Castor Oil (VENELEX) OINT Apply to sacrum and bilateral buttocks q shifts prn for blanche every shift    . Calcium Carbonate-Vitamin D (CALCIUM 600+D) 600-400 MG-UNIT tablet Take 1 tablet by mouth 2 (two) times daily.    . clotrimazole (LOTRIMIN) 1 % cream Apply 1 application topically 2 (two) times daily.    . ferrous sulfate (QC FERROUS SULFATE) 325 (65 FE) MG tablet Take 325 mg by mouth daily with breakfast.    . levothyroxine (SYNTHROID, LEVOTHROID) 50 MCG tablet Take 1 tablet (50 mcg total) by mouth every morning. 30 tablet 6  . metoprolol tartrate (LOPRESSOR) 25 MG tablet Take 1 tablet (25 mg total) by mouth 3 (three) times daily. 180 tablet 3  . Rivaroxaban (XARELTO) 15 MG TABS tablet Take 1 tablet (15 mg total) by mouth daily with supper. 42 tablet 3  . Vitamin D, Ergocalciferol, (DRISDOL) 50000 units CAPS capsule Take 50,000 Units by mouth every 30 (thirty) days.      Review of Systems General does not complaining fever or chills very much wants to go home  Skin does not complain of rashes or itching or any diaphoresis.  Head ears eyes nose mouth and throat does not complaining of any sore throat or visual changes.  Respiratory has somewhat chronic shortness of breath at times-- says this is not new and has not worsened does not complain of cough  currently.  Cardiac does not complain of chest pain or increased lower extremity swelling-does have A. fib   GI is not complaining of nausea vomiting diarrhea constipation or abdominal discomfort.  Musculoskeletal says he's gotten stronger now ambulates with a walker does not complain of joint pain.  Neurologic is not complaining of dizziness tremors or syncope weakness or lightheadedness or numbness.  Psych continues to be good spirits does not complain of overt anxiety or depression.     There is no immunization history on file for this patient. Pertinent  Health Maintenance Due  Topic Date Due  . INFLUENZA VACCINE  12/10/2015 (Originally 08/10/2015)  . PNA vac Low Risk Adult (1 of 2 - PCV13) 10/09/2016 (Originally 02/28/2005)  . COLONOSCOPY  01/16/2021   Fall Risk  07/02/2015 12/31/2014  Falls in the past year? No No   Functional Status Survey:    Vitals:   10/29/15 1354  BP: 122/80  Pulse: 75  Resp: 19  Temp: 97.6 F (36.4 C)  TempSrc:  Oral   There is no height or weight on file to calculate BMI. Physical Exam In general this is a very pleasant elderly male in no distress sitting comfortably in his chair continues to deny any increased weakness dizziness or syncopal-type feelings.  Skin is warm and dry.  Eyes pupils appear reactive to light sclera and conjunctivae are clear visual acuity appears grossly intact.  Oropharynx he has numerous extractions oropharynx clear mucous membranes moist.  Chest is clear to auscultation there is no labored breathing. Has shallow air entry which appears to be baseline  Heart is regular irregular rate and rhythm with variable pulses got 120 earlier today but this did moderate down below 100 on reexam later. Has  fairly minimal lower extremity edema  Abdomen is soft nontender positive bowel sounds there is a well-healed midline abdominal surgical scar.  Muscle skeletal is able to stand without assistance and uses  walker-- minimal  lower extremity edema apparently this baseline  Neurologic is grossly intact to speech is clear no lateralizing findings.  Psych he is alert and oriented pleasant and appropriate Labs reviewed: Labs reviewed:  Recent Labs  09/28/15 0427  10/01/15 0349  10/05/15 0641 10/06/15 0613  10/15/15 1251 10/21/15 0500 10/28/15 0500  NA 139  < > 138  < > 139 140  < > 137 137 136  K 5.5*  < > 3.9  < > 3.1* 3.4*  < > 4.0 3.7 4.3  CL 111  < > 104  < > 102 106  < > 101 107 105  CO2 24  < > 27  < > 28 27  < > 25 25 24   GLUCOSE 104*  < > 95  < > 113* 123*  < > 95 100* 98  BUN 22*  < > 14  < > 14 18  < > 24 25* 32*  CREATININE 2.71*  < > 1.72*  < > 1.76* 1.75*  < > 1.86* 1.81* 1.87*  CALCIUM 7.4*  < > 7.4*  < > 7.5* 7.4*  < > 8.8 8.8* 9.0  MG 1.3*  < > 1.6*  --  1.4* 1.9  --   --   --   --   PHOS 4.2  --   --   --   --   --   --   --   --   --   < > = values in this interval not displayed.  Recent Labs  10/03/15 0648 10/21/15 0500 10/28/15 0500  AST 20 16 17   ALT 16* 14* 13*  ALKPHOS 68 48 49  BILITOT 0.7 0.3 0.6  PROT 5.9* 6.8 7.1  ALBUMIN 1.8* 2.5* 2.8*    Recent Labs  09/27/15 0435  10/12/15 0730  10/15/15 1251 10/21/15 0500 10/28/15 0500  WBC 13.0*  < > 9.5  < > 8.9 9.1 8.8  NEUTROABS 9.3*  --  4.7  --   --   --   --   HGB 10.8*  < > 9.3*  < > 10.1* 9.6* 9.6*  HCT 34.2*  < > 28.9*  < > 30.1* 29.2* 29.5*  MCV 101.8*  < > 97.0  < > 91.8 94.8 95.8  PLT 337  < > 512*  < > 491* 334 338  < > = values in this interval not displayed. Lab Results  Component Value Date   TSH 1.673 09/23/2015   No results found for: HGBA1C Lab Results  Component Value Date   CHOL  02/04/2009    142        ATP III CLASSIFICATION:  <200     mg/dL   Desirable  200-239  mg/dL   Borderline High  >=240    mg/dL   High          HDL 33 (L) 02/04/2009   LDLCALC  02/04/2009    85        Total Cholesterol/HDL:CHD Risk Coronary Heart Disease Risk Table                     Men    Women  1/2 Average Risk   3.4   3.3  Average Risk       5.0   4.4  2 X Average Risk   9.6   7.1  3 X Average Risk  23.4   11.0        Use the calculated Patient Ratio above and the CHD Risk Table to determine the patient's CHD Risk.        ATP III CLASSIFICATION (LDL):  <100     mg/dL   Optimal  100-129  mg/dL   Near or Above                    Optimal  130-159  mg/dL   Borderline  160-189  mg/dL   High  >190     mg/dL   Very High   TRIG 122 02/04/2009   CHOLHDL 4.3 02/04/2009    Significant Diagnostic Results in last 30 days:  No results found.  Assessment/Plan  #1 atrial fibrillation with rapid ventricular rate-he is now on Lopressor 25 mg 3 times a day is on Xarelto for anticoagulation-r He was seen by cardiology this afternoon and thought stable for discharge-again pulse does come down it is not consistently elevated it appears blood pressure is stable today he is not complaining of any dizziness syncope chest pain palpitations will have cardiology follow-up s.      #2 history of chronic kidney disease as noted above creatinine today at 1.87 with BUN of 32 is relatively baseline and fluids are being encouraged will need follow-up by primary care provider.  #3 history of anemia postop hemoglobin at 9.6  Shows  stability he has been started on iron.  #4 history hypothyroidism he is on Synthroid TSH recently done in hospital was within normal range at 1.67.  #5 history CHF cardiomegaly he is now off Lasix nonetheless weight appears to be stable edema appears relatively at baseline about to be stable by cardiology he will be followed by cardiology  #6 history of strangulated right inguinal hernia with small bowel obstruction this was surgically repaired he appears to be doing well with his rehabilitation he will need continued PT and OT at home for strengthening but his progress has been encouraging he is now using a walker.  #7 history of protein calorie  malnutrition again he is eating and drinking better albumin has responded it is now 2.8 which is up from 1.8 level in the hospital.  Patient will be going home he does have very supportive sons were actually with him this afternoon-he will need continued PT and OT as well as home health follow-up for his multiple medical issues.  [id number] note greater than 30 minutes spent on this discharge summary-greater than 50% of time spent coordinating plan of care for numerous diagnoses       Oralia Manis, [place]

## 2015-10-29 NOTE — Patient Instructions (Addendum)
Medication Instructions:  NO CHANGES  Follow-Up:  Please keep your upcoming appointment on 12/20/15 at 10:15am with Dr. Sallyanne Kuster.    Any Other Special Instructions  Are Listed Below:  We will talk with Dr. Sallyanne Kuster   If you need a refill on your cardiac medications before your next appointment, please call your pharmacy.

## 2015-10-29 NOTE — Progress Notes (Signed)
Cardiology Office Note   Date:  10/31/2015   ID:  [name] [name], DOB 04/13/1940, MRN [id number]  PCP:  [name], MD  Cardiologist:  Dr. Sallyanne Kuster 08/2015  Rosaria Ferries, PA-C 10/15/2015     Chief Complaint  Patient presents with  . Atrial Fibrillation    History of Present Illness: [name] [name] is a 75 y.o. male with a history of  chronic afib on coumadin>>Xarelto, hypothyroid, malnutrition, HTN, prostate CA, CKD III, NICM felt 2nd rapid afib w/ mildly abnl MV 2011, EF nl by echo 2013, obesity  D/c 09/27 after partial small bowel resection complicated by AKI on CKD 10/06, seen in the office and had significant orthostatic hypotension, IV fluids given in the office in meds adjusted. Atrial fibrillation was rapid, felt secondary to the hypovolemia, early follow-up planned  [name] [name] presents for follow up and assessment of his volume status. His 2 sons are here with him today. If he is considered stable from a cardiology standpoint, he will be discharged from rehab to go home with one of his sons.  He is doing much better than previously. He no longer has orthostatic dizziness. He is participating in physical therapy and feels his strength and stamina are improving, but slowly. He has minimal daytime LE edema. He denies orthopnea and PND. He is compliant with his medications and his sons will make sure he takes them as an outpatient.   His heart rate is still going high with activity, but his BP will not allow for titration of his medications. He has no palpitations and is not aware of his atrial fib. When he sits still, His heart rate will drop into the 80s or 90s.   Past Medical History:  Diagnosis Date  . Cardiomyopathy [facility])    h/o felt 2nd atrial fib, EF normal by echo 2013  . Chronic anticoagulation   . Chronic atrial fibrillation ([place])   . Chronic kidney disease, stage 3   . Hypothyroidism   . Pneumonia   . Prostate cancer ([place])   .  Shortness of breath   . Systemic hypertension     Past Surgical History:  Procedure Laterality Date  . CARDIOVERSION    . CATARACT EXTRACTION W/PHACO  04/17/2011   Procedure: CATARACT EXTRACTION PHACO AND INTRAOCULAR LENS PLACEMENT (IOC);  Surgeon: [name], MD;  Location: [facility];  Service: Ophthalmology;  Laterality: Left;  CDE:14.58  . CATARACT EXTRACTION W/PHACO  05/22/2011   Procedure: CATARACT EXTRACTION PHACO AND INTRAOCULAR LENS PLACEMENT (IOC);  Surgeon: [name], MD;  Location: [facility];  Service: Ophthalmology;  Laterality: Right;  CDE 13.02  . COLONOSCOPY  01/17/2011   Procedure: COLONOSCOPY;  Surgeon: Jamesetta So;  Location: [facility];  Service: Gastroenterology;  Laterality: N/A;  . INGUINAL HERNIA REPAIR Right 09/27/2015   Procedure: RIGHT INGUINAL HERNIORRHAPY WITH MESH SMALL BOWEL RESECTION;  Surgeon: Aviva Signs, MD;  Location: [facility] ORS;  Service: General;  Laterality: Right;  . NM MYOCAR [place]  02/2009   dipyridamole; mild perfusion defect due to infarct/scar with mild perinfarct ischemia is apical septal, apical basal inferoseptal, basal inferior, mid inferoseptal mid inferior & apical inferior; EKG negative for ischemia; low risk scan  . PROSTATE SURGERY     7 years ago [facility] Dr Javaid-prostatectomy  . PROSTATECTOMY    . Sleep Study  05/2011   increased upper airway resistance syndrome with mild sleep apnea during REM  . TRANSTHORACIC ECHOCARDIOGRAM  04/2011  EF=50-55%; RV mildly dilated; LA mild-mod dilated; mild mitral valve annular calcif, mild MR; mod TR, RVSP elevated 30-52mmHg, mild pulm HTN; AV mildly sclerotic, mild calcif of AV leaflets; trace pulm valve regurg    Prior to Admission medications   Medication Sig Start Date End Date Taking? Authorizing Provider  acetaminophen (TYLENOL) 500 MG tablet Take 1 tablet (500 mg total) by mouth every 6 (six) hours as needed for mild pain or headache. 10/06/15  Yes Samuella Cota, MD  [facility] Oil  [facility]) OINT Apply to sacrum and bilateral buttocks q shifts prn for blanche every shift   Yes Historical Provider, MD  Calcium Carbonate-Vitamin D (CALCIUM 600+D) 600-400 MG-UNIT tablet Take 1 tablet by mouth 2 (two) times daily.   Yes Historical Provider, MD  clotrimazole (LOTRIMIN) 1 % cream Apply 1 application topically 2 (two) times daily.   Yes Historical Provider, MD  ferrous sulfate (QC FERROUS SULFATE) 325 (65 FE) MG tablet Take 325 mg by mouth daily with breakfast.   Yes Historical Provider, MD  levothyroxine (SYNTHROID, LEVOTHROID) 50 MCG tablet Take 1 tablet (50 mcg total) by mouth every morning. 07/02/15  Yes [name], MD  metoprolol tartrate (LOPRESSOR) 25 MG tablet Take 1 tablet (25 mg total) by mouth 3 (three) times daily. 10/15/15 01/13/16 Yes Aria Pickrell G My Rinke, PA-C  Rivaroxaban (XARELTO) 15 MG TABS tablet Take 1 tablet (15 mg total) by mouth daily with supper. 10/15/15  Yes Kerron Sedano G Yue Flanigan, PA-C  Vitamin D, Ergocalciferol, (DRISDOL) 50000 units CAPS capsule Take 50,000 Units by mouth every 30 (thirty) days.   Yes Historical Provider, MD    Allergies:   Review of patient's allergies indicates no known allergies.    Social History:  The patient  reports that he has never smoked. He has never used smokeless tobacco. He reports that he does not drink alcohol or use drugs.   Family History:  The patient's family history includes Diabetes in his mother; Prostate cancer in his father.    ROS:  Please see the history of present illness. All other systems are reviewed and negative.    PHYSICAL EXAM: VS:  BP 111/74   Pulse (!) 121   Ht 6' 2.5" (1.892 m)   Wt 237 lb (107.5 kg)   SpO2 97%   BMI 30.02 kg/m  , BMI Body mass index is 30.02 kg/m. GEN: Well nourished, well developed, male in no acute distress  HEENT: normal for age  Neck: no JVD, no carotid bruit, no masses Cardiac: Irreg R&R; no murmur, no rubs, or gallops Respiratory:  Decreased breath sounds bases  bilaterally, normal work of breathing GI: soft, nontender, nondistended, + BS MS: no deformity or atrophy; no edema; distal pulses are 2+ in all 4 extremities   Skin: warm and dry, no rash Neuro:  Strength and sensation are intact but generally weak Psych: euthymic mood, full affect   EKG:  EKG is ordered today. The ekg ordered today demonstrates atrial fibrillation, RVR, heart rate 121   Recent Labs: 09/23/2015: TSH 1.673 10/06/2015: Magnesium 1.9 10/28/2015: ALT 13; BUN 32; Creatinine, Ser 1.87; Hemoglobin 9.6; Platelets 338; Potassium 4.3; Sodium 136    Lipid Panel    Component Value Date/Time   CHOL  02/04/2009 0538    142        ATP III CLASSIFICATION:  <200     mg/dL   Desirable  200-239  mg/dL   Borderline High  >=240    mg/dL   High  TRIG 122 02/04/2009 0538   HDL 33 (L) 02/04/2009 0538   CHOLHDL 4.3 02/04/2009 0538   VLDL 24 02/04/2009 0538   LDLCALC  02/04/2009 0538    85            Wt Readings from Last 3 Encounters:  10/29/15 237 lb (107.5 kg)  10/15/15 233 lb 6.4 oz (105.9 kg)  10/06/15 242 lb 1 oz (109.8 kg)     Other studies Reviewed: Additional studies/ records that were reviewed today include: Office notes, facility records and testing.  ASSESSMENT AND PLAN:  1.  Rapid atrial fibrillation: His volume status appears good today. He is not complaining of any orthostatic symptoms. He's been able to participate in cardiac rehabilitation strength is increasing slowly. He is to continue the metoprolol 25 mg 3 times a day. Heart rate has been as low as 59, generally in the 80s or above.  Will discuss with Dr. Sallyanne Kuster adding low-dose diltiazem at 30 mg twice a day with the first 2 doses of metoprolol versus amiodarone to try and help improve his heart rate when he is more active.  2. Chronic anticoagulation: He is tolerating Xarelto.CHA2DS2VASc=3 (age x 2, HTN)  3. Dyspnea on exertion, Weakness and deconditioning: He is scheduled to have physical  therapy as an outpatient. He will be living with his son, his daughter-in-law). His other son also be able to help in his care. With good family support, and the patient understanding that he needs to be very careful with getting up and around, I feel that he can be safely discharged from a cardiac standpoint. As he is able to get stronger, his dyspnea on exertion should improve. He does not have volume overload by exam.  4. CKD III: Recent lab work showed a BUN slightly higher than his norm and a creatinine that is elevated but normal for him. Family to continue to track daily weights. Not on diuretic or ACE/ARB now.   Current medicines are reviewed at length with the patient today.  The patient does not have concerns regarding medicines.  The following changes have been made:  no change  Labs/ tests ordered today include:   Orders Placed This Encounter  Procedures  . EKG 12-Lead    Disposition:   FU with Dr. Sallyanne Kuster  Signed, Rosaria Ferries, PA-C  10/31/2015 6:13 PM    [facility] Phone: [phone]; Fax: [phone]  This note was written with the assistance of speech recognition software. Please excuse any transcriptional errors.

## 2015-10-31 ENCOUNTER — Encounter: Payer: Self-pay | Admitting: Physician Assistant

## 2015-11-01 DIAGNOSIS — Z8546 Personal history of malignant neoplasm of prostate: Secondary | ICD-10-CM | POA: Diagnosis not present

## 2015-11-01 DIAGNOSIS — I509 Heart failure, unspecified: Secondary | ICD-10-CM | POA: Diagnosis not present

## 2015-11-01 DIAGNOSIS — Z48815 Encounter for surgical aftercare following surgery on the digestive system: Secondary | ICD-10-CM | POA: Diagnosis not present

## 2015-11-01 DIAGNOSIS — I130 Hypertensive heart and chronic kidney disease with heart failure and stage 1 through stage 4 chronic kidney disease, or unspecified chronic kidney disease: Secondary | ICD-10-CM | POA: Diagnosis not present

## 2015-11-01 DIAGNOSIS — E46 Unspecified protein-calorie malnutrition: Secondary | ICD-10-CM | POA: Diagnosis not present

## 2015-11-01 DIAGNOSIS — Z7901 Long term (current) use of anticoagulants: Secondary | ICD-10-CM | POA: Diagnosis not present

## 2015-11-01 DIAGNOSIS — N183 Chronic kidney disease, stage 3 (moderate): Secondary | ICD-10-CM | POA: Diagnosis not present

## 2015-11-01 DIAGNOSIS — I482 Chronic atrial fibrillation: Secondary | ICD-10-CM | POA: Diagnosis not present

## 2015-11-01 DIAGNOSIS — M6281 Muscle weakness (generalized): Secondary | ICD-10-CM | POA: Diagnosis not present

## 2015-11-01 NOTE — Progress Notes (Signed)
I think we do best to go with the amiodarone, [name]. [name], can we please have him start amiodarone 200 mg twice daily for 2 weeks then 200 mg daily after that? MCr

## 2015-11-03 DIAGNOSIS — Z6831 Body mass index (BMI) 31.0-31.9, adult: Secondary | ICD-10-CM | POA: Diagnosis not present

## 2015-11-03 DIAGNOSIS — I4891 Unspecified atrial fibrillation: Secondary | ICD-10-CM | POA: Diagnosis not present

## 2015-11-03 DIAGNOSIS — S36409D Unspecified injury of unspecified part of small intestine, subsequent encounter: Secondary | ICD-10-CM | POA: Diagnosis not present

## 2015-11-03 DIAGNOSIS — N184 Chronic kidney disease, stage 4 (severe): Secondary | ICD-10-CM | POA: Diagnosis not present

## 2015-11-03 DIAGNOSIS — K5669 Other partial intestinal obstruction: Secondary | ICD-10-CM | POA: Diagnosis not present

## 2015-11-03 DIAGNOSIS — M25552 Pain in left hip: Secondary | ICD-10-CM | POA: Diagnosis not present

## 2015-11-03 DIAGNOSIS — E441 Mild protein-calorie malnutrition: Secondary | ICD-10-CM | POA: Diagnosis not present

## 2015-11-04 ENCOUNTER — Other Ambulatory Visit (HOSPITAL_COMMUNITY): Payer: Self-pay | Admitting: Family Medicine

## 2015-11-04 ENCOUNTER — Telehealth: Payer: Self-pay

## 2015-11-04 ENCOUNTER — Ambulatory Visit (HOSPITAL_COMMUNITY)
Admission: RE | Admit: 2015-11-04 | Discharge: 2015-11-04 | Disposition: A | Discharge status: Home / Self Care | Payer: PPO | Source: Ambulatory Visit | Attending: Family Medicine | Admitting: Family Medicine

## 2015-11-04 DIAGNOSIS — R52 Pain, unspecified: Secondary | ICD-10-CM

## 2015-11-04 DIAGNOSIS — M25552 Pain in left hip: Secondary | ICD-10-CM | POA: Diagnosis not present

## 2015-11-04 DIAGNOSIS — I482 Chronic atrial fibrillation: Secondary | ICD-10-CM | POA: Diagnosis not present

## 2015-11-04 DIAGNOSIS — I509 Heart failure, unspecified: Secondary | ICD-10-CM | POA: Diagnosis not present

## 2015-11-04 DIAGNOSIS — Z8546 Personal history of malignant neoplasm of prostate: Secondary | ICD-10-CM | POA: Diagnosis not present

## 2015-11-04 DIAGNOSIS — I130 Hypertensive heart and chronic kidney disease with heart failure and stage 1 through stage 4 chronic kidney disease, or unspecified chronic kidney disease: Secondary | ICD-10-CM | POA: Diagnosis not present

## 2015-11-04 DIAGNOSIS — E46 Unspecified protein-calorie malnutrition: Secondary | ICD-10-CM | POA: Diagnosis not present

## 2015-11-04 DIAGNOSIS — N183 Chronic kidney disease, stage 3 (moderate): Secondary | ICD-10-CM | POA: Diagnosis not present

## 2015-11-04 DIAGNOSIS — M1612 Unilateral primary osteoarthritis, left hip: Secondary | ICD-10-CM | POA: Insufficient documentation

## 2015-11-04 DIAGNOSIS — Z48815 Encounter for surgical aftercare following surgery on the digestive system: Secondary | ICD-10-CM | POA: Diagnosis not present

## 2015-11-04 DIAGNOSIS — Z7901 Long term (current) use of anticoagulants: Secondary | ICD-10-CM | POA: Diagnosis not present

## 2015-11-04 NOTE — Telephone Encounter (Signed)
lmtcb

## 2015-11-04 NOTE — Telephone Encounter (Signed)
-----   Message from [name], MD sent at 11/01/2015 11:41 AM EDT -----   ----- Message ----- From: Lonn Georgia, PA-C Sent: 11/01/2015   9:36 AM To: [name], MD

## 2015-11-05 NOTE — Telephone Encounter (Signed)
Pt son is returning your call

## 2015-11-08 ENCOUNTER — Encounter (INDEPENDENT_AMBULATORY_CARE_PROVIDER_SITE_OTHER): Payer: Self-pay | Admitting: Internal Medicine

## 2015-11-08 ENCOUNTER — Encounter (INDEPENDENT_AMBULATORY_CARE_PROVIDER_SITE_OTHER): Payer: Self-pay

## 2015-11-08 DIAGNOSIS — M1612 Unilateral primary osteoarthritis, left hip: Secondary | ICD-10-CM | POA: Diagnosis not present

## 2015-11-09 ENCOUNTER — Telehealth: Payer: Self-pay | Admitting: Cardiovascular Disease

## 2015-11-09 NOTE — Telephone Encounter (Signed)
Home Health RN wants to know what the patient's Na intake should be per day. Please call son, [name], with that information.

## 2015-11-09 NOTE — Telephone Encounter (Signed)
Left message for son, usually recommend 2000 mg daily. To call back with further questions.

## 2015-11-10 DIAGNOSIS — I509 Heart failure, unspecified: Secondary | ICD-10-CM | POA: Diagnosis not present

## 2015-11-10 DIAGNOSIS — Z7901 Long term (current) use of anticoagulants: Secondary | ICD-10-CM | POA: Diagnosis not present

## 2015-11-10 DIAGNOSIS — Z8546 Personal history of malignant neoplasm of prostate: Secondary | ICD-10-CM | POA: Diagnosis not present

## 2015-11-10 DIAGNOSIS — N183 Chronic kidney disease, stage 3 (moderate): Secondary | ICD-10-CM | POA: Diagnosis not present

## 2015-11-10 DIAGNOSIS — Z48815 Encounter for surgical aftercare following surgery on the digestive system: Secondary | ICD-10-CM | POA: Diagnosis not present

## 2015-11-10 DIAGNOSIS — I482 Chronic atrial fibrillation: Secondary | ICD-10-CM | POA: Diagnosis not present

## 2015-11-10 DIAGNOSIS — I130 Hypertensive heart and chronic kidney disease with heart failure and stage 1 through stage 4 chronic kidney disease, or unspecified chronic kidney disease: Secondary | ICD-10-CM | POA: Diagnosis not present

## 2015-11-10 DIAGNOSIS — E46 Unspecified protein-calorie malnutrition: Secondary | ICD-10-CM | POA: Diagnosis not present

## 2015-11-10 NOTE — Telephone Encounter (Signed)
lmtcb

## 2015-11-11 DIAGNOSIS — I130 Hypertensive heart and chronic kidney disease with heart failure and stage 1 through stage 4 chronic kidney disease, or unspecified chronic kidney disease: Secondary | ICD-10-CM | POA: Diagnosis not present

## 2015-11-11 DIAGNOSIS — I509 Heart failure, unspecified: Secondary | ICD-10-CM | POA: Diagnosis not present

## 2015-11-11 DIAGNOSIS — N183 Chronic kidney disease, stage 3 (moderate): Secondary | ICD-10-CM | POA: Diagnosis not present

## 2015-11-11 DIAGNOSIS — Z7901 Long term (current) use of anticoagulants: Secondary | ICD-10-CM | POA: Diagnosis not present

## 2015-11-11 DIAGNOSIS — Z8546 Personal history of malignant neoplasm of prostate: Secondary | ICD-10-CM | POA: Diagnosis not present

## 2015-11-11 DIAGNOSIS — Z48815 Encounter for surgical aftercare following surgery on the digestive system: Secondary | ICD-10-CM | POA: Diagnosis not present

## 2015-11-11 DIAGNOSIS — I482 Chronic atrial fibrillation: Secondary | ICD-10-CM | POA: Diagnosis not present

## 2015-11-11 DIAGNOSIS — E46 Unspecified protein-calorie malnutrition: Secondary | ICD-10-CM | POA: Diagnosis not present

## 2015-11-11 MED ORDER — AMIODARONE HCL 200 MG PO TABS: ORAL_TABLET | ORAL | 3 refills | Status: DC

## 2015-11-11 NOTE — Telephone Encounter (Signed)
Patient's son [name] returned call.  Reviewed medication instructions with him. Notified I would send a prescription to patient's preferred pharmacy with new instructions.

## 2015-11-11 NOTE — Telephone Encounter (Signed)
[name], MD at 10/29/2015 3:00 PM   Status: Signed    I think we do best to go with the amiodarone, [name]. [name], can we please have him start amiodarone 200 mg twice daily for 2 weeks then 200 mg daily after that? MCr

## 2015-11-11 NOTE — Telephone Encounter (Signed)
Pt says he is unable to get calls at work,he will call later today.

## 2015-11-12 DIAGNOSIS — I509 Heart failure, unspecified: Secondary | ICD-10-CM | POA: Diagnosis not present

## 2015-11-12 DIAGNOSIS — Z48815 Encounter for surgical aftercare following surgery on the digestive system: Secondary | ICD-10-CM | POA: Diagnosis not present

## 2015-11-12 DIAGNOSIS — N183 Chronic kidney disease, stage 3 (moderate): Secondary | ICD-10-CM | POA: Diagnosis not present

## 2015-11-12 DIAGNOSIS — Z7901 Long term (current) use of anticoagulants: Secondary | ICD-10-CM | POA: Diagnosis not present

## 2015-11-12 DIAGNOSIS — I130 Hypertensive heart and chronic kidney disease with heart failure and stage 1 through stage 4 chronic kidney disease, or unspecified chronic kidney disease: Secondary | ICD-10-CM | POA: Diagnosis not present

## 2015-11-12 DIAGNOSIS — I482 Chronic atrial fibrillation: Secondary | ICD-10-CM | POA: Diagnosis not present

## 2015-11-12 DIAGNOSIS — E46 Unspecified protein-calorie malnutrition: Secondary | ICD-10-CM | POA: Diagnosis not present

## 2015-11-12 DIAGNOSIS — Z8546 Personal history of malignant neoplasm of prostate: Secondary | ICD-10-CM | POA: Diagnosis not present

## 2015-11-15 DIAGNOSIS — M1612 Unilateral primary osteoarthritis, left hip: Secondary | ICD-10-CM | POA: Diagnosis not present

## 2015-11-16 DIAGNOSIS — I509 Heart failure, unspecified: Secondary | ICD-10-CM | POA: Diagnosis not present

## 2015-11-16 DIAGNOSIS — Z48815 Encounter for surgical aftercare following surgery on the digestive system: Secondary | ICD-10-CM | POA: Diagnosis not present

## 2015-11-16 DIAGNOSIS — E46 Unspecified protein-calorie malnutrition: Secondary | ICD-10-CM | POA: Diagnosis not present

## 2015-11-16 DIAGNOSIS — N183 Chronic kidney disease, stage 3 (moderate): Secondary | ICD-10-CM | POA: Diagnosis not present

## 2015-11-16 DIAGNOSIS — I130 Hypertensive heart and chronic kidney disease with heart failure and stage 1 through stage 4 chronic kidney disease, or unspecified chronic kidney disease: Secondary | ICD-10-CM | POA: Diagnosis not present

## 2015-11-16 DIAGNOSIS — I482 Chronic atrial fibrillation: Secondary | ICD-10-CM | POA: Diagnosis not present

## 2015-11-16 DIAGNOSIS — Z7901 Long term (current) use of anticoagulants: Secondary | ICD-10-CM | POA: Diagnosis not present

## 2015-11-16 DIAGNOSIS — Z8546 Personal history of malignant neoplasm of prostate: Secondary | ICD-10-CM | POA: Diagnosis not present

## 2015-11-17 DIAGNOSIS — N183 Chronic kidney disease, stage 3 (moderate): Secondary | ICD-10-CM | POA: Diagnosis not present

## 2015-11-17 DIAGNOSIS — Z8546 Personal history of malignant neoplasm of prostate: Secondary | ICD-10-CM | POA: Diagnosis not present

## 2015-11-17 DIAGNOSIS — I130 Hypertensive heart and chronic kidney disease with heart failure and stage 1 through stage 4 chronic kidney disease, or unspecified chronic kidney disease: Secondary | ICD-10-CM | POA: Diagnosis not present

## 2015-11-17 DIAGNOSIS — Z48815 Encounter for surgical aftercare following surgery on the digestive system: Secondary | ICD-10-CM | POA: Diagnosis not present

## 2015-11-17 DIAGNOSIS — I509 Heart failure, unspecified: Secondary | ICD-10-CM | POA: Diagnosis not present

## 2015-11-17 DIAGNOSIS — E46 Unspecified protein-calorie malnutrition: Secondary | ICD-10-CM | POA: Diagnosis not present

## 2015-11-17 DIAGNOSIS — Z7901 Long term (current) use of anticoagulants: Secondary | ICD-10-CM | POA: Diagnosis not present

## 2015-11-17 DIAGNOSIS — I482 Chronic atrial fibrillation: Secondary | ICD-10-CM | POA: Diagnosis not present

## 2015-11-18 DIAGNOSIS — R3915 Urgency of urination: Secondary | ICD-10-CM | POA: Diagnosis not present

## 2015-11-18 DIAGNOSIS — R310 Gross hematuria: Secondary | ICD-10-CM | POA: Diagnosis not present

## 2015-11-18 DIAGNOSIS — Z48815 Encounter for surgical aftercare following surgery on the digestive system: Secondary | ICD-10-CM | POA: Diagnosis not present

## 2015-11-18 DIAGNOSIS — C61 Malignant neoplasm of prostate: Secondary | ICD-10-CM | POA: Diagnosis not present

## 2015-11-18 DIAGNOSIS — I482 Chronic atrial fibrillation: Secondary | ICD-10-CM | POA: Diagnosis not present

## 2015-11-18 DIAGNOSIS — E46 Unspecified protein-calorie malnutrition: Secondary | ICD-10-CM | POA: Diagnosis not present

## 2015-11-18 DIAGNOSIS — N183 Chronic kidney disease, stage 3 (moderate): Secondary | ICD-10-CM | POA: Diagnosis not present

## 2015-11-18 DIAGNOSIS — I130 Hypertensive heart and chronic kidney disease with heart failure and stage 1 through stage 4 chronic kidney disease, or unspecified chronic kidney disease: Secondary | ICD-10-CM | POA: Diagnosis not present

## 2015-11-18 DIAGNOSIS — N3001 Acute cystitis with hematuria: Secondary | ICD-10-CM | POA: Diagnosis not present

## 2015-11-18 DIAGNOSIS — I509 Heart failure, unspecified: Secondary | ICD-10-CM | POA: Diagnosis not present

## 2015-11-18 DIAGNOSIS — Z7901 Long term (current) use of anticoagulants: Secondary | ICD-10-CM | POA: Diagnosis not present

## 2015-11-18 DIAGNOSIS — Z8546 Personal history of malignant neoplasm of prostate: Secondary | ICD-10-CM | POA: Diagnosis not present

## 2015-11-19 ENCOUNTER — Other Ambulatory Visit: Payer: Self-pay | Admitting: *Deleted

## 2015-11-19 DIAGNOSIS — Z48815 Encounter for surgical aftercare following surgery on the digestive system: Secondary | ICD-10-CM | POA: Diagnosis not present

## 2015-11-19 DIAGNOSIS — Z7901 Long term (current) use of anticoagulants: Secondary | ICD-10-CM | POA: Diagnosis not present

## 2015-11-19 DIAGNOSIS — I130 Hypertensive heart and chronic kidney disease with heart failure and stage 1 through stage 4 chronic kidney disease, or unspecified chronic kidney disease: Secondary | ICD-10-CM | POA: Diagnosis not present

## 2015-11-19 DIAGNOSIS — I482 Chronic atrial fibrillation: Secondary | ICD-10-CM | POA: Diagnosis not present

## 2015-11-19 DIAGNOSIS — I509 Heart failure, unspecified: Secondary | ICD-10-CM | POA: Diagnosis not present

## 2015-11-19 DIAGNOSIS — N183 Chronic kidney disease, stage 3 (moderate): Secondary | ICD-10-CM | POA: Diagnosis not present

## 2015-11-19 DIAGNOSIS — Z8546 Personal history of malignant neoplasm of prostate: Secondary | ICD-10-CM | POA: Diagnosis not present

## 2015-11-19 DIAGNOSIS — E46 Unspecified protein-calorie malnutrition: Secondary | ICD-10-CM | POA: Diagnosis not present

## 2015-11-19 NOTE — Patient Outreach (Signed)
[facility]) Care Management  11/19/2015  [name] [name] 06-02-1940 [id number]  [facility] referral; recent hospital discharge. [facility].  Hospital admission [place]-09/18/15-10/06/15 [facility] 09/27-10/20/2017  Telephone call to patient who was advised of reason for call & [facility] care management services.  HIPPA  verification received from patient.  Patient voices that he was recently at [facility] where he has hernia with bowel obstruction & rapid heart rate. States he had some of intestine removed. Patient voices that he went from hospital to skilled facility for rehabilitation and from there to home. States he is currently staying with his son & daughter-in-law until he gets stronger.  Voices that he has [facility] care services for physical therapy. Voices that he is independent in his personal care & uses cane or walker for safety reasons.   Voices that his son takes him to all of his MD appointments & also fixes his medication box. Patient voices that he takes his medications consistently & understands the importance of taking as prescribed by his doctors.   Patient states current weight is 236 pounds & when admitted to hospital was 260 pounds. States he is eating well & working hard increase his strength.   Patient consents to [facility] care management services.  Current address -[address], [place].    Plan: Send to care management assistant to assign to [facility] care coordinator for complex case management . Patient is recent discharge from skilled facility 10/29/15. DXS chronic atrial fibrillation, post partial bowel resection (strangulated hernia with bowel obstruction), hypothyroidism, HTN, kidney disease.   Sherrin Daisy, RN BSN [place] Management Coordinator [facility] Care Management  [phone]

## 2015-11-22 ENCOUNTER — Encounter (INDEPENDENT_AMBULATORY_CARE_PROVIDER_SITE_OTHER): Payer: Self-pay | Admitting: Internal Medicine

## 2015-11-22 ENCOUNTER — Other Ambulatory Visit: Payer: Self-pay | Admitting: *Deleted

## 2015-11-22 ENCOUNTER — Ambulatory Visit (INDEPENDENT_AMBULATORY_CARE_PROVIDER_SITE_OTHER): Payer: PPO | Admitting: Internal Medicine

## 2015-11-22 VITALS — BP 104/58 | HR 80 | Temp 97.6°F | Ht 74.6 in | Wt 237.9 lb

## 2015-11-22 DIAGNOSIS — D509 Iron deficiency anemia, unspecified: Secondary | ICD-10-CM | POA: Diagnosis not present

## 2015-11-22 DIAGNOSIS — N183 Chronic kidney disease, stage 3 (moderate): Secondary | ICD-10-CM | POA: Diagnosis not present

## 2015-11-22 DIAGNOSIS — E559 Vitamin D deficiency, unspecified: Secondary | ICD-10-CM | POA: Diagnosis not present

## 2015-11-22 DIAGNOSIS — I10 Essential (primary) hypertension: Secondary | ICD-10-CM | POA: Diagnosis not present

## 2015-11-22 DIAGNOSIS — K625 Hemorrhage of anus and rectum: Secondary | ICD-10-CM | POA: Diagnosis not present

## 2015-11-22 DIAGNOSIS — R809 Proteinuria, unspecified: Secondary | ICD-10-CM | POA: Diagnosis not present

## 2015-11-22 LAB — FERRITIN: Ferritin: 327 ng/mL (ref 20–380)

## 2015-11-22 NOTE — Patient Outreach (Signed)
Telephone call to pt for transition of care week 1, patient's son [name] answered telephone and states to call back later, not a good time for pt to talk.  [name] [facility], [place] Coordinator [phone]

## 2015-11-22 NOTE — Progress Notes (Signed)
Subjective:    Patient ID: [name] [name], male    DOB: Mar 15, 1940, 75 y.o.   MRN: [id number]  HPI Referred by Dr. Hilma Favors for blood in stool.  Hernia surgery in September by Dr. Arnoldo Morale. (Incarerated rt internal hernia with resultant small bowel obstruction). Son states his father had blood in his stool in September after hernia surgery while in the hospital. Son states he has seen blood in his father 's stool about a week ago.  No change in his BMs per patient.  He also has seen blood in his urine and has seen Urologist and is scheduled for a CT bladder next week. His appetite is good. Low sodium diet.  Stool have remained soft.  Is on Bactrim for possible UTI.   CBC    Component Value Date/Time   WBC 8.8 10/28/2015 0500   RBC 3.08 (L) 10/28/2015 0500   HGB 9.6 (L) 10/28/2015 0500   HCT 29.5 (L) 10/28/2015 0500   PLT 338 10/28/2015 0500   MCV 95.8 10/28/2015 0500   MCH 31.2 10/28/2015 0500   MCHC 32.5 10/28/2015 0500   RDW 14.3 10/28/2015 0500   LYMPHSABS 3.1 10/12/2015 0730   MONOABS 1.2 (H) 10/12/2015 0730   EOSABS 0.4 10/12/2015 0730   BASOSABS 0.1 10/12/2015 0730     His last colonoscopy was in 2013 (screening). (Dr. Arnoldo Morale) With diverticula scattered in descending colon. Otherwise normal.   Review of Systems Past Medical History:  Diagnosis Date  . Cardiomyopathy [facility])    h/o felt 2nd atrial fib, EF normal by echo 2013  . Chronic anticoagulation   . Chronic atrial fibrillation ([place])   . Chronic kidney disease, stage 3   . Hypothyroidism   . Pneumonia   . Prostate cancer ([place])   . Shortness of breath   . Systemic hypertension     Past Surgical History:  Procedure Laterality Date  . CARDIOVERSION    . CATARACT EXTRACTION W/PHACO  04/17/2011   Procedure: CATARACT EXTRACTION PHACO AND INTRAOCULAR LENS PLACEMENT (IOC);  Surgeon: [name], MD;  Location: [facility] ORS;  Service: Ophthalmology;  Laterality: Left;  CDE:14.58  . CATARACT EXTRACTION W/PHACO  05/22/2011    Procedure: CATARACT EXTRACTION PHACO AND INTRAOCULAR LENS PLACEMENT (IOC);  Surgeon: [name], MD;  Location: [facility] ORS;  Service: Ophthalmology;  Laterality: Right;  CDE 13.02  . COLONOSCOPY  01/17/2011   Procedure: COLONOSCOPY;  Surgeon: Jamesetta So;  Location: [facility] ENDO SUITE;  Service: Gastroenterology;  Laterality: N/A;  . INGUINAL HERNIA REPAIR Right 09/27/2015   Procedure: RIGHT INGUINAL HERNIORRHAPY WITH MESH SMALL BOWEL RESECTION;  Surgeon: Aviva Signs, MD;  Location: [facility] ORS;  Service: General;  Laterality: Right;  . NM MYOCAR [place]  02/2009   dipyridamole; mild perfusion defect due to infarct/scar with mild perinfarct ischemia is apical septal, apical basal inferoseptal, basal inferior, mid inferoseptal mid inferior & apical inferior; EKG negative for ischemia; low risk scan  . PROSTATE SURGERY     7 years ago [facility] Dr Javaid-prostatectomy  . PROSTATECTOMY    . Sleep Study  05/2011   increased upper airway resistance syndrome with mild sleep apnea during REM  . TRANSTHORACIC ECHOCARDIOGRAM  04/2011   EF=50-55%; RV mildly dilated; LA mild-mod dilated; mild mitral valve annular calcif, mild MR; mod TR, RVSP elevated 30-51mmHg, mild pulm HTN; AV mildly sclerotic, mild calcif of AV leaflets; trace pulm valve regurg     No Known Allergies  Current Outpatient Prescriptions on File Prior to  Visit  Medication Sig Dispense Refill  . acetaminophen (TYLENOL) 500 MG tablet Take 1 tablet (500 mg total) by mouth every 6 (six) hours as needed for mild pain or headache.    Marland Kitchen amiodarone (PACERONE) 200 MG tablet Take 1 tablet (200 mg total) by mouth twice daily for 2 weeks THEN DECREASE to 1 tablet ONCE daily. 104 tablet 3  . Calcium Carbonate-Vitamin D (CALCIUM 600+D) 600-400 MG-UNIT tablet Take 1 tablet by mouth 2 (two) times daily.    . ferrous sulfate (QC FERROUS SULFATE) 325 (65 FE) MG tablet Take 325 mg by mouth daily with breakfast.    . levothyroxine (SYNTHROID, LEVOTHROID) 50 MCG  tablet Take 1 tablet (50 mcg total) by mouth every morning. 30 tablet 6  . metoprolol tartrate (LOPRESSOR) 25 MG tablet Take 1 tablet (25 mg total) by mouth 3 (three) times daily. 180 tablet 3  . Rivaroxaban (XARELTO) 15 MG TABS tablet Take 1 tablet (15 mg total) by mouth daily with supper. 42 tablet 3  . clotrimazole (LOTRIMIN) 1 % cream Apply 1 application topically 2 (two) times daily.     Current Facility-Administered Medications on File Prior to Visit  Medication Dose Route Frequency Provider Last Rate Last Dose  . neomycin-polymyxin-dexameth (MAXITROL) 0.1 % ophth ointment    PRN [name], MD   1 application at 48/27/07 8675       Objective:   Physical Exam Blood pressure (!) 104/58, pulse 80, temperature 97.6 F (36.4 C), height 6' 2.6" (1.895 m), weight 237 lb 14.4 oz (107.9 kg).  Alert and oriented. Skin warm and dry. Oral mucosa is moist.   . Sclera anicteric, conjunctivae is pink. Thyroid not enlarged. No cervical lymphadenopathy. Lungs clear. Heart regular rate and rhythm.  Abdomen is soft. Bowel sounds are positive. No hepatomegaly. No abdominal masses felt. No tenderness.  No edema to lower extremities.  Stool brown and guaiac negative.       Assessment & Plan:  Rectal bleeding. Stool negative today. 3 stool cards home with patient.

## 2015-11-22 NOTE — Patient Instructions (Signed)
Three stool cards x 3 sent home with patient. CBC today

## 2015-11-23 ENCOUNTER — Encounter: Payer: Self-pay | Admitting: *Deleted

## 2015-11-23 ENCOUNTER — Other Ambulatory Visit: Payer: Self-pay | Admitting: *Deleted

## 2015-11-23 LAB — CBC WITH DIFFERENTIAL/PLATELET
BASOS ABS: 0 {cells}/uL (ref 0–200)
Basophils Relative: 0 %
EOS PCT: 2 %
Eosinophils Absolute: 184 cells/uL (ref 15–500)
HCT: 38.4 % — ABNORMAL LOW (ref 38.5–50.0)
Hemoglobin: 12.5 g/dL — ABNORMAL LOW (ref 13.2–17.1)
LYMPHS PCT: 32 %
Lymphs Abs: 2944 cells/uL (ref 850–3900)
MCH: 31.5 pg (ref 27.0–33.0)
MCHC: 32.6 g/dL (ref 32.0–36.0)
MCV: 96.7 fL (ref 80.0–100.0)
MONOS PCT: 9 %
MPV: 8.9 fL (ref 7.5–12.5)
Monocytes Absolute: 828 cells/uL (ref 200–950)
NEUTROS ABS: 5244 {cells}/uL (ref 1500–7800)
NEUTROS PCT: 57 %
PLATELETS: 358 10*3/uL (ref 140–400)
RBC: 3.97 MIL/uL — ABNORMAL LOW (ref 4.20–5.80)
RDW: 15.7 % — AB (ref 11.0–15.0)
WBC: 9.2 10*3/uL (ref 3.8–10.8)

## 2015-11-23 LAB — IRON AND TIBC
%SAT: 40 % (ref 15–60)
Iron: 118 ug/dL (ref 50–180)
TIBC: 297 ug/dL (ref 250–425)
UIBC: 179 ug/dL (ref 125–400)

## 2015-11-23 MED ORDER — RIVAROXABAN 15 MG PO TABS: 15.0000 mg | ORAL_TABLET | Freq: Every day | ORAL | 1 refills | Status: DC

## 2015-11-23 NOTE — Patient Outreach (Signed)
RN CM called pt for transition of care week 1, spoke with son [name], permission given by pt, HIPAA verified, pt in hospital 9/9-9/27 for bowel resection, then in SNF and discharged 10/29/15- son [name] says pt temporarily living with him, son oversees medications, pt saw primary care MD on 10/25 after discharge from SNF, pt had home health and PT discharged.  Pt agreeable to weekly transition of care calls.  [facility] CM Care Plan Problem One   Flowsheet Row Most Recent Value  Care Plan Problem One  Pt high risk for hospital readmission related to disease process (bowel obstruction and resection, malnutrition)  Role Documenting the Problem One  Care Management Coordinator  Care Plan for Problem One  Active  THN Long Term Goal (31-90 days)  pt will have better understanding disease process to facilitate better outcomes and avoid readmission within 90 days.  THN Long Term Goal Start Date  11/23/15  Interventions for Problem One Long Term Goal  RN CM reviewed importance of attending all scheduled MD appointments, taking medications as prescribed.  THN CM Short Term Goal #1 (0-30 days)  pt will verbalize signs/ symptoms infection within 30 days.  THN CM Short Term Goal #1 Start Date  11/23/15  Interventions for Short Term Goal #1  RN cm reviewed signs and symptoms infection     PLAN Continue weekly transition of care calls See pt for home visit 12/08/15  [name] [facility], [place] Coordinator [phone]

## 2015-11-25 DIAGNOSIS — I482 Chronic atrial fibrillation: Secondary | ICD-10-CM | POA: Diagnosis not present

## 2015-11-25 DIAGNOSIS — Z8546 Personal history of malignant neoplasm of prostate: Secondary | ICD-10-CM | POA: Diagnosis not present

## 2015-11-25 DIAGNOSIS — Z7901 Long term (current) use of anticoagulants: Secondary | ICD-10-CM | POA: Diagnosis not present

## 2015-11-25 DIAGNOSIS — E46 Unspecified protein-calorie malnutrition: Secondary | ICD-10-CM | POA: Diagnosis not present

## 2015-11-25 DIAGNOSIS — Z48815 Encounter for surgical aftercare following surgery on the digestive system: Secondary | ICD-10-CM | POA: Diagnosis not present

## 2015-11-25 DIAGNOSIS — I509 Heart failure, unspecified: Secondary | ICD-10-CM | POA: Diagnosis not present

## 2015-11-25 DIAGNOSIS — N183 Chronic kidney disease, stage 3 (moderate): Secondary | ICD-10-CM | POA: Diagnosis not present

## 2015-11-25 DIAGNOSIS — I130 Hypertensive heart and chronic kidney disease with heart failure and stage 1 through stage 4 chronic kidney disease, or unspecified chronic kidney disease: Secondary | ICD-10-CM | POA: Diagnosis not present

## 2015-11-26 ENCOUNTER — Telehealth (INDEPENDENT_AMBULATORY_CARE_PROVIDER_SITE_OTHER): Payer: Self-pay | Admitting: Internal Medicine

## 2015-11-26 DIAGNOSIS — G894 Chronic pain syndrome: Secondary | ICD-10-CM | POA: Diagnosis not present

## 2015-11-26 DIAGNOSIS — Z6832 Body mass index (BMI) 32.0-32.9, adult: Secondary | ICD-10-CM | POA: Diagnosis not present

## 2015-11-26 DIAGNOSIS — Z1389 Encounter for screening for other disorder: Secondary | ICD-10-CM | POA: Diagnosis not present

## 2015-11-29 ENCOUNTER — Other Ambulatory Visit: Payer: Self-pay | Admitting: *Deleted

## 2015-11-29 ENCOUNTER — Telehealth (INDEPENDENT_AMBULATORY_CARE_PROVIDER_SITE_OTHER): Payer: Self-pay | Admitting: *Deleted

## 2015-11-29 DIAGNOSIS — R195 Other fecal abnormalities: Secondary | ICD-10-CM | POA: Diagnosis not present

## 2015-11-29 DIAGNOSIS — R310 Gross hematuria: Secondary | ICD-10-CM | POA: Diagnosis not present

## 2015-11-29 DIAGNOSIS — Z9889 Other specified postprocedural states: Secondary | ICD-10-CM | POA: Diagnosis not present

## 2015-11-29 DIAGNOSIS — I700 Atherosclerosis of aorta: Secondary | ICD-10-CM | POA: Diagnosis not present

## 2015-11-29 DIAGNOSIS — M899 Disorder of bone, unspecified: Secondary | ICD-10-CM | POA: Diagnosis not present

## 2015-11-29 DIAGNOSIS — C61 Malignant neoplasm of prostate: Secondary | ICD-10-CM | POA: Diagnosis not present

## 2015-11-29 DIAGNOSIS — N200 Calculus of kidney: Secondary | ICD-10-CM | POA: Diagnosis not present

## 2015-11-29 NOTE — Patient Outreach (Signed)
Telephone call to patient for transition of care week 2, patient's son Francee Piccolo answered phone and states pt is at hospital for CT scan due to increase in PSA levels and will get results next week, pt unable to talk at present and can call RN CM at his convenience.  [name] [facility], [place] Coordinator [phone]

## 2015-11-29 NOTE — Telephone Encounter (Signed)
   Diagnosis:    Result(s)   Card 1: Negative:  Collected 11/22/2015    Card 2: Negative: Collected 11/23/2015   Card 3: Positive:  Collected 11/26/2015    Completed by: Thomas Hoff ,LPN   HEMOCCULT SENSA DEVELOPER: IFB#:[id number] Marguarite Arbour DATE: 2020-05   HEMOCCULT SENSA CARD:  EXM#:[id number]   EXPIRATION DATE: 05/20   CARD CONTROL RESULTS:  POSITIVE: Positive  NEGATIVE: Negative    ADDITIONAL COMMENTS: Results forwarded to [place] for review.

## 2015-12-01 DIAGNOSIS — I482 Chronic atrial fibrillation: Secondary | ICD-10-CM | POA: Diagnosis not present

## 2015-12-01 DIAGNOSIS — Z8546 Personal history of malignant neoplasm of prostate: Secondary | ICD-10-CM | POA: Diagnosis not present

## 2015-12-01 DIAGNOSIS — I130 Hypertensive heart and chronic kidney disease with heart failure and stage 1 through stage 4 chronic kidney disease, or unspecified chronic kidney disease: Secondary | ICD-10-CM | POA: Diagnosis not present

## 2015-12-01 DIAGNOSIS — Z7901 Long term (current) use of anticoagulants: Secondary | ICD-10-CM | POA: Diagnosis not present

## 2015-12-01 DIAGNOSIS — N183 Chronic kidney disease, stage 3 (moderate): Secondary | ICD-10-CM | POA: Diagnosis not present

## 2015-12-01 DIAGNOSIS — Z48815 Encounter for surgical aftercare following surgery on the digestive system: Secondary | ICD-10-CM | POA: Diagnosis not present

## 2015-12-01 DIAGNOSIS — I509 Heart failure, unspecified: Secondary | ICD-10-CM | POA: Diagnosis not present

## 2015-12-01 DIAGNOSIS — E46 Unspecified protein-calorie malnutrition: Secondary | ICD-10-CM | POA: Diagnosis not present

## 2015-12-01 NOTE — Telephone Encounter (Signed)
err

## 2015-12-01 NOTE — Telephone Encounter (Signed)
Results left on answering machine. All are normal

## 2015-12-06 DIAGNOSIS — N184 Chronic kidney disease, stage 4 (severe): Secondary | ICD-10-CM | POA: Diagnosis not present

## 2015-12-06 DIAGNOSIS — I10 Essential (primary) hypertension: Secondary | ICD-10-CM | POA: Diagnosis not present

## 2015-12-06 DIAGNOSIS — D509 Iron deficiency anemia, unspecified: Secondary | ICD-10-CM | POA: Diagnosis not present

## 2015-12-06 DIAGNOSIS — R809 Proteinuria, unspecified: Secondary | ICD-10-CM | POA: Diagnosis not present

## 2015-12-06 DIAGNOSIS — E872 Acidosis: Secondary | ICD-10-CM | POA: Diagnosis not present

## 2015-12-06 DIAGNOSIS — I4891 Unspecified atrial fibrillation: Secondary | ICD-10-CM | POA: Diagnosis not present

## 2015-12-06 DIAGNOSIS — I509 Heart failure, unspecified: Secondary | ICD-10-CM | POA: Diagnosis not present

## 2015-12-07 DIAGNOSIS — I482 Chronic atrial fibrillation: Secondary | ICD-10-CM | POA: Diagnosis not present

## 2015-12-07 DIAGNOSIS — Z7901 Long term (current) use of anticoagulants: Secondary | ICD-10-CM | POA: Diagnosis not present

## 2015-12-07 DIAGNOSIS — E46 Unspecified protein-calorie malnutrition: Secondary | ICD-10-CM | POA: Diagnosis not present

## 2015-12-07 DIAGNOSIS — I509 Heart failure, unspecified: Secondary | ICD-10-CM | POA: Diagnosis not present

## 2015-12-07 DIAGNOSIS — I130 Hypertensive heart and chronic kidney disease with heart failure and stage 1 through stage 4 chronic kidney disease, or unspecified chronic kidney disease: Secondary | ICD-10-CM | POA: Diagnosis not present

## 2015-12-07 DIAGNOSIS — Z48815 Encounter for surgical aftercare following surgery on the digestive system: Secondary | ICD-10-CM | POA: Diagnosis not present

## 2015-12-07 DIAGNOSIS — Z8546 Personal history of malignant neoplasm of prostate: Secondary | ICD-10-CM | POA: Diagnosis not present

## 2015-12-07 DIAGNOSIS — N183 Chronic kidney disease, stage 3 (moderate): Secondary | ICD-10-CM | POA: Diagnosis not present

## 2015-12-08 ENCOUNTER — Other Ambulatory Visit: Payer: Self-pay | Admitting: *Deleted

## 2015-12-08 ENCOUNTER — Encounter: Payer: Self-pay | Admitting: *Deleted

## 2015-12-08 NOTE — Patient Outreach (Signed)
[place] [facility]) Care Management   12/08/2015  [name] [name] 12/09/1940 [id number]  [name] [name] is an 75 y.o. male  Subjective: Initial home visit with pt at son's home [name] [name] who is present, HIPAA verified, pt reports home health discharged him recently, pt states he weighs daily, checks blood pressure daily, reports his goal is "to get back in shape and get going again like I was"  Objective:   Vitals:   12/08/15 1312  BP: 122/78  Pulse: 65  Resp: 16  SpO2: 97%  Weight: 240 lb 12.8 oz (109.2 kg)  Height: 1.88 m (6\' 2" )   ROS  Physical Exam  Constitutional: He is oriented to person, place, and time. He appears well-developed and well-nourished.  HENT:  Head: Normocephalic.  Neck: Normal range of motion. Neck supple.  Cardiovascular: Normal rate and regular rhythm.   Respiratory: Effort normal and breath sounds normal.  Dyspnea with exertion  GI: Soft. Bowel sounds are normal.  Musculoskeletal: Normal range of motion. He exhibits no edema.  Neurological: He is alert and oriented to person, place, and time.  Skin: Skin is warm and dry.  Psychiatric: He has a normal mood and affect. His behavior is normal. Judgment and thought content normal.    Encounter Medications:   Outpatient Encounter Prescriptions as of 12/08/2015  Medication Sig  . acetaminophen (TYLENOL) 500 MG tablet Take 1 tablet (500 mg total) by mouth every 6 (six) hours as needed for mild pain or headache.  Marland Kitchen amiodarone (PACERONE) 200 MG tablet Take 1 tablet (200 mg total) by mouth twice daily for 2 weeks THEN DECREASE to 1 tablet ONCE daily.  . Calcium Carbonate-Vitamin D (CALCIUM 600+D) 600-400 MG-UNIT tablet Take 1 tablet by mouth 2 (two) times daily.  . ferrous sulfate (QC FERROUS SULFATE) 325 (65 FE) MG tablet Take 325 mg by mouth daily with breakfast.  . HYDROcodone-acetaminophen (NORCO/VICODIN) 5-325 MG tablet Take 1 tablet by mouth every 6 (six) hours as needed for moderate pain.   Marland Kitchen levothyroxine (SYNTHROID, LEVOTHROID) 50 MCG tablet Take 1 tablet (50 mcg total) by mouth every morning.  . metoprolol tartrate (LOPRESSOR) 25 MG tablet Take 1 tablet (25 mg total) by mouth 3 (three) times daily.  . Rivaroxaban (XARELTO) 15 MG TABS tablet Take 1 tablet (15 mg total) by mouth daily with supper.  . sodium bicarbonate 650 MG tablet Take 650 mg by mouth 2 (two) times daily.  . Vitamin D, Ergocalciferol, (DRISDOL) 50000 units CAPS capsule Take 50,000 Units by mouth every 7 (seven) days.  . clotrimazole (LOTRIMIN) 1 % cream Apply 1 application topically 2 (two) times daily.  . [DISCONTINUED] sulfamethoxazole-trimethoprim (BACTRIM DS,SEPTRA DS) 800-160 MG tablet Take 1 tablet by mouth 2 (two) times daily.   Facility-Administered Encounter Medications as of 12/08/2015  Medication  . neomycin-polymyxin-dexameth (MAXITROL) 0.1 % ophth ointment    Functional Status:   In your present state of health, do you have any difficulty performing the following activities: 12/08/2015 11/19/2015  Hearing? N N  Vision? N N  Difficulty concentrating or making decisions? N N  Walking or climbing stairs? Y Y  Dressing or bathing? N N  Doing errands, shopping? [name]  Preparing Food and eating ? N -  Using the Toilet? N -  In the past six months, have you accidently leaked urine? N -  Do you have problems with loss of bowel control? N -  Managing your Medications? Y -  Managing your Finances? N -  Housekeeping or managing your Housekeeping? Y -  Some recent data might be hidden    Fall/Depression Screening:    PHQ 2/9 Scores 12/08/2015 11/19/2015 07/02/2015 12/31/2014  PHQ - 2 Score 0 0 0 0   Fall Risk  12/08/2015 07/02/2015 12/31/2014  Falls in the past year? Yes No No  Number falls in past yr: 2 or more - -  Injury with Fall? No - -  Risk Factor Category  High Fall Risk - -  Risk for fall due to : Medication side effect - -   Assessment:  Pt plans to return to his own home January 10, 2016. Pt has excellent care from his son and daughter in law, son provides oversight for medications, assists pt with all appointments, prepares meals for pt.  Pt received cortisone injection in left hip recently and did receive some relief.  Pt may consider hip surgery in the near future.  RN CM focusing on endurance, safety, pt is using a cane, home health recently discharged pt.  RN CM faxed barrier letter and initial home visit to primary MD Dr. [name].   [facility] CM Care Plan Problem One   Flowsheet Row Most Recent Value  Care Plan Problem One  Pt high risk for hospital readmission related to disease process (bowel obstruction and resection, malnutrition)  Role Documenting the Problem One  Care Management Coordinator  Care Plan for Problem One  Active  THN Long Term Goal (31-90 days)  pt will have better understanding disease process to facilitate better outcomes and avoid readmission within 90 days.  Interventions for Problem One Long Term Goal  RN CM reviewed resources- [facility]. Reviewed all medications, gave pt and son prefilled med box for better organization.  THN CM Short Term Goal #1 (0-30 days)  pt will verbalize signs/ symptoms infection within 30 days.  THN CM Short Term Goal #1 Start Date  11/23/15  Interventions for Short Term Goal #1  RN cm reiterated signs and symptoms infection    THN CM Care Plan Problem Two   Flowsheet Row Most Recent Value  Care Plan Problem Two  Decreased endurance following surgery  Role Documenting the Problem Two  Care Management Coordinator  Care Plan for Problem Two  Active  THN CM Short Term Goal #1 (0-30 days)  Pt will verbalize increased endurance within 30 days.  THN CM Short Term Goal #1 Start Date  12/08/15  Interventions for Short Term Goal #2   RN CM reviewed energy conservation, importance of continuing to walk daily or continue using tread mill, encouraged pt to use light handweights that he has on hand.     Plan: follow up with  home visit in December Continue weekly transition of care calls  [name] [facility], [place] Coordinator [phone]

## 2015-12-09 ENCOUNTER — Telehealth (INDEPENDENT_AMBULATORY_CARE_PROVIDER_SITE_OTHER): Payer: Self-pay | Admitting: Internal Medicine

## 2015-12-09 NOTE — Telephone Encounter (Signed)
OV in January

## 2015-12-09 NOTE — Telephone Encounter (Signed)
Patient's son, Rexene Edison called, he wants to know since his dad's stool cards were negative, does he need to still follow up in January?  [phone]

## 2015-12-09 NOTE — Telephone Encounter (Signed)
Please let him keep his OV

## 2015-12-10 NOTE — Telephone Encounter (Signed)
I called the son [name] and advised him that his dad needs to keep his January appointment.  LMOAM

## 2015-12-14 ENCOUNTER — Other Ambulatory Visit: Payer: Self-pay | Admitting: *Deleted

## 2015-12-14 DIAGNOSIS — R3915 Urgency of urination: Secondary | ICD-10-CM | POA: Diagnosis not present

## 2015-12-14 DIAGNOSIS — C61 Malignant neoplasm of prostate: Secondary | ICD-10-CM | POA: Diagnosis not present

## 2015-12-14 DIAGNOSIS — N3001 Acute cystitis with hematuria: Secondary | ICD-10-CM | POA: Diagnosis not present

## 2015-12-14 DIAGNOSIS — R310 Gross hematuria: Secondary | ICD-10-CM | POA: Diagnosis not present

## 2015-12-14 DIAGNOSIS — N99111 Postprocedural bulbous urethral stricture: Secondary | ICD-10-CM | POA: Diagnosis not present

## 2015-12-14 DIAGNOSIS — N200 Calculus of kidney: Secondary | ICD-10-CM | POA: Diagnosis not present

## 2015-12-14 NOTE — Patient Outreach (Signed)
Telephone call to pt for transition of care week 4, spoke with patient's son [name], HIPAA verified,   [name] states pt just left urologist office and has bil kidney stones, will be having surgery (day surgery) and will be coming off blood thinner per cardiologist orders.  No other concerns reported, pt has all medications and taking as prescribed.  [facility] CM Care Plan Problem One   Flowsheet Row Most Recent Value  Care Plan Problem One  Pt high risk for hospital readmission related to disease process (bowel obstruction and resection, malnutrition)  Role Documenting the Problem One  Care Management Coordinator  Care Plan for Problem One  Active  THN Long Term Goal (31-90 days)  pt will have better understanding disease process to facilitate better outcomes and avoid readmission within 90 days.  THN Long Term Goal Start Date  11/23/15  Interventions for Problem One Long Term Goal  RN CM talked with son about urology visit today, pt has kidney stones and will be coming off blood thinner and working with cardiologist on this.  THN CM Short Term Goal #1 (0-30 days)  pt will verbalize signs/ symptoms infection within 30 days.  THN CM Short Term Goal #1 Start Date  11/23/15  Interventions for Short Term Goal #1  RN cm reinforced signs and symptoms infection    THN CM Care Plan Problem Two   Flowsheet Row Most Recent Value  Care Plan Problem Two  Decreased endurance following surgery  Role Documenting the Problem Two  Care Management Coordinator  Care Plan for Problem Two  Active  THN CM Short Term Goal #1 (0-30 days)  Pt will verbalize increased endurance within 30 days.  THN CM Short Term Goal #1 Start Date  12/08/15  Interventions for Short Term Goal #2   RN CM reviewed importance of being mobile each day, per son, pt is on the tread mill BID and trying to walk outside some      PLAN See pt for home visit in December  [name] [facility], [place] [phone]

## 2015-12-17 DIAGNOSIS — E559 Vitamin D deficiency, unspecified: Secondary | ICD-10-CM | POA: Diagnosis not present

## 2015-12-17 DIAGNOSIS — R809 Proteinuria, unspecified: Secondary | ICD-10-CM | POA: Diagnosis not present

## 2015-12-17 DIAGNOSIS — I10 Essential (primary) hypertension: Secondary | ICD-10-CM | POA: Diagnosis not present

## 2015-12-17 DIAGNOSIS — Z79899 Other long term (current) drug therapy: Secondary | ICD-10-CM | POA: Diagnosis not present

## 2015-12-17 DIAGNOSIS — N183 Chronic kidney disease, stage 3 (moderate): Secondary | ICD-10-CM | POA: Diagnosis not present

## 2015-12-17 DIAGNOSIS — D509 Iron deficiency anemia, unspecified: Secondary | ICD-10-CM | POA: Diagnosis not present

## 2015-12-20 ENCOUNTER — Ambulatory Visit (INDEPENDENT_AMBULATORY_CARE_PROVIDER_SITE_OTHER): Payer: PPO | Admitting: Cardiovascular Disease

## 2015-12-20 ENCOUNTER — Encounter: Payer: Self-pay | Admitting: Cardiovascular Disease

## 2015-12-20 VITALS — BP 120/76 | HR 95 | Ht 74.0 in | Wt 245.2 lb

## 2015-12-20 DIAGNOSIS — N200 Calculus of kidney: Secondary | ICD-10-CM | POA: Insufficient documentation

## 2015-12-20 DIAGNOSIS — N183 Chronic kidney disease, stage 3 unspecified: Secondary | ICD-10-CM

## 2015-12-20 DIAGNOSIS — Z7901 Long term (current) use of anticoagulants: Secondary | ICD-10-CM | POA: Diagnosis not present

## 2015-12-20 DIAGNOSIS — I10 Essential (primary) hypertension: Secondary | ICD-10-CM | POA: Diagnosis not present

## 2015-12-20 DIAGNOSIS — I481 Persistent atrial fibrillation: Secondary | ICD-10-CM | POA: Diagnosis not present

## 2015-12-20 DIAGNOSIS — Z79899 Other long term (current) drug therapy: Secondary | ICD-10-CM

## 2015-12-20 DIAGNOSIS — E039 Hypothyroidism, unspecified: Secondary | ICD-10-CM

## 2015-12-20 DIAGNOSIS — I4819 Other persistent atrial fibrillation: Secondary | ICD-10-CM

## 2015-12-20 NOTE — Patient Instructions (Addendum)
Dr Sallyanne Kuster recommends that you schedule a follow-up appointment in 3 months.  Your physician recommends that you return for lab work in 3 months just before your appointment with Dr C.  If you need a refill on your cardiac medications before your next appointment, please call your pharmacy.

## 2015-12-20 NOTE — Progress Notes (Signed)
Cardiology Office Note    Date:  12/20/2015   ID:  [name] HUBBERT, DOB 10/13/1940, MRN [id number]  PCP:  [name] Kilts, MD  Cardiologist:   [name] Klein, MD   Chief Complaint  Patient presents with  . Follow-up    PT C/O SOB    History of Present Illness:  [name] [name] is a 75 y.o. 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 Ventricular rate control has been poor recently and we added amiodarone. This began when he underwent partial small bowel resection, complicated by acute kidney injury in September. Rate control was a challenge due to low blood pressure. He returns in follow-up.  Fortunately, follow-up echo performed on August 21 does not show evidence of cardiomyopathy. EF is normal.  He is unaware of palpitations and has not had dizziness or syncope. He has not had any bleeding problems on chronic warfarin anticoagulation. He denies any focal neurological events and has never had a stroke or TIA. 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  He has 2 kidney stones, one on each side, that are felt to be unlikely to pass spontaneously. It sounds like his urologist, Dr. Exie [name] in [place] is planning cystoscopic stone retrieval.  A nuclear stress test in 2011 showed reported scar with mild peri-infarct ischemia in the apex and the inferior wall. It was felt to be a low risk scan since no significant ischemia was demonstrated. In 2013 left ventricular ejection fraction by echo was 50-55% without regional wall motion abnormalities identical with the follow-up echo in August 2017. Both atria were mildly dilated. No significant valvular abnormalities were reported. 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  Past Medical History:  Diagnosis Date  .  Cardiomyopathy [facility])    h/o felt 2nd atrial fib, EF normal by echo 2013  . Chronic anticoagulation   . Chronic atrial fibrillation ([place])   . Chronic kidney disease, stage 3   . Hypothyroidism   . Pneumonia   . Prostate cancer ([place])   . Shortness of breath   . Systemic hypertension     Past Surgical History:  Procedure Laterality Date  . CARDIOVERSION    . CATARACT EXTRACTION W/PHACO  04/17/2011   Procedure: CATARACT EXTRACTION PHACO AND INTRAOCULAR LENS PLACEMENT (IOC);  Surgeon: [name], MD;  Location: [facility];  Service: Ophthalmology;  Laterality: Left;  CDE:14.58  . CATARACT EXTRACTION W/PHACO  05/22/2011   Procedure: CATARACT EXTRACTION PHACO AND INTRAOCULAR LENS PLACEMENT (IOC);  Surgeon: [name], MD;  Location: [facility] OR[facility];  Service: Ophthalmology;  Laterality: Right;  CDE 13.02  . COLONOSCOPY  01/17/2011   Procedure: COLONOSCOPY;  Surgeon: [name] [name];  Location: [facility];  Service: Gastroenterology;  Laterality: N/A;  . INGUINAL HERNIA REPAIR Right 09/27/2015   Procedure: RIGHT INGUINAL HERNIORRHAPY WITH MESH SMALL BOWEL RESECTION;  Surgeon: [name] Signs, MD;  Location: [facility] ORS;  Service: General;  Laterality: Right;  . NM MYOCAR [place]  02/2009   dipyridamole; mild perfusion defect due to infarct/scar with mild perinfarct ischemia is apical septal, apical basal inferoseptal, basal inferior, mid inferoseptal mid inferior & apical inferior; EKG negative for ischemia; low risk scan  . PROSTATE SURGERY     7 years ago [facility] Dr [name] [name]-prostatectomy  . PROSTATECTOMY    . Sleep Study  05/2011  increased upper airway resistance syndrome with mild sleep apnea during REM  . TRANSTHORACIC ECHOCARDIOGRAM  04/2011   EF=50-55%; RV mildly dilated; LA mild-mod dilated; mild mitral valve annular calcif, mild MR; mod TR, RVSP elevated 30-67mmHg, mild pulm HTN; AV mildly sclerotic, mild calcif of AV leaflets; trace pulm valve regurg     Current Medications: Outpatient Medications  Prior to Visit  Medication Sig Dispense Refill  . acetaminophen (TYLENOL) 500 MG tablet Take 1 tablet (500 mg total) by mouth every 6 (six) hours as needed for mild pain or headache.    Marland Kitchen amiodarone (PACERONE) 200 MG tablet Take 1 tablet (200 mg total) by mouth twice daily for 2 weeks THEN DECREASE to 1 tablet ONCE daily. 104 tablet 3  . Calcium Carbonate-Vitamin D (CALCIUM 600+D) 600-400 MG-UNIT tablet Take 1 tablet by mouth 2 (two) times daily.    . clotrimazole (LOTRIMIN) 1 % cream Apply 1 application topically 2 (two) times daily.    . ferrous sulfate (QC FERROUS SULFATE) 325 (65 FE) MG tablet Take 325 mg by mouth daily with breakfast.    . HYDROcodone-acetaminophen (NORCO/VICODIN) 5-325 MG tablet Take 1 tablet by mouth every 6 (six) hours as needed for moderate pain.    Marland Kitchen levothyroxine (SYNTHROID, LEVOTHROID) 50 MCG tablet Take 1 tablet (50 mcg total) by mouth every morning. 30 tablet 6  . metoprolol tartrate (LOPRESSOR) 25 MG tablet Take 1 tablet (25 mg total) by mouth 3 (three) times daily. 180 tablet 3  . Rivaroxaban (XARELTO) 15 MG TABS tablet Take 1 tablet (15 mg total) by mouth daily with supper. 90 tablet 1  . sodium bicarbonate 650 MG tablet Take 650 mg by mouth 2 (two) times daily.    . Vitamin D, Ergocalciferol, (DRISDOL) 50000 units CAPS capsule Take 50,000 Units by mouth every 7 (seven) days.     Facility-Administered Medications Prior to Visit  Medication Dose Route Frequency Provider Last Rate Last Dose  . neomycin-polymyxin-dexameth (MAXITROL) 0.1 % ophth ointment    PRN [name], MD   1 application at 99/24/26 8341     Allergies:   Patient has no known allergies.   Social History   Social History  . Marital status: Single    Spouse name: N/A  . Number of children: 2  . Years of education: N/A   Social History Main Topics  . Smoking status: Never Smoker  . Smokeless tobacco: Never Used  . Alcohol use No  . Drug use: No  . Sexual activity: Yes    Birth control/  protection: None   Other Topics Concern  . None   Social History Narrative  . None     Family History:  The patient's  family history includes Diabetes in his mother; Prostate cancer in his father.   ROS:   Please see the history of present illness.    ROS All other systems reviewed and are negative.   PHYSICAL EXAM:   VS:  BP 120/76 (BP Location: Right Arm, Patient Position: Sitting, Cuff Size: Normal)   Pulse 95   Ht 6\' 2"  (1.88 m)   Wt 245 lb 3.2 oz (111.2 kg)   SpO2 99%   BMI 31.48 kg/m    GEN: Well nourished, well developed, in no acute distress  HEENT: normal  Neck: no JVD, carotid bruits, or masses Cardiac: Irregular, no murmurs, rubs, or gallops,no edema  Respiratory:  clear to auscultation bilaterally, normal work of breathing GI: soft, nontender, nondistended, + BS MS: no deformity  or atrophy  Skin: warm and dry, no rash Neuro:  Alert and Oriented x 3, Strength and sensation are intact Psych: euthymic mood, full affect  Wt Readings from Last 3 Encounters:  12/20/15 245 lb 3.2 oz (111.2 kg)  12/08/15 240 lb 12.8 oz (109.2 kg)  11/22/15 237 lb 14.4 oz (107.9 kg)      Studies/Labs Reviewed:   EKG:  EKG is ordered today.  The ekg ordered today demonstrates Atrial fibrillation and left anterior fascicular block  Recent Labs: 09/23/2015: TSH 1.673 10/06/2015: Magnesium 1.9 10/28/2015: ALT 13; BUN 32; Creatinine, Ser 1.87; Potassium 4.3; Sodium 136 11/22/2015: Hemoglobin 12.5; Platelets 358   Lipid Panel    Component Value Date/Time   CHOL  02/04/2009 0538    142        ATP III CLASSIFICATION:  <200     mg/dL   Desirable  200-239  mg/dL   Borderline High  >=240    mg/dL   High          TRIG 122 02/04/2009 0538   HDL 33 (L) 02/04/2009 0538   CHOLHDL 4.3 02/04/2009 0538   VLDL 24 02/04/2009 0538   LDLCALC  02/04/2009 0538    85        Total Cholesterol/HDL:CHD Risk Coronary Heart Disease Risk Table                     Men   Women  1/2 Average  Risk   3.4   3.3  Average Risk       5.0   4.4  2 X Average Risk   9.6   7.1  3 X Average Risk  23.4   11.0        Use the calculated Patient Ratio above and the CHD Risk Table to determine the patient's CHD Risk.        ATP III CLASSIFICATION (LDL):  <100     mg/dL   Optimal  100-129  mg/dL   Near or Above                    Optimal  130-159  mg/dL   Borderline  160-189  mg/dL   High  >190     mg/dL   Very High    ASSESSMENT:    1. On amiodarone therapy      PLAN:  In order of problems listed above:  1. AFib: Rate control appears substantially improved. Amiodarone provides excellent rate control and was added since there was no room to increase his beta blocker due to low blood pressure. It is likely that anemia and deconditioning played a role in these developments, following his abdominal surgery. Hopefully, we will be able to wean down the dose of amiodarone over the next several months. Talked to the patient and his son about the potential side effects of amiodarone. As long as he takes his medication need to check his thyroid function and liver function tests at least twice yearly. CHADSVasc 4 (age 50, HTN, HF). 2. HTN: Excellent control. 3. Xarelto:. No history of stroke/TIA and no bleeding complications 4. Amiodarone: Potential side effects discussed, need for routine testing. 5. Hypothyroidism: Normal TSH - appropriate dose of levothyroxine. 6. Nephrolithiasis: Plan for cystoscopic procedure. Should be safe for him to temporarily discontinue Xarelto for that procedure. Xarelto can be stopped 48 hours before the procedure and resumed as soon as safe afterwards. 7. CKD stage 3    Medication Adjustments/Labs and  Tests Ordered: Current medicines are reviewed at length with the patient today.  Concerns regarding medicines are outlined above.  Medication changes, Labs and Tests ordered today are listed in the Patient Instructions below. Patient Instructions  Dr Sallyanne Kuster  recommends that you schedule a follow-up appointment in 3 months.  Your physician recommends that you return for lab work in 3 months just before your appointment with Dr C.  If you need a refill on your cardiac medications before your next appointment, please call your pharmacy.    Signed, [name] Klein, MD  12/20/2015 2:33 PM    [address], [place], [place]  [address] Phone: [phone]; Fax: [phone]

## 2015-12-24 ENCOUNTER — Encounter: Payer: Self-pay | Admitting: Cardiovascular Disease

## 2015-12-24 ENCOUNTER — Ambulatory Visit (INDEPENDENT_AMBULATORY_CARE_PROVIDER_SITE_OTHER): Payer: PPO | Admitting: "Endocrinology

## 2015-12-24 ENCOUNTER — Encounter: Payer: Self-pay | Admitting: "Endocrinology

## 2015-12-24 ENCOUNTER — Telehealth: Payer: Self-pay

## 2015-12-24 VITALS — BP 128/83 | HR 88 | Ht 74.0 in | Wt 245.0 lb

## 2015-12-24 DIAGNOSIS — R7303 Prediabetes: Secondary | ICD-10-CM | POA: Diagnosis not present

## 2015-12-24 DIAGNOSIS — E559 Vitamin D deficiency, unspecified: Secondary | ICD-10-CM | POA: Diagnosis not present

## 2015-12-24 DIAGNOSIS — E039 Hypothyroidism, unspecified: Secondary | ICD-10-CM

## 2015-12-24 DIAGNOSIS — E212 Other hyperparathyroidism: Secondary | ICD-10-CM | POA: Diagnosis not present

## 2015-12-24 MED ORDER — VITAMIN D3 125 MCG (5000 UT) PO CAPS: 5000.0000 [IU] | ORAL_CAPSULE | Freq: Every day | ORAL | 0 refills | Status: DC

## 2015-12-24 NOTE — Telephone Encounter (Signed)
Request for surgical clearance:   1. What type surgery is being performed? Urethral dilation  2. When is this surgery scheduled? pending  3. Are there any medications that need to be held prior to surgery and how long? Xarelto 15 mg QD; Dr Exie Parody is requesting for patient to be off Xarelto 5-7 days prior to procedure  4. Name of the physician performing surgery: Dr Clyde Lundborg  5. What is the office phone and fax number?   Phone [phone]  Fax [phone]

## 2015-12-24 NOTE — Telephone Encounter (Signed)
Sent via epic 

## 2015-12-24 NOTE — Progress Notes (Signed)
Subjective:    Patient ID: [name] [name], male    DOB: 1940-09-08, PCP [name], MD   Past Medical History:  Diagnosis Date  . Cardiomyopathy [facility])    h/o felt 2nd atrial fib, EF normal by echo 2013  . Chronic anticoagulation   . Chronic atrial fibrillation ([place])   . Chronic kidney disease, stage 3   . Hypothyroidism   . Pneumonia   . Prostate cancer ([name])   . Shortness of breath   . Systemic hypertension    Past Surgical History:  Procedure Laterality Date  . CARDIOVERSION    . CATARACT EXTRACTION W/PHACO  04/17/2011   Procedure: CATARACT EXTRACTION PHACO AND INTRAOCULAR LENS PLACEMENT (IOC);  Surgeon: [name], MD;  Location: [facility];  Service: Ophthalmology;  Laterality: Left;  CDE:14.58  . CATARACT EXTRACTION W/PHACO  05/22/2011   Procedure: CATARACT EXTRACTION PHACO AND INTRAOCULAR LENS PLACEMENT (IOC);  Surgeon: [name], MD;  Location: [facility];  Service: Ophthalmology;  Laterality: Right;  CDE 13.02  . COLONOSCOPY  01/17/2011   Procedure: COLONOSCOPY;  Surgeon: Jamesetta So;  Location: [facility];  Service: Gastroenterology;  Laterality: N/A;  . INGUINAL HERNIA REPAIR Right 09/27/2015   Procedure: RIGHT INGUINAL HERNIORRHAPY WITH MESH SMALL BOWEL RESECTION;  Surgeon: Aviva Signs, MD;  Location: [facility];  Service: General;  Laterality: Right;  . NM MYOCAR [place]  02/2009   dipyridamole; mild perfusion defect due to infarct/scar with mild perinfarct ischemia is apical septal, apical basal inferoseptal, basal inferior, mid inferoseptal mid inferior & apical inferior; EKG negative for ischemia; low risk scan  . PROSTATE SURGERY     7 years ago [facility] Dr Javaid-prostatectomy  . PROSTATECTOMY    . Sleep Study  05/2011   increased upper airway resistance syndrome with mild sleep apnea during REM  . TRANSTHORACIC ECHOCARDIOGRAM  04/2011   EF=50-55%; RV mildly dilated; LA mild-mod dilated; mild mitral valve annular calcif, mild MR; mod TR, RVSP elevated  30-22mHg, mild pulm HTN; AV mildly sclerotic, mild calcif of AV leaflets; trace pulm valve regurg    Social History   Social History  . Marital status: Single    Spouse name: N/A  . Number of children: 2  . Years of education: N/A   Social History Main Topics  . Smoking status: Never Smoker  . Smokeless tobacco: Never Used  . Alcohol use No  . Drug use: No  . Sexual activity: Yes    Birth control/ protection: None   Other Topics Concern  . None   Social History Narrative  . None   Outpatient Encounter Prescriptions as of 12/24/2015  Medication Sig  . acetaminophen (TYLENOL) 500 MG tablet Take 1 tablet (500 mg total) by mouth every 6 (six) hours as needed for mild pain or headache.  .Marland Kitchenamiodarone (PACERONE) 200 MG tablet Take 1 tablet (200 mg total) by mouth twice daily for 2 weeks THEN DECREASE to 1 tablet ONCE daily.  . Calcium Carbonate-Vitamin D (CALCIUM 600+D) 600-400 MG-UNIT tablet Take 1 tablet by mouth 2 (two) times daily.  . Cholecalciferol (VITAMIN D3) 5000 units CAPS Take 1 capsule (5,000 Units total) by mouth daily.  . clotrimazole (LOTRIMIN) 1 % cream Apply 1 application topically 2 (two) times daily.  . ferrous sulfate (QC FERROUS SULFATE) 325 (65 FE) MG tablet Take 325 mg by mouth daily with breakfast.  . HYDROcodone-acetaminophen (NORCO/VICODIN) 5-325 MG tablet Take 1 tablet by mouth every 6 (six) hours as  needed for moderate pain.  Marland Kitchen levothyroxine (SYNTHROID, LEVOTHROID) 50 MCG tablet Take 1 tablet (50 mcg total) by mouth every morning.  . metoprolol tartrate (LOPRESSOR) 25 MG tablet Take 1 tablet (25 mg total) by mouth 3 (three) times daily.  . Rivaroxaban (XARELTO) 15 MG TABS tablet Take 1 tablet (15 mg total) by mouth daily with supper.  . sodium bicarbonate 650 MG tablet Take 650 mg by mouth 2 (two) times daily.  Marland Kitchen sulfamethoxazole-trimethoprim (BACTRIM DS,SEPTRA DS) 800-160 MG tablet Take 1 tablet by mouth daily.  . [DISCONTINUED] Vitamin D, Ergocalciferol,  (DRISDOL) 50000 units CAPS capsule Take 50,000 Units by mouth every 7 (seven) days.   Facility-Administered Encounter Medications as of 12/24/2015  Medication  . neomycin-polymyxin-dexameth (MAXITROL) 0.1 % ophth ointment   ALLERGIES: No Known Allergies VACCINATION STATUS:  There is no immunization history on file for this patient.  HPI  75 yr old male with above medical hx. he is here to f/u for hypothyroidism , hypocalcemia, vitamin D deficiency.  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 he is compliant. He has no new complaints.  he denies cold/heat intolerance. he give hx of progressive weight loss of 20 lbs in 2 years from "eating healthy", but has now steady weight.  he denies hx of goiter , nor family hx of thyroid cancer. he survives prostate cancer. -he has history of tertiary hyperparathyroidism with PTH as high as 63 which is currently improving to 24 with supplement of calcium and vitamin D.   his 24 hour urine calcium has been  low at 57.   Review of Systems  Constitutional: + weight loss related to his recent small bowel obstruction/surgical, no fatigue, no subjective hyperthermia/hypothermia Eyes: no blurry vision, no xerophthalmia ENT: no sore throat, no nodules palpated in throat, no dysphagia/odynophagia, no hoarseness Cardiovascular: no CP/SOB/palpitations/leg swelling Respiratory: no cough/SOB Gastrointestinal: no N/V/D/C Musculoskeletal: no muscle/joint aches Skin: no rashes Neurological: no tremors/numbness/tingling/dizziness Psychiatric: no depression/anxiety  Objective:    BP 128/83   Pulse 88   Ht 6' 2"  (1.88 m)   Wt 245 lb (111.1 kg)   BMI 31.46 kg/m   Wt Readings from Last 3 Encounters:  12/24/15 245 lb (111.1 kg)  12/20/15 245 lb 3.2 oz (111.2 kg)  12/08/15 240 lb 12.8 oz (109.2 kg)    Physical Exam   Constitutional: overweight, in NAD Eyes: PERRLA, EOMI, no  exophthalmos ENT: moist mucous membranes, no thyromegaly, no cervical lymphadenopathy, poor dental condition. Cardiovascular: RRR, No MRG Respiratory: CTA B Gastrointestinal: abdomen soft, NT, ND, BS+ Musculoskeletal: no deformities, strength intact in all 4 Skin: moist, warm, no rashes Neurological: no tremor with outstretched hands, DTR normal in all 4  Results for orders placed or performed in visit on 11/22/15  CBC with Differential/Platelet  Result Value Ref Range   WBC 9.2 3.8 - 10.8 K/uL   RBC 3.97 (L) 4.20 - 5.80 MIL/uL   Hemoglobin 12.5 (L) 13.2 - 17.1 g/dL   HCT 38.4 (L) 38.5 - 50.0 %   MCV 96.7 80.0 - 100.0 fL   MCH 31.5 27.0 - 33.0 pg   MCHC 32.6 32.0 - 36.0 g/dL   RDW 15.7 (H) 11.0 - 15.0 %   Platelets 358 140 - 400 K/uL   MPV 8.9 7.5 - 12.5 fL   Neutro Abs 5,244 1,500 - 7,800 cells/uL   Lymphs Abs 2,944 850 - 3,900 cells/uL  Monocytes Absolute 828 200 - 950 cells/uL   Eosinophils Absolute 184 15 - 500 cells/uL   Basophils Absolute 0 0 - 200 cells/uL   Neutrophils Relative % 57 %   Lymphocytes Relative 32 %   Monocytes Relative 9 %   Eosinophils Relative 2 %   Basophils Relative 0 %   Smear Review Criteria for review not met   Ferritin  Result Value Ref Range   Ferritin 327 20 - 380 ng/mL  Iron Binding Cap (TIBC)  Result Value Ref Range   Iron 118 50 - 180 ug/dL   UIBC 179 125 - 400 ug/dL   TIBC 297 250 - 425 ug/dL   %SAT 40 15 - 60 %    Lipid Panel     Component Value Date/Time   CHOL  02/04/2009 0538    142        ATP III CLASSIFICATION:  <200     mg/dL   Desirable  200-239  mg/dL   Borderline High  >=240    mg/dL   High          TRIG 122 02/04/2009 0538   HDL 33 (L) 02/04/2009 0538   CHOLHDL 4.3 02/04/2009 0538   VLDL 24 02/04/2009 0538   LDLCALC  02/04/2009 0538    85        Total Cholesterol/HDL:CHD Risk Coronary Heart Disease Risk Table                     Men   Women  1/2 Average Risk   3.4   3.3  Average Risk       5.0   4.4  2 X  Average Risk   9.6   7.1  3 X Average Risk  23.4   11.0        Use the calculated Patient Ratio above and the CHD Risk Table to determine the patient's CHD Risk.        ATP III CLASSIFICATION (LDL):  <100     mg/dL   Optimal  100-129  mg/dL   Near or Above                    Optimal  130-159  mg/dL   Borderline  160-189  mg/dL   High  >190     mg/dL   Very High      Assessment & Plan:   1.  hypothyroidism  His recent TFTs are appropriate. He will be continued on  Levothyroxine 50 mcg po qam.    - We discussed about correct intake of levothyroxine, at fasting, with water, separated by at least 30 minutes from breakfast, and separated by more than 4 hours from calcium, iron, multivitamins, acid reflux medications (PPIs). -Patient is made aware of the fact that thyroid hormone replacement is needed for life, dose to be adjusted by periodic monitoring of thyroid function tests.   2. Tertiary hyperparathyroidism  With Hypocalcemia  His last PTH is better at 24 from 89 ( could be CKD effect) along with calcium of low normal at 9.2. His calcium level is improving. -I advised him to continue daily supplement of calcium along with vitamin D. His 24 hour urine calcium has been  low at 57  3. Vitamin D deficiency: - He is now vitamin D replete at 21. He just needs maintenance therapy.   - changes vitamin D supplement to vitamin D3 5000 units daily for the next 90 days.  4. Hypertension: Controlled. I advised him to continue losartan and carvedilol.  5.  Obesity with prediabetes At risk for diabetes.  He lost approximately 15 pounds related to his small bowel obstruction and surgery. He will not need treatment at this time. I gave him dietary advice to avoid simple carbs. Will check a1c next visit.  - I advised patient to maintain close follow up with [name], MD for primary care needs. Follow up plan: Return in about 6 months (around 06/23/2016) for follow up with  pre-visit labs.  Glade Lloyd, MD Phone: [phone]  Fax: [phone]   12/24/2015, 8:38 AM

## 2015-12-28 ENCOUNTER — Other Ambulatory Visit: Payer: Self-pay | Admitting: *Deleted

## 2015-12-28 ENCOUNTER — Encounter: Payer: Self-pay | Admitting: *Deleted

## 2015-12-28 NOTE — Patient Outreach (Signed)
[facility]) Care Management   12/28/2015  [name] [name] [birth year] [id number]  [name] [name] is an 75 y.o. male  Subjective:  Routine home visit with pt, HIPAA verified, pt reports he is doing well and still staying with his son, now using treadmill 3 times daily.  Will be scheduled for day surgery for kidney stones, verbalizes he will go off xarelto for this procedure.  Pt states he "thinks I will get a consultation about my left hip in the spring and maybe have surgery", medication change made- add vitamin D.  Objective:   Vitals:   12/28/15 1423  BP: 120/64  Pulse: 74  Resp: 16  SpO2: 97%   ROS  Physical Exam  Constitutional: He is oriented to person, place, and time. He appears well-developed and well-nourished.  Neck: Normal range of motion. Neck supple.  Cardiovascular: Normal rate and regular rhythm.   Respiratory: Effort normal and breath sounds normal.  GI: Soft. Bowel sounds are normal.  Musculoskeletal: Normal range of motion. He exhibits no edema.  Neurological: He is alert and oriented to person, place, and time.  Skin: Skin is warm and dry.  Psychiatric: He has a normal mood and affect. His behavior is normal. Judgment and thought content normal.    Encounter Medications:   Outpatient Encounter Prescriptions as of 12/28/2015  Medication Sig Note  . acetaminophen (TYLENOL) 500 MG tablet Take 1 tablet (500 mg total) by mouth every 6 (six) hours as needed for mild pain or headache.   Marland Kitchen amiodarone (PACERONE) 200 MG tablet Take 1 tablet (200 mg total) by mouth twice daily for 2 weeks THEN DECREASE to 1 tablet ONCE daily.   . Calcium Carbonate-Vitamin D (CALCIUM 600+D) 600-400 MG-UNIT tablet Take 1 tablet by mouth 2 (two) times daily.   . Cholecalciferol (VITAMIN D3) 5000 units CAPS Take 1 capsule (5,000 Units total) by mouth daily.   . clotrimazole (LOTRIMIN) 1 % cream Apply 1 application topically 2 (two) times daily.   . ferrous sulfate  (QC FERROUS SULFATE) 325 (65 FE) MG tablet Take 325 mg by mouth daily with breakfast.   . HYDROcodone-acetaminophen (NORCO/VICODIN) 5-325 MG tablet Take 1 tablet by mouth every 6 (six) hours as needed for moderate pain.   Marland Kitchen levothyroxine (SYNTHROID, LEVOTHROID) 50 MCG tablet Take 1 tablet (50 mcg total) by mouth every morning.   . metoprolol tartrate (LOPRESSOR) 25 MG tablet Take 1 tablet (25 mg total) by mouth 3 (three) times daily.   . Rivaroxaban (XARELTO) 15 MG TABS tablet Take 1 tablet (15 mg total) by mouth daily with supper.   . sodium bicarbonate 650 MG tablet Take 650 mg by mouth 2 (two) times daily.   Marland Kitchen sulfamethoxazole-trimethoprim (BACTRIM DS,SEPTRA DS) 800-160 MG tablet Take 1 tablet by mouth daily. 12/20/2015: Received from: External Pharmacy   Facility-Administered Encounter Medications as of 12/28/2015  Medication  . neomycin-polymyxin-dexameth (MAXITROL) 0.1 % ophth ointment    Functional Status:   In your present state of health, do you have any difficulty performing the following activities: 12/08/2015 11/19/2015  Hearing? N N  Vision? N N  Difficulty concentrating or making decisions? N N  Walking or climbing stairs? Y Y  Dressing or bathing? N N  Doing errands, shopping? [name] [name]  Preparing Food and eating ? N -  Using the Toilet? N -  In the past six months, have you accidently leaked urine? N -  Do you have problems with loss of bowel control? N -  Managing your Medications? Y -  Managing your Finances? N -  Housekeeping or managing your Housekeeping? Y -  Some recent data might be hidden    Fall/Depression Screening:    PHQ 2/9 Scores 12/24/2015 12/08/2015 11/19/2015 07/02/2015 12/31/2014  PHQ - 2 Score 0 0 0 0 0    Assessment:  Pt has very good care provided by his sons, pt is living with his son [name] [name] at present, pt has all medications and taking as prescribed, is trying to be as active as possible, is attending all appointments, no further needs for [facility] care  management, discharge today, Rn CM mailed letter to pt home and faxed case closure letter to primary MD Dr. [name].  [facility] CM Care Plan Problem One   Flowsheet Row Most Recent Value  Care Plan Problem One  Pt high risk for hospital readmission related to disease process (bowel obstruction and resection, malnutrition)  Role Documenting the Problem One  Care Management Coordinator  Care Plan for Problem One  Active  [facility] Long Term Goal (31-90 days)  pt will have better understanding disease process to facilitate better outcomes and avoid readmission within 90 days.  [facility] Long Term Goal Start Date  11/23/15  [facility] Long Term Goal Met Date  12/28/15  Interventions for Problem One Long Term Goal  RN CM discussed with pt and his son about all upcoming appointments, procedures., medications.  [facility] CM Short Term Goal #1 (0-30 days)  pt will verbalize signs/ symptoms infection within 30 days.  [facility] CM Short Term Goal #1 Start Date  11/23/15  [facility] CM Short Term Goal #1 Met Date  12/28/15  Interventions for Short Term Goal #1  RN cm reiterated signs and symptoms infection    [facility] CM Care Plan Problem Two   Flowsheet Row Most Recent Value  Care Plan Problem Two  Decreased endurance following surgery  Role Documenting the Problem Two  Care Management Coordinator  Care Plan for Problem Two  Active  [facility] CM Short Term Goal #1 (0-30 days)  Pt will verbalize increased endurance within 30 days.  [facility] CM Short Term Goal #1 Start Date  12/08/15  [facility] CM Short Term Goal #1 Met Date   12/28/15  Interventions for Short Term Goal #2   RN CM reiterated importance of being mobile each day, per son, pt is on the tread mill TID and trying to walk outside some      Plan: discharge today  [name] [facility], [place] Coordinator [phone]

## 2015-12-30 NOTE — Telephone Encounter (Signed)
Faxed clearance letter to [facility].

## 2016-01-11 DIAGNOSIS — N3001 Acute cystitis with hematuria: Secondary | ICD-10-CM | POA: Diagnosis not present

## 2016-01-11 DIAGNOSIS — G894 Chronic pain syndrome: Secondary | ICD-10-CM | POA: Diagnosis not present

## 2016-01-11 DIAGNOSIS — E6609 Other obesity due to excess calories: Secondary | ICD-10-CM | POA: Diagnosis not present

## 2016-01-11 DIAGNOSIS — Z6833 Body mass index (BMI) 33.0-33.9, adult: Secondary | ICD-10-CM | POA: Diagnosis not present

## 2016-01-11 DIAGNOSIS — Z1389 Encounter for screening for other disorder: Secondary | ICD-10-CM | POA: Diagnosis not present

## 2016-01-17 ENCOUNTER — Encounter (INDEPENDENT_AMBULATORY_CARE_PROVIDER_SITE_OTHER): Payer: Self-pay | Admitting: *Deleted

## 2016-01-17 ENCOUNTER — Telehealth (INDEPENDENT_AMBULATORY_CARE_PROVIDER_SITE_OTHER): Payer: Self-pay | Admitting: *Deleted

## 2016-01-17 ENCOUNTER — Ambulatory Visit (INDEPENDENT_AMBULATORY_CARE_PROVIDER_SITE_OTHER): Payer: PPO | Admitting: Internal Medicine

## 2016-01-17 ENCOUNTER — Encounter (INDEPENDENT_AMBULATORY_CARE_PROVIDER_SITE_OTHER): Payer: Self-pay | Admitting: Internal Medicine

## 2016-01-17 ENCOUNTER — Other Ambulatory Visit (INDEPENDENT_AMBULATORY_CARE_PROVIDER_SITE_OTHER): Payer: Self-pay | Admitting: Internal Medicine

## 2016-01-17 VITALS — BP 164/90 | HR 60 | Temp 98.0°F | Ht 74.5 in | Wt 251.0 lb

## 2016-01-17 DIAGNOSIS — R195 Other fecal abnormalities: Secondary | ICD-10-CM | POA: Insufficient documentation

## 2016-01-17 MED ORDER — PEG 3350-KCL-NA BICARB-NACL 420 G PO SOLR: 4000.0000 mL | Freq: Once | ORAL | 0 refills | Status: AC

## 2016-01-17 NOTE — Progress Notes (Signed)
Subjective:    Patient ID: [name] [name], male    DOB: August 13, 1940, 76 y.o.   MRN: [id number]  HPI Here today for f/u. Last seen in November for blood in stool. He was guaiac negative by me in office. Hernia surgery in September by Dr. Arnoldo Morale.(incarerated rt internal hernia with resultant small bowel obstruction).  1/3 stool cards was positive that I sent home with patient in November. His appetite is good. He has gained about 14 pounds. He states he has not seen any blood. BM x 1 a day.  No family hx of colon cancer.  Hx of atrial fib and maintained on Xarelto. Hx of kidney stones and is having lithotripsy this Thursday.   12/17/2015 H and H 12.7 and 39.4, Iron TIBC 241, UIBC 166, Iron 75, Iron saturation 31. Iron/TIBC/Ferritin/ %Sat, ferritin 299    His last colonoscopy was in 2013 (screening). (Dr. Arnoldo Morale) With diverticula scattered in descending colon. Otherwise normal.     09/22/2015 CT abdomen/pelvis with CM:  N,V IMPRESSION: High-grade small bowel obstruction due to incarcerated right inguinal hernia containing small bowel loops.  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  Bilateral nephrolithiasis. Small kidneys with cortical thinning and scarring. No hydronephrosis.     Component Value Date/Time   IRON 118 11/22/2015 1016   TIBC 297 11/22/2015 1016   FERRITIN 327 11/22/2015 1016   IRONPCTSAT 40 11/22/2015 1016    CBC    Component Value Date/Time   WBC 9.2 11/22/2015 0944   RBC 3.97 (L) 11/22/2015 0944   HGB 12.5 (L) 11/22/2015 0944   HCT 38.4 (L) 11/22/2015 0944   PLT 358 11/22/2015 0944   MCV 96.7 11/22/2015 0944   MCH 31.5 11/22/2015 0944   MCHC 32.6 11/22/2015 0944   RDW 15.7 (H) 11/22/2015 0944   LYMPHSABS 2,944 11/22/2015 0944   MONOABS 828 11/22/2015 0944   EOSABS 184 11/22/2015 0944   BASOSABS 0 11/22/2015 0944    Review of Systems Past Medical History:    Diagnosis Date  . Cardiomyopathy [facility])    h/o felt 2nd atrial fib, EF normal by echo 2013  . Chronic anticoagulation   . Chronic atrial fibrillation ([place])   . Chronic kidney disease, stage 3   . Hypothyroidism   . Pneumonia   . Prostate cancer ([place])   . Shortness of breath   . Systemic hypertension     Past Surgical History:  Procedure Laterality Date  . CARDIOVERSION    . CATARACT EXTRACTION W/PHACO  04/17/2011   Procedure: CATARACT EXTRACTION PHACO AND INTRAOCULAR LENS PLACEMENT (IOC);  Surgeon: [name], MD;  Location: [facility];  Service: Ophthalmology;  Laterality: Left;  CDE:14.58  . CATARACT EXTRACTION W/PHACO  05/22/2011   Procedure: CATARACT EXTRACTION PHACO AND INTRAOCULAR LENS PLACEMENT (IOC);  Surgeon: [name], MD;  Location: [facility];  Service: Ophthalmology;  Laterality: Right;  CDE 13.02  . COLONOSCOPY  01/17/2011   Procedure: COLONOSCOPY;  Surgeon: Jamesetta So;  Location: [facility];  Service: Gastroenterology;  Laterality: N/A;  . INGUINAL HERNIA REPAIR Right 09/27/2015   Procedure: RIGHT INGUINAL HERNIORRHAPY WITH MESH SMALL BOWEL RESECTION;  Surgeon: Aviva Signs, MD;  Location: [facility];  Service: General;  Laterality: Right;  . NM MYOCAR [place]  02/2009   dipyridamole; mild perfusion defect due to infarct/scar with mild perinfarct ischemia is apical septal, apical basal inferoseptal, basal inferior, mid inferoseptal mid inferior & apical inferior; EKG negative  for ischemia; low risk scan  . PROSTATE SURGERY     7 years ago [facility] Dr Javaid-prostatectomy  . PROSTATECTOMY    . Sleep Study  05/2011   increased upper airway resistance syndrome with mild sleep apnea during REM  . TRANSTHORACIC ECHOCARDIOGRAM  04/2011   EF=50-55%; RV mildly dilated; LA mild-mod dilated; mild mitral valve annular calcif, mild MR; mod TR, RVSP elevated 30-41mmHg, mild pulm HTN; AV mildly sclerotic, mild calcif of AV leaflets; trace pulm valve regurg     No Known  Allergies  Current Outpatient Prescriptions on File Prior to Visit  Medication Sig Dispense Refill  . acetaminophen (TYLENOL) 500 MG tablet Take 1 tablet (500 mg total) by mouth every 6 (six) hours as needed for mild pain or headache.    Marland Kitchen amiodarone (PACERONE) 200 MG tablet Take 1 tablet (200 mg total) by mouth twice daily for 2 weeks THEN DECREASE to 1 tablet ONCE daily. 104 tablet 3  . Calcium Carbonate-Vitamin D (CALCIUM 600+D) 600-400 MG-UNIT tablet Take 1 tablet by mouth 2 (two) times daily.    . Cholecalciferol (VITAMIN D3) 5000 units CAPS Take 1 capsule (5,000 Units total) by mouth daily. 90 capsule 0  . clotrimazole (LOTRIMIN) 1 % cream Apply 1 application topically 2 (two) times daily.    . ferrous sulfate (QC FERROUS SULFATE) 325 (65 FE) MG tablet Take 325 mg by mouth daily with breakfast.    . HYDROcodone-acetaminophen (NORCO/VICODIN) 5-325 MG tablet Take 1 tablet by mouth every 6 (six) hours as needed for moderate pain.    Marland Kitchen levothyroxine (SYNTHROID, LEVOTHROID) 50 MCG tablet Take 1 tablet (50 mcg total) by mouth every morning. 30 tablet 6  . Rivaroxaban (XARELTO) 15 MG TABS tablet Take 1 tablet (15 mg total) by mouth daily with supper. 90 tablet 1  . sodium bicarbonate 650 MG tablet Take 650 mg by mouth 2 (two) times daily.    . metoprolol tartrate (LOPRESSOR) 25 MG tablet Take 1 tablet (25 mg total) by mouth 3 (three) times daily. 180 tablet 3   Current Facility-Administered Medications on File Prior to Visit  Medication Dose Route Frequency Provider Last Rate Last Dose  . neomycin-polymyxin-dexameth (MAXITROL) 0.1 % ophth ointment    PRN [name], MD   1 application at 76/19/50 9326       Objective:   Physical Exam Blood pressure (!) 164/90, pulse 60, temperature 98 F (36.7 C), height 6' 2.5" (1.892 m), weight 251 lb (113.9 kg). Alert and oriented. Skin warm and dry. Oral mucosa is moist.   . Sclera anicteric, conjunctivae is pink. Thyroid not enlarged. No cervical  lymphadenopathy. Lungs clear. Heart regular rate and rhythm.  Abdomen is soft. Bowel sounds are positive. No hepatomegaly. No abdominal masses felt. No tenderness. 1-2+ edema to lower extremities.         Assessment & Plan:  Guaiac positive stool. Hemoglobin was normal. Discussed with Dr. Laural Golden. Colon cancer needThe risks and benefits such as perforation, bleeding, and infection were reviewed with the patient and is agreeable.s to be ruled out. Colonoscopy.

## 2016-01-17 NOTE — Patient Instructions (Signed)
Colonoscopy.  The risks and benefits such as perforation, bleeding, and infection were reviewed with the patient and is agreeable. 

## 2016-01-17 NOTE — Telephone Encounter (Signed)
Patient needs trilyte 

## 2016-01-20 DIAGNOSIS — I4891 Unspecified atrial fibrillation: Secondary | ICD-10-CM | POA: Diagnosis not present

## 2016-01-20 DIAGNOSIS — Z9049 Acquired absence of other specified parts of digestive tract: Secondary | ICD-10-CM | POA: Diagnosis not present

## 2016-01-20 DIAGNOSIS — N99111 Postprocedural bulbous urethral stricture: Secondary | ICD-10-CM | POA: Diagnosis not present

## 2016-01-20 DIAGNOSIS — Z79899 Other long term (current) drug therapy: Secondary | ICD-10-CM | POA: Diagnosis not present

## 2016-01-20 DIAGNOSIS — E039 Hypothyroidism, unspecified: Secondary | ICD-10-CM | POA: Diagnosis not present

## 2016-01-20 DIAGNOSIS — Z833 Family history of diabetes mellitus: Secondary | ICD-10-CM | POA: Diagnosis not present

## 2016-01-20 DIAGNOSIS — I509 Heart failure, unspecified: Secondary | ICD-10-CM | POA: Diagnosis not present

## 2016-01-20 DIAGNOSIS — I110 Hypertensive heart disease with heart failure: Secondary | ICD-10-CM | POA: Diagnosis not present

## 2016-01-20 DIAGNOSIS — E119 Type 2 diabetes mellitus without complications: Secondary | ICD-10-CM | POA: Diagnosis not present

## 2016-01-20 DIAGNOSIS — Z87442 Personal history of urinary calculi: Secondary | ICD-10-CM | POA: Diagnosis not present

## 2016-01-20 DIAGNOSIS — Z7901 Long term (current) use of anticoagulants: Secondary | ICD-10-CM | POA: Diagnosis not present

## 2016-01-20 DIAGNOSIS — N359 Urethral stricture, unspecified: Secondary | ICD-10-CM | POA: Diagnosis not present

## 2016-01-20 DIAGNOSIS — Z8042 Family history of malignant neoplasm of prostate: Secondary | ICD-10-CM | POA: Diagnosis not present

## 2016-01-20 DIAGNOSIS — N3001 Acute cystitis with hematuria: Secondary | ICD-10-CM | POA: Diagnosis not present

## 2016-01-20 DIAGNOSIS — Z8546 Personal history of malignant neoplasm of prostate: Secondary | ICD-10-CM | POA: Diagnosis not present

## 2016-01-25 DIAGNOSIS — Z79899 Other long term (current) drug therapy: Secondary | ICD-10-CM | POA: Diagnosis not present

## 2016-01-25 DIAGNOSIS — C61 Malignant neoplasm of prostate: Secondary | ICD-10-CM | POA: Diagnosis not present

## 2016-01-25 DIAGNOSIS — N359 Urethral stricture, unspecified: Secondary | ICD-10-CM | POA: Diagnosis not present

## 2016-01-25 DIAGNOSIS — Z923 Personal history of irradiation: Secondary | ICD-10-CM | POA: Diagnosis not present

## 2016-01-25 DIAGNOSIS — Z8042 Family history of malignant neoplasm of prostate: Secondary | ICD-10-CM | POA: Diagnosis not present

## 2016-01-25 DIAGNOSIS — Z9079 Acquired absence of other genital organ(s): Secondary | ICD-10-CM | POA: Diagnosis not present

## 2016-01-25 DIAGNOSIS — N99111 Postprocedural bulbous urethral stricture: Secondary | ICD-10-CM | POA: Diagnosis not present

## 2016-01-25 DIAGNOSIS — Z7901 Long term (current) use of anticoagulants: Secondary | ICD-10-CM | POA: Diagnosis not present

## 2016-01-25 DIAGNOSIS — I10 Essential (primary) hypertension: Secondary | ICD-10-CM | POA: Diagnosis not present

## 2016-02-02 DIAGNOSIS — G894 Chronic pain syndrome: Secondary | ICD-10-CM | POA: Diagnosis not present

## 2016-02-02 DIAGNOSIS — Z6834 Body mass index (BMI) 34.0-34.9, adult: Secondary | ICD-10-CM | POA: Diagnosis not present

## 2016-02-02 DIAGNOSIS — E6609 Other obesity due to excess calories: Secondary | ICD-10-CM | POA: Diagnosis not present

## 2016-02-02 DIAGNOSIS — M1991 Primary osteoarthritis, unspecified site: Secondary | ICD-10-CM | POA: Diagnosis not present

## 2016-02-02 DIAGNOSIS — Z1389 Encounter for screening for other disorder: Secondary | ICD-10-CM | POA: Diagnosis not present

## 2016-02-02 DIAGNOSIS — M1612 Unilateral primary osteoarthritis, left hip: Secondary | ICD-10-CM | POA: Diagnosis not present

## 2016-02-11 ENCOUNTER — Encounter: Payer: Self-pay | Admitting: Cardiovascular Disease

## 2016-02-11 ENCOUNTER — Telehealth: Payer: Self-pay

## 2016-02-11 NOTE — Telephone Encounter (Signed)
Request for surgical clearance:   1. What type surgery is being performed? Left total hip  2. When is this surgery scheduled? pending  3. Are there any medications that need to be held prior to surgery and how long? N/A; pt takes Xarelto 15 mg QD  4. Name of the physician performing surgery: Dr Marchia Bond  5. What is the office phone and fax number?   Phone [phone]  Fax [phone]

## 2016-02-11 NOTE — Telephone Encounter (Signed)
Sent via epic 

## 2016-02-17 DIAGNOSIS — C61 Malignant neoplasm of prostate: Secondary | ICD-10-CM | POA: Diagnosis not present

## 2016-02-21 DIAGNOSIS — Z1389 Encounter for screening for other disorder: Secondary | ICD-10-CM | POA: Diagnosis not present

## 2016-02-21 DIAGNOSIS — Z6834 Body mass index (BMI) 34.0-34.9, adult: Secondary | ICD-10-CM | POA: Diagnosis not present

## 2016-02-21 DIAGNOSIS — E6609 Other obesity due to excess calories: Secondary | ICD-10-CM | POA: Diagnosis not present

## 2016-02-21 DIAGNOSIS — G894 Chronic pain syndrome: Secondary | ICD-10-CM | POA: Diagnosis not present

## 2016-02-23 NOTE — Telephone Encounter (Signed)
Faxed clearance letter to Sherri at Dr Luanna Cole office - [facility] on 02/22/16.

## 2016-02-24 ENCOUNTER — Ambulatory Visit (HOSPITAL_COMMUNITY)
Admission: RE | Admit: 2016-02-24 | Discharge: 2016-02-24 | Disposition: A | Discharge status: Home / Self Care | Payer: PPO | Source: Ambulatory Visit | Attending: Internal Medicine | Admitting: Internal Medicine

## 2016-02-24 ENCOUNTER — Encounter (HOSPITAL_COMMUNITY): Payer: Self-pay | Admitting: *Deleted

## 2016-02-24 ENCOUNTER — Encounter (HOSPITAL_COMMUNITY)
Admission: RE | Disposition: A | Discharge status: Home / Self Care | Payer: Self-pay | Source: Ambulatory Visit | Attending: Internal Medicine

## 2016-02-24 DIAGNOSIS — K5730 Diverticulosis of large intestine without perforation or abscess without bleeding: Secondary | ICD-10-CM | POA: Diagnosis not present

## 2016-02-24 DIAGNOSIS — Z79899 Other long term (current) drug therapy: Secondary | ICD-10-CM | POA: Diagnosis not present

## 2016-02-24 DIAGNOSIS — I429 Cardiomyopathy, unspecified: Secondary | ICD-10-CM | POA: Diagnosis not present

## 2016-02-24 DIAGNOSIS — Z7901 Long term (current) use of anticoagulants: Secondary | ICD-10-CM | POA: Diagnosis not present

## 2016-02-24 DIAGNOSIS — K644 Residual hemorrhoidal skin tags: Secondary | ICD-10-CM | POA: Insufficient documentation

## 2016-02-24 DIAGNOSIS — R195 Other fecal abnormalities: Secondary | ICD-10-CM

## 2016-02-24 DIAGNOSIS — N183 Chronic kidney disease, stage 3 (moderate): Secondary | ICD-10-CM | POA: Diagnosis not present

## 2016-02-24 DIAGNOSIS — E039 Hypothyroidism, unspecified: Secondary | ICD-10-CM | POA: Diagnosis not present

## 2016-02-24 DIAGNOSIS — Z8546 Personal history of malignant neoplasm of prostate: Secondary | ICD-10-CM | POA: Diagnosis not present

## 2016-02-24 DIAGNOSIS — I482 Chronic atrial fibrillation: Secondary | ICD-10-CM | POA: Diagnosis not present

## 2016-02-24 DIAGNOSIS — K5520 Angiodysplasia of colon without hemorrhage: Secondary | ICD-10-CM | POA: Diagnosis not present

## 2016-02-24 DIAGNOSIS — I129 Hypertensive chronic kidney disease with stage 1 through stage 4 chronic kidney disease, or unspecified chronic kidney disease: Secondary | ICD-10-CM | POA: Diagnosis not present

## 2016-02-24 DIAGNOSIS — K648 Other hemorrhoids: Secondary | ICD-10-CM | POA: Diagnosis not present

## 2016-02-24 DIAGNOSIS — K627 Radiation proctitis: Secondary | ICD-10-CM | POA: Insufficient documentation

## 2016-02-24 HISTORY — PX: COLONOSCOPY: SHX5424

## 2016-02-24 SURGERY — COLONOSCOPY
Anesthesia: Moderate Sedation

## 2016-02-24 MED ORDER — MIDAZOLAM HCL 5 MG/5ML IJ SOLN
INTRAMUSCULAR | Status: DC | PRN
  Administered 2016-02-24 (×3): 2 mg via INTRAVENOUS

## 2016-02-24 MED ORDER — SODIUM CHLORIDE 0.9 % IV SOLN
INTRAVENOUS | Status: DC
  Administered 2016-02-24: 12:00:00 via INTRAVENOUS

## 2016-02-24 MED ORDER — MEPERIDINE HCL 50 MG/ML IJ SOLN
INTRAMUSCULAR | Status: DC | PRN
  Administered 2016-02-24 (×2): 25 mg via INTRAVENOUS

## 2016-02-24 MED ORDER — MEPERIDINE HCL 50 MG/ML IJ SOLN
INTRAMUSCULAR | Status: AC
  Filled 2016-02-24: qty 1

## 2016-02-24 MED ORDER — MIDAZOLAM HCL 5 MG/5ML IJ SOLN
INTRAMUSCULAR | Status: AC
  Filled 2016-02-24: qty 10

## 2016-02-24 NOTE — Discharge Instructions (Signed)
Resume usual medications including Xarelto and Iron. High-fiber diet. No driving for 24 hours. Hemoglobin check by Dr. Hilma Favors in 3 to 4 months.   Colonoscopy, Adult, Care After This sheet gives you information about how to care for yourself after your procedure. Your doctor may also give you more specific instructions. If you have problems or questions, call your doctor. Follow these instructions at home: General instructions  For the first 24 hours after the procedure:  Do not drive or use machinery.  Do not sign important documents.  Do not drink alcohol.  Do your daily activities more slowly than normal.  Eat foods that are soft and easy to digest.  Rest often.  Take over-the-counter or prescription medicines only as told by your doctor.  It is up to you to get the results of your procedure. Ask your doctor, or the department performing the procedure, when your results will be ready. To help cramping and bloating:  Try walking around.  Put heat on your belly (abdomen) as told by your doctor. Use a heat source that your doctor recommends, such as a moist heat pack or a heating pad.  Put a towel between your skin and the heat source.  Leave the heat on for 20-30 minutes.  Remove the heat if your skin turns bright red. This is especially important if you cannot feel pain, heat, or cold. You can get burned. Eating and drinking  Drink enough fluid to keep your pee (urine) clear or pale yellow.  Return to your normal diet as told by your doctor. Avoid heavy or fried foods that are hard to digest.  Avoid drinking alcohol for as long as told by your doctor. Contact a doctor if:  You have blood in your poop (stool) 2-3 days after the procedure. Get help right away if:  You have more than a small amount of blood in your poop.  You see large clumps of tissue (blood clots) in your poop.  Your belly is swollen.  You feel sick to your stomach (nauseous).  You throw up  (vomit).  You have a fever.  You have belly pain that gets worse, and medicine does not help your pain. This information is not intended to replace advice given to you by your health care provider. Make sure you discuss any questions you have with your health care provider. Document Released: 01/28/2010 Document Revised: 09/20/2015 Document Reviewed: 09/20/2015 Elsevier Interactive Patient Education  2017 [employer]  Diverticulosis Diverticulosis is the condition that develops when small pouches (diverticula) form in the wall of your colon. Your colon, or large intestine, is where water is absorbed and stool is formed. The pouches form when the inside layer of your colon pushes through weak spots in the outer layers of your colon. CAUSES  No one knows exactly what causes diverticulosis. RISK FACTORS  Being older than 68. Your risk for this condition increases with age. Diverticulosis is rare in people younger than 40 years. By age 68, almost everyone has it.  Eating a low-fiber diet.  Being frequently constipated.  Being overweight.  Not getting enough exercise.  Smoking.  Taking over-the-counter pain medicines, like aspirin and ibuprofen. SYMPTOMS  Most people with diverticulosis do not have symptoms. DIAGNOSIS  Because diverticulosis often has no symptoms, health care providers often discover the condition during an exam for other colon problems. In many cases, a health care provider will diagnose diverticulosis while using a flexible scope to examine the colon (colonoscopy). TREATMENT  If  you have never developed an infection related to diverticulosis, you may not need treatment. If you have had an infection before, treatment may include:  Eating more fruits, vegetables, and grains.  Taking a fiber supplement.  Taking a live bacteria supplement (probiotic).  Taking medicine to relax your colon. HOME CARE INSTRUCTIONS   Drink at least 6-8 glasses of water each day to  prevent constipation.  Try not to strain when you have a bowel movement.  Keep all follow-up appointments. If you have had an infection before:  Increase the fiber in your diet as directed by your health care provider or dietitian.  Take a dietary fiber supplement if your health care provider approves.  Only take medicines as directed by your health care provider. SEEK MEDICAL CARE IF:   You have abdominal pain.  You have bloating.  You have cramps.  You have not gone to the bathroom in 3 days. SEEK IMMEDIATE MEDICAL CARE IF:   Your pain gets worse.  Yourbloating becomes very bad.  You have a fever or chills, and your symptoms suddenly get worse.  You begin vomiting.  You have bowel movements that are bloody or black. MAKE SURE YOU:  Understand these instructions.  Will watch your condition.  Will get help right away if you are not doing well or get worse. This information is not intended to replace advice given to you by your health care provider. Make sure you discuss any questions you have with your health care provider. Document Released: 09/23/2003 Document Revised: 12/31/2012 Document Reviewed: 11/20/2012 Elsevier Interactive Patient Education  2017 [employer]  Hemorrhoids Hemorrhoids are swollen veins in and around the rectum or anus. There are two types of hemorrhoids:  Internal hemorrhoids. These occur in the veins that are just inside the rectum. They may poke through to the outside and become irritated and painful.  External hemorrhoids. These occur in the veins that are outside of the anus and can be felt as a painful swelling or hard lump near the anus. Most hemorrhoids do not cause serious problems, and they can be managed with home treatments such as diet and lifestyle changes. If home treatments do not help your symptoms, procedures can be done to shrink or remove the hemorrhoids. What are the causes? This condition is caused by increased  pressure in the anal area. This pressure may result from various things, including:  Constipation.  Straining to have a bowel movement.  Diarrhea.  Pregnancy.  Obesity.  Sitting for long periods of time.  Heavy lifting or other activity that causes you to strain.  Anal sex. What are the signs or symptoms? Symptoms of this condition include:  Pain.  Anal itching or irritation.  Rectal bleeding.  Leakage of stool (feces).  Anal swelling.  One or more lumps around the anus. How is this diagnosed? This condition can often be diagnosed through a visual exam. Other exams or tests may also be done, such as:  Examination of the rectal area with a gloved hand (digital rectal exam).  Examination of the anal canal using a small tube (anoscope).  A blood test, if you have lost a significant amount of blood.  A test to look inside the colon (sigmoidoscopy or colonoscopy). How is this treated? This condition can usually be treated at home. However, various procedures may be done if dietary changes, lifestyle changes, and other home treatments do not help your symptoms. These procedures can help make the hemorrhoids smaller or remove them completely.  Some of these procedures involve surgery, and others do not. Common procedures include:  Rubber band ligation. Rubber bands are placed at the base of the hemorrhoids to cut off the blood supply to them.  Sclerotherapy. Medicine is injected into the hemorrhoids to shrink them.  Infrared coagulation. A type of light energy is used to get rid of the hemorrhoids.  Hemorrhoidectomy surgery. The hemorrhoids are surgically removed, and the veins that supply them are tied off.  Stapled hemorrhoidopexy surgery. A circular stapling device is used to remove the hemorrhoids and use staples to cut off the blood supply to them. Follow these instructions at home: Eating and drinking  Eat foods that have a lot of fiber in them, such as whole  grains, beans, nuts, fruits, and vegetables. Ask your health care provider about taking products that have added fiber (fiber supplements).  Drink enough fluid to keep your urine clear or pale yellow. Managing pain and swelling  Take warm sitz baths for 20 minutes, 3-4 times a day to ease pain and discomfort.  If directed, apply ice to the affected area. Using ice packs between sitz baths may be helpful.  Put ice in a plastic bag.  Place a towel between your skin and the bag.  Leave the ice on for 20 minutes, 2-3 times a day. General instructions  Take over-the-counter and prescription medicines only as told by your health care provider.  Use medicated creams or suppositories as told.  Exercise regularly.  Go to the bathroom when you have the urge to have a bowel movement. Do not wait.  Avoid straining to have bowel movements.  Keep the anal area dry and clean. Use wet toilet paper or moist towelettes after a bowel movement.  Do not sit on the toilet for long periods of time. This increases blood pooling and pain. Contact a health care provider if:  You have increasing pain and swelling that are not controlled by treatment or medicine.  You have uncontrolled bleeding.  You have difficulty having a bowel movement, or you are unable to have a bowel movement.  You have pain or inflammation outside the area of the hemorrhoids. This information is not intended to replace advice given to you by your health care provider. Make sure you discuss any questions you have with your health care provider. Document Released: 12/24/1999 Document Revised: 05/26/2015 Document Reviewed: 09/09/2014 Elsevier Interactive Patient Education  2017 [employer].  High-Fiber Diet Fiber, also called dietary fiber, is a type of carbohydrate found in fruits, vegetables, whole grains, and beans. A high-fiber diet can have many health benefits. Your health care provider may recommend a high-fiber diet to  help:  Prevent constipation. Fiber can make your bowel movements more regular.  Lower your cholesterol.  Relieve hemorrhoids, uncomplicated diverticulosis, or irritable bowel syndrome.  Prevent overeating as part of a weight-loss plan.  Prevent heart disease, type 2 diabetes, and certain cancers. What is my plan? The recommended daily intake of fiber includes:  38 grams for men under age 14.  93 grams for men over age 46.  58 grams for women under age 41.  23 grams for women over age 56. You can get the recommended daily intake of dietary fiber by eating a variety of fruits, vegetables, grains, and beans. Your health care provider may also recommend a fiber supplement if it is not possible to get enough fiber through your diet. What do I need to know about a high-fiber diet?  Fiber supplements have  not been widely studied for their effectiveness, so it is better to get fiber through food sources.  Always check the fiber content on thenutrition facts label of any prepackaged food. Look for foods that contain at least 5 grams of fiber per serving.  Ask your dietitian if you have questions about specific foods that are related to your condition, especially if those foods are not listed in the following section.  Increase your daily fiber consumption gradually. Increasing your intake of dietary fiber too quickly may cause bloating, cramping, or gas.  Drink plenty of water. Water helps you to digest fiber. What foods can I eat? Grains  Whole-grain breads. Multigrain cereal. Oats and oatmeal. Brown rice. Barley. Bulgur wheat. [place]. Bran muffins. Popcorn. Rye wafer crackers. Vegetables  Sweet potatoes. Spinach. Kale. Artichokes. Cabbage. Broccoli. Green peas. Carrots. Squash. Fruits  Berries. Pears. Apples. Oranges. Avocados. Prunes and raisins. Dried figs. Meats and Other Protein Sources  [place]y, kidney, pinto, and soy beans. Split peas. Lentils. Nuts and seeds. Dairy    Fiber-fortified yogurt. Beverages  Fiber-fortified soy milk. Fiber-fortified orange juice. Other  Fiber bars. The items listed above may not be a complete list of recommended foods or beverages. Contact your dietitian for more options.  What foods are not recommended? Grains  White bread. Pasta made with refined flour. White rice. Vegetables  Fried potatoes. Canned vegetables. Well-cooked vegetables. Fruits  Fruit juice. Cooked, strained fruit. Meats and Other Protein Sources  Fatty cuts of meat. Fried Sales executive or fried fish. Dairy  Milk. Yogurt. Cream cheese. Sour cream. Beverages  Soft drinks. Other  Cakes and pastries. Butter and oils. The items listed above may not be a complete list of foods and beverages to avoid. Contact your dietitian for more information.  What are some tips for including high-fiber foods in my diet?  Eat a wide variety of high-fiber foods.  Make sure that half of all grains consumed each day are whole grains.  Replace breads and cereals made from refined flour or white flour with whole-grain breads and cereals.  Replace white rice with brown rice, bulgur wheat, or millet.  Start the day with a breakfast that is high in fiber, such as a cereal that contains at least 5 grams of fiber per serving.  Use beans in place of meat in soups, salads, or pasta.  Eat high-fiber snacks, such as berries, raw vegetables, nuts, or popcorn. This information is not intended to replace advice given to you by your health care provider. Make sure you discuss any questions you have with your health care provider. Document Released: 12/26/2004 Document Revised: 06/03/2015 Document Reviewed: 06/10/2013 Elsevier Interactive Patient Education  2017 [employer].

## 2016-02-24 NOTE — H&P (Addendum)
[name] [name] is an 76 y.o. male.   Chief Complaint: Patient is here for colonoscopy. HPI: Patient is 76 year old Caucasian male with multiple medical problems was noted to have heme-positive stool. Recent hemoglobin was 12.2 g. He denies melena or rectal bleeding abdominal pain. He is on anticoagulant for A. fib. He states he had to have emergency surgery for angulated right inguinal hernia in September 2017 with small bowel resection. He states it took him several weeks to recover. Last colonoscopy was 5 years ago. Family history is negative for CRC.  Past Medical History:  Diagnosis Date  . Cardiomyopathy [facility])    h/o felt 2nd atrial fib, EF normal by echo 2013  . Chronic anticoagulation   . Chronic atrial fibrillation ([place])   . Chronic kidney disease, stage 3   . Hypothyroidism   . Pneumonia   . Prostate cancer ([place])   . Shortness of breath   . Systemic hypertension     Past Surgical History:  Procedure Laterality Date  . CARDIOVERSION    . CATARACT EXTRACTION W/PHACO  04/17/2011   Procedure: CATARACT EXTRACTION PHACO AND INTRAOCULAR LENS PLACEMENT (IOC);  Surgeon: [name], MD;  Location: [facility];  Service: Ophthalmology;  Laterality: Left;  CDE:14.58  . CATARACT EXTRACTION W/PHACO  05/22/2011   Procedure: CATARACT EXTRACTION PHACO AND INTRAOCULAR LENS PLACEMENT (IOC);  Surgeon: [name], MD;  Location: [facility];  Service: Ophthalmology;  Laterality: Right;  CDE 13.02  . COLONOSCOPY  01/17/2011   Procedure: COLONOSCOPY;  Surgeon: Jamesetta So;  Location: [facility];  Service: Gastroenterology;  Laterality: N/A;  . INGUINAL HERNIA REPAIR Right 09/27/2015   Procedure: RIGHT INGUINAL HERNIORRHAPY WITH MESH SMALL BOWEL RESECTION;  Surgeon: Aviva Signs, MD;  Location: [facility];  Service: General;  Laterality: Right;  . NM MYOCAR [place]  02/2009   dipyridamole; mild perfusion defect due to infarct/scar with mild perinfarct ischemia is apical septal, apical basal  inferoseptal, basal inferior, mid inferoseptal mid inferior & apical inferior; EKG negative for ischemia; low risk scan  . PROSTATE SURGERY     7 years ago [facility] Dr Javaid-prostatectomy  . PROSTATECTOMY    . Sleep Study  05/2011   increased upper airway resistance syndrome with mild sleep apnea during REM  . TRANSTHORACIC ECHOCARDIOGRAM  04/2011   EF=50-55%; RV mildly dilated; LA mild-mod dilated; mild mitral valve annular calcif, mild MR; mod TR, RVSP elevated 30-23mmHg, mild pulm HTN; AV mildly sclerotic, mild calcif of AV leaflets; trace pulm valve regurg     Family History  Problem Relation Age of Onset  . Diabetes Mother   . Prostate cancer Father   . Anesthesia problems Neg Hx   . Hypotension Neg Hx   . Malignant hyperthermia Neg Hx   . Pseudochol deficiency Neg Hx    Social History:  reports that he has never smoked. He has never used smokeless tobacco. He reports that he does not drink alcohol or use drugs.  Allergies: No Known Allergies  Medications Prior to Admission  Medication Sig Dispense Refill  . amiodarone (PACERONE) 200 MG tablet Take 1 tablet (200 mg total) by mouth twice daily for 2 weeks THEN DECREASE to 1 tablet ONCE daily. (Patient taking differently: Take 200 mg by mouth daily. ) 104 tablet 3  . Calcium Carb-Cholecalciferol (CALCIUM 600+D3) 600-800 MG-UNIT TABS Take 1 tablet by mouth 2 (two) times daily.    . Cholecalciferol (VITAMIN D3) 5000 units CAPS Take 1 capsule (5,000 Units total) by  mouth daily. 90 capsule 0  . ferrous sulfate (QC FERROUS SULFATE) 325 (65 FE) MG tablet Take 325 mg by mouth daily with breakfast.    . HYDROcodone-acetaminophen (NORCO/VICODIN) 5-325 MG tablet Take 1 tablet by mouth every 6 (six) hours as needed for moderate pain.    Marland Kitchen levothyroxine (SYNTHROID, LEVOTHROID) 50 MCG tablet Take 1 tablet (50 mcg total) by mouth every morning. 30 tablet 6  . metoprolol tartrate (LOPRESSOR) 25 MG tablet Take 1 tablet (25 mg total) by mouth 3 (three)  times daily. 180 tablet 3  . Rivaroxaban (XARELTO) 15 MG TABS tablet Take 1 tablet (15 mg total) by mouth daily with supper. 90 tablet 1  . Vitamin D, Ergocalciferol, (DRISDOL) 50000 units CAPS capsule Take 50,000 Units by mouth every 30 (thirty) days.      No results found for this or any previous visit (from the past 48 hour(s)). No results found.  ROS  Blood pressure (!) 146/98, pulse 83, temperature 98.2 F (36.8 C), temperature source Oral, resp. rate 14, height 6' 2.5" (1.892 m), weight 250 lb (113.4 kg), SpO2 99 %. Physical Exam  Constitutional: He appears well-developed and well-nourished.  HENT:  Mouth/Throat: Oropharynx is clear and moist.  Eyes: Conjunctivae are normal. No scleral icterus.  Neck: No thyromegaly present.  Cardiovascular:  Irregular rhythm normal S1 and S2. No murmur or gallop noted.  Respiratory: Effort normal and breath sounds normal.  GI:  Abdomen is full with lower midline scar. It is soft and nontender without organomegaly or masses.  Musculoskeletal: He exhibits no edema.  Lymphadenopathy:    He has no cervical adenopathy.  Neurological: He is alert.  Skin: Skin is warm and dry.     Assessment/Plan Heme-positive stool. Diagnostic Colonoscopy.  Hildred Laser, MD 02/24/2016, 1:23 PM

## 2016-02-24 NOTE — Op Note (Signed)
[facility] Patient Name: [name] [name] Procedure Date: 02/24/2016 1:18 PM MRN: [id number] Date of Birth: 08-Jan-1941 Attending MD: Hildred Laser , MD CSN: [id number] Age: 76 Admit Type: Outpatient Procedure:                Colonoscopy Indications:              Heme positive stool Providers:                Hildred Laser, MD, Charlyne Petrin RN, RN, Sherlyn Lees, Technician Referring MD:             Halford Chessman, MD Medicines:                Meperidine 50 mg IV, Midazolam 6 mg IV Complications:            No immediate complications. Estimated Blood Loss:     Estimated blood loss: none. Procedure:                Pre-Anesthesia Assessment:                           - Prior to the procedure, a History and Physical                            was performed, and patient medications and                            allergies were reviewed. The patient's tolerance of                            previous anesthesia was also reviewed. The risks                            and benefits of the procedure and the sedation                            options and risks were discussed with the patient.                            All questions were answered, and informed consent                            was obtained. Prior Anticoagulants: The patient                            last took Xarelto (rivaroxaban) 2 days prior to the                            procedure. ASA Grade Assessment: III - A patient                            with severe systemic disease. After reviewing the  risks and benefits, the patient was deemed in                            satisfactory condition to undergo the procedure.                           After obtaining informed consent, the colonoscope                            was passed under direct vision. Throughout the                            procedure, the patient's blood pressure, pulse, and           oxygen saturations were monitored continuously. The                            [id number] ([id number]) scope was introduced through                            the anus and advanced to the the cecum, identified                            by appendiceal orifice and ileocecal valve. The                            colonoscopy was performed without difficulty. The                            patient tolerated the procedure well. The quality                            of the bowel preparation was good. The ileocecal                            valve, appendiceal orifice, and rectum were                            photographed. Scope In: 1:33:42 PM Scope Out: 1:54:58 PM Scope Withdrawal Time: 0 hours 9 minutes 36 seconds  Total Procedure Duration: 0 hours 21 minutes 16 seconds  Findings:      The perianal and digital rectal examinations were normal.      A few medium-mouthed diverticula were found in the sigmoid colon and       descending colon.      A few small angioectasias without bleeding were found in the distal       rectum.      External and internal hemorrhoids were found during retroflexion. The       hemorrhoids were small. Impression:               - Diverticulosis in the sigmoid colon and in the                            descending colon.                           -  A few non-bleeding rectal angioectasias(radiation                            proctitis).                           - External and internal hemorrhoids.                           - No specimens collected. Moderate Sedation:      Moderate (conscious) sedation was administered by the endoscopy nurse       and supervised by the endoscopist. The following parameters were       monitored: oxygen saturation, heart rate, blood pressure, CO2       capnography and response to care. Total physician intraservice time was       26 minutes. Recommendation:           - Patient has a contact number available for                             emergencies. The signs and symptoms of potential                            delayed complications were discussed with the                            patient. Return to normal activities tomorrow.                            Written discharge instructions were provided to the                            patient.                           - High fiber diet today.                           - Continue present medications.                           - Resume Xarelto (rivaroxaban) at prior dose today.                           - No repeat colonoscopy due to age and the absence                            of advanced adenomas.                           - H&H in 3-4 months. Procedure Code(s):        --- Professional ---                           [phone], Colonoscopy, flexible; diagnostic, including  collection of specimen(s) by brushing or washing,                            when performed (separate procedure)                           99152, Moderate sedation services provided by the                            same physician or other qualified health care                            professional performing the diagnostic or                            therapeutic service that the sedation supports,                            requiring the presence of an independent trained                            observer to assist in the monitoring of the                            patient's level of consciousness and physiological                            status; initial 15 minutes of intraservice time,                            patient age 103 years or older                           [phone], Moderate sedation services; each additional                            15 minutes intraservice time Diagnosis Code(s):        --- Professional ---                           K64.8, Other hemorrhoids                           K55.20, Angiodysplasia of colon without hemorrhage                            R19.5, Other fecal abnormalities                           K57.30, Diverticulosis of large intestine without                            perforation or abscess without bleeding CPT copyright 2016 American Medical Association. All rights reserved. The codes documented in this report are preliminary and upon coder review may  be revised to meet current compliance requirements.  Hildred Laser, MD Hildred Laser, MD 02/24/2016 2:04:36 PM This report has been signed electronically. Number of Addenda: 0

## 2016-03-01 ENCOUNTER — Encounter (HOSPITAL_COMMUNITY): Payer: Self-pay | Admitting: Internal Medicine

## 2016-03-01 DIAGNOSIS — N183 Chronic kidney disease, stage 3 (moderate): Secondary | ICD-10-CM | POA: Diagnosis not present

## 2016-03-01 DIAGNOSIS — I10 Essential (primary) hypertension: Secondary | ICD-10-CM | POA: Diagnosis not present

## 2016-03-01 DIAGNOSIS — Z79899 Other long term (current) drug therapy: Secondary | ICD-10-CM | POA: Diagnosis not present

## 2016-03-01 DIAGNOSIS — R809 Proteinuria, unspecified: Secondary | ICD-10-CM | POA: Diagnosis not present

## 2016-03-01 DIAGNOSIS — E559 Vitamin D deficiency, unspecified: Secondary | ICD-10-CM | POA: Diagnosis not present

## 2016-03-01 DIAGNOSIS — D509 Iron deficiency anemia, unspecified: Secondary | ICD-10-CM | POA: Diagnosis not present

## 2016-03-03 DIAGNOSIS — D638 Anemia in other chronic diseases classified elsewhere: Secondary | ICD-10-CM | POA: Diagnosis not present

## 2016-03-03 DIAGNOSIS — N184 Chronic kidney disease, stage 4 (severe): Secondary | ICD-10-CM | POA: Diagnosis not present

## 2016-03-03 DIAGNOSIS — I10 Essential (primary) hypertension: Secondary | ICD-10-CM | POA: Diagnosis not present

## 2016-03-03 DIAGNOSIS — I509 Heart failure, unspecified: Secondary | ICD-10-CM | POA: Diagnosis not present

## 2016-03-03 DIAGNOSIS — E872 Acidosis: Secondary | ICD-10-CM | POA: Diagnosis not present

## 2016-03-10 DIAGNOSIS — Z6834 Body mass index (BMI) 34.0-34.9, adult: Secondary | ICD-10-CM | POA: Diagnosis not present

## 2016-03-10 DIAGNOSIS — I4891 Unspecified atrial fibrillation: Secondary | ICD-10-CM | POA: Diagnosis not present

## 2016-03-10 DIAGNOSIS — C61 Malignant neoplasm of prostate: Secondary | ICD-10-CM | POA: Diagnosis not present

## 2016-03-10 DIAGNOSIS — I43 Cardiomyopathy in diseases classified elsewhere: Secondary | ICD-10-CM | POA: Diagnosis not present

## 2016-03-10 DIAGNOSIS — E6609 Other obesity due to excess calories: Secondary | ICD-10-CM | POA: Diagnosis not present

## 2016-03-10 DIAGNOSIS — I429 Cardiomyopathy, unspecified: Secondary | ICD-10-CM | POA: Diagnosis not present

## 2016-03-10 DIAGNOSIS — E781 Pure hyperglyceridemia: Secondary | ICD-10-CM | POA: Diagnosis not present

## 2016-03-10 DIAGNOSIS — Z0001 Encounter for general adult medical examination with abnormal findings: Secondary | ICD-10-CM | POA: Diagnosis not present

## 2016-03-10 DIAGNOSIS — E785 Hyperlipidemia, unspecified: Secondary | ICD-10-CM | POA: Diagnosis not present

## 2016-03-10 DIAGNOSIS — N184 Chronic kidney disease, stage 4 (severe): Secondary | ICD-10-CM | POA: Diagnosis not present

## 2016-03-10 DIAGNOSIS — M25552 Pain in left hip: Secondary | ICD-10-CM | POA: Diagnosis not present

## 2016-03-10 DIAGNOSIS — Z1389 Encounter for screening for other disorder: Secondary | ICD-10-CM | POA: Diagnosis not present

## 2016-03-15 ENCOUNTER — Other Ambulatory Visit: Payer: Self-pay | Admitting: "Endocrinology

## 2016-03-31 ENCOUNTER — Encounter: Payer: Self-pay | Admitting: Cardiovascular Disease

## 2016-03-31 ENCOUNTER — Ambulatory Visit (INDEPENDENT_AMBULATORY_CARE_PROVIDER_SITE_OTHER): Payer: PPO | Admitting: Cardiovascular Disease

## 2016-03-31 VITALS — BP 118/74 | HR 75 | Ht 74.5 in | Wt 252.8 lb

## 2016-03-31 DIAGNOSIS — I951 Orthostatic hypotension: Secondary | ICD-10-CM | POA: Diagnosis not present

## 2016-03-31 DIAGNOSIS — Z7901 Long term (current) use of anticoagulants: Secondary | ICD-10-CM

## 2016-03-31 DIAGNOSIS — N183 Chronic kidney disease, stage 3 unspecified: Secondary | ICD-10-CM

## 2016-03-31 DIAGNOSIS — I481 Persistent atrial fibrillation: Secondary | ICD-10-CM | POA: Diagnosis not present

## 2016-03-31 DIAGNOSIS — I10 Essential (primary) hypertension: Secondary | ICD-10-CM | POA: Diagnosis not present

## 2016-03-31 DIAGNOSIS — E039 Hypothyroidism, unspecified: Secondary | ICD-10-CM

## 2016-03-31 DIAGNOSIS — I4819 Other persistent atrial fibrillation: Secondary | ICD-10-CM

## 2016-03-31 MED ORDER — METOPROLOL TARTRATE 50 MG PO TABS: 50.0000 mg | ORAL_TABLET | Freq: Two times a day (BID) | ORAL | 11 refills | Status: DC

## 2016-03-31 NOTE — Progress Notes (Signed)
Cardiology Office Note    Date:  03/31/2016   ID:  [name] [name], DOB [birth year], MRN [id number]  PCP:  [name], MD  Cardiologist:   [name], MD   Chief Complaint  Patient presents with  . Follow-up    Persistent Afib    History of Present Illness:  [name] [name] is a 76 y.o. 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   Ventricular rate control has been poor recently and we added amiodarone. This began when he underwent partial small bowel resection, complicated by acute kidney injury in September. Rate control was a challenge due to low blood pressure. Follow-up echo performed on August 21 does not show evidence of cardiomyopathy. EF is normal. Colonoscopy on February 15 did not show any active bleeding. He is tolerating anticoagulation with Xarelto without problems.  He is unaware of palpitations and has not had dizziness or syncope. He denies any focal neurological events and has never had a stroke or TIA.   He reports labs recently performed at [facility], not yet available for review.  A nuclear stress test in 2011 showed reported scar with mild peri-infarct ischemia in the apex and the inferior wall. It was felt to be a low risk scan since no significant ischemia was demonstrated. In 2013 left ventricular ejection fraction by echo was 50-55% without regional wall motion abnormalities identical with the follow-up echo in August 2017. Both atria were mildly dilated. No significant valvular abnormalities were reported. 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  Past Medical History:  Diagnosis Date  . Cardiomyopathy [facility])    h/o felt 2nd atrial fib, EF normal by echo 2013  . Chronic  anticoagulation   . Chronic atrial fibrillation ([place])   . Chronic kidney disease, stage 3   . Hypothyroidism   . Pneumonia   . Prostate cancer ([place])   . Shortness of breath   . Systemic hypertension     Past Surgical History:  Procedure Laterality Date  . CARDIOVERSION    . CATARACT EXTRACTION W/PHACO  04/17/2011   Procedure: CATARACT EXTRACTION PHACO AND INTRAOCULAR LENS PLACEMENT (IOC);  Surgeon: [name], MD;  Location: [facility] ORS;  Service: Ophthalmology;  Laterality: Left;  CDE:14.58  . CATARACT EXTRACTION W/PHACO  05/22/2011   Procedure: CATARACT EXTRACTION PHACO AND INTRAOCULAR LENS PLACEMENT (IOC);  Surgeon: [name], MD;  Location: [facility] ORS;  Service: Ophthalmology;  Laterality: Right;  CDE 13.02  . COLONOSCOPY  01/17/2011   Procedure: COLONOSCOPY;  Surgeon: Jamesetta So;  Location: [facility] SU[facility];  Service: Gastroenterology;  Laterality: N/A;  . COLONOSCOPY N/A 02/24/2016   Procedure: COLONOSCOPY;  Surgeon: Rogene [place], MD;  Location: [facility];  Service: Endoscopy;  Laterality: N/A;  12:50  . INGUINAL HERNIA REPAIR Right 09/27/2015   Procedure: RIGHT INGUINAL HERNIORRHAPY WITH MESH SMALL BOWEL RESECTION;  Surgeon: Aviva Signs, MD;  Location: [facility];  Service: General;  Laterality: Right;  . NM MYOCAR [place]  02/2009   dipyridamole; mild perfusion defect due to infarct/scar with mild perinfarct ischemia is apical septal, apical basal inferoseptal, basal inferior, mid inferoseptal mid inferior & apical inferior; EKG negative for ischemia; low risk scan  . PROSTATE SURGERY     7 years ago [facility] Dr Javaid-prostatectomy  .  PROSTATECTOMY    . Sleep Study  05/2011   increased upper airway resistance syndrome with mild sleep apnea during REM  . TRANSTHORACIC ECHOCARDIOGRAM  04/2011   EF=50-55%; RV mildly dilated; LA mild-mod dilated; mild mitral valve annular calcif, mild MR; mod TR, RVSP elevated 30-72mmHg, mild pulm HTN; AV mildly sclerotic, mild calcif of AV leaflets; trace  pulm valve regurg     Current Medications: Outpatient Medications Prior to Visit  Medication Sig Dispense Refill  . Calcium Carb-Cholecalciferol (CALCIUM 600+D3) 600-800 MG-UNIT TABS Take 1 tablet by mouth 2 (two) times daily.    . Cholecalciferol (VITAMIN D3) 5000 units CAPS Take 1 capsule (5,000 Units total) by mouth daily. 90 capsule 0  . ferrous sulfate (QC FERROUS SULFATE) 325 (65 FE) MG tablet Take 325 mg by mouth daily with breakfast.    . HYDROcodone-acetaminophen (NORCO/VICODIN) 5-325 MG tablet Take 1 tablet by mouth every 6 (six) hours as needed for moderate pain.    Marland Kitchen levothyroxine (SYNTHROID, LEVOTHROID) 50 MCG tablet TAKE ONE TABLET BY MOUTH IN THE MORNING 30 tablet 3  . Rivaroxaban (XARELTO) 15 MG TABS tablet Take 1 tablet (15 mg total) by mouth daily with supper. 90 tablet 1  . Vitamin D, Ergocalciferol, (DRISDOL) 50000 units CAPS capsule Take 50,000 Units by mouth every 30 (thirty) days.    Marland Kitchen amiodarone (PACERONE) 200 MG tablet Take 1 tablet (200 mg total) by mouth twice daily for 2 weeks THEN DECREASE to 1 tablet ONCE daily. (Patient taking differently: Take 200 mg by mouth daily. ) 104 tablet 3  . metoprolol tartrate (LOPRESSOR) 25 MG tablet Take 1 tablet (25 mg total) by mouth 3 (three) times daily. 180 tablet 3   Facility-Administered Medications Prior to Visit  Medication Dose Route Frequency Provider Last Rate Last Dose  . neomycin-polymyxin-dexameth (MAXITROL) 0.1 % ophth ointment    PRN [name], MD   1 application at 55/97/41 [id number]     Allergies:   Patient has no known allergies.   Social History   Social History  . Marital status: Single    Spouse name: N/A  . Number of children: 2  . Years of education: N/A   Social History Main Topics  . Smoking status: Never Smoker  . Smokeless tobacco: Never Used  . Alcohol use No  . Drug use: No  . Sexual activity: Yes    Birth control/ protection: None   Other Topics Concern  . None   Social History Narrative   . None     Family History:  The patient's  family history includes Diabetes in his mother; Prostate cancer in his father.   ROS:   Please see the history of present illness.    ROS All other systems reviewed and are negative.   PHYSICAL EXAM:   VS:  BP 118/74   Pulse 75   Ht 6' 2.5" (1.892 m)   Wt 114.7 kg (252 lb 12.8 oz)   BMI 32.02 kg/m    GEN: Well nourished, well developed, in no acute distress  HEENT: normal  Neck: no JVD, carotid bruits, or masses Cardiac: Irregular, no murmurs, rubs, or gallops,no edema  Respiratory:  clear to auscultation bilaterally, normal work of breathing GI: soft, nontender, nondistended, + BS MS: no deformity or atrophy  Skin: warm and dry, no rash Neuro:  Alert and Oriented x 3, Strength and sensation are intact Psych: euthymic mood, full affect  Wt Readings from Last 3 Encounters:  03/31/16 114.7 kg (252 lb  12.8 oz)  02/24/16 113.4 kg (250 lb)  01/17/16 113.9 kg (251 lb)      Studies/Labs Reviewed:   EKG:  EKG is ordered today.  The ekg ordered today demonstrates Atrial fibrillation and left anterior fascicular block  Recent Labs: 09/23/2015: TSH 1.673 10/06/2015: Magnesium 1.9 10/28/2015: ALT 13; BUN 32; Creatinine, Ser 1.87; Potassium 4.3; Sodium 136 11/22/2015: Hemoglobin 12.5; Platelets 358   Lipid Panel    Component Value Date/Time   CHOL  02/04/2009 0538    142        ATP III CLASSIFICATION:  <200     mg/dL   Desirable  200-239  mg/dL   Borderline High  >=240    mg/dL   High          TRIG 122 02/04/2009 0538   HDL 33 (L) 02/04/2009 0538   CHOLHDL 4.3 02/04/2009 0538   VLDL 24 02/04/2009 0538   LDLCALC  02/04/2009 0538    85        Total Cholesterol/HDL:CHD Risk Coronary Heart Disease Risk Table                     Men   Women  1/2 Average Risk   3.4   3.3  Average Risk       5.0   4.4  2 X Average Risk   9.6   7.1  3 X Average Risk  23.4   11.0        Use the calculated Patient Ratio above and the CHD Risk  Table to determine the patient's CHD Risk.        ATP III CLASSIFICATION (LDL):  <100     mg/dL   Optimal  100-129  mg/dL   Near or Above                    Optimal  130-159  mg/dL   Borderline  160-189  mg/dL   High  >190     mg/dL   Very High    September 2017  TSH 1.67 October 2017   normal liver function tests December 2017  creatinine 2.16, normal iron studies, hemoglobin 12.7   ASSESSMENT:    1. Persistent atrial fibrillation ([place])   2. Essential hypertension   3. Orthostatic syncope   4. Long term current use of anticoagulant therapy   5. Acquired hypothyroidism   6. CKD (chronic kidney disease) stage 3, GFR 30-59 ml/min      PLAN:  In order of problems listed above:  1. AFib: Rate control appears substantially improved. Amiodarone provides excellent rate control and was added since there was no room to increase his beta blocker due to low blood pressure. It is likely that anemia and deconditioning played a role in these developments, following his abdominal surgery. Blood pressure is substantially higher and should allow [place] to use a higher dose of beta blocker. Increased it to 50 mg twice daily. CHADSVasc 4 (age 38, HTN, HF). 2. HTN: Excellent control.  3. history of orthostatic syncope: Expect less likely to occurr now that his hemoglobin has normalized and his renal function has stabilized 4. Xarelto: No history of stroke/TIA and no bleeding complications since his last visit. Favorable findings on colonoscopy with nonbleeding diverticulosis 5. Hypothyroidism: on appropriate dose of levothyroxine. Should be stable once we stop the amiodarone 6. CKD stage 3: Type coagulant dose adjusted for renal function    Medication Adjustments/Labs and Tests Ordered: Current medicines are  reviewed at length with the patient today.  Concerns regarding medicines are outlined above.  Medication changes, Labs and Tests ordered today are listed in the Patient Instructions  below. Patient Instructions  Dr Sallyanne Kuster has recommended making the following medication changes: 1. STOP Amiodarone 2. ON April 1st, INCREASE Metoprolol to 50 mg twice daily  Your physician recommends that you schedule a follow-up appointment in 4 months with Dr C.  If you need a refill on your cardiac medications before your next appointment, please call your pharmacy.    Signed, [name], MD  03/31/2016 3:17 PM    [facility] [place], [place],   [address] Phone: [phone]; Fax: [phone]

## 2016-03-31 NOTE — Patient Instructions (Addendum)
Dr Sallyanne Kuster has recommended making the following medication changes: 1. STOP Amiodarone 2. ON April 1st, INCREASE Metoprolol to 50 mg twice daily  Your physician recommends that you schedule a follow-up appointment in 4 months with Dr C.  If you need a refill on your cardiac medications before your next appointment, please call your pharmacy.

## 2016-04-19 ENCOUNTER — Encounter (HOSPITAL_COMMUNITY)
Admission: RE | Admit: 2016-04-19 | Discharge: 2016-04-19 | Disposition: A | Discharge status: Home / Self Care | Payer: PPO | Source: Ambulatory Visit | Attending: Orthopedic Surgery | Admitting: Orthopedic Surgery

## 2016-04-19 ENCOUNTER — Encounter (HOSPITAL_COMMUNITY): Payer: Self-pay

## 2016-04-19 DIAGNOSIS — Z01812 Encounter for preprocedural laboratory examination: Secondary | ICD-10-CM | POA: Diagnosis not present

## 2016-04-19 DIAGNOSIS — M1612 Unilateral primary osteoarthritis, left hip: Secondary | ICD-10-CM | POA: Insufficient documentation

## 2016-04-19 HISTORY — DX: Heart failure, unspecified: I50.9

## 2016-04-19 HISTORY — DX: Prediabetes: R73.03

## 2016-04-19 HISTORY — DX: Chronic kidney disease, stage 4 (severe): N18.4

## 2016-04-19 LAB — GLUCOSE, CAPILLARY: Glucose-Capillary: 95 mg/dL (ref 65–99)

## 2016-04-19 LAB — CBC
HCT: 40.7 % (ref 39.0–52.0)
HEMOGLOBIN: 13.5 g/dL (ref 13.0–17.0)
MCH: 32 pg (ref 26.0–34.0)
MCHC: 33.2 g/dL (ref 30.0–36.0)
MCV: 96.4 fL (ref 78.0–100.0)
PLATELETS: 223 10*3/uL (ref 150–400)
RBC: 4.22 MIL/uL (ref 4.22–5.81)
RDW: 13.9 % (ref 11.5–15.5)
WBC: 7.7 10*3/uL (ref 4.0–10.5)

## 2016-04-19 LAB — BASIC METABOLIC PANEL
ANION GAP: 12 (ref 5–15)
BUN: 20 mg/dL (ref 6–20)
CHLORIDE: 107 mmol/L (ref 101–111)
CO2: 23 mmol/L (ref 22–32)
Calcium: 9.2 mg/dL (ref 8.9–10.3)
Creatinine, Ser: 1.96 mg/dL — ABNORMAL HIGH (ref 0.61–1.24)
GFR calc non Af Amer: 31 mL/min — ABNORMAL LOW (ref >60)
GFR, EST AFRICAN AMERICAN: 36 mL/min — AB (ref >60)
Glucose, Bld: 96 mg/dL (ref 65–99)
POTASSIUM: 4.3 mmol/L (ref 3.5–5.1)
Sodium: 142 mmol/L (ref 135–145)

## 2016-04-19 LAB — SURGICAL PCR SCREEN
MRSA, PCR: NEGATIVE
STAPHYLOCOCCUS AUREUS: NEGATIVE

## 2016-04-19 NOTE — Progress Notes (Addendum)
LEFT MESSAGE WITH SHERRY AT DR. LANDAU'S OFFICE OF NEEDING PREOP ORDERS AND TO FAX CLEARANCE NOTES FROM CARDIOLOGIST AND NEPHROLOGIST.  PATIENT'S SON , CRAIG STATED HE WAS INSTRUCTED BY CARDIOLOGIST TO STOP XARELTO 3-4 DAYS PRIOR TO SURGERY.

## 2016-04-19 NOTE — Pre-Procedure Instructions (Signed)
    Jeanne Terrance Stengel  04/19/2016      [facility], [place] - [address] [place] [address] Phone: [phone] Fax: [phone]    Your procedure is scheduled on   Tuesday  04/25/16  Report to [facility] Admitting at 850 A.M.  Call this number if you have problems the morning of surgery:  [phone]   Remember:  Do not eat food or drink liquids after midnight.  Take these medicines the morning of surgery with A SIP OF WATER   HYDROCODONE IF NEEDED, LEVOTHYROXINE, METOPROLOL (LOPRESSOR)           (STOP XARELTO AS DIRECTED)   Do not wear jewelry, make-up or nail polish.  Do not wear lotions, powders, or perfumes, or deoderant.  Do not shave 48 hours prior to surgery.  Men may shave face and neck.  Do not bring valuables to the hospital.  [facility] is not responsible for any belongings or valuables.  Contacts, dentures or bridgework may not be worn into surgery.  Leave your suitcase in the car.  After surgery it may be brought to your room.  For patients admitted to the hospital, discharge time will be determined by your treatment team.  Patients discharged the day of surgery will not be allowed to drive home.   Name and phone number of your driver:   Please read over the following fact sheets that you were given. MRSA Information and Surgical Site Infection Prevention

## 2016-04-20 ENCOUNTER — Other Ambulatory Visit: Payer: Self-pay | Admitting: Orthopedic Surgery

## 2016-04-20 ENCOUNTER — Encounter (HOSPITAL_COMMUNITY): Payer: Self-pay | Admitting: Emergency Medicine

## 2016-04-21 ENCOUNTER — Encounter (HOSPITAL_COMMUNITY): Payer: Self-pay

## 2016-04-21 NOTE — Progress Notes (Signed)
Anesthesia Chart Review:  Pt is a 76 year old male scheduled for L total hip arthroplasty on 04/25/2016 with Marchia Bond, M.D.  - PCP is [name], MD, who cleared pt for surgery.  - Nephrologist is Dr. Lowanda Foster; pt cleared for surgery by Dr. Candiss Norse in that office.  - Cardiologist is [name], MD, who cleared pt for surgery. Last office visit 03/31/16.   PMH includes: CHF, cardiomyopathy (recovered 2013), chronic atrial fibrillation, HTN, pre-diabetes, CKD (stage 4), hypothyroidism, prostate cancer. Never smoker. BMI 32. S/p R inguinal herniorrhaphy, small bowel resection 09/27/15. S/p cataract extraction 04/17/11 and 05/22/11.   Medications include: Iron, levothyroxine, metoprolol, xarelto.  Pt to stop xarelto 3-4 days before surgery.   Preoperative labs reviewed.  Cr 1.96, BUN 20.  Renal function consistent with prior results.   1 view CXR 09/24/15: No evidence of acute cardiopulmonary disease.  EKG 03/31/16: atrial fibrillation (75 bpm). LAFB.   Echo 08/30/15:  - Left ventricle: The cavity size was normal. Wall thickness was increased in a pattern of mild LVH. There was mild focal basal hypertrophy of the septum. Systolic function was normal. The estimated ejection fraction was in the range of 50% to 55%. Wall motion was normal; there were no regional wall motion abnormalities. Left ventricular diastolic function parameters were normal. - Mitral valve: There was mild regurgitation. - Left atrium: The atrium was mildly dilated. - Right atrium: The atrium was mildly dilated. - Atrial septum: No defect or patent foramen ovale was identified. - Tricuspid valve: There was mild-moderate regurgitation. - Pulmonary arteries: PA peak pressure: 41 mm Hg (S).  Carotid duplex 07/16/12: less than 50% diameter stenoses bilaterally.  If no changes, I anticipate pt can proceed with surgery as scheduled.   Willeen Cass, FNP-BC [facility]/Anesthesiology Phone:  [phone] 04/21/2016 2:27 PM

## 2016-04-25 ENCOUNTER — Encounter (HOSPITAL_COMMUNITY): Admission: RE | Payer: Self-pay | Source: Ambulatory Visit

## 2016-04-25 ENCOUNTER — Inpatient Hospital Stay (HOSPITAL_COMMUNITY): Admission: RE | Admit: 2016-04-25 | Payer: PPO | Source: Ambulatory Visit | Admitting: Orthopedic Surgery

## 2016-04-25 SURGERY — ARTHROPLASTY, HIP, TOTAL,POSTERIOR APPROACH
Anesthesia: Choice | Laterality: Left

## 2016-04-28 ENCOUNTER — Other Ambulatory Visit: Payer: Self-pay | Admitting: Orthopedic Surgery

## 2016-05-01 MED ORDER — CEFAZOLIN SODIUM-DEXTROSE 2-4 GM/100ML-% IV SOLN
2.0000 g | INTRAVENOUS | Status: AC
  Administered 2016-05-02: 2 g via INTRAVENOUS
  Filled 2016-05-01: qty 100

## 2016-05-02 ENCOUNTER — Inpatient Hospital Stay (HOSPITAL_COMMUNITY): Payer: PPO | Admitting: Anesthesiology

## 2016-05-02 ENCOUNTER — Encounter (HOSPITAL_COMMUNITY)
Admission: RE | Disposition: A | Discharge status: Home Health | Payer: Self-pay | Source: Ambulatory Visit | Attending: Orthopedic Surgery

## 2016-05-02 ENCOUNTER — Inpatient Hospital Stay (HOSPITAL_COMMUNITY): Payer: PPO

## 2016-05-02 ENCOUNTER — Encounter (HOSPITAL_COMMUNITY): Payer: Self-pay | Admitting: Certified Registered Nurse Anesthetist

## 2016-05-02 ENCOUNTER — Inpatient Hospital Stay (HOSPITAL_COMMUNITY)
Admission: RE | Admit: 2016-05-02 | Discharge: 2016-05-04 | DRG: 470 | Disposition: A | Discharge status: Home Health | Payer: PPO | Source: Ambulatory Visit | Attending: Orthopedic Surgery | Admitting: Orthopedic Surgery

## 2016-05-02 DIAGNOSIS — Z8042 Family history of malignant neoplasm of prostate: Secondary | ICD-10-CM

## 2016-05-02 DIAGNOSIS — Z9842 Cataract extraction status, left eye: Secondary | ICD-10-CM

## 2016-05-02 DIAGNOSIS — N359 Urethral stricture, unspecified: Secondary | ICD-10-CM | POA: Diagnosis present

## 2016-05-02 DIAGNOSIS — N184 Chronic kidney disease, stage 4 (severe): Secondary | ICD-10-CM | POA: Diagnosis not present

## 2016-05-02 DIAGNOSIS — I429 Cardiomyopathy, unspecified: Secondary | ICD-10-CM | POA: Diagnosis not present

## 2016-05-02 DIAGNOSIS — I482 Chronic atrial fibrillation: Secondary | ICD-10-CM | POA: Diagnosis not present

## 2016-05-02 DIAGNOSIS — Z9841 Cataract extraction status, right eye: Secondary | ICD-10-CM | POA: Diagnosis not present

## 2016-05-02 DIAGNOSIS — Z79891 Long term (current) use of opiate analgesic: Secondary | ICD-10-CM | POA: Diagnosis not present

## 2016-05-02 DIAGNOSIS — Z8546 Personal history of malignant neoplasm of prostate: Secondary | ICD-10-CM | POA: Diagnosis not present

## 2016-05-02 DIAGNOSIS — M1612 Unilateral primary osteoarthritis, left hip: Secondary | ICD-10-CM | POA: Diagnosis not present

## 2016-05-02 DIAGNOSIS — Z7901 Long term (current) use of anticoagulants: Secondary | ICD-10-CM | POA: Diagnosis not present

## 2016-05-02 DIAGNOSIS — E039 Hypothyroidism, unspecified: Secondary | ICD-10-CM | POA: Diagnosis present

## 2016-05-02 DIAGNOSIS — Z79899 Other long term (current) drug therapy: Secondary | ICD-10-CM | POA: Diagnosis not present

## 2016-05-02 DIAGNOSIS — Z96642 Presence of left artificial hip joint: Secondary | ICD-10-CM | POA: Diagnosis not present

## 2016-05-02 DIAGNOSIS — I4891 Unspecified atrial fibrillation: Secondary | ICD-10-CM | POA: Diagnosis not present

## 2016-05-02 DIAGNOSIS — Z833 Family history of diabetes mellitus: Secondary | ICD-10-CM

## 2016-05-02 DIAGNOSIS — R7303 Prediabetes: Secondary | ICD-10-CM | POA: Diagnosis not present

## 2016-05-02 DIAGNOSIS — M1610 Unilateral primary osteoarthritis, unspecified hip: Secondary | ICD-10-CM

## 2016-05-02 DIAGNOSIS — Z961 Presence of intraocular lens: Secondary | ICD-10-CM | POA: Diagnosis not present

## 2016-05-02 DIAGNOSIS — I130 Hypertensive heart and chronic kidney disease with heart failure and stage 1 through stage 4 chronic kidney disease, or unspecified chronic kidney disease: Secondary | ICD-10-CM | POA: Diagnosis present

## 2016-05-02 DIAGNOSIS — Z471 Aftercare following joint replacement surgery: Secondary | ICD-10-CM | POA: Diagnosis not present

## 2016-05-02 HISTORY — PX: TOTAL HIP ARTHROPLASTY: SHX124

## 2016-05-02 HISTORY — DX: Unilateral primary osteoarthritis, left hip: M16.12

## 2016-05-02 LAB — GLUCOSE, CAPILLARY
Glucose-Capillary: 108 mg/dL — ABNORMAL HIGH (ref 65–99)
Glucose-Capillary: 93 mg/dL (ref 65–99)

## 2016-05-02 SURGERY — ARTHROPLASTY, HIP, TOTAL,POSTERIOR APPROACH
Anesthesia: Spinal | Site: Hip | Laterality: Left

## 2016-05-02 MED ORDER — CALCIUM CARBONATE ANTACID 500 MG PO CHEW
1.0000 | CHEWABLE_TABLET | Freq: Two times a day (BID) | ORAL | Status: DC
  Administered 2016-05-02 – 2016-05-04 (×4): 200 mg via ORAL
  Filled 2016-05-02 (×4): qty 1

## 2016-05-02 MED ORDER — BACLOFEN 10 MG PO TABS: 10.0000 mg | ORAL_TABLET | Freq: Three times a day (TID) | ORAL | 0 refills | Status: DC

## 2016-05-02 MED ORDER — METHOCARBAMOL 1000 MG/10ML IJ SOLN
500.0000 mg | Freq: Four times a day (QID) | INTRAVENOUS | Status: DC | PRN
  Filled 2016-05-02: qty 5

## 2016-05-02 MED ORDER — ONDANSETRON HCL 4 MG PO TABS: 4.0000 mg | ORAL_TABLET | Freq: Four times a day (QID) | ORAL | Status: DC | PRN

## 2016-05-02 MED ORDER — BUPIVACAINE HCL (PF) 0.25 % IJ SOLN
INTRAMUSCULAR | Status: AC
Start: 2016-05-02 — End: 2016-05-02
  Filled 2016-05-02: qty 30

## 2016-05-02 MED ORDER — SUGAMMADEX SODIUM 200 MG/2ML IV SOLN
INTRAVENOUS | Status: AC
  Filled 2016-05-02: qty 2

## 2016-05-02 MED ORDER — ONDANSETRON HCL 4 MG PO TABS: 4.0000 mg | ORAL_TABLET | Freq: Three times a day (TID) | ORAL | 0 refills | Status: DC | PRN

## 2016-05-02 MED ORDER — RIVAROXABAN 15 MG PO TABS
15.0000 mg | ORAL_TABLET | Freq: Every day | ORAL | Status: DC
  Administered 2016-05-03: 15 mg via ORAL
  Filled 2016-05-02 (×2): qty 1

## 2016-05-02 MED ORDER — SENNA 8.6 MG PO TABS
1.0000 | ORAL_TABLET | Freq: Two times a day (BID) | ORAL | Status: DC
  Administered 2016-05-02 – 2016-05-04 (×4): 8.6 mg via ORAL
  Filled 2016-05-02 (×4): qty 1

## 2016-05-02 MED ORDER — FENTANYL CITRATE (PF) 250 MCG/5ML IJ SOLN
INTRAMUSCULAR | Status: AC
  Filled 2016-05-02: qty 5

## 2016-05-02 MED ORDER — ONDANSETRON HCL 4 MG/2ML IJ SOLN: 4.0000 mg | Freq: Four times a day (QID) | INTRAMUSCULAR | Status: DC | PRN

## 2016-05-02 MED ORDER — MENTHOL 3 MG MT LOZG: 1.0000 | LOZENGE | OROMUCOSAL | Status: DC | PRN

## 2016-05-02 MED ORDER — VITAMIN D3 25 MCG (1000 UNIT) PO TABS
5000.0000 [IU] | ORAL_TABLET | Freq: Every day | ORAL | Status: DC
  Administered 2016-05-03 – 2016-05-04 (×2): 5000 [IU] via ORAL
  Filled 2016-05-02 (×5): qty 5

## 2016-05-02 MED ORDER — CALCIUM CARB-CHOLECALCIFEROL 600-800 MG-UNIT PO TABS: 1.0000 | ORAL_TABLET | Freq: Two times a day (BID) | ORAL | Status: DC

## 2016-05-02 MED ORDER — HYDROMORPHONE HCL 1 MG/ML IJ SOLN: 0.5000 mg | INTRAMUSCULAR | Status: DC | PRN

## 2016-05-02 MED ORDER — DIPHENHYDRAMINE HCL 12.5 MG/5ML PO ELIX: 12.5000 mg | ORAL_SOLUTION | ORAL | Status: DC | PRN

## 2016-05-02 MED ORDER — METOPROLOL TARTRATE 50 MG PO TABS
50.0000 mg | ORAL_TABLET | Freq: Two times a day (BID) | ORAL | Status: DC
  Administered 2016-05-02 – 2016-05-04 (×4): 50 mg via ORAL
  Filled 2016-05-02 (×4): qty 1

## 2016-05-02 MED ORDER — PHENYLEPHRINE 40 MCG/ML (10ML) SYRINGE FOR IV PUSH (FOR BLOOD PRESSURE SUPPORT)
PREFILLED_SYRINGE | INTRAVENOUS | Status: AC
  Filled 2016-05-02: qty 10

## 2016-05-02 MED ORDER — OXYCODONE HCL 5 MG PO TABS
5.0000 mg | ORAL_TABLET | ORAL | Status: DC | PRN
  Administered 2016-05-02: 5 mg via ORAL
  Administered 2016-05-03: 10 mg via ORAL
  Administered 2016-05-03 (×2): 5 mg via ORAL
  Administered 2016-05-03 – 2016-05-04 (×4): 10 mg via ORAL
  Administered 2016-05-04 (×2): 5 mg via ORAL
  Filled 2016-05-02 (×2): qty 1
  Filled 2016-05-02: qty 2
  Filled 2016-05-02: qty 1
  Filled 2016-05-02 (×3): qty 2
  Filled 2016-05-02: qty 1
  Filled 2016-05-02: qty 2
  Filled 2016-05-02: qty 1

## 2016-05-02 MED ORDER — ONDANSETRON HCL 4 MG/2ML IJ SOLN
INTRAMUSCULAR | Status: DC | PRN
  Administered 2016-05-02: 4 mg via INTRAVENOUS

## 2016-05-02 MED ORDER — CEFAZOLIN SODIUM-DEXTROSE 2-4 GM/100ML-% IV SOLN
2.0000 g | Freq: Four times a day (QID) | INTRAVENOUS | Status: AC
  Administered 2016-05-02 – 2016-05-03 (×2): 2 g via INTRAVENOUS
  Filled 2016-05-02 (×2): qty 100

## 2016-05-02 MED ORDER — 0.9 % SODIUM CHLORIDE (POUR BTL) OPTIME
TOPICAL | Status: DC | PRN
  Administered 2016-05-02: 1000 mL

## 2016-05-02 MED ORDER — PHENOL 1.4 % MT LIQD: 1.0000 | OROMUCOSAL | Status: DC | PRN

## 2016-05-02 MED ORDER — PROPOFOL 500 MG/50ML IV EMUL
INTRAVENOUS | Status: DC | PRN
  Administered 2016-05-02: 60 ug/kg/min via INTRAVENOUS

## 2016-05-02 MED ORDER — DOCUSATE SODIUM 100 MG PO CAPS
100.0000 mg | ORAL_CAPSULE | Freq: Two times a day (BID) | ORAL | Status: DC
  Administered 2016-05-02 – 2016-05-04 (×4): 100 mg via ORAL
  Filled 2016-05-02 (×4): qty 1

## 2016-05-02 MED ORDER — PHENYLEPHRINE HCL 10 MG/ML IJ SOLN
INTRAVENOUS | Status: DC | PRN
  Administered 2016-05-02: 30 ug/min via INTRAVENOUS

## 2016-05-02 MED ORDER — BISACODYL 10 MG RE SUPP: 10.0000 mg | Freq: Every day | RECTAL | Status: DC | PRN

## 2016-05-02 MED ORDER — SENNA-DOCUSATE SODIUM 8.6-50 MG PO TABS: 2.0000 | ORAL_TABLET | Freq: Every day | ORAL | 1 refills | Status: DC

## 2016-05-02 MED ORDER — PROPOFOL 10 MG/ML IV BOLUS
INTRAVENOUS | Status: AC
Start: 2016-05-02 — End: 2016-05-02
  Filled 2016-05-02: qty 20

## 2016-05-02 MED ORDER — ALUM & MAG HYDROXIDE-SIMETH 200-200-20 MG/5ML PO SUSP: 30.0000 mL | ORAL | Status: DC | PRN

## 2016-05-02 MED ORDER — ONDANSETRON HCL 4 MG/2ML IJ SOLN
INTRAMUSCULAR | Status: AC
  Filled 2016-05-02: qty 2

## 2016-05-02 MED ORDER — POLYETHYLENE GLYCOL 3350 17 G PO PACK: 17.0000 g | PACK | Freq: Every day | ORAL | Status: DC | PRN

## 2016-05-02 MED ORDER — POTASSIUM CHLORIDE IN NACL 20-0.45 MEQ/L-% IV SOLN
INTRAVENOUS | Status: DC
  Administered 2016-05-03: 01:00:00 via INTRAVENOUS
  Filled 2016-05-02 (×2): qty 1000

## 2016-05-02 MED ORDER — METHOCARBAMOL 500 MG PO TABS
500.0000 mg | ORAL_TABLET | Freq: Four times a day (QID) | ORAL | Status: DC | PRN
  Administered 2016-05-02 – 2016-05-04 (×6): 500 mg via ORAL
  Filled 2016-05-02 (×6): qty 1

## 2016-05-02 MED ORDER — PROPOFOL 10 MG/ML IV BOLUS
INTRAVENOUS | Status: DC | PRN
  Administered 2016-05-02: 20 mg via INTRAVENOUS

## 2016-05-02 MED ORDER — LACTATED RINGERS IV SOLN
INTRAVENOUS | Status: DC
  Administered 2016-05-02 (×3): via INTRAVENOUS

## 2016-05-02 MED ORDER — SODIUM BICARBONATE 650 MG PO TABS
650.0000 mg | ORAL_TABLET | Freq: Two times a day (BID) | ORAL | Status: DC
  Administered 2016-05-02 – 2016-05-04 (×4): 650 mg via ORAL
  Filled 2016-05-02 (×4): qty 1

## 2016-05-02 MED ORDER — METOCLOPRAMIDE HCL 5 MG/ML IJ SOLN: 5.0000 mg | Freq: Three times a day (TID) | INTRAMUSCULAR | Status: DC | PRN

## 2016-05-02 MED ORDER — PHENYLEPHRINE HCL 10 MG/ML IJ SOLN
INTRAMUSCULAR | Status: DC | PRN
  Administered 2016-05-02 (×2): 80 ug via INTRAVENOUS
  Administered 2016-05-02: 160 ug via INTRAVENOUS
  Administered 2016-05-02: 80 ug via INTRAVENOUS
  Administered 2016-05-02: 120 ug via INTRAVENOUS

## 2016-05-02 MED ORDER — ACETAMINOPHEN 325 MG PO TABS: 650.0000 mg | ORAL_TABLET | Freq: Four times a day (QID) | ORAL | Status: DC | PRN

## 2016-05-02 MED ORDER — OXYCODONE HCL 5 MG PO TABS: 5.0000 mg | ORAL_TABLET | ORAL | 0 refills | Status: DC | PRN

## 2016-05-02 MED ORDER — LEVOTHYROXINE SODIUM 50 MCG PO TABS
50.0000 ug | ORAL_TABLET | Freq: Every morning | ORAL | Status: DC
  Administered 2016-05-03 – 2016-05-04 (×2): 50 ug via ORAL
  Filled 2016-05-02 (×2): qty 1

## 2016-05-02 MED ORDER — METOCLOPRAMIDE HCL 5 MG PO TABS: 5.0000 mg | ORAL_TABLET | Freq: Three times a day (TID) | ORAL | Status: DC | PRN

## 2016-05-02 MED ORDER — ZOLPIDEM TARTRATE 5 MG PO TABS: 5.0000 mg | ORAL_TABLET | Freq: Every evening | ORAL | Status: DC | PRN

## 2016-05-02 MED ORDER — GABAPENTIN 300 MG PO CAPS
300.0000 mg | ORAL_CAPSULE | Freq: Three times a day (TID) | ORAL | Status: DC
  Administered 2016-05-02 – 2016-05-04 (×5): 300 mg via ORAL
  Filled 2016-05-02 (×5): qty 1

## 2016-05-02 MED ORDER — FENTANYL CITRATE (PF) 100 MCG/2ML IJ SOLN
INTRAMUSCULAR | Status: DC | PRN
  Administered 2016-05-02: 50 ug via INTRAVENOUS
  Administered 2016-05-02: 25 ug via INTRAVENOUS

## 2016-05-02 MED ORDER — MAGNESIUM CITRATE PO SOLN: 1.0000 | Freq: Once | ORAL | Status: DC | PRN

## 2016-05-02 MED ORDER — ACETAMINOPHEN 650 MG RE SUPP: 650.0000 mg | Freq: Four times a day (QID) | RECTAL | Status: DC | PRN

## 2016-05-02 MED ORDER — DEXAMETHASONE SODIUM PHOSPHATE 10 MG/ML IJ SOLN
10.0000 mg | Freq: Once | INTRAMUSCULAR | Status: AC
  Administered 2016-05-03: 10 mg via INTRAVENOUS
  Filled 2016-05-02: qty 1

## 2016-05-02 MED ORDER — FERROUS SULFATE 325 (65 FE) MG PO TABS
325.0000 mg | ORAL_TABLET | Freq: Every day | ORAL | Status: DC
  Administered 2016-05-03 – 2016-05-04 (×2): 325 mg via ORAL
  Filled 2016-05-02 (×2): qty 1

## 2016-05-02 SURGICAL SUPPLY — 51 items
BIT DRILL 5/64X5 DISP (BIT) ×3 IMPLANT
BLADE SAW SAG 73X25 THK (BLADE) ×2
BLADE SAW SGTL 73X25 THK (BLADE) ×1 IMPLANT
CAPT HIP TOTAL 2 ×2 IMPLANT
CLOSURE STERI-STRIP 1/2X4 (GAUZE/BANDAGES/DRESSINGS) ×2
CLSR STERI-STRIP ANTIMIC 1/2X4 (GAUZE/BANDAGES/DRESSINGS) ×4 IMPLANT
COVER SURGICAL LIGHT HANDLE (MISCELLANEOUS) ×3 IMPLANT
DRAPE INCISE IOBAN 66X45 STRL (DRAPES) IMPLANT
DRAPE ORTHO SPLIT 77X108 STRL (DRAPES) ×6
DRAPE SURG ORHT 6 SPLT 77X108 (DRAPES) ×2 IMPLANT
DRAPE U-SHAPE 47X51 STRL (DRAPES) ×3 IMPLANT
DRSG MEPILEX BORDER 4X12 (GAUZE/BANDAGES/DRESSINGS) ×3 IMPLANT
DRSG MEPILEX BORDER 4X8 (GAUZE/BANDAGES/DRESSINGS) IMPLANT
DURAPREP 26ML APPLICATOR (WOUND CARE) ×3 IMPLANT
ELECT CAUTERY BLADE 6.4 (BLADE) ×3 IMPLANT
ELECT REM PT RETURN 9FT ADLT (ELECTROSURGICAL) ×3
ELECTRODE REM PT RTRN 9FT ADLT (ELECTROSURGICAL) ×1 IMPLANT
GLOVE BIOGEL PI ORTHO PRO SZ8 (GLOVE) ×4
GLOVE ORTHO TXT STRL SZ7.5 (GLOVE) ×3 IMPLANT
GLOVE PI ORTHO PRO STRL SZ8 (GLOVE) ×2 IMPLANT
GLOVE SURG ORTHO 8.0 STRL STRW (GLOVE) ×3 IMPLANT
GOWN STRL REUS W/ TWL XL LVL3 (GOWN DISPOSABLE) ×1 IMPLANT
GOWN STRL REUS W/TWL 2XL LVL3 (GOWN DISPOSABLE) ×3 IMPLANT
GOWN STRL REUS W/TWL XL LVL3 (GOWN DISPOSABLE) ×3
HOOD PEEL AWAY FACE SHEILD DIS (HOOD) ×6 IMPLANT
KIT BASIN OR (CUSTOM PROCEDURE TRAY) ×3 IMPLANT
KIT ROOM TURNOVER OR (KITS) ×3 IMPLANT
MANIFOLD NEPTUNE II (INSTRUMENTS) ×3 IMPLANT
NDL SAFETY ECLIPSE 18X1.5 (NEEDLE) ×1 IMPLANT
NEEDLE HYPO 18GX1.5 SHARP (NEEDLE) ×3
NS IRRIG 1000ML POUR BTL (IV SOLUTION) ×3 IMPLANT
PACK TOTAL JOINT (CUSTOM PROCEDURE TRAY) ×3 IMPLANT
PAD ARMBOARD 7.5X6 YLW CONV (MISCELLANEOUS) ×6 IMPLANT
PILLOW ABDUCTION HIP (SOFTGOODS) ×3 IMPLANT
PRESSURIZER FEMORAL UNIV (MISCELLANEOUS) IMPLANT
RETRIEVER SUT HEWSON (MISCELLANEOUS) ×3 IMPLANT
SUCTION FRAZIER HANDLE 10FR (MISCELLANEOUS) ×2
SUCTION TUBE FRAZIER 10FR DISP (MISCELLANEOUS) ×1 IMPLANT
SUT FIBERWIRE #2 38 REV NDL BL (SUTURE) ×9
SUT VIC AB 0 CT1 27 (SUTURE) ×3
SUT VIC AB 0 CT1 27XBRD ANBCTR (SUTURE) ×1 IMPLANT
SUT VIC AB 2-0 CT1 27 (SUTURE) ×3
SUT VIC AB 2-0 CT1 TAPERPNT 27 (SUTURE) ×1 IMPLANT
SUT VIC AB 3-0 SH 8-18 (SUTURE) ×3 IMPLANT
SUTURE FIBERWR#2 38 REV NDL BL (SUTURE) ×3 IMPLANT
SYR CONTROL 10ML LL (SYRINGE) ×3 IMPLANT
TOWEL OR 17X24 6PK STRL BLUE (TOWEL DISPOSABLE) ×3 IMPLANT
TOWEL OR 17X26 10 PK STRL BLUE (TOWEL DISPOSABLE) ×3 IMPLANT
TRAY CATH 16FR W/PLASTIC CATH (SET/KITS/TRAYS/PACK) IMPLANT
TRAY FOLEY CATH SILVER 14FR (SET/KITS/TRAYS/PACK) IMPLANT
TRAY FOLEY W/METER SILVER 14FR (SET/KITS/TRAYS/PACK) ×2 IMPLANT

## 2016-05-02 NOTE — Anesthesia Preprocedure Evaluation (Addendum)
Anesthesia Evaluation  Patient identified by MRN, date of birth, ID band Patient awake    Reviewed: Allergy & Precautions, NPO status , Patient's Chart, lab work & pertinent test results  Airway Mallampati: II   Neck ROM: Full    Dental  (+) Poor Dentition, Missing   Pulmonary shortness of breath,    breath sounds clear to auscultation       Cardiovascular hypertension, +CHF  + dysrhythmias Atrial Fibrillation  Rhythm:Regular Rate:Normal  - Left ventricle: The cavity size was normal. Wall thickness was   increased in a pattern of mild LVH. There was mild focal basal   hypertrophy of the septum. Systolic function was normal. The   estimated ejection fraction was in the range of 50% to 55%. Wall   motion was normal; there were no regional wall motion   abnormalities. Left ventricular diastolic function parameters   were normal. - Mitral valve: There was mild regurgitation. - Left atrium: The atrium was mildly dilated. - Right atrium: The atrium was mildly dilated. - Atrial septum: No defect or patent foramen ovale was identified. - Tricuspid valve: There was mild-moderate regurgitation. - Pulmonary arteries: PA peak pressure: 41 mm Hg (S).    Neuro/Psych    GI/Hepatic   Endo/Other  Hypothyroidism   Renal/GU Renal disease     Musculoskeletal   Abdominal   Peds  Hematology   Anesthesia Other Findings   Reproductive/Obstetrics                            Anesthesia Physical Anesthesia Plan  ASA: III  Anesthesia Plan: Spinal   Post-op Pain Management:    Induction: Intravenous  Airway Management Planned: Simple Face Mask and Natural Airway  Additional Equipment:   Intra-op Plan:   Post-operative Plan:   Informed Consent: I have reviewed the patients History and Physical, chart, labs and discussed the procedure including the risks, benefits and alternatives for the proposed  anesthesia with the patient or authorized representative who has indicated his/her understanding and acceptance.     Plan Discussed with:   Anesthesia Plan Comments:         Anesthesia Quick Evaluation

## 2016-05-02 NOTE — Op Note (Signed)
05/02/2016  3:53 PM  PATIENT:  [name] [name]   MRN: [id number]  PRE-OPERATIVE DIAGNOSIS:  Primary localized osteoarthritis of left hip  POST-OPERATIVE DIAGNOSIS:  Primary localized osteoarthritis of left hip  PROCEDURE:  Procedure(s): LEFT TOTAL HIP ARTHROPLASTY  PREOPERATIVE INDICATIONS:    KINGSLEE MAIRENA is an 76 y.o. male who has a diagnosis of Primary localized osteoarthritis of left hip and elected for surgical management after failing conservative treatment.  The risks benefits and alternatives were discussed with the patient including but not limited to the risks of nonoperative treatment, versus surgical intervention including infection, bleeding, nerve injury, periprosthetic fracture, the need for revision surgery, dislocation, leg length discrepancy, blood clots, cardiopulmonary complications, morbidity, mortality, among others, and they were willing to proceed.     OPERATIVE REPORT     SURGEON:  Marchia Bond, MD    ASSISTANT:  Joya Gaskins, OPA-C  (Present throughout the entire procedure,  necessary for completion of procedure in a timely manner, assisting with retraction, instrumentation, and closure)     ANESTHESIA:  Spinal with a foley  ESTIMATED BLOOD LOSS: 277    COMPLICATIONS:  None.     UNIQUE ASPECTS OF THE CASE:  The femoral head was extremely large, and there was significant degenerative changes on both the acetabulum and the femoral head. There was significant superior acetabular bone loss as well. I did end up putting a 6 slightly proud in order to regain length, he did not need a high offset, and ended up with a very stable construct and did not need a lipped liner.  COMPONENTS:  [employer] fit femur size 6 with a 36+5 mm head ball and a gription acetabular shell size 58 with an apex hole eliminator and neutral +4 polyethylene liner.    PROCEDURE IN DETAIL:   The patient was met in the holding area and  identified.  The appropriate hip was  identified and marked at the operative site.  The patient was then transported to the OR  and  placed under anesthesia with a spinal, and a Foley catheter was placed given his history of urinary stricture.  At that point, the patient was  placed in the lateral decubitus position with the operative side up and  secured to the operating room table and all bony prominences padded.     The operative lower extremity was prepped from the iliac crest to the distal leg.  Sterile draping was performed.  Time out was performed prior to incision.      A routine posterolateral approach was utilized via sharp dissection  carried down to the subcutaneous tissue.  Gross bleeders were Bovie coagulated.  The iliotibial band was identified and incised along the length of the skin incision.  Self-retaining retractors were  inserted.  With the hip internally rotated, the short external rotators  were identified. The piriformis and capsule was tagged with FiberWire, and the hip capsule released in a T-type fashion.  The femoral neck was exposed, and I resected the femoral neck using the appropriate jig. This was performed at approximately a thumb's breadth above the lesser trochanter.    I then exposed the deep acetabulum, cleared out any tissue including the ligamentum teres.  A wing retractor was placed.  After adequate visualization, I excised the labrum, and then sequentially reamed.  I placed the trial acetabulum, which seated nicely, and then impacted the real cup into place.  Appropriate version and inclination was confirmed clinically matching their bony anatomy, and  also with the use of the jig.  A trial polyethylene liner was placed and the wing retractor removed.    I then prepared the proximal femur using the cookie-cutter, the lateralizing reamer, and then sequentially reamed and broached.  A trial broach, neck, and head was utilized, and I reduced the hip and it was found to have excellent stability with  functional range of motion. The trial components were then removed, and the real polyethylene liner was placed.  I then impacted the real femoral prosthesis into place into the appropriate version, slightly anteverted to the normal anatomy, and I impacted the real head ball into place. The hip was then reduced and taken through functional range of motion and found to have excellent stability. Leg lengths were restored.  I then used a 2 mm drill bits to pass the FiberWire suture from the capsule and piriformis through the greater trochanter, and secured this. Excellent posterior capsular repair was achieved. I also closed the T in the capsule.  I then irrigated the hip copiously again with pulse lavage, and repaired the fascia with Vicryl, followed by Vicryl for the subcutaneous tissue, Monocryl for the skin, Steri-Strips and sterile gauze. The wounds were injected. The patient was then awakened and returned to PACU in stable and satisfactory condition. There were no complications.  Marchia Bond, MD Orthopedic Surgeon [phone]   05/02/2016 3:53 PM

## 2016-05-02 NOTE — Transfer of Care (Addendum)
Immediate Anesthesia Transfer of Care Note  Patient: [name] [name]  Procedure(s) Performed: Procedure(s): LEFT TOTAL HIP ARTHROPLASTY (Left)  Patient Location: PACU  Anesthesia Type:MAC and Spinal  Level of Consciousness: awake and alert   Airway & Oxygen Therapy: Patient Spontanous Breathing and Patient connected to face mask oxygen  Post-op Assessment: Report given to RN and Post -op Vital signs reviewed and stable  Post vital signs: Reviewed and stable after fluid bolus given and neosynephrine. BP 105/73  Last Vitals:  Vitals:   05/02/16 1108 05/02/16 1621  BP: 133/87 (!) (P) 79/63  Pulse: 90 (!) (P) 56  Resp: 20 (P) 12  Temp: 36.9 C (P) 36.3 C    Last Pain:  Vitals:   05/02/16 1621  PainSc: (P) 0-No pain      Patients Stated Pain Goal: 3 (72/82/06 0156)  Complications: No apparent anesthesia complications

## 2016-05-02 NOTE — Progress Notes (Signed)
Patient admitted from PACU. Patient aler and oriented x 4. Patient oriented to room and made comfortable. Patient denies any pain at this time. Will continue to monitor.

## 2016-05-02 NOTE — H&P (Signed)
PREOPERATIVE H&P  Chief Complaint: OA LEFT HIP  HPI: [name] [name] is a 76 y.o. male who presents for preoperative history and physical with a diagnosis of OA LEFT HIP. Symptoms are rated as moderate to severe, and have been worsening.  This is significantly impairing activities of daily living.  He has elected for surgical management.   He has failed injections, activity modification, anti-inflammatories, and assistive devices. He has required long-term narcotic medications, as well as using a cane, and has risk factors including the fact that he cannot take anti-inflammatories because he is on blood thinners for atrial fibrillation.  Preoperative X-rays demonstrate end stage degenerative changes with osteophyte formation, loss of joint space, subchondral sclerosis.   Past Medical History:  Diagnosis Date  . Cardiomyopathy [facility])    h/o felt 2nd atrial fib, EF normal by echo 2013  . CHF (congestive heart failure) ([place])   . Chronic anticoagulation   . Chronic atrial fibrillation ([place])   . Chronic kidney disease (CKD), stage IV (severe) ([place])   . Dysrhythmia   . Hypothyroidism   . Pneumonia   . Pre-diabetes   . Prostate cancer ([place])   . Shortness of breath   . Systemic hypertension    Past Surgical History:  Procedure Laterality Date  . CARDIOVERSION    . CATARACT EXTRACTION W/PHACO  04/17/2011   Procedure: CATARACT EXTRACTION PHACO AND INTRAOCULAR LENS PLACEMENT (IOC);  Surgeon: [name], MD;  Location: [facility];  Service: Ophthalmology;  Laterality: Left;  CDE:14.58  . CATARACT EXTRACTION W/PHACO  05/22/2011   Procedure: CATARACT EXTRACTION PHACO AND INTRAOCULAR LENS PLACEMENT (IOC);  Surgeon: [name], MD;  Location: [facility] OR[facility];  Service: Ophthalmology;  Laterality: Right;  CDE 13.02  . COLONOSCOPY  01/17/2011   Procedure: COLONOSCOPY;  Surgeon: Jamesetta So;  Location: [facility];  Service: Gastroenterology;  Laterality: N/A;  . COLONOSCOPY N/A 02/24/2016   Procedure:  COLONOSCOPY;  Surgeon: Rogene Houston, MD;  Location: [facility];  Service: Endoscopy;  Laterality: N/A;  12:50  . INGUINAL HERNIA REPAIR Right 09/27/2015   Procedure: RIGHT INGUINAL HERNIORRHAPY WITH MESH SMALL BOWEL RESECTION;  Surgeon: Aviva Signs, MD;  Location: [facility];  Service: General;  Laterality: Right;  . NM MYOCAR [address]  02/2009   dipyridamole; mild perfusion defect due to infarct/scar with mild perinfarct ischemia is apical septal, apical basal inferoseptal, basal inferior, mid inferoseptal mid inferior & apical inferior; EKG negative for ischemia; low risk scan  . PROSTATE SURGERY     7 years ago [facility] Dr Javaid-prostatectomy  . PROSTATECTOMY    . Sleep Study  05/2011   increased upper airway resistance syndrome with mild sleep apnea during REM  . TRANSTHORACIC ECHOCARDIOGRAM  04/2011   EF=50-55%; RV mildly dilated; LA mild-mod dilated; mild mitral valve annular calcif, mild MR; mod TR, RVSP elevated 30-55mmHg, mild pulm HTN; AV mildly sclerotic, mild calcif of AV leaflets; trace pulm valve regurg    Social History   Social History  . Marital status: Single    Spouse name: N/A  . Number of children: 2  . Years of education: N/A   Social History Main Topics  . Smoking status: Never Smoker  . Smokeless tobacco: Never Used  . Alcohol use No  . Drug use: No  . Sexual activity: Yes    Birth control/ protection: None   Other Topics Concern  . None   Social History Narrative  . None   Family History  Problem  Relation Age of Onset  . Diabetes Mother   . Prostate cancer Father   . Anesthesia problems Neg Hx   . Hypotension Neg Hx   . Malignant hyperthermia Neg Hx   . Pseudochol deficiency Neg Hx    Allergies  Allergen Reactions  . No Known Allergies    Prior to Admission medications   Medication Sig Start Date End Date Taking? Authorizing Provider  Calcium Carb-Cholecalciferol (CALCIUM 600+D3) 600-800 MG-UNIT TABS Take 1 tablet by mouth 2 (two) times  daily.   Yes Historical Provider, MD  Cholecalciferol (VITAMIN D3) 5000 units CAPS Take 1 capsule (5,000 Units total) by mouth daily. 12/24/15  Yes [name], MD  ferrous sulfate (QC FERROUS SULFATE) 325 (65 FE) MG tablet Take 325 mg by mouth daily with breakfast.   Yes Historical Provider, MD  HYDROcodone-acetaminophen (NORCO/VICODIN) 5-325 MG tablet Take 1 tablet by mouth every 6 (six) hours as needed for moderate pain.   Yes Historical Provider, MD  levothyroxine (SYNTHROID, LEVOTHROID) 50 MCG tablet TAKE ONE TABLET BY MOUTH IN THE MORNING 03/16/16  Yes [name], MD  metoprolol (LOPRESSOR) 50 MG tablet Take 1 tablet (50 mg total) by mouth 2 (two) times daily. 03/31/16 03/26/17 Yes Mihai Croitoru, MD  Rivaroxaban (XARELTO) 15 MG TABS tablet Take 1 tablet (15 mg total) by mouth daily with supper. 11/23/15  Yes Mihai Croitoru, MD  sodium bicarbonate 650 MG tablet Take 650 mg by mouth 2 (two) times daily.   Yes Historical Provider, MD     Positive ROS: All other systems have been reviewed and were otherwise negative with the exception of those mentioned in the HPI and as above.  Physical Exam: General: Alert, no acute distress Cardiovascular: No pedal edema Respiratory: No cyanosis, no use of accessory musculature GI: No organomegaly, abdomen is soft and non-tender Skin: No lesions in the area of chief complaint Neurologic: Sensation intact distally Psychiatric: Patient is competent for consent with normal mood and affect Lymphatic: No axillary or cervical lymphadenopathy  MUSCULOSKELETAL: Left hip has almost no motion. It is almost completely ankylosed. Flexion is to 70 at most, and no internal or external rotation.  Assessment: OA LEFT HIP   Plan: Plan for Procedure(s): LEFT TOTAL HIP ARTHROPLASTY  The risks benefits and alternatives were discussed with the patient including but not limited to the risks of nonoperative treatment, versus surgical intervention  including infection, bleeding, nerve injury, periprosthetic fracture, the need for revision surgery, dislocation, leg length discrepancy, blood clots, cardiopulmonary complications, morbidity, mortality, among others, and they were willing to proceed.     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  Johnny Bridge, MD Cell [phone]   05/02/2016 1:00 PM

## 2016-05-03 ENCOUNTER — Encounter (HOSPITAL_COMMUNITY): Payer: Self-pay | Admitting: Orthopedic Surgery

## 2016-05-03 LAB — CBC
HCT: 38.5 % — ABNORMAL LOW (ref 39.0–52.0)
Hemoglobin: 13.1 g/dL (ref 13.0–17.0)
MCH: 32.9 pg (ref 26.0–34.0)
MCHC: 34 g/dL (ref 30.0–36.0)
MCV: 96.7 fL (ref 78.0–100.0)
Platelets: 214 10*3/uL (ref 150–400)
RBC: 3.98 MIL/uL — ABNORMAL LOW (ref 4.22–5.81)
RDW: 13.9 % (ref 11.5–15.5)
WBC: 8.9 10*3/uL (ref 4.0–10.5)

## 2016-05-03 LAB — BASIC METABOLIC PANEL
ANION GAP: 6 (ref 5–15)
BUN: 18 mg/dL (ref 6–20)
CALCIUM: 8.3 mg/dL — AB (ref 8.9–10.3)
CO2: 26 mmol/L (ref 22–32)
Chloride: 105 mmol/L (ref 101–111)
Creatinine, Ser: 1.92 mg/dL — ABNORMAL HIGH (ref 0.61–1.24)
GFR calc Af Amer: 37 mL/min — ABNORMAL LOW (ref >60)
GFR calc non Af Amer: 32 mL/min — ABNORMAL LOW (ref >60)
GLUCOSE: 157 mg/dL — AB (ref 65–99)
Potassium: 4.5 mmol/L (ref 3.5–5.1)
Sodium: 137 mmol/L (ref 135–145)

## 2016-05-03 NOTE — Progress Notes (Signed)
     Subjective:  Patient reports pain as mild.  Overall doing well.    Objective:   VITALS:   Vitals:   05/02/16 1940 05/02/16 2000 05/03/16 0044 05/03/16 0430  BP: 129/87 (!) 143/86 140/68 (!) 148/82  Pulse: (!) 53 71 67 81  Resp: 14 14 14 14   Temp:  97.8 F (36.6 C) 97.9 F (36.6 C) 98.9 F (37.2 C)  TempSrc:  Oral Oral Oral  SpO2: 100% 98% 98% 100%    Neurologically intact Sensation intact distally Dorsiflexion/Plantar flexion intact Incision: scant drainage   Lab Results  Component Value Date   WBC 7.7 04/19/2016   HGB 13.5 04/19/2016   HCT 40.7 04/19/2016   MCV 96.4 04/19/2016   PLT 223 04/19/2016   BMET    Component Value Date/Time   NA 142 04/19/2016 1414   NA 143 06/24/2015 0911   K 4.3 04/19/2016 1414   CL 107 04/19/2016 1414   CO2 23 04/19/2016 1414   GLUCOSE 96 04/19/2016 1414   BUN 20 04/19/2016 1414   BUN 27 06/24/2015 0911   CREATININE 1.96 (H) 04/19/2016 1414   CREATININE 1.86 (H) 10/15/2015 1251   CALCIUM 9.2 04/19/2016 1414   GFRNONAA 31 (L) 04/19/2016 1414   GFRNONAA 35 (L) 10/15/2015 1251   GFRAA 36 (L) 04/19/2016 1414   GFRAA 40 (L) 10/15/2015 1251     Assessment/Plan: 1 Day Post-Op   Principal Problem:   Primary localized osteoarthritis of left hip   Advance diet Up with therapy Discharge home with home health thurs or Friday discharge planned.   Malahki Gasaway P 05/03/2016, 8:31 AM   Marchia Bond, MD Cell [phone]

## 2016-05-03 NOTE — Discharge Instructions (Signed)
INSTRUCTIONS AFTER JOINT REPLACEMENT  ° °o Remove items at home which could result in a fall. This includes throw rugs or furniture in walking pathways °o ICE to the affected joint every three hours while awake for 30 minutes at a time, for at least the first 3-5 days, and then as needed for pain and swelling.  Continue to use ice for pain and swelling. You may notice swelling that will progress down to the foot and ankle.  This is normal after surgery.  Elevate your leg when you are not up walking on it.   °o Continue to use the breathing machine you got in the hospital (incentive spirometer) which will help keep your temperature down.  It is common for your temperature to cycle up and down following surgery, especially at night when you are not up moving around and exerting yourself.  The breathing machine keeps your lungs expanded and your temperature down. ° ° °DIET:  As you were doing prior to hospitalization, we recommend a well-balanced diet. ° °DRESSING / WOUND CARE / SHOWERING ° °You may change your dressing 3-5 days after surgery.  Then change the dressing every day with sterile gauze.  Please use good hand washing techniques before changing the dressing.  Do not use any lotions or creams on the incision until instructed by your surgeon. ° °ACTIVITY ° °o Increase activity slowly as tolerated, but follow the weight bearing instructions below.   °o No driving for 6 weeks or until further direction given by your physician.  You cannot drive while taking narcotics.  °o No lifting or carrying greater than 10 lbs. until further directed by your surgeon. °o Avoid periods of inactivity such as sitting longer than an hour when not asleep. This helps prevent blood clots.  °o You may return to work once you are authorized by your doctor.  ° ° ° °WEIGHT BEARING  ° °Weight bearing as tolerated with assist device (walker, cane, etc) as directed, use it as long as suggested by your surgeon or therapist, typically at  least 4-6 weeks. ° ° °EXERCISES ° °Results after joint replacement surgery are often greatly improved when you follow the exercise, range of motion and muscle strengthening exercises prescribed by your doctor. Safety measures are also important to protect the joint from further injury. Any time any of these exercises cause you to have increased pain or swelling, decrease what you are doing until you are comfortable again and then slowly increase them. If you have problems or questions, call your caregiver or physical therapist for advice.  ° °Rehabilitation is important following a joint replacement. After just a few days of immobilization, the muscles of the leg can become weakened and shrink (atrophy).  These exercises are designed to build up the tone and strength of the thigh and leg muscles and to improve motion. Often times heat used for twenty to thirty minutes before working out will loosen up your tissues and help with improving the range of motion but do not use heat for the first two weeks following surgery (sometimes heat can increase post-operative swelling).  ° °These exercises can be done on a training (exercise) mat, on the floor, on a table or on a bed. Use whatever works the best and is most comfortable for you.    Use music or television while you are exercising so that the exercises are a pleasant break in your day. This will make your life better with the exercises acting as a break   in your routine that you can look forward to.   Perform all exercises about fifteen times, three times per day or as directed.  You should exercise both the operative leg and the other leg as well. ° °Exercises include: °  °• Quad Sets - Tighten up the muscle on the front of the thigh (Quad) and hold for 5-10 seconds.   °• Straight Leg Raises - With your knee straight (if you were given a brace, keep it on), lift the leg to 60 degrees, hold for 3 seconds, and slowly lower the leg.  Perform this exercise against  resistance later as your leg gets stronger.  °• Leg Slides: Lying on your back, slowly slide your foot toward your buttocks, bending your knee up off the floor (only go as far as is comfortable). Then slowly slide your foot back down until your leg is flat on the floor again.  °• Angel Wings: Lying on your back spread your legs to the side as far apart as you can without causing discomfort.  °• Hamstring Strength:  Lying on your back, push your heel against the floor with your leg straight by tightening up the muscles of your buttocks.  Repeat, but this time bend your knee to a comfortable angle, and push your heel against the floor.  You may put a pillow under the heel to make it more comfortable if necessary.  ° °A rehabilitation program following joint replacement surgery can speed recovery and prevent re-injury in the future due to weakened muscles. Contact your doctor or a physical therapist for more information on knee rehabilitation.  ° ° °CONSTIPATION ° °Constipation is defined medically as fewer than three stools per week and severe constipation as less than one stool per week.  Even if you have a regular bowel pattern at home, your normal regimen is likely to be disrupted due to multiple reasons following surgery.  Combination of anesthesia, postoperative narcotics, change in appetite and fluid intake all can affect your bowels.  ° °YOU MUST use at least one of the following options; they are listed in order of increasing strength to get the job done.  They are all available over the counter, and you may need to use some, POSSIBLY even all of these options:   ° °Drink plenty of fluids (prune juice may be helpful) and high fiber foods °Colace 100 mg by mouth twice a day  °Senokot for constipation as directed and as needed Dulcolax (bisacodyl), take with full glass of water  °Miralax (polyethylene glycol) once or twice a day as needed. ° °If you have tried all these things and are unable to have a bowel  movement in the first 3-4 days after surgery call either your surgeon or your primary doctor.   ° °If you experience loose stools or diarrhea, hold the medications until you stool forms back up.  If your symptoms do not get better within 1 week or if they get worse, check with your doctor.  If you experience "the worst abdominal pain ever" or develop nausea or vomiting, please contact the office immediately for further recommendations for treatment. ° ° °ITCHING:  If you experience itching with your medications, try taking only a single pain pill, or even half a pain pill at a time.  You can also use Benadryl over the counter for itching or also to help with sleep.  ° °TED HOSE STOCKINGS:  Use stockings on both legs until for at least 2 weeks or as   directed by physician office. They may be removed at night for sleeping.  MEDICATIONS:  See your medication summary on the After Visit Summary that nursing will review with you.  You may have some home medications which will be placed on hold until you complete the course of blood thinner medication.  It is important for you to complete the blood thinner medication as prescribed.  PRECAUTIONS:  If you experience chest pain or shortness of breath - call 911 immediately for transfer to the hospital emergency department.   If you develop a fever greater that 101 F, purulent drainage from wound, increased redness or drainage from wound, foul odor from the wound/dressing, or calf pain - CONTACT YOUR SURGEON.                                                   FOLLOW-UP APPOINTMENTS:  If you do not already have a post-op appointment, please call the office for an appointment to be seen by your surgeon.  Guidelines for how soon to be seen are listed in your After Visit Summary, but are typically between 1-4 weeks after surgery.  OTHER INSTRUCTIONS:   Knee Replacement:  Do not place pillow under knee, focus on keeping the knee straight while resting. CPM  instructions: 0-90 degrees, 2 hours in the morning, 2 hours in the afternoon, and 2 hours in the evening. Place foam block, curve side up under heel at all times except when in CPM or when walking.  DO NOT modify, tear, cut, or change the foam block in any way.  MAKE SURE YOU:   Understand these instructions.   Get help right away if you are not doing well or get worse.    Thank you for letting us be a part of your medical care team.  It is a privilege we respect greatly.  We hope these instructions will help you stay on track for a fast and full recovery!   ----------------------------------------------------------------------------------------------------------------------------------------------------- Information on my medicine - XARELTO (Rivaroxaban)  This medication education was reviewed with me or my healthcare representative as part of my discharge preparation.   Why was Xarelto prescribed for you? Xarelto was prescribed for you to reduce the risk of a blood clot forming that can cause a stroke if you have a medical condition called atrial fibrillation (a type of irregular heartbeat).  What do you need to know about xarelto ? Take your Xarelto ONCE DAILY at the same time every day with your evening meal. If you have difficulty swallowing the tablet whole, you may crush it and mix in applesauce just prior to taking your dose.  Take Xarelto exactly as prescribed by your doctor and DO NOT stop taking Xarelto without talking to the doctor who prescribed the medication.  Stopping without other stroke prevention medication to take the place of Xarelto may increase your risk of developing a clot that causes a stroke.  Refill your prescription before you run out.  After discharge, you should have regular check-up appointments with your healthcare provider that is prescribing your Xarelto.  In the future your dose may need to be changed if your kidney function or weight changes by a  significant amount.  What do you do if you miss a dose? If you are taking Xarelto ONCE DAILY and you miss a dose, take it as soon  as you remember on the same day then continue your regularly scheduled once daily regimen the next day. Do not take two doses of Xarelto at the same time or on the same day.   Important Safety Information A possible side effect of Xarelto is bleeding. You should call your healthcare provider right away if you experience any of the following: ? Bleeding from an injury or your nose that does not stop. ? Unusual colored urine (red or dark brown) or unusual colored stools (red or black). ? Unusual bruising for unknown reasons. ? A serious fall or if you hit your head (even if there is no bleeding).  Some medicines may interact with Xarelto and might increase your risk of bleeding while on Xarelto. To help avoid this, consult your healthcare provider or pharmacist prior to using any new prescription or non-prescription medications, including herbals, vitamins, non-steroidal anti-inflammatory drugs (NSAIDs) and supplements.  This website has more information on Xarelto: [email or web address]

## 2016-05-03 NOTE — Progress Notes (Signed)
Physical Therapy Treatment Patient Details Name: [name] [name] MRN: [id number] DOB: July 28, 1940 Today's Date: 05/03/2016    History of Present Illness 76 y.o. male admitted to [facility] on 05/02/16 for elective L THA, posterior approach.  Pt with significant PMHx of SOB, CKD IV, chronic A-fib, CHF, cardiomyopathy, and cardioversion.      PT Comments    Pt is making progress towards his goals. Currently, he is min guard for transfers and minAx1 for ambulation of 150 ft with RW. Pt requires skilled PT to progress gait and stair training and increase LE strength and ROM to be able to safely navigate his home environment.    Follow Up Recommendations  Home health PT;Supervision for mobility/OOB     Equipment Recommendations  Rolling walker with 5" wheels;3in1 (PT)    Recommendations for Other Services       Precautions / Restrictions Precautions Precautions: Posterior Hip Precaution Booklet Issued: Yes (comment) Precaution Comments: precaution and exercise handout given and reviewed Restrictions Weight Bearing Restrictions: Yes LLE Weight Bearing: Weight bearing as tolerated    Mobility  Bed Mobility Overal bed mobility: Needs Assistance Bed Mobility: Supine to Sit     Supine to sit: Min assist;HOB elevated     General bed mobility comments: pt in recliner on entry  Transfers Overall transfer level: Needs assistance Equipment used: Rolling walker (2 wheeled) Transfers: Sit to/from Stand Sit to Stand: Min guard         General transfer comment: Min guard assist for safety, verbal cues for push up from recliner arms, min guard for balance on upright  Ambulation/Gait Ambulation/Gait assistance: Min guard Ambulation Distance (Feet): 150 Feet Assistive device: Rolling walker (2 wheeled) Gait Pattern/deviations: Step-to pattern;Antalgic;Step-through pattern Gait velocity: slowed Gait velocity interpretation: Below normal speed for age/gender General Gait Details: Pt  continues to have antalgic gait pattern but was able to progress to step through pattern of gait, vc for upright posture and not looking down at his feet, minA for steadying at end of ambulation. Pt with DOE 2/4 handheld O2 monitor would not read due to cold hands but dyspnea resolved within 2 minutes         Balance Overall balance assessment: Needs assistance Sitting-balance support: Feet supported;No upper extremity supported Sitting balance-Leahy Scale: Good     Standing balance support: Bilateral upper extremity supported Standing balance-Leahy Scale: Fair Standing balance comment: pt came to upright and steadied himself before reaching for RW                            Cognition Arousal/Alertness: Awake/alert Behavior During Therapy: [facility] for tasks assessed/performed Overall Cognitive Status: Within Functional Limits for tasks assessed                                        Exercises Total Joint Exercises Ankle Circles/Pumps: AROM;Both;20 reps Quad Sets: AROM;Left;10 reps;Seated Heel Slides: Left;10 reps;AROM Hip ABduction/ADduction: Left;10 reps;AROM Long Arc Quad: AROM;Both;10 reps        Pertinent Vitals/Pain Pain Assessment: 0-10 Pain Score: 3  Pain Location: left hip and thigh Pain Descriptors / Indicators: Aching;Burning;Grimacing;Guarding Pain Intervention(s): Monitored during session;Ice applied    Home Living Family/patient expects to be discharged to:: Private residence Living Arrangements: Children Available Help at Discharge: Family;Available 24 hours/day Type of Home: Mobile home Home Access: Stairs to enter Entrance Stairs-Rails: Can reach  both Home Layout: One level Home Equipment: Cane - single point      Prior Function Level of Independence: Independent          PT Goals (current goals can now be found in the care plan section) Acute Rehab PT Goals Patient Stated Goal: to get home and walk without pain PT Goal  Formulation: With patient/family Time For Goal Achievement: 05/10/16 Potential to Achieve Goals: Good Progress towards PT goals: Progressing toward goals    Frequency    7X/week      PT Plan Current plan remains appropriate       End of Session Equipment Utilized During Treatment: Gait belt Activity Tolerance: Patient limited by fatigue Patient left: in chair;with call bell/phone within reach Nurse Communication: Mobility status PT Visit Diagnosis: Muscle weakness (generalized) (M62.81);Difficulty in walking, not elsewhere classified (R26.2);Pain Pain - Right/Left: Left Pain - part of body: Hip     Time: 5520-8022 PT Time Calculation (min) (ACUTE ONLY): 26 min  Charges:  $Gait Training: 8-22 mins $Therapeutic Exercise: 8-22 mins $Therapeutic Activity: 8-22 mins                    G Codes:       Jahlisa Rossitto B. Migdalia Dk PT, DPT Acute Rehabilitation  [phone] Pager [phone]     [place] 05/03/2016, 4:47 PM

## 2016-05-03 NOTE — Anesthesia Postprocedure Evaluation (Addendum)
Anesthesia Post Note  Patient: [name] [name]  Procedure(s) Performed: Procedure(s) (LRB): LEFT TOTAL HIP ARTHROPLASTY (Left)  Patient location during evaluation: PACU Anesthesia Type: Spinal Level of consciousness: oriented and awake and alert Pain management: pain level controlled Vital Signs Assessment: post-procedure vital signs reviewed and stable Respiratory status: spontaneous breathing, respiratory function stable and patient connected to nasal cannula oxygen Cardiovascular status: blood pressure returned to baseline and stable Postop Assessment: no headache and no backache Anesthetic complications: no       Last Vitals:  Vitals:   05/03/16 0044 05/03/16 0430  BP: 140/68 (!) 148/82  Pulse: 67 81  Resp: 14 14  Temp: 36.6 C 37.2 C    Last Pain:  Vitals:   05/03/16 0610  TempSrc:   PainSc: 5                  Shamar Kracke,JAMES TERRILL

## 2016-05-03 NOTE — Evaluation (Signed)
Occupational Therapy Evaluation Patient Details Name: [name] [name] MRN: [id number] DOB: 1940/01/11 Today's Date: 05/03/2016    History of Present Illness 76 y.o. male admitted to [facility] on 05/02/16 for elective L THA, posterior approach.  Pt with significant PMHx of SOB, CKD IV, chronic A-fib, CHF, cardiomyopathy, and cardioversion.     Clinical Impression   PTA, pt was independent with ADL and functional mobility. He lives with his son and daughter-in-law who are able to provide 24 hour assistance post-acute D/C. He currently requires mod assist for LB ADL with adaptive equipment while adhering to posterior hip precautions and mod assist for toilet transfers. Educated pt on posterior hip precautions and strategies to adhere to these during ADL and he verbalizes understanding Pt would benefit from continued OT services while admitted to improve safety and independence with ADL prior to returning home with assistance from family. Will continue to follow acutely with focus on shower transfers and LB ADL with AE.    Follow Up Recommendations  No OT follow up;Supervision/Assistance - 24 hour    Equipment Recommendations  Other (comment) Therapist, nutritional aide)    Recommendations for Other Services       Precautions / Restrictions Precautions Precautions: Posterior Hip Precaution Booklet Issued: Yes (comment) Precaution Comments: Reviewed handout provided by PT Restrictions Weight Bearing Restrictions: Yes LLE Weight Bearing: Weight bearing as tolerated      Mobility Bed Mobility Overal bed mobility: Needs Assistance Bed Mobility: Supine to Sit     Supine to sit: Min assist;HOB elevated     General bed mobility comments: OOB in chair on arrival  Transfers Overall transfer level: Needs assistance Equipment used: Rolling walker (2 wheeled) Transfers: Sit to/from Stand Sit to Stand: Mod assist         General transfer comment: Mod assist to rise to stand. Pt with posterior leaning  when standing and requiring multiple attempts to power up. Pt reports fatigue from just finishing PT session.    Balance Overall balance assessment: Needs assistance Sitting-balance support: Feet supported;No upper extremity supported Sitting balance-Leahy Scale: Good     Standing balance support: Bilateral upper extremity supported;No upper extremity supported Standing balance-Leahy Scale: Poor Standing balance comment: pt came to upright and steadied himself before reaching for RW                           ADL either performed or assessed with clinical judgement   ADL Overall ADL's : Needs assistance/impaired Eating/Feeding: Set up;Sitting   Grooming: Set up;Sitting   Upper Body Bathing: Set up;Sitting   Lower Body Bathing: Moderate assistance;Sit to/from stand;With adaptive equipment   Upper Body Dressing : Set up;Sitting   Lower Body Dressing: Moderate assistance;Sit to/from stand;With adaptive equipment   Toilet Transfer: Moderate assistance;Stand-pivot;BSC;RW Toilet Transfer Details (indicate cue type and reason): Pt unsteady in standing with knee buckling and LOB requiring mod assist to maintain upright. Toileting- Clothing Manipulation and Hygiene: Moderate assistance;Sit to/from stand Toileting - Clothing Manipulation Details (indicate cue type and reason): Mod assist sit<>stand     Functional mobility during ADLs: Moderate assistance;Rolling walker General ADL Comments: Pt unsteady in standing with posterior leaning and knee buckling. Pt willing and motivated to participate with OT. Educated on use of AE for LB dressing and pt reports that he has a Secondary school teacher but not a sock aide at home.     Vision Patient Visual Report: No change from baseline Vision Assessment?: No apparent visual deficits  Perception     Praxis      Pertinent Vitals/Pain Pain Assessment: Faces Pain Score: 3  Faces Pain Scale: Hurts little more Pain Location: left hip and  thigh Pain Descriptors / Indicators: Aching;Burning;Grimacing;Guarding Pain Intervention(s): Limited activity within patient's tolerance;Monitored during session;Repositioned     Hand Dominance Left   Extremity/Trunk Assessment Upper Extremity Assessment Upper Extremity Assessment: Overall WFL for tasks assessed   Lower Extremity Assessment Lower Extremity Assessment: LLE deficits/detail LLE Deficits / Details: Decreased strength and ROM as expected post-operatively. LLE Sensation:  (intact to LT)   Cervical / Trunk Assessment Cervical / Trunk Assessment: Normal   Communication Communication Communication: No difficulties   Cognition Arousal/Alertness: Awake/alert Behavior During Therapy: WFL for tasks assessed/performed Overall Cognitive Status: Within Functional Limits for tasks assessed                                     General Comments       Exercises Exercises: Total Joint Total Joint Exercises Ankle Circles/Pumps: AROM;Both;20 reps Quad Sets: AROM;Left;10 reps;Seated Heel Slides: Left;10 reps;AROM Hip ABduction/ADduction: Left;10 reps;AROM Long Arc Quad: AROM;Both;10 reps   Shoulder Instructions      Home Living Family/patient expects to be discharged to:: Private residence Living Arrangements: Children Available Help at Discharge: Family;Available 24 hours/day Type of Home: Mobile home Home Access: Stairs to enter Entrance Stairs-Number of Steps: 4 Entrance Stairs-Rails: Can reach both Home Layout: One level     Bathroom Shower/Tub: Teacher, early years/pre: Standard     Home Equipment: Cane - single point          Prior Functioning/Environment Level of Independence: Independent                 OT Problem List: Decreased strength;Decreased range of motion;Decreased activity tolerance;Impaired balance (sitting and/or standing);Decreased safety awareness;Decreased knowledge of use of DME or AE;Decreased knowledge  of precautions;Pain      OT Treatment/Interventions: Self-care/ADL training;Therapeutic exercise;Energy conservation;DME and/or AE instruction;Therapeutic activities;Patient/family education;Balance training    OT Goals(Current goals can be found in the care plan section) Acute Rehab OT Goals Patient Stated Goal: to get home and walk without pain OT Goal Formulation: With patient Time For Goal Achievement: 05/17/16 Potential to Achieve Goals: Good ADL Goals Pt Will Perform Grooming: with modified independence;standing Pt Will Perform Lower Body Bathing: with modified independence;with adaptive equipment;sit to/from stand Pt Will Perform Lower Body Dressing: with modified independence;with adaptive equipment;sit to/from stand Pt Will Transfer to Toilet: with supervision;ambulating;bedside commode (BSC over toilet) Pt Will Perform Toileting - Clothing Manipulation and hygiene: with modified independence;sit to/from stand Pt Will Perform Tub/Shower Transfer: with min guard assist;Shower transfer;3 in 1;rolling walker Additional ADL Goal #1: Pt will adhere to posterior hip precautions during morning ADL routine independently.  OT Frequency: Min 2X/week   Barriers to D/C:            Co-evaluation              End of Session Equipment Utilized During Treatment: Rolling walker  Activity Tolerance: Patient tolerated treatment well Patient left: in chair;with call bell/phone within reach  OT Visit Diagnosis: Unsteadiness on feet (R26.81);Pain Pain - Right/Left: Left Pain - part of body: Hip                Time: 9509-3267 OT Time Calculation (min): 18 min Charges:  OT General Charges $OT Visit: 1 Procedure OT  Evaluation $OT Eval Moderate Complexity: 1 Procedure G-Codes:     Norman Herrlich, MS OTR/L  Pager: [place] 05/03/2016, 5:35 PM

## 2016-05-03 NOTE — Evaluation (Signed)
Physical Therapy Evaluation Patient Details Name: [name] [name] MRN: [id number] DOB: [birth year] Today's Date: 05/03/2016   History of Present Illness  76 y.o. male admitted to [facility] on 05/02/16 for elective L THA, posterior approach.  Pt with significant PMHx of SOB, CKD IV, chronic A-fib, CHF, cardiomyopathy, and cardioversion.    Clinical Impression  Pt is POD #1 and is moving well, min guard assist this AM with short distance gait in room.  Education re: hip  Precautions provided and HEP program initiated.  We plan to walk into the hallway this PM.   PT to follow acutely for deficits listed below.       Follow Up Recommendations Home health PT;Supervision for mobility/OOB    Equipment Recommendations  Rolling walker with 5" wheels;3in1 (PT)    Recommendations for Other Services   NA    Precautions / Restrictions Precautions Precautions: Posterior Hip Precaution Booklet Issued: Yes (comment) Precaution Comments: precaution and exercise handout given and reviewed Restrictions LLE Weight Bearing: Weight bearing as tolerated      Mobility  Bed Mobility Overal bed mobility: Needs Assistance Bed Mobility: Supine to Sit     Supine to sit: Min assist;HOB elevated     General bed mobility comments: Min assist to help progress left leg over EOB.  Pt using bedrail for leverage and HOB minimally elevated.   Transfers Overall transfer level: Needs assistance Equipment used: Rolling walker (2 wheeled) Transfers: Sit to/from Stand Sit to Stand: Min guard         General transfer comment: Min guard assist for safety, verbal cues for safe hand placement with transitions, min guard assist needed for balance.   Ambulation/Gait Ambulation/Gait assistance: Min guard Ambulation Distance (Feet): 8 Feet Assistive device: Rolling walker (2 wheeled) Gait Pattern/deviations: Step-to pattern;Antalgic     General Gait Details: Pt with moderately antalgic gait pattern, verbal cues for  safe LE sequencing, min guard assist for safety and balance during short distance gait to chair.  Increased DOE with mobility.  DOE 2/4 and HR 130.  O2 sats WNL on RA.          Balance Overall balance assessment: Needs assistance Sitting-balance support: Feet supported;No upper extremity supported Sitting balance-Leahy Scale: Good     Standing balance support: Bilateral upper extremity supported Standing balance-Leahy Scale: Poor                               Pertinent Vitals/Pain Pain Assessment: 0-10 Pain Score: 6  Pain Location: left hip and thigh Pain Descriptors / Indicators: Aching;Burning;Grimacing;Guarding Pain Intervention(s): Limited activity within patient's tolerance;Monitored during session;Repositioned;Ice applied    Home Living Family/patient expects to be discharged to:: Private residence Living Arrangements: Children Available Help at Discharge: Family;Available 24 hours/day Type of Home: Mobile home Home Access: Stairs to enter Entrance Stairs-Rails: Can reach both Entrance Stairs-Number of Steps: 4 Home Layout: One level Home Equipment: Cane - single point      Prior Function Level of Independence: Independent               Hand Dominance   Dominant Hand: Left    Extremity/Trunk Assessment   Upper Extremity Assessment Upper Extremity Assessment: Defer to OT evaluation    Lower Extremity Assessment Lower Extremity Assessment: LLE deficits/detail LLE Deficits / Details: left leg with normal post op pain and weakness, ankle at least 3/5, knee 3/5, hip 2/5 LLE Sensation:  (intact to LT)  Cervical / Trunk Assessment Cervical / Trunk Assessment: Normal  Communication   Communication: No difficulties  Cognition Arousal/Alertness: Awake/alert Behavior During Therapy: WFL for tasks assessed/performed Overall Cognitive Status: Within Functional Limits for tasks assessed                                            Exercises Total Joint Exercises Ankle Circles/Pumps: AROM;Both;20 reps Quad Sets: AROM;Left;10 reps Heel Slides: AAROM;Left;10 reps Hip ABduction/ADduction: AAROM;Left;10 reps Long Arc Quad: AROM;Both;10 reps   Assessment/Plan    PT Assessment Patient needs continued PT services  PT Problem List Decreased strength;Decreased range of motion;Decreased activity tolerance;Decreased balance;Decreased mobility;Decreased knowledge of use of DME;Decreased knowledge of precautions;Cardiopulmonary status limiting activity;Pain       PT Treatment Interventions DME instruction;Gait training;Stair training;Functional mobility training;Therapeutic activities;Therapeutic exercise;Balance training;Patient/family education;Modalities;Manual techniques    PT Goals (Current goals can be found in the Care Plan section)  Acute Rehab PT Goals Patient Stated Goal: to get home and walk without pain PT Goal Formulation: With patient/family Time For Goal Achievement: 05/10/16 Potential to Achieve Goals: Good    Frequency 7X/week           End of Session Equipment Utilized During Treatment: Gait belt Activity Tolerance: Patient limited by pain Patient left: in chair;with call bell/phone within reach;with family/visitor present   PT Visit Diagnosis: Muscle weakness (generalized) (M62.81);Difficulty in walking, not elsewhere classified (R26.2);Pain Pain - Right/Left: Left Pain - part of body: Hip    Time: 3151-7616 PT Time Calculation (min) (ACUTE ONLY): 24 min   Charges:   PT Evaluation $PT Eval Moderate Complexity: 1 Procedure PT Treatments $Therapeutic Activity: 8-22 mins  Tabari Volkert B. [place], [place], DPT [phone]    05/03/2016, 4:11 PM

## 2016-05-04 LAB — BASIC METABOLIC PANEL
Anion gap: 9 (ref 5–15)
BUN: 26 mg/dL — AB (ref 6–20)
CHLORIDE: 100 mmol/L — AB (ref 101–111)
CO2: 26 mmol/L (ref 22–32)
Calcium: 8.2 mg/dL — ABNORMAL LOW (ref 8.9–10.3)
Creatinine, Ser: 2.45 mg/dL — ABNORMAL HIGH (ref 0.61–1.24)
GFR calc Af Amer: 28 mL/min — ABNORMAL LOW (ref >60)
GFR calc non Af Amer: 24 mL/min — ABNORMAL LOW (ref >60)
Glucose, Bld: 124 mg/dL — ABNORMAL HIGH (ref 65–99)
POTASSIUM: 4.7 mmol/L (ref 3.5–5.1)
SODIUM: 135 mmol/L (ref 135–145)

## 2016-05-04 LAB — CBC
HEMATOCRIT: 35 % — AB (ref 39.0–52.0)
HEMOGLOBIN: 11.7 g/dL — AB (ref 13.0–17.0)
MCH: 32.5 pg (ref 26.0–34.0)
MCHC: 33.4 g/dL (ref 30.0–36.0)
MCV: 97.2 fL (ref 78.0–100.0)
Platelets: 179 10*3/uL (ref 150–400)
RBC: 3.6 MIL/uL — AB (ref 4.22–5.81)
RDW: 14 % (ref 11.5–15.5)
WBC: 11 10*3/uL — ABNORMAL HIGH (ref 4.0–10.5)

## 2016-05-04 NOTE — Progress Notes (Signed)
Explained discharge instructions. Patient signed paperwork.

## 2016-05-04 NOTE — Progress Notes (Signed)
Occupational Therapy Treatment Patient Details Name: [name] [name] MRN: [id number] DOB: 06-06-1940 Today's Date: 05/04/2016    History of present illness 76 y.o. male admitted to [facility] on 05/02/16 for elective L THA, posterior approach.  Pt with significant PMHx of SOB, CKD IV, chronic A-fib, CHF, cardiomyopathy, and cardioversion.     OT comments  Pt progressing towards acute OT goals. Focus of session was functional transfers. Cues needed for posterior precautions during session. LB ADL education reviewed. No family present during session. Recommend HHOT at d/c.  Follow Up Recommendations  Home health OT;Supervision/Assistance - 24 hour    Equipment Recommendations  3 in 1 bedside commode    Recommendations for Other Services      Precautions / Restrictions Precautions Precautions: Posterior Hip Precaution Booklet Issued: Yes (comment) Precaution Comments: Reviewed handout provided by PT Restrictions Weight Bearing Restrictions: Yes LLE Weight Bearing: Weight bearing as tolerated       Mobility Bed Mobility Overal bed mobility: Needs Assistance Bed Mobility: Supine to Sit     Supine to sit: Supervision     General bed mobility comments: vc for maintaining hip precautions  Transfers Overall transfer level: Needs assistance Equipment used: Rolling walker (2 wheeled) Transfers: Sit to/from Stand Sit to Stand: Min assist         General transfer comment: MinA from bed for stability upon upright and maintaining hip precautions    Balance Overall balance assessment: Needs assistance Sitting-balance support: Feet supported;No upper extremity supported Sitting balance-Leahy Scale: Good     Standing balance support: Bilateral upper extremity supported;No upper extremity supported Standing balance-Leahy Scale: Poor Standing balance comment: requires RW for balance                           ADL either performed or assessed with clinical judgement    ADL Overall ADL's : Needs assistance/impaired     Grooming: Min guard;Minimal assistance;Wash/dry hands;Standing                   Armed forces technical officer: Minimal assistance;Ambulation;RW Toilet Transfer Details (indicate cue type and reason): Cues for technique, min A during powerup and to control descent while maintaining precautions.         Functional mobility during ADLs: Min guard;Rolling walker General ADL Comments: Pt completed in-room functional mobility, toilet transfer, standing to wash hands at sink. Cues needed during session to maintain precautions. Improved performance with higher seat surface. ADL education reviewed. Discussed shower transfer.     Vision       Perception     Praxis      Cognition Arousal/Alertness: Awake/alert Behavior During Therapy: WFL for tasks assessed/performed Overall Cognitive Status: Within Functional Limits for tasks assessed                                          Exercises   Shoulder Instructions       General Comments      Pertinent Vitals/ Pain       Pain Assessment: Faces Faces Pain Scale: Hurts a little bit Pain Location: left hip and thigh Pain Descriptors / Indicators: Sore Pain Intervention(s): Monitored during session  Home Living  Prior Functioning/Environment              Frequency  Min 2X/week        Progress Toward Goals  OT Goals(current goals can now be found in the care plan section)  Progress towards OT goals: Progressing toward goals  Acute Rehab OT Goals Patient Stated Goal: to get home and walk without pain OT Goal Formulation: With patient Time For Goal Achievement: 05/17/16 Potential to Achieve Goals: Good ADL Goals Pt Will Perform Grooming: with modified independence;standing Pt Will Perform Lower Body Bathing: with modified independence;with adaptive equipment;sit to/from stand Pt Will Perform  Lower Body Dressing: with modified independence;with adaptive equipment;sit to/from stand Pt Will Transfer to Toilet: with supervision;ambulating;bedside commode Pt Will Perform Toileting - Clothing Manipulation and hygiene: with modified independence;sit to/from stand Pt Will Perform Tub/Shower Transfer: with min guard assist;Shower transfer;3 in 1;rolling walker Additional ADL Goal #1: Pt will adhere to posterior hip precautions during morning ADL routine independently.  Plan Discharge plan needs to be updated    Co-evaluation                 End of Session Equipment Utilized During Treatment: Rolling walker  OT Visit Diagnosis: Unsteadiness on feet (R26.81);Pain Pain - Right/Left: Left Pain - part of body: Hip   Activity Tolerance Patient tolerated treatment well   Patient Left in chair;with call bell/phone within reach   Nurse Communication          Time: 2979-8921 OT Time Calculation (min): 22 min  Charges: OT General Charges $OT Visit: 1 Procedure OT Treatments $Self Care/Home Management : 8-22 mins    [name] [name] 05/04/2016, 11:45 AM

## 2016-05-04 NOTE — Care Management Note (Signed)
Case Management Note  Patient Details  Name: ORMAND SENN MRN: [id number] Date of Birth: Aug 03, 1940  Subjective/Objective:   76 yr old gentleman s/p left total knee arthroplasty.                 Action/Plan: Case manager spoke with patient concerning discharge plan and DME. Patient states he will be going home with his granddaughter and son. He has RW and 3in1. Choice was offered for [facility] agency, referral was called to [place], [place].    Expected Discharge Date:  05/04/16               Expected Discharge Plan:  [place]  In-House Referral:     Discharge planning Services  CM Consult  Post Acute Care Choice:  Home Health Choice offered to:  Patient  DME Arranged:  N/A (has RW and 3in1) DME Agency:  NA  HH Arranged:  PT [place] Agency:  [place]  Status of Service:  Completed, signed off  If discussed at [place] of Stay Meetings, dates discussed:    Additional Comments:  Ninfa Meeker, RN 05/04/2016, 2:39 PM

## 2016-05-04 NOTE — Progress Notes (Signed)
Physical Therapy Treatment Patient Details Name: [name] [name] MRN: [id number] DOB: 1940/05/20 Today's Date: 05/04/2016    History of Present Illness 76 y.o. male admitted to [facility] on 05/02/16 for elective L THA, posterior approach.  Pt with significant PMHx of SOB, CKD IV, chronic A-fib, CHF, cardiomyopathy, and cardioversion.      PT Comments    Pt is progressing towards his goals. Pt is min guard for transfers with RW and minA for ambulation of 250 feet with RW and ascend/descend of 3 stairs. Pt can safely ambulate distance into his house from car and get up stairs into his house, however pt fatigues easily and educated on need to be aware to stop before he becomes short of breath and not to walk without someone to assist him. Pt requires continued skilled PT to progress his ambulation and to improve LE strength and endurance to safely navigate his home environment.     Follow Up Recommendations  Home health PT;Supervision/Assistance - 24 hour     Equipment Recommendations  Rolling walker with 5" wheels;3in1 (PT)    Recommendations for Other Services OT consult     Precautions / Restrictions Precautions Precautions: Posterior Hip Precaution Booklet Issued: Yes (comment) Precaution Comments: Reviewed handout provided by PT Restrictions Weight Bearing Restrictions: Yes LLE Weight Bearing: Weight bearing as tolerated    Mobility  Bed Mobility Overal bed mobility: Needs Assistance Bed Mobility: Supine to Sit     Supine to sit: Supervision     General bed mobility comments: up in recliner at entry  Transfers Overall transfer level: Needs assistance Equipment used: Rolling walker (2 wheeled) Transfers: Sit to/from Stand Sit to Stand: Min guard         General transfer comment: increased time to upright but steadies himself with RW  Ambulation/Gait Ambulation/Gait assistance: Min assist Ambulation Distance (Feet): 250 Feet Assistive device: Rolling walker (2  wheeled) Gait Pattern/deviations: Step-through pattern;Shuffle;Trunk flexed;Decreased weight shift to left Gait velocity: slowed Gait velocity interpretation: Below normal speed for age/gender General Gait Details: pt with steadier gait but as he gets tired requires minA for steadying,. Pt educated to keep emabulation distances short to begin with and to always walk with someone   Stairs     Stair Management: One rail Right;Step to pattern;Forwards Number of Stairs: 3 General stair comments: pt min guard with steps in afternoon session, pt remembered proper sequencing and able to teach back to PT      Balance Overall balance assessment: Needs assistance Sitting-balance support: Feet supported;No upper extremity supported Sitting balance-Leahy Scale: Good     Standing balance support: Bilateral upper extremity supported;No upper extremity supported Standing balance-Leahy Scale: Poor Standing balance comment: requires RW for balance                            Cognition Arousal/Alertness: Awake/alert Behavior During Therapy: WFL for tasks assessed/performed Overall Cognitive Status: Within Functional Limits for tasks assessed                                        Exercises Total Joint Exercises Ankle Circles/Pumps: AROM;20 reps;Seated Quad Sets: AROM;10 reps;Left;Seated Long Arc Quad: AROM;10 reps;Seated;Both Marching in Standing: AROM;Both;10 reps;Standing    General Comments        Pertinent Vitals/Pain Pain Assessment: Faces Faces Pain Scale: Hurts a little bit Pain Location: left hip  and thigh Pain Descriptors / Indicators: Sore Pain Intervention(s): Ice applied;Monitored during session  Pt with DOE of 2/4 with 250 feet ambulation HR 115 and SaO2 91% O2 on RA            PT Goals (current goals can now be found in the care plan section) Acute Rehab PT Goals Patient Stated Goal: to get home and walk without pain PT Goal  Formulation: With patient Time For Goal Achievement: 05/10/16 Potential to Achieve Goals: Good Progress towards PT goals: Progressing toward goals    Frequency    7X/week      PT Plan Current plan remains appropriate       End of Session Equipment Utilized During Treatment: Gait belt Activity Tolerance: Patient tolerated treatment well Patient left: in chair;with call bell/phone within reach Nurse Communication: Mobility status;Other (comment) (ready for discharge ) PT Visit Diagnosis: Unsteadiness on feet (R26.81);Other abnormalities of gait and mobility (R26.89);Pain Pain - Right/Left: Left Pain - part of body: Hip     Time: 6808-8110 PT Time Calculation (min) (ACUTE ONLY): 30 min  Charges:  $Gait Training: 23-37 mins                    G Codes:       Yoko Mcgahee B. Migdalia Dk PT, DPT Acute Rehabilitation  [phone] Pager [phone]     [place] 05/04/2016, 3:40 PM

## 2016-05-04 NOTE — Progress Notes (Signed)
Patient ID: [name] [name], male   DOB: 06-Jul-1940, 76 y.o.   MRN: [id number]     Subjective:  Patient reports pain as mild.  Patient reports improvement and that he would like to go home   Objective:   VITALS:   Vitals:   05/03/16 1205 05/03/16 1314 05/03/16 1950 05/04/16 0430  BP:  135/81 115/69 116/89  Pulse: (!) 130 100 100 92  Resp:  15 15 15   Temp:  98.7 F (37.1 C) 100 F (37.8 C) 99.3 F (37.4 C)  TempSrc:  Oral Oral Oral  SpO2: 94% 98% 97% 99%    ABD soft Sensation intact distally Dorsiflexion/Plantar flexion intact Incision: dressing C/D/I and no drainage   Lab Results  Component Value Date   WBC 8.9 05/03/2016   HGB 13.1 05/03/2016   HCT 38.5 (L) 05/03/2016   MCV 96.7 05/03/2016   PLT 214 05/03/2016   BMET    Component Value Date/Time   NA 137 05/03/2016 0758   NA 143 06/24/2015 0911   K 4.5 05/03/2016 0758   CL 105 05/03/2016 0758   CO2 26 05/03/2016 0758   GLUCOSE 157 (H) 05/03/2016 0758   BUN 18 05/03/2016 0758   BUN 27 06/24/2015 0911   CREATININE 1.92 (H) 05/03/2016 0758   CREATININE 1.86 (H) 10/15/2015 1251   CALCIUM 8.3 (L) 05/03/2016 0758   GFRNONAA 32 (L) 05/03/2016 0758   GFRNONAA 35 (L) 10/15/2015 1251   GFRAA 37 (L) 05/03/2016 0758   GFRAA 40 (L) 10/15/2015 1251     Assessment/Plan: 2 Days Post-Op   Principal Problem:   Primary localized osteoarthritis of left hip   Advance diet Up with therapy Discharge home with home health WBAT Dry dressing PRN Follow up with Dr Mardelle Matte as scheduled   Rande Brunt, [place] 05/04/2016, 7:07 AM  Discussed and agree with above.  Marchia Bond, MD Cell [phone]

## 2016-05-04 NOTE — Discharge Summary (Signed)
Physician Discharge Summary  Patient ID: [name] [name] MRN: [id number] DOB/AGE: 76-Jul-1942 76 y.o.  Admit date: 05/02/2016 Discharge date: 05/04/2016  Admission Diagnoses:  Primary localized osteoarthritis of left hip  Discharge Diagnoses:  Principal Problem:   Primary localized osteoarthritis of left hip   Past Medical History:  Diagnosis Date  . Cardiomyopathy [facility])    h/o felt 2nd atrial fib, EF normal by echo 2013  . CHF (congestive heart failure) ([place])   . Chronic anticoagulation   . Chronic atrial fibrillation ([place])   . Chronic kidney disease (CKD), stage IV (severe) ([place])   . Dysrhythmia   . Hypothyroidism   . Pneumonia   . Pre-diabetes   . Primary localized osteoarthritis of left hip 05/02/2016  . Prostate cancer ([place])   . Shortness of breath   . Systemic hypertension     Surgeries: Procedure(s): LEFT TOTAL HIP ARTHROPLASTY on 05/02/2016   Consultants (if any):   Discharged Condition: Improved  Hospital Course: [name] [name] is an 75 y.o. male who was admitted 05/02/2016 with a diagnosis of Primary localized osteoarthritis of left hip and went to the operating room on 05/02/2016 and underwent the above named procedures.    He was given perioperative antibiotics:  Anti-infectives    Start     Dose/Rate Route Frequency Ordered Stop   05/02/16 2015  ceFAZolin (ANCEF) IVPB 2g/100 mL premix     2 g 200 mL/hr over 30 Minutes Intravenous Every 6 hours 05/02/16 2005 05/03/16 0245   05/02/16 1230  ceFAZolin (ANCEF) IVPB 2g/100 mL premix     2 g 200 mL/hr over 30 Minutes Intravenous To ShortStay Surgical 05/01/16 0939 05/02/16 1400    .  He was given sequential compression devices, early ambulation, and xarelto per his long term anticoagulation for DVT prophylaxis.  He benefited maximally from the hospital stay and there were no complications.    Recent vital signs:  Vitals:   05/03/16 1950 05/04/16 0430  BP: 115/69 116/89  Pulse: 100 92  Resp: 15 15   Temp: 100 F (37.8 C) 99.3 F (37.4 C)    Recent laboratory studies:  Lab Results  Component Value Date   HGB 11.7 (L) 05/04/2016   HGB 13.1 05/03/2016   HGB 13.5 04/19/2016   Lab Results  Component Value Date   WBC 11.0 (H) 05/04/2016   PLT 179 05/04/2016   Lab Results  Component Value Date   INR 1.70 10/02/2015   Lab Results  Component Value Date   NA 137 05/03/2016   K 4.5 05/03/2016   CL 105 05/03/2016   CO2 26 05/03/2016   BUN 18 05/03/2016   CREATININE 1.92 (H) 05/03/2016   GLUCOSE 157 (H) 05/03/2016    Discharge Medications:   Allergies as of 05/04/2016      Reactions   No Known Allergies       Medication List    STOP taking these medications   HYDROcodone-acetaminophen 5-325 MG tablet Commonly known as:  NORCO/VICODIN     TAKE these medications   baclofen 10 MG tablet Commonly known as:  LIORESAL Take 1 tablet (10 mg total) by mouth 3 (three) times daily. As needed for muscle spasm   CALCIUM 600+D3 600-800 MG-UNIT Tabs Generic drug:  Calcium Carb-Cholecalciferol Take 1 tablet by mouth 2 (two) times daily.   levothyroxine 50 MCG tablet Commonly known as:  SYNTHROID, LEVOTHROID TAKE ONE TABLET BY MOUTH IN THE MORNING   metoprolol 50 MG tablet Commonly known as:  LOPRESSOR Take 1 tablet (50 mg total) by mouth 2 (two) times daily.   ondansetron 4 MG tablet Commonly known as:  ZOFRAN Take 1 tablet (4 mg total) by mouth every 8 (eight) hours as needed for nausea or vomiting.   oxyCODONE 5 MG immediate release tablet Commonly known as:  ROXICODONE Take 1-2 tablets (5-10 mg total) by mouth every 4 (four) hours as needed for severe pain.   QC FERROUS SULFATE 325 (65 FE) MG tablet Generic drug:  ferrous sulfate Take 325 mg by mouth daily with breakfast.   Rivaroxaban 15 MG Tabs tablet Commonly known as:  XARELTO Take 1 tablet (15 mg total) by mouth daily with supper.   sennosides-docusate sodium 8.6-50 MG tablet Commonly known as:   SENOKOT-S Take 2 tablets by mouth daily.   sodium bicarbonate 650 MG tablet Take 650 mg by mouth 2 (two) times daily.   Vitamin D3 5000 units Caps Take 1 capsule (5,000 Units total) by mouth daily.       Diagnostic Studies: Dg Hip Port Unilat With Pelvis 1v Left  Result Date: 05/02/2016 CLINICAL DATA:  Postop left hip replacement, history of prior prostate surgery EXAM: DG HIP (WITH OR WITHOUT PELVIS) 1V PORT LEFT COMPARISON:  Pelvis and left hip films of 11/04/2015 FINDINGS: A left total hip replacement has been performed. The acetabular and femoral components appear to be in good position with no complicating features. Surgical clips are noted in the pelvis from prior prostate surgery. Degenerative change is noted surrounding the right hip. IMPRESSION: Left total hip replacement components in good position with no complicating features. Electronically Signed   By: Ivar Drape M.D.   On: 05/02/2016 17:03    Disposition: 01-Home or Self Care    Follow-up Information    Jevon Littlepage P, MD. Schedule an appointment as soon as possible for a visit in 2 weeks.   Specialty:  Orthopedic Surgery Contact information: [address] [place] [address] [phone]            Signed: Johnny Bridge 05/04/2016, 7:54 AM

## 2016-05-04 NOTE — Progress Notes (Signed)
Physical Therapy Treatment Patient Details Name: [name] [name] MRN: [id number] DOB: 05-26-1940 Today's Date: 05/04/2016    History of Present Illness 76 y.o. male admitted to [facility] on 05/02/16 for elective L THA, posterior approach.  Pt with significant PMHx of SOB, CKD IV, chronic A-fib, CHF, cardiomyopathy, and cardioversion.      PT Comments    Pt is making progress towards his goals. Pt is supervision for bed mobility, minA for transfers with RW and ambulation of 200 feet. Pt is mod assist for ascend/descend of 3 stairs using right handrail for steadying after LoB. Pt to practice stairs again in afternoon session before going home. Pt requires continued skilled PT for gait and stair training and to improve LE strength and ROM to be able to safely navigate his home environment at discharge.     Follow Up Recommendations  Home health PT     Equipment Recommendations  Rolling walker with 5" wheels;3in1 (PT)    Recommendations for Other Services OT consult     Precautions / Restrictions Precautions Precautions: Posterior Hip Precaution Booklet Issued: Yes (comment) Precaution Comments: Reviewed handout provided by PT Restrictions Weight Bearing Restrictions: Yes LLE Weight Bearing: Weight bearing as tolerated    Mobility  Bed Mobility Overal bed mobility: Needs Assistance Bed Mobility: Supine to Sit     Supine to sit: Supervision     General bed mobility comments: vc for maintaining hip precautions  Transfers Overall transfer level: Needs assistance Equipment used: Rolling walker (2 wheeled) Transfers: Sit to/from Stand Sit to Stand: Min assist         General transfer comment: MinA from bed for stability upon upright and maintaining hip precautions  Ambulation/Gait Ambulation/Gait assistance: Min assist Ambulation Distance (Feet): 200 Feet Assistive device: Rolling walker (2 wheeled) Gait Pattern/deviations: Step-through pattern;Shuffle;Trunk  flexed;Decreased weight shift to left;Step-to pattern Gait velocity: slowed Gait velocity interpretation: Below normal speed for age/gender General Gait Details: pt progressed from step-to to step through pattern, vc for staying inside walker, upright posture , keeping feet pointed forward , as patient fatigues required minA to keep walker close and keep steady   Stairs Stairs: Yes   Stair Management: One rail Right;Step to pattern;Forwards Number of Stairs: 3 General stair comments: pt with minA to steady, on descent pt required modA to steady after LoB on last step down, pt with increase hold on railing.       Balance Overall balance assessment: Needs assistance Sitting-balance support: Feet supported;No upper extremity supported Sitting balance-Leahy Scale: Good     Standing balance support: Bilateral upper extremity supported;No upper extremity supported Standing balance-Leahy Scale: Poor Standing balance comment: requires RW for balance                            Cognition Arousal/Alertness: Awake/alert Behavior During Therapy: WFL for tasks assessed/performed Overall Cognitive Status: Within Functional Limits for tasks assessed                                        Exercises Total Joint Exercises Ankle Circles/Pumps: AROM;20 reps;Seated Quad Sets: AROM;10 reps;Left;Seated Long Arc Quad: AROM;10 reps;Seated;Both Marching in Standing: AROM;Both;10 reps;Standing    General Comments        Pertinent Vitals/Pain Pain Assessment: Faces Faces Pain Scale: Hurts a little bit Pain Location: left hip and thigh Pain Descriptors / Indicators: Sore  Pain Intervention(s): Monitored during session  DOE of 2/4 HR 85bpm and SaO2 93% O2 on RA, pt reports always short of breath since he had double pneumonia in 12/2015           PT Goals (current goals can now be found in the care plan section) Acute Rehab PT Goals Patient Stated Goal: to get home  and walk without pain PT Goal Formulation: With patient Time For Goal Achievement: 05/10/16 Potential to Achieve Goals: Good Progress towards PT goals: Progressing toward goals    Frequency    7X/week      PT Plan Current plan remains appropriate    Co-evaluation             End of Session Equipment Utilized During Treatment: Gait belt Activity Tolerance: Patient tolerated treatment well Patient left: in chair;with call bell/phone within reach Nurse Communication: Mobility status PT Visit Diagnosis: Unsteadiness on feet (R26.81);Other abnormalities of gait and mobility (R26.89);Pain Pain - Right/Left: Left Pain - part of body: Hip     Time: 0823-0850 PT Time Calculation (min) (ACUTE ONLY): 27 min  Charges:  $Gait Training: 23-37 mins                    G Codes:       Soffia Doshier B. Migdalia Dk PT, DPT Acute Rehabilitation  [phone] Pager [phone]     [place] 05/04/2016, 12:40 PM

## 2016-05-05 DIAGNOSIS — Z7901 Long term (current) use of anticoagulants: Secondary | ICD-10-CM | POA: Diagnosis not present

## 2016-05-05 DIAGNOSIS — Z96642 Presence of left artificial hip joint: Secondary | ICD-10-CM | POA: Diagnosis not present

## 2016-05-05 DIAGNOSIS — I509 Heart failure, unspecified: Secondary | ICD-10-CM | POA: Diagnosis not present

## 2016-05-05 DIAGNOSIS — Z471 Aftercare following joint replacement surgery: Secondary | ICD-10-CM | POA: Diagnosis not present

## 2016-05-05 DIAGNOSIS — R7303 Prediabetes: Secondary | ICD-10-CM | POA: Diagnosis not present

## 2016-05-05 DIAGNOSIS — N184 Chronic kidney disease, stage 4 (severe): Secondary | ICD-10-CM | POA: Diagnosis not present

## 2016-05-05 DIAGNOSIS — Z8546 Personal history of malignant neoplasm of prostate: Secondary | ICD-10-CM | POA: Diagnosis not present

## 2016-05-05 DIAGNOSIS — Z79891 Long term (current) use of opiate analgesic: Secondary | ICD-10-CM | POA: Diagnosis not present

## 2016-05-17 DIAGNOSIS — Z96642 Presence of left artificial hip joint: Secondary | ICD-10-CM | POA: Diagnosis not present

## 2016-05-19 DIAGNOSIS — I509 Heart failure, unspecified: Secondary | ICD-10-CM | POA: Diagnosis not present

## 2016-05-19 DIAGNOSIS — N184 Chronic kidney disease, stage 4 (severe): Secondary | ICD-10-CM | POA: Diagnosis not present

## 2016-05-19 DIAGNOSIS — Z7901 Long term (current) use of anticoagulants: Secondary | ICD-10-CM | POA: Diagnosis not present

## 2016-05-19 DIAGNOSIS — Z8546 Personal history of malignant neoplasm of prostate: Secondary | ICD-10-CM | POA: Diagnosis not present

## 2016-05-19 DIAGNOSIS — Z471 Aftercare following joint replacement surgery: Secondary | ICD-10-CM | POA: Diagnosis not present

## 2016-05-19 DIAGNOSIS — Z79891 Long term (current) use of opiate analgesic: Secondary | ICD-10-CM | POA: Diagnosis not present

## 2016-05-19 DIAGNOSIS — R7303 Prediabetes: Secondary | ICD-10-CM | POA: Diagnosis not present

## 2016-05-19 DIAGNOSIS — Z96642 Presence of left artificial hip joint: Secondary | ICD-10-CM | POA: Diagnosis not present

## 2016-06-03 ENCOUNTER — Other Ambulatory Visit: Payer: Self-pay | Admitting: Cardiovascular Disease

## 2016-06-06 NOTE — Telephone Encounter (Signed)
Est CrCl = 64ml/min

## 2016-06-09 NOTE — Addendum Note (Signed)
Addendum  created 06/09/16 1414 by Rica Koyanagi, MD   Sign clinical note

## 2016-06-19 DIAGNOSIS — Z96642 Presence of left artificial hip joint: Secondary | ICD-10-CM | POA: Diagnosis not present

## 2016-06-21 ENCOUNTER — Other Ambulatory Visit: Payer: Self-pay | Admitting: "Endocrinology

## 2016-06-21 DIAGNOSIS — E212 Other hyperparathyroidism: Secondary | ICD-10-CM | POA: Diagnosis not present

## 2016-06-21 DIAGNOSIS — E039 Hypothyroidism, unspecified: Secondary | ICD-10-CM | POA: Diagnosis not present

## 2016-06-21 LAB — T4, FREE: Free T4: 1.2 ng/dL (ref 0.8–1.8)

## 2016-06-21 LAB — TSH: TSH: 6.16 mIU/L — ABNORMAL HIGH (ref 0.40–4.50)

## 2016-06-22 LAB — PTH, INTACT AND CALCIUM
CALCIUM: 9.1 mg/dL (ref 8.6–10.3)
PTH: 18 pg/mL (ref 14–64)

## 2016-06-26 ENCOUNTER — Ambulatory Visit (INDEPENDENT_AMBULATORY_CARE_PROVIDER_SITE_OTHER): Payer: PPO | Admitting: "Endocrinology

## 2016-06-26 ENCOUNTER — Encounter: Payer: Self-pay | Admitting: "Endocrinology

## 2016-06-26 ENCOUNTER — Encounter (INDEPENDENT_AMBULATORY_CARE_PROVIDER_SITE_OTHER): Payer: Self-pay | Admitting: Internal Medicine

## 2016-06-26 ENCOUNTER — Ambulatory Visit (INDEPENDENT_AMBULATORY_CARE_PROVIDER_SITE_OTHER): Payer: PPO | Admitting: Internal Medicine

## 2016-06-26 VITALS — BP 122/78 | HR 72 | Temp 98.4°F | Ht 74.5 in | Wt 264.2 lb

## 2016-06-26 VITALS — BP 137/79 | HR 103 | Ht 74.5 in | Wt 265.0 lb

## 2016-06-26 DIAGNOSIS — K625 Hemorrhage of anus and rectum: Secondary | ICD-10-CM

## 2016-06-26 DIAGNOSIS — E212 Other hyperparathyroidism: Secondary | ICD-10-CM

## 2016-06-26 DIAGNOSIS — R7303 Prediabetes: Secondary | ICD-10-CM

## 2016-06-26 DIAGNOSIS — E039 Hypothyroidism, unspecified: Secondary | ICD-10-CM

## 2016-06-26 DIAGNOSIS — I10 Essential (primary) hypertension: Secondary | ICD-10-CM | POA: Diagnosis not present

## 2016-06-26 DIAGNOSIS — E559 Vitamin D deficiency, unspecified: Secondary | ICD-10-CM | POA: Diagnosis not present

## 2016-06-26 LAB — CBC WITH DIFFERENTIAL/PLATELET
BASOS ABS: 154 {cells}/uL (ref 0–200)
Basophils Relative: 2 %
Eosinophils Absolute: 231 cells/uL (ref 15–500)
Eosinophils Relative: 3 %
HCT: 37.4 % — ABNORMAL LOW (ref 38.5–50.0)
HEMOGLOBIN: 12.5 g/dL — AB (ref 13.2–17.1)
LYMPHS ABS: 3619 {cells}/uL (ref 850–3900)
Lymphocytes Relative: 47 %
MCH: 31.4 pg (ref 27.0–33.0)
MCHC: 33.4 g/dL (ref 32.0–36.0)
MCV: 94 fL (ref 80.0–100.0)
MONO ABS: 616 {cells}/uL (ref 200–950)
MPV: 8.6 fL (ref 7.5–12.5)
Monocytes Relative: 8 %
NEUTROS PCT: 40 %
Neutro Abs: 3080 cells/uL (ref 1500–7800)
Platelets: 291 10*3/uL (ref 140–400)
RBC: 3.98 MIL/uL — AB (ref 4.20–5.80)
RDW: 14.3 % (ref 11.0–15.0)
WBC: 7.7 10*3/uL (ref 3.8–10.8)

## 2016-06-26 MED ORDER — LEVOTHYROXINE SODIUM 75 MCG PO TABS: 75.0000 ug | ORAL_TABLET | Freq: Every day | ORAL | 6 refills | Status: DC

## 2016-06-26 NOTE — Progress Notes (Signed)
Subjective:    Patient ID: [name] [name], male    DOB: Jun 12, 1940, PCP [name], MD   Past Medical History:  Diagnosis Date  . Cardiomyopathy [facility])    h/o felt 2nd atrial fib, EF normal by echo 2013  . CHF (congestive heart failure) ([place])   . Chronic anticoagulation   . Chronic atrial fibrillation ([place])   . Chronic kidney disease (CKD), stage IV (severe) ([place])   . Dysrhythmia   . Hypothyroidism   . Pneumonia   . Pre-diabetes   . Primary localized osteoarthritis of left hip 05/02/2016  . Prostate cancer ([place])   . Shortness of breath   . Systemic hypertension    Past Surgical History:  Procedure Laterality Date  . CARDIOVERSION    . CATARACT EXTRACTION W/PHACO  04/17/2011   Procedure: CATARACT EXTRACTION PHACO AND INTRAOCULAR LENS PLACEMENT (IOC);  Surgeon: [name], MD;  Location: [facility];  Service: Ophthalmology;  Laterality: Left;  CDE:14.58  . CATARACT EXTRACTION W/PHACO  05/22/2011   Procedure: CATARACT EXTRACTION PHACO AND INTRAOCULAR LENS PLACEMENT (IOC);  Surgeon: [name], MD;  Location: [facility];  Service: Ophthalmology;  Laterality: Right;  CDE 13.02  . COLONOSCOPY  01/17/2011   Procedure: COLONOSCOPY;  Surgeon: Jamesetta So;  Location: [facility];  Service: Gastroenterology;  Laterality: N/A;  . COLONOSCOPY N/A 02/24/2016   Procedure: COLONOSCOPY;  Surgeon: Rogene Houston, MD;  Location: [facility];  Service: Endoscopy;  Laterality: N/A;  12:50  . INGUINAL HERNIA REPAIR Right 09/27/2015   Procedure: RIGHT INGUINAL HERNIORRHAPY WITH MESH SMALL BOWEL RESECTION;  Surgeon: Aviva Signs, MD;  Location: [facility];  Service: General;  Laterality: Right;  . NM MYOCAR [place]  02/2009   dipyridamole; mild perfusion defect due to infarct/scar with mild perinfarct ischemia is apical septal, apical basal inferoseptal, basal inferior, mid inferoseptal mid inferior & apical inferior; EKG negative for ischemia; low risk scan  . PROSTATE SURGERY     7 years ago  [facility] Dr Javaid-prostatectomy  . PROSTATECTOMY    . Sleep Study  05/2011   increased upper airway resistance syndrome with mild sleep apnea during REM  . TOTAL HIP ARTHROPLASTY Left 05/02/2016   Procedure: LEFT TOTAL HIP ARTHROPLASTY;  Surgeon: Marchia Bond, MD;  Location: [facility];  Service: Orthopedics;  Laterality: Left;  . TRANSTHORACIC ECHOCARDIOGRAM  04/2011   EF=50-55%; RV mildly dilated; LA mild-mod dilated; mild mitral valve annular calcif, mild MR; mod TR, RVSP elevated 30-77mmHg, mild pulm HTN; AV mildly sclerotic, mild calcif of AV leaflets; trace pulm valve regurg    Social History   Social History  . Marital status: Single    Spouse name: N/A  . Number of children: 2  . Years of education: N/A   Social History Main Topics  . Smoking status: Never Smoker  . Smokeless tobacco: Never Used  . Alcohol use No  . Drug use: No  . Sexual activity: Yes    Birth control/ protection: None   Other Topics Concern  . None   Social History Narrative  . None   Outpatient Encounter Prescriptions as of 06/26/2016  Medication Sig  . Calcium Carb-Cholecalciferol (CALCIUM 600+D3) 600-800 MG-UNIT TABS Take 1 tablet by mouth 2 (two) times daily.  . Cholecalciferol (VITAMIN D3) 5000 units CAPS Take 1 capsule (5,000 Units total) by mouth daily.  . ferrous sulfate (QC FERROUS SULFATE) 325 (65 FE) MG tablet Take 325 mg by mouth daily with breakfast.  .  levothyroxine (SYNTHROID, LEVOTHROID) 50 MCG tablet TAKE ONE TABLET BY MOUTH IN THE MORNING  . metoprolol (LOPRESSOR) 50 MG tablet Take 1 tablet (50 mg total) by mouth 2 (two) times daily.  . sodium bicarbonate 650 MG tablet Take 650 mg by mouth 2 (two) times daily.  Alveda Reasons 15 MG TABS tablet TAKE ONE TABLET BY MOUTH ONCE DAILY WITH SUPPER  . [DISCONTINUED] baclofen (LIORESAL) 10 MG tablet Take 1 tablet (10 mg total) by mouth 3 (three) times daily. As needed for muscle spasm  . [DISCONTINUED] ondansetron (ZOFRAN) 4 MG tablet Take 1 tablet (4 mg  total) by mouth every 8 (eight) hours as needed for nausea or vomiting.  . [DISCONTINUED] oxyCODONE (ROXICODONE) 5 MG immediate release tablet Take 1-2 tablets (5-10 mg total) by mouth every 4 (four) hours as needed for severe pain.  . [DISCONTINUED] sennosides-docusate sodium (SENOKOT-S) 8.6-50 MG tablet Take 2 tablets by mouth daily.   Facility-Administered Encounter Medications as of 06/26/2016  Medication  . neomycin-polymyxin-dexameth (MAXITROL) 0.1 % ophth ointment   ALLERGIES: Allergies  Allergen Reactions  . No Known Allergies    VACCINATION STATUS:  There is no immunization history on file for this patient.  HPI  76 yr old male with above medical hx. he is here to f/u for hypothyroidism , hypocalcemia, vitamin D deficiency.  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 he is compliant. He has no new complaints.  he denies cold/heat intolerance. - He is progressively gaining weight. He has gained 20 pounds since last visit.  he denies hx of goiter , nor family hx of thyroid cancer. he survives prostate cancer. -he has history of tertiary hyperparathyroidism with PTH as high as 52 which is currently improving to 18 with supplement of calcium and vitamin D. His calcium is corrected at 9.1.  his prior 24 hour urine calcium has been  low at 57.   Review of Systems  Constitutional: + weight gain, no fatigue, no subjective hyperthermia/hypothermia Eyes: no blurry vision, no xerophthalmia ENT: no sore throat, no nodules palpated in throat, no dysphagia/odynophagia, no hoarseness Cardiovascular: no CP/SOB/palpitations/leg swelling Respiratory: no cough/SOB Gastrointestinal: no N/V/D/C Musculoskeletal: no muscle/joint aches Skin: no rashes Neurological: no tremors/numbness/tingling/dizziness Psychiatric: no depression/anxiety  Objective:    BP 137/79   Pulse (!) 103   Ht 6' 2.5" (1.892 m)    Wt 265 lb (120.2 kg)   BMI 33.57 kg/m   Wt Readings from Last 3 Encounters:  06/26/16 265 lb (120.2 kg)  05/04/16 254 lb 13.6 oz (115.6 kg)  04/19/16 254 lb 14.4 oz (115.6 kg)    Physical Exam   Constitutional: overweight, in NAD Eyes: PERRLA, EOMI, no exophthalmos ENT: moist mucous membranes, no thyromegaly, no cervical lymphadenopathy, poor dental condition. Cardiovascular: RRR, No MRG Respiratory: CTA B Gastrointestinal: abdomen soft, NT, ND, BS+ Musculoskeletal: no deformities, strength intact in all 4 Skin: moist, warm, no rashes Neurological: no tremor with outstretched hands, DTR normal in all 4  Results for orders placed or performed in visit on 06/21/16  TSH  Result Value Ref Range   TSH 6.16 (H) 0.40 - 4.50 mIU/L  T4, free  Result Value Ref Range   Free T4 1.2 0.8 - 1.8 ng/dL  PTH, intact and calcium  Result Value Ref Range   PTH 18 14 - 64 pg/mL   Calcium 9.1 8.6 - 10.3 mg/dL    Lipid  Panel     Component Value Date/Time   CHOL  02/04/2009 0538    142        ATP III CLASSIFICATION:  <200     mg/dL   Desirable  200-239  mg/dL   Borderline High  >=240    mg/dL   High          TRIG 122 02/04/2009 0538   HDL 33 (L) 02/04/2009 0538   CHOLHDL 4.3 02/04/2009 0538   VLDL 24 02/04/2009 0538   LDLCALC  02/04/2009 0538    85        Total Cholesterol/HDL:CHD Risk Coronary Heart Disease Risk Table                     Men   Women  1/2 Average Risk   3.4   3.3  Average Risk       5.0   4.4  2 X Average Risk   9.6   7.1  3 X Average Risk  23.4   11.0        Use the calculated Patient Ratio above and the CHD Risk Table to determine the patient's CHD Risk.        ATP III CLASSIFICATION (LDL):  <100     mg/dL   Optimal  100-129  mg/dL   Near or Above                    Optimal  130-159  mg/dL   Borderline  160-189  mg/dL   High  >190     mg/dL   Very High      Assessment & Plan:   1.  hypothyroidism - His most recent thyroid function tests are  consistent with underage replacement. I will increase his levothyroxine to 75 g by mouth every morning.    - We discussed about correct intake of levothyroxine, at fasting, with water, separated by at least 30 minutes from breakfast, and separated by more than 4 hours from calcium, iron, multivitamins, acid reflux medications (PPIs). -Patient is made aware of the fact that thyroid hormone replacement is needed for life, dose to be adjusted by periodic monitoring of thyroid function tests.   2. Tertiary hyperparathyroidism  With Hypocalcemia  His last PTH is better at 18 from 60 ( could be CKD effect) along with calcium of low normal at 9.1. His calcium level is improving. -I advised him to continue daily supplement of calcium along with vitamin D. His prior 24 hour urine calcium has been  low at 57  3. Vitamin D deficiency: - He is now vitamin D replete at 45. He just needs maintenance therapy.   - changes vitamin D supplement to vitamin D3 5000 units daily for the next 90 days.   4. Hypertension: Controlled. I advised him to continue losartan and carvedilol.  5.  Obesity with prediabetes At risk for diabetes.  He has regained his weight that he lost status post surgery for small bowel obstruction.  He will not need treatment at this time. I gave him dietary advice to avoid simple carbs. Will check a1c next visit.  - I advised patient to maintain close follow up with [name], MD for primary care needs. Follow up plan: Return in about 6 months (around 12/26/2016) for follow up with pre-visit labs.  Glade Lloyd, MD Phone: [phone]  Fax: [phone]   06/26/2016, 9:00 AM

## 2016-06-26 NOTE — Patient Instructions (Addendum)
CBC today.  Any further bleeding: go to the ED.

## 2016-06-26 NOTE — Progress Notes (Signed)
Subjective:    Patient ID: [name] [name], male    DOB: 21-Apr-1940, 76 y.o.   MRN: [id number]  HPI  Presents today with c/o rectal bleeding. Bleeding started a couple of weeks ago. States the bleeding was pretty bad a couple of weeks ago. Says bleeding was bright red.  Saw blood 1 week ago.  Has been on iron for 6 months. Stools were dark green but now they are brown. The bleeding was bright red. Has a BM x 2 a day.  Also states he has had some bleeding form his penis.  Hx of atrial fib and maintained on Xarelto.  Left hip replacement about 8 weeks ago.   CBC    Component Value Date/Time   WBC 11.0 (H) 05/04/2016 0658   RBC 3.60 (L) 05/04/2016 0658   HGB 11.7 (L) 05/04/2016 0658   HCT 35.0 (L) 05/04/2016 0658   PLT 179 05/04/2016 0658   MCV 97.2 05/04/2016 0658   MCH 32.5 05/04/2016 0658   MCHC 33.4 05/04/2016 0658   RDW 14.0 05/04/2016 0658   LYMPHSABS 2,944 11/22/2015 0944   MONOABS 828 11/22/2015 0944   EOSABS 184 11/22/2015 0944   BASOSABS 0 11/22/2015 0944     Colonoscopy in February of 2018.  Impression: Diverticulosis in sigmoid and  descending colon.                           - A few non-bleeding rectal angioectasias(radiation                            proctitis).                           - External and internal hemorrhoids.                           - No specimens collected. Review of Systems Past Medical History:  Diagnosis Date  . Cardiomyopathy [facility])    h/o felt 2nd atrial fib, EF normal by echo 2013  . CHF (congestive heart failure) ([place])   . Chronic anticoagulation   . Chronic atrial fibrillation ([place])   . Chronic kidney disease (CKD), stage IV (severe) ([place])   . Dysrhythmia   . Hypothyroidism   . Pneumonia   . Pre-diabetes   . Primary localized osteoarthritis of left hip 05/02/2016  . Prostate cancer ([place])   . Shortness of breath   . Systemic hypertension     Past Surgical History:  Procedure Laterality Date  . CARDIOVERSION    . CATARACT  EXTRACTION W/PHACO  04/17/2011   Procedure: CATARACT EXTRACTION PHACO AND INTRAOCULAR LENS PLACEMENT (IOC);  Surgeon: [name], MD;  [facility]: [facility];  Service: Ophthalmology;  Laterality: Left;  CDE:14.58  . CATARACT EXTRACTION W/PHACO  05/22/2011   Procedure: CATARACT EXTRACTION PHACO AND INTRAOCULAR LENS PLACEMENT (IOC);  Surgeon: [name], MD;  Location: [facility];  Service: Ophthalmology;  Laterality: Right;  CDE 13.02  . COLONOSCOPY  01/17/2011   Procedure: COLONOSCOPY;  Surgeon: Jamesetta So;  [facility];  Service: Gastroenterology;  Laterality: N/A;  . COLONOSCOPY N/A 02/24/2016   Procedure: COLONOSCOPY;  Surgeon: Rogene Houston, MD;  [facility];  Service: Endoscopy;  Laterality: N/A;  12:50  . INGUINAL HERNIA REPAIR Right 09/27/2015   Procedure: RIGHT INGUINAL HERNIORRHAPY WITH  MESH SMALL BOWEL RESECTION;  Surgeon: Aviva Signs, MD;  Location: [facility];  Service: General;  Laterality: Right;  . NM [place]  02/2009   dipyridamole; mild perfusion defect due to infarct/scar with mild perinfarct ischemia is apical septal, apical basal inferoseptal, basal inferior, mid inferoseptal mid inferior & apical inferior; EKG negative for ischemia; low risk scan  . PROSTATE SURGERY     7 years ago [facility] Dr Javaid-prostatectomy  . PROSTATECTOMY    . Sleep Study  05/2011   increased upper airway resistance syndrome with mild sleep apnea during REM  . TOTAL HIP ARTHROPLASTY Left 05/02/2016   Procedure: LEFT TOTAL HIP ARTHROPLASTY;  Surgeon: Marchia Bond, MD;  Location: [place];  Service: Orthopedics;  Laterality: Left;  . TRANSTHORACIC ECHOCARDIOGRAM  04/2011   EF=50-55%; RV mildly dilated; LA mild-mod dilated; mild mitral valve annular calcif, mild MR; mod TR, RVSP elevated 30-43mmHg, mild pulm HTN; AV mildly sclerotic, mild calcif of AV leaflets; trace pulm valve regurg     Allergies  Allergen Reactions  . No Known Allergies     Current Outpatient Prescriptions on  File Prior to Visit  Medication Sig Dispense Refill  . Calcium Carb-Cholecalciferol (CALCIUM 600+D3) 600-800 MG-UNIT TABS Take 1 tablet by mouth 2 (two) times daily.    . Cholecalciferol (VITAMIN D3) 5000 units CAPS Take 1 capsule (5,000 Units total) by mouth daily. 90 capsule 0  . ferrous sulfate (QC FERROUS SULFATE) 325 (65 FE) MG tablet Take 325 mg by mouth daily with breakfast.    . levothyroxine (SYNTHROID, LEVOTHROID) 75 MCG tablet Take 1 tablet (75 mcg total) by mouth daily before breakfast. 30 tablet 6  . metoprolol (LOPRESSOR) 50 MG tablet Take 1 tablet (50 mg total) by mouth 2 (two) times daily. 60 tablet 11  . sodium bicarbonate 650 MG tablet Take 650 mg by mouth 2 (two) times daily.    Alveda Reasons 15 MG TABS tablet TAKE ONE TABLET BY MOUTH ONCE DAILY WITH SUPPER 90 tablet 1   Current Facility-Administered Medications on File Prior to Visit  Medication Dose Route Frequency Provider Last Rate Last Dose  . neomycin-polymyxin-dexameth (MAXITROL) 0.1 % ophth ointment    PRN [name], MD   1 application at 46/80/32 1224        Objective:   Physical Exam Blood pressure 122/78, pulse 72, temperature 98.4 F (36.9 C), height 6' 2.5" (1.892 m), weight 264 lb 3.2 oz (119.8 kg). Alert and oriented. Skin warm and dry. Oral mucosa is moist.   . Sclera anicteric, conjunctivae is pink. Thyroid not enlarged. No cervical lymphadenopathy. Lungs clear. Heart regular rate and rhythm.  Abdomen is soft. Bowel sounds are positive. No hepatomegaly. No abdominal masses felt. No tenderness.              Assessment & Plan:  Rectal bleeding. Suspect diverticular bleed or hemorrhoidal. Will get a CBC today.

## 2016-06-27 ENCOUNTER — Other Ambulatory Visit (INDEPENDENT_AMBULATORY_CARE_PROVIDER_SITE_OTHER): Payer: Self-pay | Admitting: *Deleted

## 2016-06-27 DIAGNOSIS — K625 Hemorrhage of anus and rectum: Secondary | ICD-10-CM

## 2016-07-05 ENCOUNTER — Encounter (INDEPENDENT_AMBULATORY_CARE_PROVIDER_SITE_OTHER): Payer: Self-pay | Admitting: *Deleted

## 2016-07-05 ENCOUNTER — Other Ambulatory Visit (INDEPENDENT_AMBULATORY_CARE_PROVIDER_SITE_OTHER): Payer: Self-pay | Admitting: *Deleted

## 2016-07-05 DIAGNOSIS — K625 Hemorrhage of anus and rectum: Secondary | ICD-10-CM

## 2016-07-25 DIAGNOSIS — K625 Hemorrhage of anus and rectum: Secondary | ICD-10-CM | POA: Diagnosis not present

## 2016-07-25 LAB — CBC
HEMATOCRIT: 39.7 % (ref 38.5–50.0)
Hemoglobin: 13.5 g/dL (ref 13.2–17.1)
MCH: 32.1 pg (ref 27.0–33.0)
MCHC: 34 g/dL (ref 32.0–36.0)
MCV: 94.3 fL (ref 80.0–100.0)
MPV: 8.6 fL (ref 7.5–12.5)
PLATELETS: 249 10*3/uL (ref 140–400)
RBC: 4.21 MIL/uL (ref 4.20–5.80)
RDW: 14.2 % (ref 11.0–15.0)
WBC: 7.1 10*3/uL (ref 3.8–10.8)

## 2016-08-02 NOTE — Progress Notes (Signed)
Patient was given an appointment for 07/26/17 at 9:00am with Terri.  Patient was added to the recall list.

## 2016-08-03 DIAGNOSIS — E782 Mixed hyperlipidemia: Secondary | ICD-10-CM | POA: Diagnosis not present

## 2016-08-15 ENCOUNTER — Other Ambulatory Visit (HOSPITAL_COMMUNITY)
Admission: RE | Admit: 2016-08-15 | Discharge: 2016-08-15 | Disposition: A | Discharge status: Home / Self Care | Payer: PPO | Source: Other Acute Inpatient Hospital | Attending: Urology | Admitting: Urology

## 2016-08-15 ENCOUNTER — Ambulatory Visit (INDEPENDENT_AMBULATORY_CARE_PROVIDER_SITE_OTHER): Payer: PPO | Admitting: Urology

## 2016-08-15 DIAGNOSIS — N35014 Post-traumatic urethral stricture, male, unspecified: Secondary | ICD-10-CM | POA: Diagnosis not present

## 2016-08-15 DIAGNOSIS — I10 Essential (primary) hypertension: Secondary | ICD-10-CM | POA: Diagnosis not present

## 2016-08-15 DIAGNOSIS — Z8546 Personal history of malignant neoplasm of prostate: Secondary | ICD-10-CM | POA: Diagnosis not present

## 2016-08-15 DIAGNOSIS — Z79899 Other long term (current) drug therapy: Secondary | ICD-10-CM | POA: Diagnosis not present

## 2016-08-15 DIAGNOSIS — R8271 Bacteriuria: Secondary | ICD-10-CM | POA: Diagnosis not present

## 2016-08-15 DIAGNOSIS — N183 Chronic kidney disease, stage 3 (moderate): Secondary | ICD-10-CM | POA: Diagnosis not present

## 2016-08-15 DIAGNOSIS — R809 Proteinuria, unspecified: Secondary | ICD-10-CM | POA: Diagnosis not present

## 2016-08-15 DIAGNOSIS — C61 Malignant neoplasm of prostate: Secondary | ICD-10-CM

## 2016-08-15 DIAGNOSIS — D509 Iron deficiency anemia, unspecified: Secondary | ICD-10-CM | POA: Diagnosis not present

## 2016-08-15 DIAGNOSIS — E559 Vitamin D deficiency, unspecified: Secondary | ICD-10-CM | POA: Diagnosis not present

## 2016-08-18 LAB — URINE CULTURE: Culture: 80000 — AB

## 2016-08-22 DIAGNOSIS — I509 Heart failure, unspecified: Secondary | ICD-10-CM | POA: Diagnosis not present

## 2016-08-22 DIAGNOSIS — N183 Chronic kidney disease, stage 3 (moderate): Secondary | ICD-10-CM | POA: Diagnosis not present

## 2016-08-22 DIAGNOSIS — I10 Essential (primary) hypertension: Secondary | ICD-10-CM | POA: Diagnosis not present

## 2016-08-22 DIAGNOSIS — R809 Proteinuria, unspecified: Secondary | ICD-10-CM | POA: Diagnosis not present

## 2016-09-25 ENCOUNTER — Encounter: Payer: Self-pay | Admitting: Cardiovascular Disease

## 2016-09-25 ENCOUNTER — Ambulatory Visit (INDEPENDENT_AMBULATORY_CARE_PROVIDER_SITE_OTHER): Payer: PPO | Admitting: Cardiovascular Disease

## 2016-09-25 VITALS — BP 110/80 | HR 73 | Ht 74.5 in | Wt 266.0 lb

## 2016-09-25 DIAGNOSIS — N183 Chronic kidney disease, stage 3 unspecified: Secondary | ICD-10-CM

## 2016-09-25 DIAGNOSIS — R000 Tachycardia, unspecified: Secondary | ICD-10-CM

## 2016-09-25 DIAGNOSIS — I10 Essential (primary) hypertension: Secondary | ICD-10-CM

## 2016-09-25 DIAGNOSIS — E039 Hypothyroidism, unspecified: Secondary | ICD-10-CM | POA: Diagnosis not present

## 2016-09-25 DIAGNOSIS — Z7901 Long term (current) use of anticoagulants: Secondary | ICD-10-CM

## 2016-09-25 DIAGNOSIS — I951 Orthostatic hypotension: Secondary | ICD-10-CM

## 2016-09-25 DIAGNOSIS — I4819 Other persistent atrial fibrillation: Secondary | ICD-10-CM

## 2016-09-25 DIAGNOSIS — I481 Persistent atrial fibrillation: Secondary | ICD-10-CM | POA: Diagnosis not present

## 2016-09-25 DIAGNOSIS — I428 Other cardiomyopathies: Secondary | ICD-10-CM

## 2016-09-25 DIAGNOSIS — I43 Cardiomyopathy in diseases classified elsewhere: Secondary | ICD-10-CM | POA: Diagnosis not present

## 2016-09-25 NOTE — Progress Notes (Signed)
Cardiology Office Note    Date:  09/26/2016   ID:  [name] [name], DOB 10-22-1940, MRN [id number]  PCP:  [name], MD  Cardiologist:   [name], MD   Chief Complaint  Patient presents with  . Follow-up    no chest pain, occassional shortness of breath,no edema, no pain or caramping in legs, no lightheaded or dizziness    History of Present Illness:  [name] [name] is a 76 y.o. 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   He generally feels well. He walks a lap around the neighborhood 4 times every morning, taking rests in between the laps because of shortness of breath. This is similar to his chronic pattern. He attributes the shortness of breath to an episode of severe pneumonia he had a few years ago. His breathing never returned to normal since then.  He is unaware of palpitations and has not had dizziness or syncope. He denies any focal neurological events and has never had a stroke or TIA. He had some red blood in his stool about 2 months ago and had this checked out. It seems he does have hemorrhoidal bleeding. Blood counts were stable. This is stopped happening. He has not had any serious bleeding complications on Xarelto.  Kidney function labs performed in April showed improvement, he is due to see his nephrologist (Dr. Lowanda Foster) again in a couple of months. Hemoglobin was normal in July. His endocrinologist is Dr. Dorris Fetch, his next appointment in December  A nuclear stress test in 2011 showed reported scar with mild peri-infarct ischemia in the apex and the inferior wall. It was felt to be a low risk scan since no significant ischemia was demonstrated. In August 2017 left ventricular ejection fraction by echo was 50-55% without regional wall motion abnormalities. Both atria were mildly dilated. No  significant valvular abnormalities were reported. 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  Past Medical History:  Diagnosis Date  . Cardiomyopathy [facility])    h/o felt 2nd atrial fib, EF normal by echo 2013  . CHF (congestive heart failure) ([place])   . Chronic anticoagulation   . Chronic atrial fibrillation ([place])   . Chronic kidney disease (CKD), stage IV (severe) ([place])   . Dysrhythmia   . Hypothyroidism   . Pneumonia   . Pre-diabetes   . Primary localized osteoarthritis of left hip 05/02/2016  . Prostate cancer ([place])   . Shortness of breath   . Systemic hypertension     Past Surgical History:  Procedure Laterality Date  . CARDIOVERSION    . CATARACT EXTRACTION W/PHACO  04/17/2011   Procedure: CATARACT EXTRACTION PHACO AND INTRAOCULAR LENS PLACEMENT (IOC);  Surgeon: [name], MD;  [facility];  Service: Ophthalmology;  Laterality: Left;  CDE:14.58  . CATARACT EXTRACTION W/PHACO  05/22/2011   Procedure: CATARACT EXTRACTION PHACO AND INTRAOCULAR LENS PLACEMENT (IOC);  Surgeon: [name], MD;  [facility];  Service: Ophthalmology;  Laterality: Right;  CDE 13.02  . COLONOSCOPY  01/17/2011   Procedure: COLONOSCOPY;  Surgeon: Jamesetta So;  [facility];  Service: Gastroenterology;  Laterality: N/A;  . COLONOSCOPY N/A 02/24/2016   Procedure: COLONOSCOPY;  Surgeon: Rogene Houston, MD;  [facility];  Service:  Endoscopy;  Laterality: N/A;  12:50  . INGUINAL HERNIA REPAIR Right 09/27/2015   Procedure: RIGHT INGUINAL HERNIORRHAPY WITH MESH SMALL BOWEL RESECTION;  Surgeon: Aviva Signs, MD;  Location: [facility];  Service: General;  Laterality: Right;  . NM [place]  02/2009   dipyridamole; mild perfusion defect due to infarct/scar with mild perinfarct ischemia is apical septal, apical basal  inferoseptal, basal inferior, mid inferoseptal mid inferior & apical inferior; EKG negative for ischemia; low risk scan  . PROSTATE SURGERY     7 years ago [facility] Dr Javaid-prostatectomy  . PROSTATECTOMY    . Sleep Study  05/2011   increased upper airway resistance syndrome with mild sleep apnea during REM  . TOTAL HIP ARTHROPLASTY Left 05/02/2016   Procedure: LEFT TOTAL HIP ARTHROPLASTY;  Surgeon: Marchia Bond, MD;  Location: [place];  Service: Orthopedics;  Laterality: Left;  . TRANSTHORACIC ECHOCARDIOGRAM  04/2011   EF=50-55%; RV mildly dilated; LA mild-mod dilated; mild mitral valve annular calcif, mild MR; mod TR, RVSP elevated 30-14mmHg, mild pulm HTN; AV mildly sclerotic, mild calcif of AV leaflets; trace pulm valve regurg     Current Medications: Outpatient Medications Prior to Visit  Medication Sig Dispense Refill  . Calcium Carb-Cholecalciferol (CALCIUM 600+D3) 600-800 MG-UNIT TABS Take 1 tablet by mouth 2 (two) times daily.    . Cholecalciferol (VITAMIN D3) 5000 units CAPS Take 1 capsule (5,000 Units total) by mouth daily. 90 capsule 0  . ferrous sulfate (QC FERROUS SULFATE) 325 (65 FE) MG tablet Take 325 mg by mouth daily with breakfast.    . levothyroxine (SYNTHROID, LEVOTHROID) 75 MCG tablet Take 1 tablet (75 mcg total) by mouth daily before breakfast. 30 tablet 6  . metoprolol (LOPRESSOR) 50 MG tablet Take 1 tablet (50 mg total) by mouth 2 (two) times daily. 60 tablet 11  . sodium bicarbonate 650 MG tablet Take 650 mg by mouth 2 (two) times daily.    Alveda Reasons 15 MG TABS tablet TAKE ONE TABLET BY MOUTH ONCE DAILY WITH SUPPER 90 tablet 1  . baclofen (LIORESAL) 10 MG tablet Take 10 mg by mouth 3 (three) times daily.     Facility-Administered Medications Prior to Visit  Medication Dose Route Frequency Provider Last Rate Last Dose  . neomycin-polymyxin-dexameth (MAXITROL) 0.1 % ophth ointment    PRN [name], MD   1 application at 24/58/09 9833     Allergies:   No known allergies     Social History   Social History  . Marital status: Single    Spouse name: N/A  . Number of children: 2  . Years of education: N/A   Social History Main Topics  . Smoking status: Never Smoker  . Smokeless tobacco: Never Used  . Alcohol use No  . Drug use: No  . Sexual activity: Yes    Birth control/ protection: None   Other Topics Concern  . None   Social History Narrative  . None     Family History:  The patient's  family history includes Diabetes in his mother; Prostate cancer in his father.   ROS:   Please see the history of present illness.    ROS All other systems reviewed and are negative.   PHYSICAL EXAM:   VS:  BP 110/80   Pulse 73   Ht 6' 2.5" (1.892 m)   Wt 266 lb (120.7 kg)   BMI 33.70 kg/m     General: Alert, oriented x3, no distress, Mildly obese Head: no  evidence of trauma, PERRL, EOMI, no exophtalmos or lid lag, no myxedema, no xanthelasma; normal ears, nose and oropharynx Neck: normal jugular venous pulsations and no hepatojugular reflux; brisk carotid pulses without delay and no carotid bruits Chest: clear to auscultation, no signs of consolidation by percussion or palpation, normal fremitus, symmetrical and full respiratory excursions Cardiovascular: normal position and quality of the apical impulse, irregular rhythm, normal first and second heart sounds, no murmurs, rubs or gallops Abdomen: no tenderness or distention, no masses by palpation, no abnormal pulsatility or arterial bruits, normal bowel sounds, no hepatosplenomegaly Extremities: no clubbing, cyanosis or edema; 2+ radial, ulnar and brachial pulses bilaterally; 2+ right femoral, posterior tibial and dorsalis pedis pulses; 2+ left femoral, posterior tibial and dorsalis pedis pulses; no subclavian or femoral bruits Neurological: grossly nonfocal Psych: Normal mood and affect   Wt Readings from Last 3 Encounters:  09/25/16 266 lb (120.7 kg)  06/26/16 264 lb 3.2 oz (119.8 kg)  06/26/16  265 lb (120.2 kg)      Studies/Labs Reviewed:   EKG:  EKG is ordered today.  The ekg ordered today Shows atrial fibrillation with controlled rate and left anterior fascicular block, no repolarization abnormalities, QTC 431 ms  Recent Labs: 10/06/2015: Magnesium 1.9 10/28/2015: ALT 13 05/04/2016: BUN 26; Creatinine, Ser 2.45; Potassium 4.7; Sodium 135 06/21/2016: TSH 6.16 07/25/2016: Hemoglobin 13.5; Platelets 249   Lipid Panel    Component Value Date/Time   CHOL  02/04/2009 0538    142        ATP III CLASSIFICATION:  <200     mg/dL   Desirable  200-239  mg/dL   Borderline High  >=240    mg/dL   High          TRIG 122 02/04/2009 0538   HDL 33 (L) 02/04/2009 0538   CHOLHDL 4.3 02/04/2009 0538   VLDL 24 02/04/2009 0538   LDLCALC  02/04/2009 0538    85        Total Cholesterol/HDL:CHD Risk Coronary Heart Disease Risk Table                     Men   Women  1/2 Average Risk   3.4   3.3  Average Risk       5.0   4.4  2 X Average Risk   9.6   7.1  3 X Average Risk  23.4   11.0        Use the calculated Patient Ratio above and the CHD Risk Table to determine the patient's CHD Risk.        ATP III CLASSIFICATION (LDL):  <100     mg/dL   Optimal  100-129  mg/dL   Near or Above                    Optimal  130-159  mg/dL   Borderline  160-189  mg/dL   High  >190     mg/dL   Very High    September 2017  TSH 1.67 October 2017   normal liver function tests December 2017  creatinine 2.16, normal iron studies, hemoglobin 12.7   ASSESSMENT:    1. Persistent atrial fibrillation ([place])   2. 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 Essential hypertension   4. Orthostatic syncope   5. Long term current use of anticoagulant therapy   6. Acquired hypothyroidism   7. CKD (chronic kidney disease) stage 3, GFR 30-59 ml/min  PLAN:  In order of problems listed above:  1. AFib: Rate control appears to be very good. Amiodarone was weaned off starting in March and should be  pretty much out of his system by now. CHADSVasc 4 (age 7, HTN, HF). Rate control is critical in view of his history of tachycardia related cardiomyopathy 2. HTN: Excellent control.  3. history of orthostatic syncope: This improved after correction of his anemia, no recent recurrence. 4. Xarelto: No history of stroke/TIA. He has not had any serious bleeding complications other than hemorrhoidal bleeding, does have known diverticulosis without serious bleeding in the past. 5. Hypothyroidism: on appropriate dose of levothyroxine. Amiodarone effects should have resolved by now. 6. CKD stage 3-4: anticoagulant dose adjusted for poor renal function    Medication Adjustments/Labs and Tests Ordered: Current medicines are reviewed at length with the patient today.  Concerns regarding medicines are outlined above.  Medication changes, Labs and Tests ordered today are listed in the Patient Instructions below. Patient Instructions  Dr Sallyanne Kuster recommends that you schedule a follow-up appointment in 12 months. You will receive a reminder letter in the mail two months in advance. If you don't receive a letter, please call our office to schedule the follow-up appointment.  If you need a refill on your cardiac medications before your next appointment, please call your pharmacy.    Signed, [name], MD  09/26/2016 7:04 PM    [address], [place], [place]  [address] Phone: [phone]; Fax: [phone]

## 2016-09-25 NOTE — Patient Instructions (Signed)
Dr Croitoru recommends that you schedule a follow-up appointment in 12 months. You will receive a reminder letter in the mail two months in advance. If you don't receive a letter, please call our office to schedule the follow-up appointment.  If you need a refill on your cardiac medications before your next appointment, please call your pharmacy. 

## 2016-10-16 DIAGNOSIS — Z96642 Presence of left artificial hip joint: Secondary | ICD-10-CM | POA: Diagnosis not present

## 2016-11-26 ENCOUNTER — Other Ambulatory Visit: Payer: Self-pay | Admitting: Cardiovascular Disease

## 2016-12-11 DIAGNOSIS — N183 Chronic kidney disease, stage 3 (moderate): Secondary | ICD-10-CM | POA: Diagnosis not present

## 2016-12-11 DIAGNOSIS — Z79899 Other long term (current) drug therapy: Secondary | ICD-10-CM | POA: Diagnosis not present

## 2016-12-11 DIAGNOSIS — R809 Proteinuria, unspecified: Secondary | ICD-10-CM | POA: Diagnosis not present

## 2016-12-11 DIAGNOSIS — E559 Vitamin D deficiency, unspecified: Secondary | ICD-10-CM | POA: Diagnosis not present

## 2016-12-11 DIAGNOSIS — I10 Essential (primary) hypertension: Secondary | ICD-10-CM | POA: Diagnosis not present

## 2016-12-11 DIAGNOSIS — D509 Iron deficiency anemia, unspecified: Secondary | ICD-10-CM | POA: Diagnosis not present

## 2016-12-15 DIAGNOSIS — R7303 Prediabetes: Secondary | ICD-10-CM | POA: Diagnosis not present

## 2016-12-15 DIAGNOSIS — E212 Other hyperparathyroidism: Secondary | ICD-10-CM | POA: Diagnosis not present

## 2016-12-15 DIAGNOSIS — E039 Hypothyroidism, unspecified: Secondary | ICD-10-CM | POA: Diagnosis not present

## 2016-12-19 LAB — HEMOGLOBIN A1C
EAG (MMOL/L): 6.2 (calc)
Hgb A1c MFr Bld: 5.5 % of total Hgb (ref <5.7)
MEAN PLASMA GLUCOSE: 111 (calc)

## 2016-12-19 LAB — TSH: TSH: 2.44 mIU/L (ref 0.40–4.50)

## 2016-12-19 LAB — RENAL FUNCTION PANEL
Albumin: 3.8 g/dL (ref 3.6–5.1)
BUN/Creatinine Ratio: 12 (calc) (ref 6–22)
BUN: 26 mg/dL — AB (ref 7–25)
CHLORIDE: 103 mmol/L (ref 98–110)
CO2: 25 mmol/L (ref 20–32)
CREATININE: 2.21 mg/dL — AB (ref 0.70–1.18)
Calcium: 10.2 mg/dL (ref 8.6–10.3)
GLUCOSE: 103 mg/dL — AB (ref 65–99)
POTASSIUM: 4.5 mmol/L (ref 3.5–5.3)
Phosphorus: 3.4 mg/dL (ref 2.1–4.3)
SODIUM: 137 mmol/L (ref 135–146)

## 2016-12-19 LAB — PTH, INTACT AND CALCIUM
Calcium: 10.2 mg/dL (ref 8.6–10.3)
PTH: 16 pg/mL (ref 14–64)

## 2016-12-19 LAB — T4, FREE: Free T4: 1.1 ng/dL (ref 0.8–1.8)

## 2016-12-21 DIAGNOSIS — N183 Chronic kidney disease, stage 3 (moderate): Secondary | ICD-10-CM | POA: Diagnosis not present

## 2016-12-21 DIAGNOSIS — E872 Acidosis: Secondary | ICD-10-CM | POA: Diagnosis not present

## 2016-12-21 DIAGNOSIS — R809 Proteinuria, unspecified: Secondary | ICD-10-CM | POA: Diagnosis not present

## 2016-12-21 DIAGNOSIS — I10 Essential (primary) hypertension: Secondary | ICD-10-CM | POA: Diagnosis not present

## 2016-12-21 DIAGNOSIS — D509 Iron deficiency anemia, unspecified: Secondary | ICD-10-CM | POA: Diagnosis not present

## 2016-12-21 DIAGNOSIS — I509 Heart failure, unspecified: Secondary | ICD-10-CM | POA: Diagnosis not present

## 2016-12-25 ENCOUNTER — Encounter: Payer: Self-pay | Admitting: "Endocrinology

## 2016-12-25 ENCOUNTER — Ambulatory Visit (INDEPENDENT_AMBULATORY_CARE_PROVIDER_SITE_OTHER): Payer: PPO | Admitting: "Endocrinology

## 2016-12-25 VITALS — BP 130/83 | HR 85 | Ht 74.5 in | Wt 267.0 lb

## 2016-12-25 DIAGNOSIS — R7303 Prediabetes: Secondary | ICD-10-CM

## 2016-12-25 DIAGNOSIS — E559 Vitamin D deficiency, unspecified: Secondary | ICD-10-CM | POA: Diagnosis not present

## 2016-12-25 DIAGNOSIS — E039 Hypothyroidism, unspecified: Secondary | ICD-10-CM | POA: Diagnosis not present

## 2016-12-25 DIAGNOSIS — E212 Other hyperparathyroidism: Secondary | ICD-10-CM

## 2016-12-25 MED ORDER — VITAMIN D 50 MCG (2000 UT) PO CAPS: 1.0000 | ORAL_CAPSULE | Freq: Every day | ORAL | 11 refills | Status: DC

## 2016-12-25 NOTE — Progress Notes (Signed)
Subjective:    Patient ID: [name] [name], male    DOB: 05-08-1940, PCP [name], MD   Past Medical History:  Diagnosis Date  . Cardiomyopathy [facility])    h/o felt 2nd atrial fib, EF normal by echo 2013  . CHF (congestive heart failure) ([place])   . Chronic anticoagulation   . Chronic atrial fibrillation ([place])   . Chronic kidney disease (CKD), stage IV (severe) ([place])   . Dysrhythmia   . Hypothyroidism   . Pneumonia   . Pre-diabetes   . Primary localized osteoarthritis of left hip 05/02/2016  . Prostate cancer ([place])   . Shortness of breath   . Systemic hypertension    Past Surgical History:  Procedure Laterality Date  . CARDIOVERSION    . CATARACT EXTRACTION W/PHACO  04/17/2011   Procedure: CATARACT EXTRACTION PHACO AND INTRAOCULAR LENS PLACEMENT (IOC);  Surgeon: [name], MD;  Location: [facility];  Service: Ophthalmology;  Laterality: Left;  CDE:14.58  . CATARACT EXTRACTION W/PHACO  05/22/2011   Procedure: CATARACT EXTRACTION PHACO AND INTRAOCULAR LENS PLACEMENT (IOC);  Surgeon: [name], MD;  Location: [facility];  Service: Ophthalmology;  Laterality: Right;  CDE 13.02  . COLONOSCOPY  01/17/2011   Procedure: COLONOSCOPY;  Surgeon: Jamesetta So;  [facility];  Service: Gastroenterology;  Laterality: N/A;  . COLONOSCOPY N/A 02/24/2016   Procedure: COLONOSCOPY;  Surgeon: Rogene Houston, MD;  [facility];  Service: Endoscopy;  Laterality: N/A;  12:50  . INGUINAL HERNIA REPAIR Right 09/27/2015   Procedure: RIGHT INGUINAL HERNIORRHAPY WITH MESH SMALL BOWEL RESECTION;  Surgeon: Aviva Signs, MD;  Location: [facility];  Service: General;  Laterality: Right;  . NM MYOCAR [place]  02/2009   dipyridamole; mild perfusion defect due to infarct/scar with mild perinfarct ischemia is apical septal, apical basal inferoseptal, basal inferior, mid inferoseptal mid inferior & apical inferior; EKG negative for ischemia; low risk scan  . PROSTATE SURGERY     7 years ago  [facility] Dr Javaid-prostatectomy  . PROSTATECTOMY    . Sleep Study  05/2011   increased upper airway resistance syndrome with mild sleep apnea during REM  . TOTAL HIP ARTHROPLASTY Left 05/02/2016   Procedure: LEFT TOTAL HIP ARTHROPLASTY;  Surgeon: Marchia Bond, MD;  Location: [place];  Service: Orthopedics;  Laterality: Left;  . TRANSTHORACIC ECHOCARDIOGRAM  04/2011   EF=50-55%; RV mildly dilated; LA mild-mod dilated; mild mitral valve annular calcif, mild MR; mod TR, RVSP elevated 30-53mmHg, mild pulm HTN; AV mildly sclerotic, mild calcif of AV leaflets; trace pulm valve regurg    Social History   Socioeconomic History  . Marital status: Single    Spouse name: None  . Number of children: 2  . Years of education: None  . Highest education level: None  Social Needs  . Financial resource strain: None  . Food insecurity - worry: None  . Food insecurity - inability: None  . Transportation needs - medical: None  . Transportation needs - non-medical: None  Occupational History  . None  Tobacco Use  . Smoking status: Never Smoker  . Smokeless tobacco: Never Used  Substance and Sexual Activity  . Alcohol use: No  . Drug use: No  . Sexual activity: Yes    Birth control/protection: None  Other Topics Concern  . None  Social History Narrative  . None   Outpatient Encounter Medications as of 12/25/2016  Medication Sig  . Calcium Carb-Cholecalciferol (CALCIUM 600+D3) 600-800 MG-UNIT TABS  Take 1 tablet by mouth 2 (two) times daily.  . Cholecalciferol (VITAMIN D3) 5000 units CAPS Take 1 capsule (5,000 Units total) by mouth daily.  . ferrous sulfate (QC FERROUS SULFATE) 325 (65 FE) MG tablet Take 325 mg by mouth daily with breakfast.  . levothyroxine (SYNTHROID, LEVOTHROID) 75 MCG tablet Take 1 tablet (75 mcg total) by mouth daily before breakfast.  . metoprolol (LOPRESSOR) 50 MG tablet Take 1 tablet (50 mg total) by mouth 2 (two) times daily.  . sodium bicarbonate 650 MG tablet Take 650 mg by  mouth 2 (two) times daily.  Alveda Reasons 15 MG TABS tablet TAKE 1 TABLET BY MOUTH ONCE DAILY WITH SUPPER   Facility-Administered Encounter Medications as of 12/25/2016  Medication  . neomycin-polymyxin-dexameth (MAXITROL) 0.1 % ophth ointment   ALLERGIES: Allergies  Allergen Reactions  . No Known Allergies    VACCINATION STATUS:  There is no immunization history on file for this patient.  HPI  76 yr old male with above medical hx. 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   He was found to have primary hypothyroidism  approximate age of 60 years and was initiated on L levothyroxine, currently at 75 g by mouth every morning.   he is compliant, continues to feel better. He has no new complaints.  he denies cold/heat intolerance. - He is progressively gaining weigh. he denies hx of goiter , nor family hx of thyroid cancer. he survives prostate cancer. -he has history of tertiary hyperparathyroidism with PTH as high as 67 which is currently improving to 16 with supplement of calcium and vitamin D. His calcium is corrected at 10.2.  his prior 24 hour urine calcium has been  low at 57.   Review of Systems  Constitutional: + weight gain, no fatigue, no subjective hyperthermia/hypothermia Eyes: no blurry vision, no xerophthalmia ENT: no sore throat, no nodules palpated in throat, no dysphagia/odynophagia, no hoarseness Cardiovascular: no CP/SOB/palpitations/leg swelling Respiratory: no cough/SOB Gastrointestinal: no N/V/D/C Musculoskeletal: no muscle/joint aches Skin: no rashes Neurological: no tremors/numbness/tingling/dizziness Psychiatric: no depression/anxiety  Objective:    BP 130/83   Pulse 85   Ht 6' 2.5" (1.892 m)   Wt 267 lb (121.1 kg)   BMI 33.82 kg/m   Wt Readings from Last 3 Encounters:  12/25/16 267 lb (121.1 kg)  09/25/16 266 lb (120.7 kg)  06/26/16 264 lb 3.2 oz (119.8 kg)     Physical Exam   Constitutional: overweight, in NAD Eyes: PERRLA, EOMI, no exophthalmos ENT: moist mucous membranes, no thyromegaly, no cervical lymphadenopathy, poor dental condition. Cardiovascular: RRR, No MRG Respiratory: CTA B Gastrointestinal: abdomen soft, NT, ND, BS+ Musculoskeletal: no deformities, strength intact in all 4 Skin: moist, warm, no rashes Neurological: no tremor with outstretched hands, DTR normal in all 4  Results for orders placed or performed during the hospital encounter of 08/15/16  Culture, Urine  Result Value Ref Range   Specimen Description URINE, RANDOM    Special Requests NONE    Culture 80,000 COLONIES/mL ENTEROBACTER SPECIES (A)    Report Status 08/18/2016 FINAL    Organism ID, Bacteria ENTEROBACTER SPECIES (A)       Susceptibility   Enterobacter species - MIC*    CEFAZOLIN >=64 RESISTANT Resistant     CEFTRIAXONE >=64 RESISTANT Resistant     CIPROFLOXACIN 2 INTERMEDIATE Intermediate     GENTAMICIN <=1 SENSITIVE Sensitive     IMIPENEM 1 SENSITIVE Sensitive  NITROFURANTOIN 64 INTERMEDIATE Intermediate     TRIMETH/SULFA <=20 SENSITIVE Sensitive     PIP/TAZO >=128 RESISTANT Resistant     * 80,000 COLONIES/mL ENTEROBACTER SPECIES    Lipid Panel     Component Value Date/Time   CHOL  02/04/2009 0538    142        ATP III CLASSIFICATION:  <200     mg/dL   Desirable  200-239  mg/dL   Borderline High  >=240    mg/dL   High          TRIG 122 02/04/2009 0538   HDL 33 (L) 02/04/2009 0538   CHOLHDL 4.3 02/04/2009 0538   VLDL 24 02/04/2009 0538   LDLCALC  02/04/2009 0538    85        Total Cholesterol/HDL:CHD Risk Coronary Heart Disease Risk Table                     Men   Women  1/2 Average Risk   3.4   3.3  Average Risk       5.0   4.4  2 X Average Risk   9.6   7.1  3 X Average Risk  23.4   11.0        Use the calculated Patient Ratio above and the CHD Risk Table to determine the patient's CHD Risk.        ATP III CLASSIFICATION  (LDL):  <100     mg/dL   Optimal  100-129  mg/dL   Near or Above                    Optimal  130-159  mg/dL   Borderline  160-189  mg/dL   High  >190     mg/dL   Very High      Assessment & Plan:   1.  Hypothyroidism - His most recent thyroid function tests are consistent with  appropriate replacement with levothyroxine. I will  continue levothyroxine 75 g by mouth every morning.    - We discussed about correct intake of levothyroxine, at fasting, with water, separated by at least 30 minutes from breakfast, and separated by more than 4 hours from calcium, iron, multivitamins, acid reflux medications (PPIs). -Patient is made aware of the fact that thyroid hormone replacement is needed for life, dose to be adjusted by periodic monitoring of thyroid function tests.   2. Tertiary hyperparathyroidism    His last PTH is better at 16 from 58  along with calcium of   10.2.  -I advised him to continue daily supplement of calcium along with vitamin D. His prior 24 hour urine calcium has been  low at 57  3. Vitamin D deficiency: - His last vitamin D replete at 59. He just needs maintenance therapy.  -  He will be continued on daily vitamin D3 2000 units.  4. Hypertension: Controlled. I advised him to continue losartan and carvedilol.  5.  Obesity with prediabetes At risk for diabetes.  He has regained his weight that he lost status post surgery for small bowel obstruction.  He will not need treatment at this time. I gave him dietary advice to avoid simple carbs. Will check a1c next visit.  - I advised patient to maintain close follow up with [name], MD for primary care needs. Follow up plan: Return in about 6 months (around 06/25/2017) for follow up with pre-visit labs.  Glade Lloyd, MD Phone: [phone]  Fax: [phone]   -  This note was partially dictated with voice recognition software. Similar sounding words can be transcribed inadequately or may not  be corrected upon  review.  12/25/2016, 9:03 AM

## 2016-12-27 DIAGNOSIS — Z9841 Cataract extraction status, right eye: Secondary | ICD-10-CM | POA: Diagnosis not present

## 2016-12-27 DIAGNOSIS — H35371 Puckering of macula, right eye: Secondary | ICD-10-CM | POA: Diagnosis not present

## 2016-12-27 DIAGNOSIS — Z961 Presence of intraocular lens: Secondary | ICD-10-CM | POA: Diagnosis not present

## 2016-12-27 DIAGNOSIS — H43811 Vitreous degeneration, right eye: Secondary | ICD-10-CM | POA: Diagnosis not present

## 2017-01-28 ENCOUNTER — Other Ambulatory Visit: Payer: Self-pay | Admitting: "Endocrinology

## 2017-02-13 DIAGNOSIS — C61 Malignant neoplasm of prostate: Secondary | ICD-10-CM | POA: Diagnosis not present

## 2017-02-20 ENCOUNTER — Ambulatory Visit: Payer: PPO | Admitting: Urology

## 2017-02-20 DIAGNOSIS — R9721 Rising PSA following treatment for malignant neoplasm of prostate: Secondary | ICD-10-CM | POA: Diagnosis not present

## 2017-02-20 DIAGNOSIS — N35014 Post-traumatic urethral stricture, male, unspecified: Secondary | ICD-10-CM | POA: Diagnosis not present

## 2017-02-20 DIAGNOSIS — C61 Malignant neoplasm of prostate: Secondary | ICD-10-CM

## 2017-02-21 ENCOUNTER — Other Ambulatory Visit: Payer: Self-pay | Admitting: Urology

## 2017-02-21 DIAGNOSIS — R9721 Rising PSA following treatment for malignant neoplasm of prostate: Secondary | ICD-10-CM

## 2017-03-12 ENCOUNTER — Ambulatory Visit (HOSPITAL_COMMUNITY)
Admission: RE | Admit: 2017-03-12 | Discharge: 2017-03-12 | Disposition: A | Discharge status: Home / Self Care | Payer: PPO | Source: Ambulatory Visit | Attending: Urology | Admitting: Urology

## 2017-03-12 ENCOUNTER — Other Ambulatory Visit: Payer: Self-pay | Admitting: Urology

## 2017-03-12 DIAGNOSIS — I700 Atherosclerosis of aorta: Secondary | ICD-10-CM | POA: Diagnosis not present

## 2017-03-12 DIAGNOSIS — C7951 Secondary malignant neoplasm of bone: Secondary | ICD-10-CM | POA: Insufficient documentation

## 2017-03-12 DIAGNOSIS — R9721 Rising PSA following treatment for malignant neoplasm of prostate: Secondary | ICD-10-CM | POA: Diagnosis not present

## 2017-03-12 DIAGNOSIS — Z9079 Acquired absence of other genital organ(s): Secondary | ICD-10-CM | POA: Insufficient documentation

## 2017-03-12 DIAGNOSIS — N200 Calculus of kidney: Secondary | ICD-10-CM | POA: Insufficient documentation

## 2017-03-12 LAB — POCT I-STAT CREATININE: CREATININE: 2.2 mg/dL — AB (ref 0.61–1.24)

## 2017-03-12 MED ORDER — IOPAMIDOL (ISOVUE-300) INJECTION 61%: 100.0000 mL | Freq: Once | INTRAVENOUS | Status: DC | PRN

## 2017-03-12 NOTE — Progress Notes (Signed)
Verbal order to do CT A/P with out contrast due to elevated creatinine, Per Dr. Harrell Gave Lovena Neighbours

## 2017-03-13 ENCOUNTER — Encounter (INDEPENDENT_AMBULATORY_CARE_PROVIDER_SITE_OTHER): Payer: Self-pay | Admitting: Internal Medicine

## 2017-04-03 DIAGNOSIS — D509 Iron deficiency anemia, unspecified: Secondary | ICD-10-CM | POA: Diagnosis not present

## 2017-04-03 DIAGNOSIS — Z1159 Encounter for screening for other viral diseases: Secondary | ICD-10-CM | POA: Diagnosis not present

## 2017-04-03 DIAGNOSIS — R809 Proteinuria, unspecified: Secondary | ICD-10-CM | POA: Diagnosis not present

## 2017-04-03 DIAGNOSIS — E559 Vitamin D deficiency, unspecified: Secondary | ICD-10-CM | POA: Diagnosis not present

## 2017-04-03 DIAGNOSIS — Z79899 Other long term (current) drug therapy: Secondary | ICD-10-CM | POA: Diagnosis not present

## 2017-04-03 DIAGNOSIS — N183 Chronic kidney disease, stage 3 (moderate): Secondary | ICD-10-CM | POA: Diagnosis not present

## 2017-04-03 DIAGNOSIS — I10 Essential (primary) hypertension: Secondary | ICD-10-CM | POA: Diagnosis not present

## 2017-04-08 ENCOUNTER — Other Ambulatory Visit: Payer: Self-pay | Admitting: Cardiovascular Disease

## 2017-04-09 NOTE — Telephone Encounter (Signed)
Rx request sent to pharmacy.  

## 2017-04-11 ENCOUNTER — Other Ambulatory Visit: Payer: Self-pay | Admitting: Urology

## 2017-04-11 DIAGNOSIS — R9721 Rising PSA following treatment for malignant neoplasm of prostate: Secondary | ICD-10-CM

## 2017-04-12 ENCOUNTER — Other Ambulatory Visit: Payer: Self-pay | Admitting: Urology

## 2017-04-12 DIAGNOSIS — R9721 Rising PSA following treatment for malignant neoplasm of prostate: Secondary | ICD-10-CM

## 2017-04-16 ENCOUNTER — Encounter (HOSPITAL_COMMUNITY)
Admission: RE | Admit: 2017-04-16 | Discharge: 2017-04-16 | Disposition: A | Discharge status: Home / Self Care | Payer: PPO | Source: Ambulatory Visit | Attending: Urology | Admitting: Urology

## 2017-04-16 ENCOUNTER — Encounter (HOSPITAL_COMMUNITY): Payer: Self-pay

## 2017-04-16 DIAGNOSIS — R9721 Rising PSA following treatment for malignant neoplasm of prostate: Secondary | ICD-10-CM | POA: Insufficient documentation

## 2017-04-16 DIAGNOSIS — R948 Abnormal results of function studies of other organs and systems: Secondary | ICD-10-CM | POA: Insufficient documentation

## 2017-04-16 DIAGNOSIS — C61 Malignant neoplasm of prostate: Secondary | ICD-10-CM | POA: Diagnosis not present

## 2017-04-16 MED ORDER — TECHNETIUM TC 99M MEDRONATE IV KIT
20.0000 | PACK | Freq: Once | INTRAVENOUS | Status: AC | PRN
  Administered 2017-04-16: 21.3 via INTRAVENOUS

## 2017-04-19 ENCOUNTER — Other Ambulatory Visit (HOSPITAL_COMMUNITY): Payer: PPO

## 2017-04-30 DIAGNOSIS — Z1389 Encounter for screening for other disorder: Secondary | ICD-10-CM | POA: Diagnosis not present

## 2017-04-30 DIAGNOSIS — Z0001 Encounter for general adult medical examination with abnormal findings: Secondary | ICD-10-CM | POA: Diagnosis not present

## 2017-04-30 DIAGNOSIS — I4891 Unspecified atrial fibrillation: Secondary | ICD-10-CM | POA: Diagnosis not present

## 2017-04-30 DIAGNOSIS — E872 Acidosis: Secondary | ICD-10-CM | POA: Diagnosis not present

## 2017-04-30 DIAGNOSIS — M9080 Osteopathy in diseases classified elsewhere, unspecified site: Secondary | ICD-10-CM | POA: Diagnosis not present

## 2017-04-30 DIAGNOSIS — I10 Essential (primary) hypertension: Secondary | ICD-10-CM | POA: Diagnosis not present

## 2017-04-30 DIAGNOSIS — E782 Mixed hyperlipidemia: Secondary | ICD-10-CM | POA: Diagnosis not present

## 2017-04-30 DIAGNOSIS — N183 Chronic kidney disease, stage 3 (moderate): Secondary | ICD-10-CM | POA: Diagnosis not present

## 2017-04-30 DIAGNOSIS — Z23 Encounter for immunization: Secondary | ICD-10-CM | POA: Diagnosis not present

## 2017-04-30 DIAGNOSIS — R809 Proteinuria, unspecified: Secondary | ICD-10-CM | POA: Diagnosis not present

## 2017-04-30 DIAGNOSIS — Z6838 Body mass index (BMI) 38.0-38.9, adult: Secondary | ICD-10-CM | POA: Diagnosis not present

## 2017-04-30 DIAGNOSIS — E889 Metabolic disorder, unspecified: Secondary | ICD-10-CM | POA: Diagnosis not present

## 2017-05-07 ENCOUNTER — Ambulatory Visit (HOSPITAL_COMMUNITY)
Admission: RE | Admit: 2017-05-07 | Discharge: 2017-05-07 | Disposition: A | Discharge status: Home / Self Care | Payer: PPO | Source: Ambulatory Visit | Attending: Urology | Admitting: Urology

## 2017-05-07 DIAGNOSIS — K449 Diaphragmatic hernia without obstruction or gangrene: Secondary | ICD-10-CM | POA: Diagnosis not present

## 2017-05-07 DIAGNOSIS — R9721 Rising PSA following treatment for malignant neoplasm of prostate: Secondary | ICD-10-CM | POA: Insufficient documentation

## 2017-05-07 DIAGNOSIS — C7951 Secondary malignant neoplasm of bone: Secondary | ICD-10-CM | POA: Diagnosis not present

## 2017-05-07 DIAGNOSIS — I700 Atherosclerosis of aorta: Secondary | ICD-10-CM | POA: Diagnosis not present

## 2017-05-07 DIAGNOSIS — N200 Calculus of kidney: Secondary | ICD-10-CM | POA: Diagnosis not present

## 2017-05-07 DIAGNOSIS — I2510 Atherosclerotic heart disease of native coronary artery without angina pectoris: Secondary | ICD-10-CM | POA: Diagnosis not present

## 2017-05-07 DIAGNOSIS — C61 Malignant neoplasm of prostate: Secondary | ICD-10-CM | POA: Diagnosis not present

## 2017-05-07 DIAGNOSIS — Z96642 Presence of left artificial hip joint: Secondary | ICD-10-CM | POA: Diagnosis not present

## 2017-05-15 ENCOUNTER — Ambulatory Visit: Payer: PPO | Admitting: Urology

## 2017-05-15 DIAGNOSIS — R9721 Rising PSA following treatment for malignant neoplasm of prostate: Secondary | ICD-10-CM | POA: Diagnosis not present

## 2017-05-15 DIAGNOSIS — C7951 Secondary malignant neoplasm of bone: Secondary | ICD-10-CM

## 2017-05-15 DIAGNOSIS — C61 Malignant neoplasm of prostate: Secondary | ICD-10-CM

## 2017-05-16 DIAGNOSIS — C61 Malignant neoplasm of prostate: Secondary | ICD-10-CM | POA: Diagnosis not present

## 2017-05-28 ENCOUNTER — Other Ambulatory Visit: Payer: Self-pay | Admitting: Cardiovascular Disease

## 2017-05-30 ENCOUNTER — Encounter: Payer: Self-pay | Admitting: Cardiovascular Disease

## 2017-05-30 ENCOUNTER — Telehealth: Payer: Self-pay

## 2017-05-30 NOTE — Telephone Encounter (Signed)
   [facility] Pre-operative Risk Assessment    Request for surgical clearance:  1. What type of surgery is being performed? Removing 7 teeth   2. When is this surgery scheduled? pending   3. What type of clearance is required (medical clearance vs. Pharmacy clearance to hold med vs. Both)? pharmacy  4. Are there any medications that need to be held prior to surgery and how long? xarelto   5. Practice name and name of physician performing surgery? Dr Franchot Gallo   6. What is your office phone number? [phone]    7.   What is your office fax number? [phone]  8.   Anesthesia type (None, local, MAC, general)? local   [name], [name] 05/30/2017, 10:23 AM  _________________________________________________________________   (provider comments below)

## 2017-05-30 NOTE — Telephone Encounter (Signed)
Please see letter in epic  [name], MD, [facility] [phone] office [phone] pager

## 2017-06-18 ENCOUNTER — Other Ambulatory Visit: Payer: Self-pay | Admitting: "Endocrinology

## 2017-06-18 DIAGNOSIS — E039 Hypothyroidism, unspecified: Secondary | ICD-10-CM | POA: Diagnosis not present

## 2017-06-18 DIAGNOSIS — E559 Vitamin D deficiency, unspecified: Secondary | ICD-10-CM | POA: Diagnosis not present

## 2017-06-18 DIAGNOSIS — E212 Other hyperparathyroidism: Secondary | ICD-10-CM | POA: Diagnosis not present

## 2017-06-19 LAB — T4, FREE: Free T4: 1.14 ng/dL (ref 0.82–1.77)

## 2017-06-19 LAB — TSH: TSH: 3.57 u[IU]/mL (ref 0.450–4.500)

## 2017-06-19 LAB — VITAMIN D 25 HYDROXY (VIT D DEFICIENCY, FRACTURES): Vit D, 25-Hydroxy: 50.6 ng/mL (ref 30.0–100.0)

## 2017-06-19 LAB — PARATHYROID HORMONE, INTACT (NO CA): PTH: 29 pg/mL (ref 15–65)

## 2017-06-25 ENCOUNTER — Encounter: Payer: Self-pay | Admitting: "Endocrinology

## 2017-06-25 ENCOUNTER — Ambulatory Visit (INDEPENDENT_AMBULATORY_CARE_PROVIDER_SITE_OTHER): Payer: PPO | Admitting: "Endocrinology

## 2017-06-25 VITALS — BP 119/82 | HR 78 | Wt 273.0 lb

## 2017-06-25 DIAGNOSIS — E212 Other hyperparathyroidism: Secondary | ICD-10-CM | POA: Diagnosis not present

## 2017-06-25 DIAGNOSIS — E559 Vitamin D deficiency, unspecified: Secondary | ICD-10-CM | POA: Diagnosis not present

## 2017-06-25 DIAGNOSIS — E039 Hypothyroidism, unspecified: Secondary | ICD-10-CM | POA: Diagnosis not present

## 2017-06-25 MED ORDER — LEVOTHYROXINE SODIUM 88 MCG PO TABS: 88.0000 ug | ORAL_TABLET | Freq: Every day | ORAL | 6 refills | Status: DC

## 2017-06-25 MED ORDER — LEVOTHYROXINE SODIUM 88 MCG PO TABS: 88.0000 ug | ORAL_TABLET | Freq: Every day | ORAL | 1 refills | Status: DC

## 2017-06-25 NOTE — Progress Notes (Signed)
Subjective:    Patient ID: [name] [name], male    DOB: 02-25-1940, PCP [name], MD   Past Medical History:  Diagnosis Date  . Cardiomyopathy [name]    h/o felt 2nd atrial fib, EF normal by echo 2013  . CHF (congestive heart failure) ([place])   . Chronic anticoagulation   . Chronic atrial fibrillation ([place])   . Chronic kidney disease (CKD), stage IV (severe) ([place])   . Dysrhythmia   . Hypothyroidism   . Pneumonia   . Pre-diabetes   . Primary localized osteoarthritis of left hip 05/02/2016  . Prostate cancer ([place])   . Shortness of breath   . Systemic hypertension    Past Surgical History:  Procedure Laterality Date  . CARDIOVERSION    . CATARACT EXTRACTION W/PHACO  04/17/2011   Procedure: CATARACT EXTRACTION PHACO AND INTRAOCULAR LENS PLACEMENT (IOC);  Surgeon: [name], MD;  Location: [facility];  Service: Ophthalmology;  Laterality: Left;  CDE:14.58  . CATARACT EXTRACTION W/PHACO  05/22/2011   Procedure: CATARACT EXTRACTION PHACO AND INTRAOCULAR LENS PLACEMENT (IOC);  Surgeon: [name], MD;  Location: [facility];  Service: Ophthalmology;  Laterality: Right;  CDE 13.02  . COLONOSCOPY  01/17/2011   Procedure: COLONOSCOPY;  Surgeon: Jamesetta So;  [facility];  Service: Gastroenterology;  Laterality: N/A;  . COLONOSCOPY N/A 02/24/2016   Procedure: COLONOSCOPY;  Surgeon: Rogene Houston, MD;  [facility];  Service: Endoscopy;  Laterality: N/A;  12:50  . INGUINAL HERNIA REPAIR Right 09/27/2015   Procedure: RIGHT INGUINAL HERNIORRHAPY WITH MESH SMALL BOWEL RESECTION;  Surgeon: Aviva Signs, MD;  Location: [facility];  Service: General;  Laterality: Right;  . NM MYOCAR [place]  02/2009   dipyridamole; mild perfusion defect due to infarct/scar with mild perinfarct ischemia is apical septal, apical basal inferoseptal, basal inferior, mid inferoseptal mid inferior & apical inferior; EKG negative for ischemia; low risk scan  . PROSTATE SURGERY     7 years ago  [facility] Dr Javaid-prostatectomy  . PROSTATECTOMY    . Sleep Study  05/2011   increased upper airway resistance syndrome with mild sleep apnea during REM  . TOTAL HIP ARTHROPLASTY Left 05/02/2016   Procedure: LEFT TOTAL HIP ARTHROPLASTY;  Surgeon: Marchia Bond, MD;  Location: [place];  Service: Orthopedics;  Laterality: Left;  . TRANSTHORACIC ECHOCARDIOGRAM  04/2011   EF=50-55%; RV mildly dilated; LA mild-mod dilated; mild mitral valve annular calcif, mild MR; mod TR, RVSP elevated 30-96mmHg, mild pulm HTN; AV mildly sclerotic, mild calcif of AV leaflets; trace pulm valve regurg    Social History   Socioeconomic History  . Marital status: Single    Spouse name: Not on file  . Number of children: 2  . Years of education: Not on file  . Highest education level: Not on file  Occupational History  . Not on file  Social Needs  . Financial resource strain: Not on file  . Food insecurity:    Worry: Not on file    Inability: Not on file  . Transportation needs:    Medical: Not on file    Non-medical: Not on file  Tobacco Use  . Smoking status: Never Smoker  . Smokeless tobacco: Never Used  Substance and Sexual Activity  . Alcohol use: No  . Drug use: No  . Sexual activity: Yes    Birth control/protection: None  Lifestyle  . Physical activity:    Days per week: Not on file  Minutes per session: Not on file  . Stress: Not on file  Relationships  . Social connections:    Talks on phone: Not on file    Gets together: Not on file    Attends religious service: Not on file    Active member of club or organization: Not on file    Attends meetings of clubs or organizations: Not on file    Relationship status: Not on file  Other Topics Concern  . Not on file  Social History Narrative  . Not on file   Outpatient Encounter Medications as of 06/25/2017  Medication Sig  . [DISCONTINUED] Cholecalciferol (VITAMIN D3) 5000 units CAPS Take by mouth daily.  . Calcium Carb-Cholecalciferol  (CALCIUM 600+D3) 600-800 MG-UNIT TABS Take 1 tablet by mouth 2 (two) times daily.  . ferrous sulfate (QC FERROUS SULFATE) 325 (65 FE) MG tablet Take 325 mg by mouth daily with breakfast.  . levothyroxine (SYNTHROID, LEVOTHROID) 88 MCG tablet Take 1 tablet (88 mcg total) by mouth daily before breakfast.  . metoprolol tartrate (LOPRESSOR) 50 MG tablet TAKE 1 TABLET BY MOUTH TWICE DAILY  . sodium bicarbonate 650 MG tablet Take 650 mg by mouth 2 (two) times daily.  Alveda Reasons 15 MG TABS tablet TAKE 1 TABLET BY MOUTH ONCE DAILY WITH SUPPER  . [DISCONTINUED] Cholecalciferol (VITAMIN D) 2000 units CAPS Take 1 capsule (2,000 Units total) by mouth daily.  . [DISCONTINUED] levothyroxine (SYNTHROID, LEVOTHROID) 75 MCG tablet TAKE 1 TABLET BY MOUTH ONCE DAILY BEFORE BREAKFAST  . [DISCONTINUED] levothyroxine (SYNTHROID, LEVOTHROID) 88 MCG tablet Take 1 tablet (88 mcg total) by mouth daily before breakfast.   Facility-Administered Encounter Medications as of 06/25/2017  Medication  . neomycin-polymyxin-dexameth (MAXITROL) 0.1 % ophth ointment   ALLERGIES: Allergies  Allergen Reactions  . No Known Allergies    VACCINATION STATUS:  There is no immunization history on file for this patient.  HPI  77 yr old male with above medical history. 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   He was found to have primary hypothyroidism  approximate at age of 31 years and was initiated on L levothyroxine, currently at 75 g by mouth every morning.   -He is compliant, continues to feel better. He has no new complaints.  he denies cold/heat intolerance. - He is progressively gaining weight.  -He denies history of goiter, nor family history of thyroid cancer.  -  he survives prostate cancer. -he has history of tertiary hyperparathyroidism with PTH as high as 63 which is currently improving to 23 with supplement of calcium and vitamin  D. His most recent calcium is corrected at 10.2 along with PTH of 29.   his prior 24 hour urine calcium has been  low at 57.   Review of Systems  Constitutional: + Weight gain, no fatigue, no subjective hyperthermia/hypothermia Eyes: no blurry vision, no xerophthalmia ENT: no sore throat, no nodules palpated in throat, no dysphagia, no odynophagia.   Cardiovascular: no chest pain, no palpitations.   Respiratory: no cough/SOB Gastrointestinal: no N/V/D/C Musculoskeletal: no muscle/joint aches Skin: no rashes Neurological: no tremors, no tingling.   Psychiatric: no depression/anxiety  Objective:    BP 119/82   Pulse 78   Wt 273 lb (123.8 kg)   BMI 34.58 kg/m   Wt Readings from Last 3 Encounters:  06/25/17 273 lb (123.8 kg)  12/25/16 267 lb (121.1 kg)  09/25/16 266 lb (120.7 kg)    Physical  Exam   Constitutional: + Obese, not in acute distress.  Eyes: PERRLA, EOMI, no exophthalmos ENT: moist mucous membranes, poor dental condition, no thyromegaly, no cervical lymphadenopathy, poor dental condition. Musculoskeletal: no deformities, strength intact in all 4 Skin: moist, warm, no rashes Neurological: no tremor with outstretched hand  Results for orders placed or performed in visit on 06/18/17  T4, free  Result Value Ref Range   Free T4 1.14 0.82 - 1.77 ng/dL  TSH  Result Value Ref Range   TSH 3.570 0.450 - 4.500 uIU/mL  VITAMIN D 25 Hydroxy (Vit-D Deficiency, Fractures)  Result Value Ref Range   Vit D, 25-Hydroxy 50.6 30.0 - 100.0 ng/mL  Parathyroid hormone, intact (no Ca)  Result Value Ref Range   PTH 29 15 - 65 pg/mL    Lipid Panel     Component Value Date/Time   CHOL  02/04/2009 0538    142        ATP III CLASSIFICATION:  <200     mg/dL   Desirable  200-239  mg/dL   Borderline High  >=240    mg/dL   High          TRIG 122 02/04/2009 0538   HDL 33 (L) 02/04/2009 0538   CHOLHDL 4.3 02/04/2009 0538   VLDL 24 02/04/2009 0538   LDLCALC  02/04/2009 0538     85        Total Cholesterol/HDL:CHD Risk Coronary Heart Disease Risk Table                     Men   Women  1/2 Average Risk   3.4   3.3  Average Risk       5.0   4.4  2 X Average Risk   9.6   7.1  3 X Average Risk  23.4   11.0        Use the calculated Patient Ratio above and the CHD Risk Table to determine the patient's CHD Risk.        ATP III CLASSIFICATION (LDL):  <100     mg/dL   Optimal  100-129  mg/dL   Near or Above                    Optimal  130-159  mg/dL   Borderline  160-189  mg/dL   High  >190     mg/dL   Very High      Assessment & Plan:   1.  Hypothyroidism -Based on his most recent thyroid function test, he would benefit from slight increase in his levothyroxine dose. -I discussed and increase his levothyroxine to 88 mcg p.o. every morning.     - We discussed about correct intake of levothyroxine, at fasting, with water, separated by at least 30 minutes from breakfast, and separated by more than 4 hours from calcium, iron, multivitamins, acid reflux medications (PPIs). -Patient is made aware of the fact that thyroid hormone replacement is needed for life, dose to be adjusted by periodic monitoring of thyroid function tests.   2. Tertiary hyperparathyroidism    His last PTH is stable at 68 with calcium of 10.2.  He is now vitamin D replete at 29.  He is advised to maintain with over-the-counter vitamin D and calcium supplements.  His prior 24 hour urine calcium has been  low at 57  3. Hypertension: Controlled. I advised him to continue losartan and carvedilol.  5.  Obesity with prediabetes At  risk for diabetes.  He has regained his weight that he lost status post surgery for small bowel obstruction.  He will not need treatment at this time. I gave him dietary advice to avoid simple carbs. Will check a1c next visit.  - I advised patient to maintain close follow up with [name], MD for primary care needs. Follow up plan: Return in about 6 months  (around 12/25/2017) for follow up with pre-visit labs.  Glade Lloyd, MD Phone: [phone]  Fax: [phone]   -  This note was partially dictated with voice recognition software. Similar sounding words can be transcribed inadequately or may not  be corrected upon review.  06/25/2017, 12:33 PM

## 2017-07-03 ENCOUNTER — Ambulatory Visit: Payer: PPO | Admitting: Urology

## 2017-07-03 DIAGNOSIS — C7951 Secondary malignant neoplasm of bone: Secondary | ICD-10-CM

## 2017-07-03 DIAGNOSIS — R9721 Rising PSA following treatment for malignant neoplasm of prostate: Secondary | ICD-10-CM

## 2017-07-03 DIAGNOSIS — C61 Malignant neoplasm of prostate: Secondary | ICD-10-CM | POA: Diagnosis not present

## 2017-07-26 ENCOUNTER — Ambulatory Visit (INDEPENDENT_AMBULATORY_CARE_PROVIDER_SITE_OTHER): Payer: PPO | Admitting: Internal Medicine

## 2017-08-01 ENCOUNTER — Ambulatory Visit (INDEPENDENT_AMBULATORY_CARE_PROVIDER_SITE_OTHER): Payer: PPO | Admitting: Internal Medicine

## 2017-08-31 DIAGNOSIS — I10 Essential (primary) hypertension: Secondary | ICD-10-CM | POA: Diagnosis not present

## 2017-08-31 DIAGNOSIS — R809 Proteinuria, unspecified: Secondary | ICD-10-CM | POA: Diagnosis not present

## 2017-08-31 DIAGNOSIS — N183 Chronic kidney disease, stage 3 (moderate): Secondary | ICD-10-CM | POA: Diagnosis not present

## 2017-08-31 DIAGNOSIS — E559 Vitamin D deficiency, unspecified: Secondary | ICD-10-CM | POA: Diagnosis not present

## 2017-08-31 DIAGNOSIS — D509 Iron deficiency anemia, unspecified: Secondary | ICD-10-CM | POA: Diagnosis not present

## 2017-08-31 DIAGNOSIS — Z79899 Other long term (current) drug therapy: Secondary | ICD-10-CM | POA: Diagnosis not present

## 2017-09-04 ENCOUNTER — Ambulatory Visit (INDEPENDENT_AMBULATORY_CARE_PROVIDER_SITE_OTHER): Payer: PPO | Admitting: Urology

## 2017-09-04 DIAGNOSIS — C61 Malignant neoplasm of prostate: Secondary | ICD-10-CM | POA: Diagnosis not present

## 2017-09-12 DIAGNOSIS — E669 Obesity, unspecified: Secondary | ICD-10-CM | POA: Diagnosis not present

## 2017-09-12 DIAGNOSIS — N183 Chronic kidney disease, stage 3 (moderate): Secondary | ICD-10-CM | POA: Diagnosis not present

## 2017-09-12 DIAGNOSIS — I509 Heart failure, unspecified: Secondary | ICD-10-CM | POA: Diagnosis not present

## 2017-09-12 DIAGNOSIS — D509 Iron deficiency anemia, unspecified: Secondary | ICD-10-CM | POA: Diagnosis not present

## 2017-09-12 DIAGNOSIS — I10 Essential (primary) hypertension: Secondary | ICD-10-CM | POA: Diagnosis not present

## 2017-09-12 DIAGNOSIS — E872 Acidosis: Secondary | ICD-10-CM | POA: Diagnosis not present

## 2017-09-12 DIAGNOSIS — R809 Proteinuria, unspecified: Secondary | ICD-10-CM | POA: Diagnosis not present

## 2017-10-05 ENCOUNTER — Ambulatory Visit (INDEPENDENT_AMBULATORY_CARE_PROVIDER_SITE_OTHER): Payer: PPO | Admitting: Urology

## 2017-10-05 DIAGNOSIS — C7951 Secondary malignant neoplasm of bone: Secondary | ICD-10-CM | POA: Diagnosis not present

## 2017-10-05 DIAGNOSIS — C61 Malignant neoplasm of prostate: Secondary | ICD-10-CM

## 2017-10-05 DIAGNOSIS — R9721 Rising PSA following treatment for malignant neoplasm of prostate: Secondary | ICD-10-CM

## 2017-11-06 ENCOUNTER — Ambulatory Visit: Payer: PPO | Admitting: Urology

## 2017-11-06 DIAGNOSIS — C7951 Secondary malignant neoplasm of bone: Secondary | ICD-10-CM | POA: Diagnosis not present

## 2017-11-06 DIAGNOSIS — Z79899 Other long term (current) drug therapy: Secondary | ICD-10-CM

## 2017-11-06 DIAGNOSIS — R9721 Rising PSA following treatment for malignant neoplasm of prostate: Secondary | ICD-10-CM | POA: Diagnosis not present

## 2017-11-06 DIAGNOSIS — C61 Malignant neoplasm of prostate: Secondary | ICD-10-CM | POA: Diagnosis not present

## 2017-11-22 ENCOUNTER — Other Ambulatory Visit: Payer: Self-pay | Admitting: Cardiovascular Disease

## 2017-11-26 ENCOUNTER — Ambulatory Visit: Payer: PPO | Admitting: Physician Assistant

## 2017-11-26 ENCOUNTER — Encounter: Payer: Self-pay | Admitting: Physician Assistant

## 2017-11-26 VITALS — BP 106/78 | HR 93 | Ht 74.5 in | Wt 272.4 lb

## 2017-11-26 DIAGNOSIS — I482 Chronic atrial fibrillation, unspecified: Secondary | ICD-10-CM | POA: Diagnosis not present

## 2017-11-26 DIAGNOSIS — I10 Essential (primary) hypertension: Secondary | ICD-10-CM

## 2017-11-26 DIAGNOSIS — E039 Hypothyroidism, unspecified: Secondary | ICD-10-CM

## 2017-11-26 DIAGNOSIS — R7303 Prediabetes: Secondary | ICD-10-CM | POA: Diagnosis not present

## 2017-11-26 DIAGNOSIS — N184 Chronic kidney disease, stage 4 (severe): Secondary | ICD-10-CM

## 2017-11-26 NOTE — Patient Instructions (Signed)
Medication Instructions:  Your physician recommends that you continue on your current medications as directed. Please refer to the Current Medication list given to you today.  If you need a refill on your cardiac medications before your next appointment, please call your pharmacy.   Lab work: None  If you have labs (blood work) drawn today and your tests are completely normal, you will receive your results only by: Marland Kitchen MyChart Message (if you have MyChart) OR . A paper copy in the mail If you have any lab test that is abnormal or we need to change your treatment, we will call you to review the results.  Testing/Procedures: None   Follow-Up: At [facility], you and your health needs are our priority.  As part of our continuing mission to provide you with exceptional heart care, we have created designated Provider Care Teams.  These Care Teams include your primary Cardiologist (physician) and Advanced Practice Providers (APPs -  Physician Assistants and Nurse Practitioners) who all work together to provide you with the care you need, when you need it. You will need a follow up appointment in 12 months.  Please call our office 2 months in advance to schedule this appointment.  You may see Dr [name] or one of the following Advanced Practice Providers on your designated Care Team: [place], [place] . Fabian Sharp, PA-C  Any Other Special Instructions Will Be Listed Below (If Applicable).

## 2017-11-26 NOTE — Progress Notes (Signed)
Cardiology Office Note    Date:  11/26/2017   ID:  [name] [name], DOB 1940/05/02, MRN [id number]  PCP:  [name], MD  Cardiologist:  Dr. Sallyanne Kuster  Nephrologist: Dr. Lowanda Foster Endocrinologist: Dr. Dorris Fetch   Chief Complaint  Patient presents with  . Follow-up    History of Present Illness:  [name] [name] is a 77 y.o. male with PMH of chronic atrial fibrillation, previous history of tachycardia mediated cardiomyopathy with improved EF, CKD stage IV, hypothyroidism, prediabetes, and hypertension.  He had a nuclear stress test in 2011 that reportedly showed scar was peri-infarct ischemia in the apex and inferior wall, this was felt to be low risk scan since no significant ischemia was demonstrated.  His EF was normal by echo in 2013.  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  Echocardiogram obtained in August 2017 showed EF 50 to 55% without regional wall motion abnormality, mildly dilated atria on both side, no significant valvular disease.  Patient was last seen by Dr. Sallyanne Kuster on 09/25/2016, he was doing well at the time.  He used to be on amiodarone for his atrial fibrillation, this was stopped in March 2018.  Patient presents today for cardiology office visit.  He has been doing well from her perspective.  His atrial fibrillation is well controlled on current medication.  He does not have significant cardiac awareness.  Blood pressure is also well controlled on current therapy.  Heart rate is near upper border normal, however blood pressure does not allow me to uptitrate his rate control therapy.  He has no significant lower extremity edema, orthopnea or PND.  I will fill out his medication assistance form for the Xarelto for next year.   Past Medical History:  Diagnosis Date  . Cardiomyopathy [facility])    h/o felt 2nd atrial fib, EF normal by echo 2013  . CHF (congestive heart failure) ([place])   . Chronic anticoagulation   . Chronic  atrial fibrillation   . Chronic kidney disease (CKD), stage IV (severe) ([place])   . Dysrhythmia   . Hypothyroidism   . Pneumonia   . Pre-diabetes   . Primary localized osteoarthritis of left hip 05/02/2016  . Prostate cancer ([place])   . Shortness of breath   . Systemic hypertension     Past Surgical History:  Procedure Laterality Date  . CARDIOVERSION    . CATARACT EXTRACTION W/PHACO  04/17/2011   Procedure: CATARACT EXTRACTION PHACO AND INTRAOCULAR LENS PLACEMENT (IOC);  Surgeon: [name], MD;  [facility];  Service: Ophthalmology;  Laterality: Left;  CDE:14.58  . CATARACT EXTRACTION W/PHACO  05/22/2011   Procedure: CATARACT EXTRACTION PHACO AND INTRAOCULAR LENS PLACEMENT (IOC);  Surgeon: [name], MD;  Location: [facility];  Service: Ophthalmology;  Laterality: Right;  CDE 13.02  . COLONOSCOPY  01/17/2011   Procedure: COLONOSCOPY;  Surgeon: Jamesetta So;  [facility];  Service: Gastroenterology;  Laterality: N/A;  . COLONOSCOPY N/A 02/24/2016   Procedure: COLONOSCOPY;  Surgeon: Rogene Houston, MD;  [facility];  Service: Endoscopy;  Laterality: N/A;  12:50  . INGUINAL HERNIA REPAIR Right 09/27/2015   Procedure: RIGHT INGUINAL HERNIORRHAPY WITH MESH SMALL BOWEL RESECTION;  Surgeon: Aviva Signs, MD;  Location: [facility];  Service: General;  Laterality: Right;  . NM [place]  02/2009   dipyridamole; mild perfusion defect due to infarct/scar with mild perinfarct ischemia is apical septal, apical basal inferoseptal, basal inferior, mid  inferoseptal mid inferior & apical inferior; EKG negative for ischemia; low risk scan  . PROSTATE SURGERY     7 years ago [facility] Dr Javaid-prostatectomy  . PROSTATECTOMY    . Sleep Study  05/2011   increased upper airway resistance syndrome with mild sleep apnea during REM  . TOTAL HIP ARTHROPLASTY Left 05/02/2016   Procedure: LEFT TOTAL HIP ARTHROPLASTY;  Surgeon: Marchia Bond, MD;  Location: [place];  Service: Orthopedics;   Laterality: Left;  . TRANSTHORACIC ECHOCARDIOGRAM  04/2011   EF=50-55%; RV mildly dilated; LA mild-mod dilated; mild mitral valve annular calcif, mild MR; mod TR, RVSP elevated 30-9mmHg, mild pulm HTN; AV mildly sclerotic, mild calcif of AV leaflets; trace pulm valve regurg     Current Medications: Outpatient Medications Prior to Visit  Medication Sig Dispense Refill  . Calcium Carb-Cholecalciferol (CALCIUM 600+D3) 600-800 MG-UNIT TABS Take 1 tablet by mouth 2 (two) times daily.    . ferrous sulfate (QC FERROUS SULFATE) 325 (65 FE) MG tablet Take 325 mg by mouth daily with breakfast.    . levothyroxine (SYNTHROID, LEVOTHROID) 88 MCG tablet Take 1 tablet (88 mcg total) by mouth daily before breakfast. 30 tablet 6  . metoprolol tartrate (LOPRESSOR) 50 MG tablet TAKE 1 TABLET BY MOUTH TWICE DAILY 60 tablet 11  . sodium bicarbonate 650 MG tablet Take 650 mg by mouth 2 (two) times daily.    . Vitamin D, Ergocalciferol, (DRISDOL) 1.25 MG (50000 UT) CAPS capsule Take 50,000 Units by mouth daily.    Alveda Reasons 15 MG TABS tablet TAKE 1 TABLET BY MOUTH ONCE DAILY WITH SUPPER 90 tablet 1   Facility-Administered Medications Prior to Visit  Medication Dose Route Frequency Provider Last Rate Last Dose  . neomycin-polymyxin-dexameth (MAXITROL) 0.1 % ophth ointment    PRN [name], MD   1 application at 50/93/26 7124     Allergies:   No known allergies   Social History   Socioeconomic History  . Marital status: Single    Spouse name: Not on file  . Number of children: 2  . Years of education: Not on file  . Highest education level: Not on file  Occupational History  . Not on file  Social Needs  . Financial resource strain: Not on file  . Food insecurity:    Worry: Not on file    Inability: Not on file  . Transportation needs:    Medical: Not on file    Non-medical: Not on file  Tobacco Use  . Smoking status: Never Smoker  . Smokeless tobacco: Never Used  Substance and Sexual Activity    . Alcohol use: No  . Drug use: No  . Sexual activity: Yes    Birth control/protection: None  Lifestyle  . Physical activity:    Days per week: Not on file    Minutes per session: Not on file  . Stress: Not on file  Relationships  . Social connections:    Talks on phone: Not on file    Gets together: Not on file    Attends religious service: Not on file    Active member of club or organization: Not on file    Attends meetings of clubs or organizations: Not on file    Relationship status: Not on file  Other Topics Concern  . Not on file  Social History Narrative  . Not on file     Family History:  The patient's family history includes Diabetes in his mother; Prostate cancer in his  father.   ROS:   Please see the history of present illness.    ROS All other systems reviewed and are negative.   PHYSICAL EXAM:   VS:  BP 106/78   Pulse 93   Ht 6' 2.5" (1.892 m)   Wt 272 lb 6.4 oz (123.6 kg)   SpO2 93%   BMI 34.51 kg/m    GEN: Well nourished, well developed, in no acute distress  HEENT: normal  Neck: no JVD, carotid bruits, or masses Cardiac: Irregularly irregular; no murmurs, rubs, or gallops,no edema  Respiratory:  clear to auscultation bilaterally, normal work of breathing GI: soft, nontender, nondistended, + BS MS: no deformity or atrophy  Skin: warm and dry, no rash Neuro:  Alert and Oriented x 3, Strength and sensation are intact Psych: euthymic mood, full affect  Wt Readings from Last 3 Encounters:  11/26/17 272 lb 6.4 oz (123.6 kg)  06/25/17 273 lb (123.8 kg)  12/25/16 267 lb (121.1 kg)      Studies/Labs Reviewed:   EKG:  EKG is ordered today.  The ekg ordered today demonstrates atrial fibrillation, left anterior fascicular block, heart rate 93  Recent Labs: 12/15/2016: BUN 26; Potassium 4.5; Sodium 137 03/12/2017: Creatinine, Ser 2.20 06/18/2017: TSH 3.570   Lipid Panel    Component Value Date/Time   CHOL  02/04/2009 0538    142        ATP III  CLASSIFICATION:  <200     mg/dL   Desirable  200-239  mg/dL   Borderline High  >=240    mg/dL   High          TRIG 122 02/04/2009 0538   HDL 33 (L) 02/04/2009 0538   CHOLHDL 4.3 02/04/2009 0538   VLDL 24 02/04/2009 0538   LDLCALC  02/04/2009 0538    85        Total Cholesterol/HDL:CHD Risk Coronary Heart Disease Risk Table                     Men   Women  1/2 Average Risk   3.4   3.3  Average Risk       5.0   4.4  2 X Average Risk   9.6   7.1  3 X Average Risk  23.4   11.0        Use the calculated Patient Ratio above and the CHD Risk Table to determine the patient's CHD Risk.        ATP III CLASSIFICATION (LDL):  <100     mg/dL   Optimal  100-129  mg/dL   Near or Above                    Optimal  130-159  mg/dL   Borderline  160-189  mg/dL   High  >190     mg/dL   Very High    Additional studies/ records that were reviewed today include:   Echo 08/30/2015 LV EF: 50% -   55% Study Conclusions  - Left ventricle: The cavity size was normal. Wall thickness was   increased in a pattern of mild LVH. There was mild focal basal   hypertrophy of the septum. Systolic function was normal. The   estimated ejection fraction was in the range of 50% to 55%. Wall   motion was normal; there were no regional wall motion   abnormalities. Left ventricular diastolic function parameters   were normal. - Mitral valve: There was  mild regurgitation. - Left atrium: The atrium was mildly dilated. - Right atrium: The atrium was mildly dilated. - Atrial septum: No defect or patent foramen ovale was identified. - Tricuspid valve: There was mild-moderate regurgitation. - Pulmonary arteries: PA peak pressure: 41 mm Hg (S).    ASSESSMENT:    1. Chronic a-fib   2. CKD (chronic kidney disease), stage IV ([place])   3. Hypothyroidism, unspecified type   4. Prediabetes   5. Essential hypertension      PLAN:  In order of problems listed above:  1. Chronic atrial fibrillation: Heart rate  controlled on metoprolol, unable to uptitrate metoprolol dosage with current blood pressure.  Continue on Xarelto.  2. Hypertension: Blood pressure stable on current therapy  3. Hypothyroidism: On Synthroid, managed by primary care provider  4. Prediabetes: Monitored by PCP  5. CKD stage IV: followed by Dr. Lowanda Foster    Medication Adjustments/Labs and Tests Ordered: Current medicines are reviewed at length with the patient today.  Concerns regarding medicines are outlined above.  Medication changes, Labs and Tests ordered today are listed in the Patient Instructions below. Patient Instructions  Medication Instructions:  Your physician recommends that you continue on your current medications as directed. Please refer to the Current Medication list given to you today.  If you need a refill on your cardiac medications before your next appointment, please call your pharmacy.   Lab work: None  If you have labs (blood work) drawn today and your tests are completely normal, you will receive your results only by: Marland Kitchen MyChart Message (if you have MyChart) OR . A paper copy in the mail If you have any lab test that is abnormal or we need to change your treatment, we will call you to review the results.  Testing/Procedures: None   Follow-Up: At [facility], you and your health needs are our priority.  As part of our continuing mission to provide you with exceptional heart care, we have created designated Provider Care Teams.  These Care Teams include your primary Cardiologist (physician) and Advanced Practice Providers (APPs -  Physician Assistants and Nurse Practitioners) who all work together to provide you with the care you need, when you need it. You will need a follow up appointment in 12 months.  Please call our office 2 months in advance to schedule this appointment.  You may see Dr [name] or one of the following Advanced Practice Providers on your designated Care Team: [place],  [place] . Fabian Sharp, PA-C  Any Other Special Instructions Will Be Listed Below (If Applicable).      Hilbert Corrigan, [place]  11/26/2017 1:17 PM    [facility] [place], [place], [place]  [address] Phone: [phone]; Fax: [phone]

## 2017-11-28 ENCOUNTER — Encounter: Payer: Self-pay | Admitting: Physician Assistant

## 2017-12-03 NOTE — Progress Notes (Signed)
Thanks, again [employer]

## 2017-12-10 ENCOUNTER — Telehealth: Payer: Self-pay | Admitting: Cardiovascular Disease

## 2017-12-10 DIAGNOSIS — E785 Hyperlipidemia, unspecified: Secondary | ICD-10-CM

## 2017-12-10 DIAGNOSIS — R0789 Other chest pain: Secondary | ICD-10-CM

## 2017-12-10 NOTE — Telephone Encounter (Signed)
Please advise if you have seen any paperwork for this patient. Thank you!

## 2017-12-10 NOTE — Telephone Encounter (Signed)
New message   Patient's son wants to know when the entresto paperwork will be ready to pickup. He states that he left the paperwork on 12/26/2017. Please advise.

## 2017-12-17 ENCOUNTER — Other Ambulatory Visit: Payer: Self-pay | Admitting: "Endocrinology

## 2017-12-17 DIAGNOSIS — E559 Vitamin D deficiency, unspecified: Secondary | ICD-10-CM | POA: Diagnosis not present

## 2017-12-17 DIAGNOSIS — E039 Hypothyroidism, unspecified: Secondary | ICD-10-CM | POA: Diagnosis not present

## 2017-12-17 DIAGNOSIS — E212 Other hyperparathyroidism: Secondary | ICD-10-CM | POA: Diagnosis not present

## 2017-12-18 LAB — TSH: TSH: 1.41 u[IU]/mL (ref 0.450–4.500)

## 2017-12-18 LAB — PARATHYROID HORMONE, INTACT (NO CA): PTH: 26 pg/mL (ref 15–65)

## 2017-12-18 LAB — HGB A1C W/O EAG: HEMOGLOBIN A1C: 5.8 % — AB (ref 4.8–5.6)

## 2017-12-18 LAB — T4, FREE: FREE T4: 0.98 ng/dL (ref 0.82–1.77)

## 2017-12-18 LAB — VITAMIN D 25 HYDROXY (VIT D DEFICIENCY, FRACTURES): Vit D, 25-Hydroxy: 47.8 ng/mL (ref 30.0–100.0)

## 2017-12-25 ENCOUNTER — Encounter: Payer: Self-pay | Admitting: "Endocrinology

## 2017-12-25 ENCOUNTER — Ambulatory Visit (INDEPENDENT_AMBULATORY_CARE_PROVIDER_SITE_OTHER): Payer: PPO | Admitting: "Endocrinology

## 2017-12-25 VITALS — BP 101/71 | HR 77 | Ht 74.5 in | Wt 268.0 lb

## 2017-12-25 DIAGNOSIS — E212 Other hyperparathyroidism: Secondary | ICD-10-CM

## 2017-12-25 DIAGNOSIS — J069 Acute upper respiratory infection, unspecified: Secondary | ICD-10-CM | POA: Diagnosis not present

## 2017-12-25 DIAGNOSIS — E559 Vitamin D deficiency, unspecified: Secondary | ICD-10-CM

## 2017-12-25 DIAGNOSIS — R7303 Prediabetes: Secondary | ICD-10-CM | POA: Diagnosis not present

## 2017-12-25 DIAGNOSIS — Z6837 Body mass index (BMI) 37.0-37.9, adult: Secondary | ICD-10-CM | POA: Diagnosis not present

## 2017-12-25 DIAGNOSIS — E039 Hypothyroidism, unspecified: Secondary | ICD-10-CM

## 2017-12-25 DIAGNOSIS — Z1389 Encounter for screening for other disorder: Secondary | ICD-10-CM | POA: Diagnosis not present

## 2017-12-25 MED ORDER — LEVOTHYROXINE SODIUM 88 MCG PO TABS: 88.0000 ug | ORAL_TABLET | Freq: Every day | ORAL | 12 refills | Status: DC

## 2017-12-25 NOTE — Telephone Encounter (Signed)
Contacted Wynetta Emery and [employer] patient assistance foundation told them I faxed over the patient assistance form last month on 11/26/2017; they stated they just got the form yesterday and it will take 5-7 business days to process. I told the representative that this request is Urgent because it was faxed over last month. She stated she would mark the request urgent and in 24-48 hours the office and the patient will receive a decision in the mail.   Contacted son Om Lizotte, no answer so I left him a message.   Left detailed message on patients cell phone informing him with an update of his medication.

## 2017-12-25 NOTE — Progress Notes (Signed)
Endocrinology follow-up note   Subjective:    Patient ID: [name] [name], male    DOB: 1940/06/17, PCP [name], MD   Past Medical History:  Diagnosis Date  . Cardiomyopathy [facility])    h/o felt 2nd atrial fib, EF normal by echo 2013  . CHF (congestive heart failure) ([place])   . Chronic anticoagulation   . Chronic atrial fibrillation   . Chronic kidney disease (CKD), stage IV (severe) ([place])   . Dysrhythmia   . Hypothyroidism   . Pneumonia   . Pre-diabetes   . Primary localized osteoarthritis of left hip 05/02/2016  . Prostate cancer ([place])   . Shortness of breath   . Systemic hypertension    Past Surgical History:  Procedure Laterality Date  . CARDIOVERSION    . CATARACT EXTRACTION W/PHACO  04/17/2011   Procedure: CATARACT EXTRACTION PHACO AND INTRAOCULAR LENS PLACEMENT (IOC);  Surgeon: [name], MD;  [facility];  Service: Ophthalmology;  Laterality: Left;  CDE:14.58  . CATARACT EXTRACTION W/PHACO  05/22/2011   Procedure: CATARACT EXTRACTION PHACO AND INTRAOCULAR LENS PLACEMENT (IOC);  Surgeon: [name], MD;  Location: [facility];  Service: Ophthalmology;  Laterality: Right;  CDE 13.02  . COLONOSCOPY  01/17/2011   Procedure: COLONOSCOPY;  Surgeon: Jamesetta So;  [facility];  Service: Gastroenterology;  Laterality: N/A;  . COLONOSCOPY N/A 02/24/2016   Procedure: COLONOSCOPY;  Surgeon: Rogene Houston, MD;  [facility];  Service: Endoscopy;  Laterality: N/A;  12:50  . INGUINAL HERNIA REPAIR Right 09/27/2015   Procedure: RIGHT INGUINAL HERNIORRHAPY WITH MESH SMALL BOWEL RESECTION;  Surgeon: Aviva Signs, MD;  Location: [facility];  Service: General;  Laterality: Right;  . NM MYOCAR [place]  02/2009   dipyridamole; mild perfusion defect due to infarct/scar with mild perinfarct ischemia is apical septal, apical basal inferoseptal, basal inferior, mid inferoseptal mid inferior & apical inferior; EKG negative for ischemia; low risk scan  . PROSTATE  SURGERY     7 years ago [facility] Dr Javaid-prostatectomy  . PROSTATECTOMY    . Sleep Study  05/2011   increased upper airway resistance syndrome with mild sleep apnea during REM  . TOTAL HIP ARTHROPLASTY Left 05/02/2016   Procedure: LEFT TOTAL HIP ARTHROPLASTY;  Surgeon: Marchia Bond, MD;  Location: [place];  Service: Orthopedics;  Laterality: Left;  . TRANSTHORACIC ECHOCARDIOGRAM  04/2011   EF=50-55%; RV mildly dilated; LA mild-mod dilated; mild mitral valve annular calcif, mild MR; mod TR, RVSP elevated 30-90mmHg, mild pulm HTN; AV mildly sclerotic, mild calcif of AV leaflets; trace pulm valve regurg    Social History   Socioeconomic History  . Marital status: Single    Spouse name: Not on file  . Number of children: 2  . Years of education: Not on file  . Highest education level: Not on file  Occupational History  . Not on file  Social Needs  . Financial resource strain: Not on file  . Food insecurity:    Worry: Not on file    Inability: Not on file  . Transportation needs:    Medical: Not on file    Non-medical: Not on file  Tobacco Use  . Smoking status: Never Smoker  . Smokeless tobacco: Never Used  Substance and Sexual Activity  . Alcohol use: No  . Drug use: No  . Sexual activity: Yes    Birth control/protection: None  Lifestyle  . Physical activity:    Days per week: Not  on file    Minutes per session: Not on file  . Stress: Not on file  Relationships  . Social connections:    Talks on phone: Not on file    Gets together: Not on file    Attends religious service: Not on file    Active member of club or organization: Not on file    Attends meetings of clubs or organizations: Not on file    Relationship status: Not on file  Other Topics Concern  . Not on file  Social History Narrative  . Not on file   Outpatient Encounter Medications as of 12/25/2017  Medication Sig  . Calcium Carb-Cholecalciferol (CALCIUM 600+D3) 600-800 MG-UNIT TABS Take 1 tablet by mouth 2  (two) times daily.  . ferrous sulfate (QC FERROUS SULFATE) 325 (65 FE) MG tablet Take 325 mg by mouth daily with breakfast.  . levothyroxine (SYNTHROID, LEVOTHROID) 88 MCG tablet Take 1 tablet (88 mcg total) by mouth daily before breakfast.  . metoprolol tartrate (LOPRESSOR) 50 MG tablet TAKE 1 TABLET BY MOUTH TWICE DAILY  . sodium bicarbonate 650 MG tablet Take 650 mg by mouth 2 (two) times daily.  Alveda Reasons 15 MG TABS tablet TAKE 1 TABLET BY MOUTH ONCE DAILY WITH SUPPER  . [DISCONTINUED] levothyroxine (SYNTHROID, LEVOTHROID) 88 MCG tablet Take 1 tablet (88 mcg total) by mouth daily before breakfast.  . [DISCONTINUED] Vitamin D, Ergocalciferol, (DRISDOL) 1.25 MG (50000 UT) CAPS capsule Take 50,000 Units by mouth daily.   Facility-Administered Encounter Medications as of 12/25/2017  Medication  . neomycin-polymyxin-dexameth (MAXITROL) 0.1 % ophth ointment   ALLERGIES: Allergies  Allergen Reactions  . No Known Allergies    VACCINATION STATUS:  There is no immunization history on file for this patient.  HPI  77 yr old male with above medical history. He is here to follow-up for hypothyroidism, hypocalcemia, vitamin D deficiency with history of tertiary hyperparathyroidism.   He was found to have primary hypothyroidism  approximate at age of 77 years and was initiated on levothyroxine currently at 88 mcg p.o. every morning.   -He is compliant, continues to feel better. He has no new complaints.  he denies cold/heat intolerance. - He is progressively gaining weight.  -He denies history of goiter, nor family history of thyroid cancer.  -  he survives prostate cancer. -he has history of tertiary hyperparathyroidism with PTH as high as 99 which is currently improving to 26 with supplement of calcium and vitamin D. His most recent calcium is corrected at 10.2.  his prior 24 hour urine calcium has been  low at 57.   Review of Systems  Constitutional: +  Steady Weight, no fatigue, no  subjective hyperthermia/hypothermia Eyes: no blurry vision, no xerophthalmia ENT: no sore throat, no nodules palpated in throat, no dysphagia, no odynophagia.   Cardiovascular: no chest pain, no palpitations.   Musculoskeletal: no muscle/joint aches Skin: no rashes Neurological: no tremors, no tingling.   Psychiatric: no depression/anxiety  Objective:    There were no vitals taken for this visit.  Wt Readings from Last 3 Encounters:  11/26/17 272 lb 6.4 oz (123.6 kg)  06/25/17 273 lb (123.8 kg)  12/25/16 267 lb (121.1 kg)    Physical Exam   Constitutional: + Obese, not in acute distress.  Eyes: PERRLA, EOMI, no exophthalmos ENT: moist mucous membranes, poor dental condition, no thyromegaly, no cervical lymphadenopathy, poor dental condition. Musculoskeletal: no deformities, strength intact in all 4 Skin: moist, warm, no rashes Neurological: no tremor with  outstretched hand  Results for orders placed or performed in visit on 12/17/17  Hgb A1c w/o eAG  Result Value Ref Range   Hgb A1c MFr Bld 5.8 (H) 4.8 - 5.6 %  T4, free  Result Value Ref Range   Free T4 0.98 0.82 - 1.77 ng/dL  TSH  Result Value Ref Range   TSH 1.410 0.450 - 4.500 uIU/mL  VITAMIN D 25 Hydroxy (Vit-D Deficiency, Fractures)  Result Value Ref Range   Vit D, 25-Hydroxy 47.8 30.0 - 100.0 ng/mL  Parathyroid hormone, intact (no Ca)  Result Value Ref Range   PTH 26 15 - 65 pg/mL    Lipid Panel     Component Value Date/Time   CHOL  02/04/2009 0538    142        ATP III CLASSIFICATION:  <200     mg/dL   Desirable  200-239  mg/dL   Borderline High  >=240    mg/dL   High          TRIG 122 02/04/2009 0538   HDL 33 (L) 02/04/2009 0538   CHOLHDL 4.3 02/04/2009 0538   VLDL 24 02/04/2009 0538   LDLCALC  02/04/2009 0538    85        Total Cholesterol/HDL:CHD Risk Coronary Heart Disease Risk Table                     Men   Women  1/2 Average Risk   3.4   3.3  Average Risk       5.0   4.4  2 X Average Risk    9.6   7.1  3 X Average Risk  23.4   11.0        Use the calculated Patient Ratio above and the CHD Risk Table to determine the patient's CHD Risk.        ATP III CLASSIFICATION (LDL):  <100     mg/dL   Optimal  100-129  mg/dL   Near or Above                    Optimal  130-159  mg/dL   Borderline  160-189  mg/dL   High  >190     mg/dL   Very High      Assessment & Plan:   1.  Hypothyroidism -His previsit thyroid function tests are consistent with appropriate replacement.  He is advised to continue levothyroxine to 88 mcg p.o. every morning.     - We discussed about correct intake of levothyroxine, at fasting, with water, separated by at least 30 minutes from breakfast, and separated by more than 4 hours from calcium, iron, multivitamins, acid reflux medications (PPIs). -Patient is made aware of the fact that thyroid hormone replacement is needed for life, dose to be adjusted by periodic monitoring of thyroid function tests.   2. Tertiary hyperparathyroidism    His last PTH is stable at 50 with calcium of 10.2.  He is now vitamin D replete at 25.  He is advised to maintain with over-the-counter vitamin D and calcium supplements.  He does not need high-dose prescription strength vitamin D. His prior 24 hour urine calcium has been  low at 57  3. Hypertension: Controlled. I advised him to continue losartan and carvedilol.  5.  Obesity with prediabetes At risk for diabetes.  He has regained his weight that he lost status post surgery for small bowel obstruction.  -His previsit labs show  A1c of 5.8%.  He does not need medication intervention at this time.  -  Suggestion is made for him to avoid simple carbohydrates  from his diet including Cakes, Sweet Desserts / Pastries, Ice Cream, Soda (diet and regular), Sweet Tea, Candies, Chips, Cookies, Store Bought Juices, Alcohol in Excess of  1-2 drinks a day, Artificial Sweeteners, and "Sugar-free" Products. This will help patient to  have stable blood glucose profile and potentially avoid unintended weight gain.  - I advised patient to maintain close follow up with [name], MD for primary care needs. Follow up plan: Return in about 1 year (around 12/26/2018) for Follow up with Pre-visit Labs.  Glade Lloyd, MD Phone: [phone]  Fax: [phone]   -  This note was partially dictated with voice recognition software. Similar sounding words can be transcribed inadequately or may not  be corrected upon review.  12/25/2017, 9:02 AM

## 2017-12-27 NOTE — Telephone Encounter (Signed)
Received letter from patient assistance stating patients request for assistance has been denied.   Left message for patient and son to contact office.  Spoke with Isaac Laud he suggests changing Xarelto to Eliquis 5mg  BID.

## 2017-12-28 NOTE — Telephone Encounter (Signed)
Patient's son calling back

## 2018-01-07 NOTE — Telephone Encounter (Signed)
Left message for [name], patients son, to contact office to discuss medication changes.

## 2018-01-14 DIAGNOSIS — N183 Chronic kidney disease, stage 3 (moderate): Secondary | ICD-10-CM | POA: Diagnosis not present

## 2018-01-14 DIAGNOSIS — Z1159 Encounter for screening for other viral diseases: Secondary | ICD-10-CM | POA: Diagnosis not present

## 2018-01-14 DIAGNOSIS — Z79899 Other long term (current) drug therapy: Secondary | ICD-10-CM | POA: Diagnosis not present

## 2018-01-14 DIAGNOSIS — E559 Vitamin D deficiency, unspecified: Secondary | ICD-10-CM | POA: Diagnosis not present

## 2018-01-14 DIAGNOSIS — R809 Proteinuria, unspecified: Secondary | ICD-10-CM | POA: Diagnosis not present

## 2018-01-14 DIAGNOSIS — I10 Essential (primary) hypertension: Secondary | ICD-10-CM | POA: Diagnosis not present

## 2018-01-18 NOTE — Telephone Encounter (Signed)
Spoke with patients son [name], Per DPR,  and he stated his Dad, [name] [name], picked up an rx for Xarelto last week and the copay was only $50, so for right now they will continue to take the [place]. He states they also sent records to the patient assistance program showing how much the patient has paid and they are hoping they receive coverage this year. I told [name] to contact the office if his dad needs additional refills on Xarelto he voiced understanding.

## 2018-01-22 DIAGNOSIS — R809 Proteinuria, unspecified: Secondary | ICD-10-CM | POA: Diagnosis not present

## 2018-01-22 DIAGNOSIS — N183 Chronic kidney disease, stage 3 (moderate): Secondary | ICD-10-CM | POA: Diagnosis not present

## 2018-01-22 DIAGNOSIS — I509 Heart failure, unspecified: Secondary | ICD-10-CM | POA: Diagnosis not present

## 2018-01-22 DIAGNOSIS — I10 Essential (primary) hypertension: Secondary | ICD-10-CM | POA: Diagnosis not present

## 2018-02-08 IMAGING — CT CT ABD-PELV W/O CM
2 of 4 series · 16 of 46 positions shown, 18 images · non-contrast
Comparison: Plain films 09/20/2015

CLINICAL DATA: Nausea, vomiting beginning 3 days ago.

EXAM:
CT ABDOMEN AND PELVIS WITHOUT CONTRAST
TECHNIQUE: Multidetector CT imaging of the abdomen and pelvis was performed
following the standard protocol without IV contrast.

[Series 2: routine abd pel without · axial · non-contrast · 0.89mm/px · z∈[-459,+26]mm · 13 of 107 slices shown, 15 images]
[im 5/107  soft-tissue]
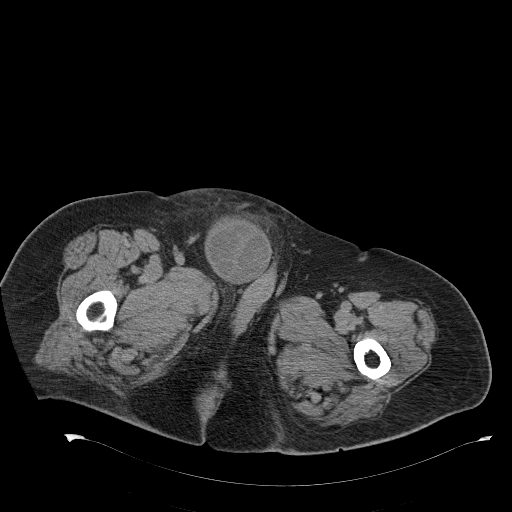
[im 5/107  bone]
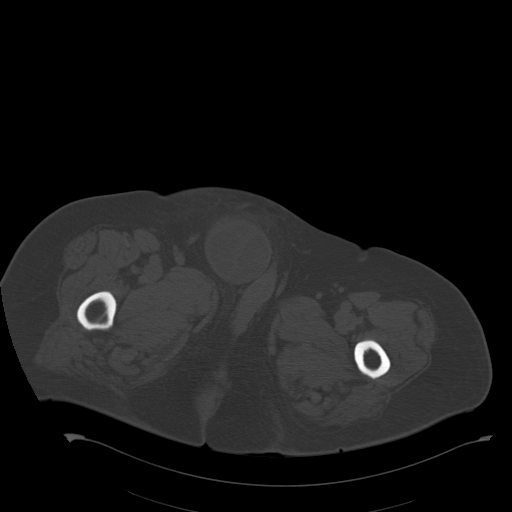
[im 13/107  soft-tissue]
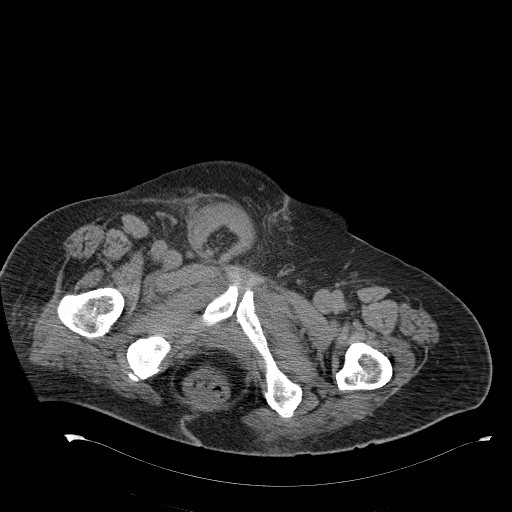
[im 22/107  soft-tissue]
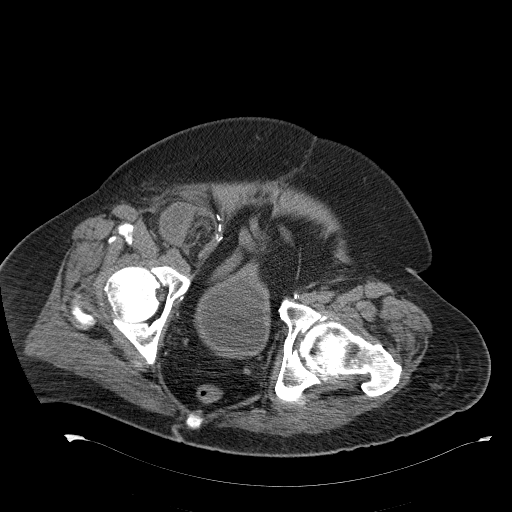
[im 30/107  soft-tissue]
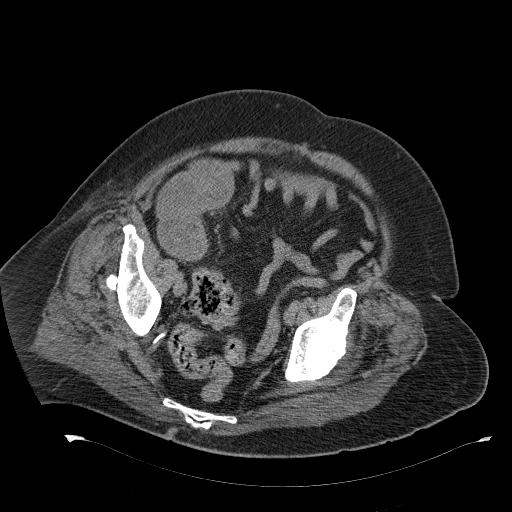
[im 39/107  soft-tissue]
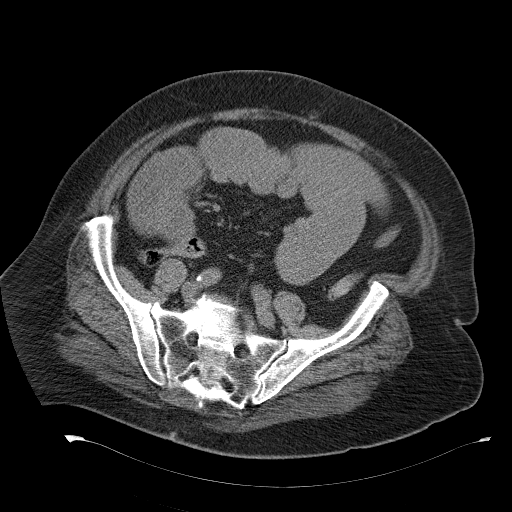
[im 47/107  soft-tissue]
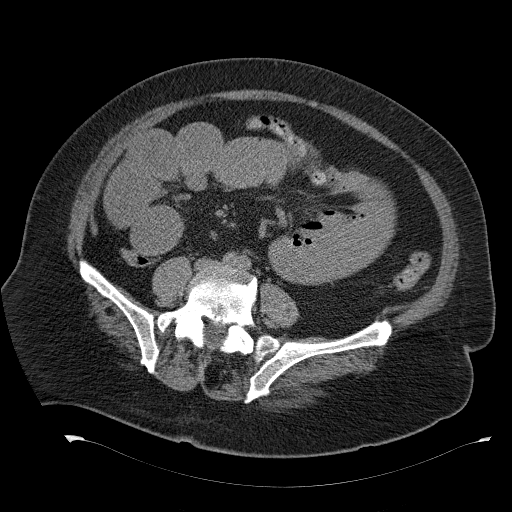
[im 56/107  soft-tissue]
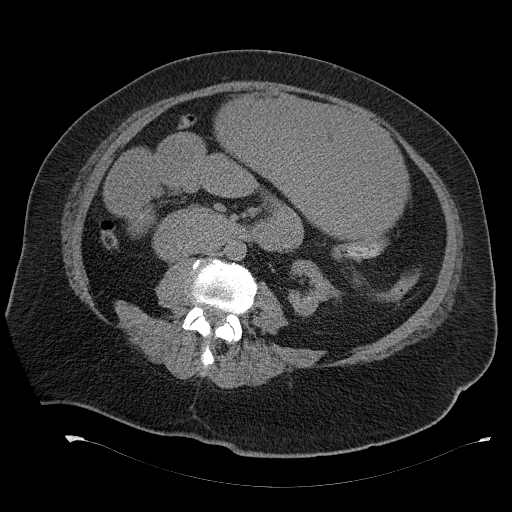
[im 60/107  soft-tissue]
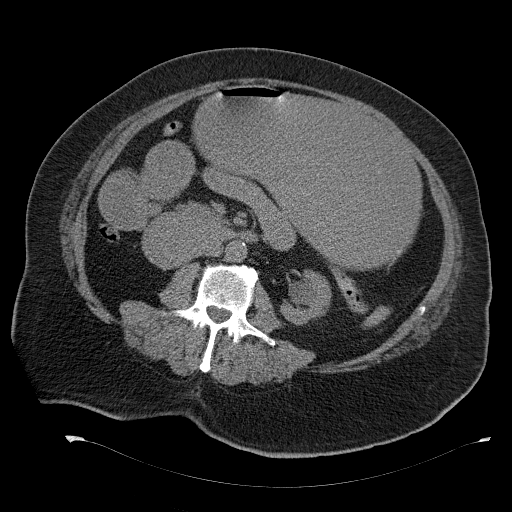
[im 68/107  soft-tissue]
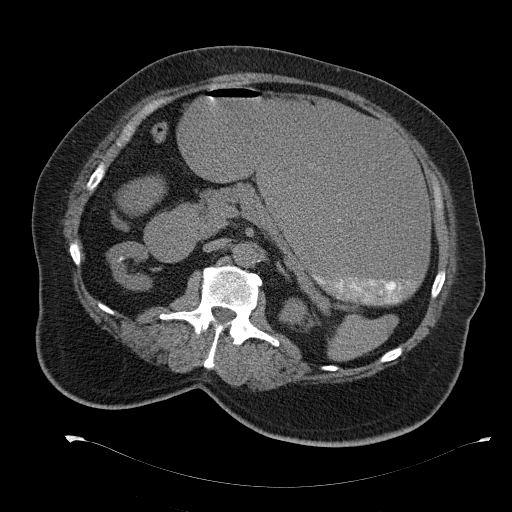
[im 68/107  bone]
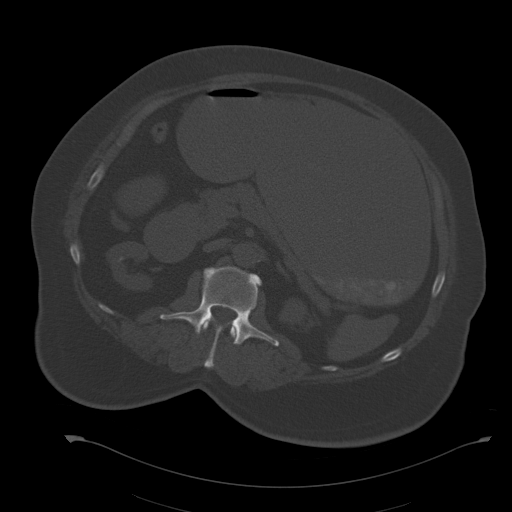
[im 77/107  soft-tissue]
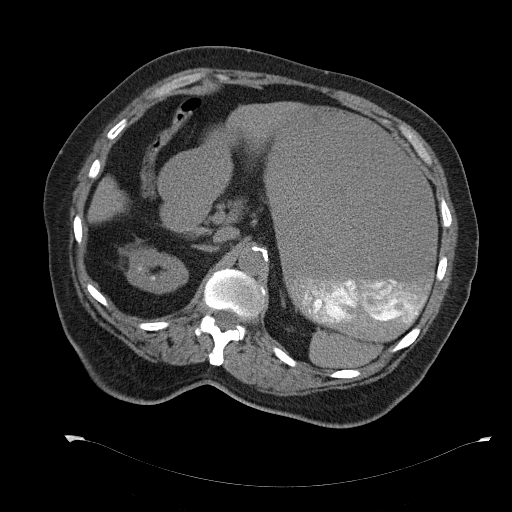
[im 85/107  soft-tissue]
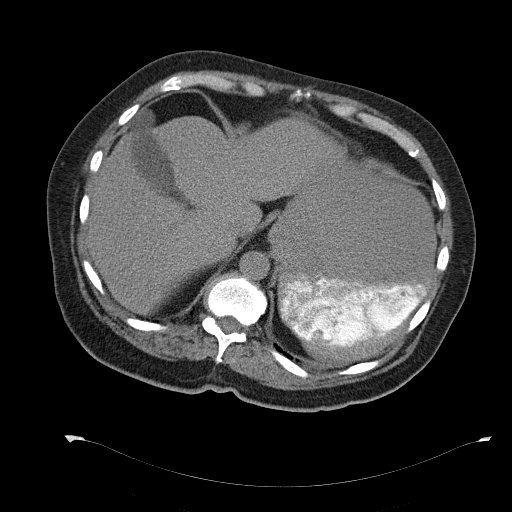
[im 94/107  soft-tissue]
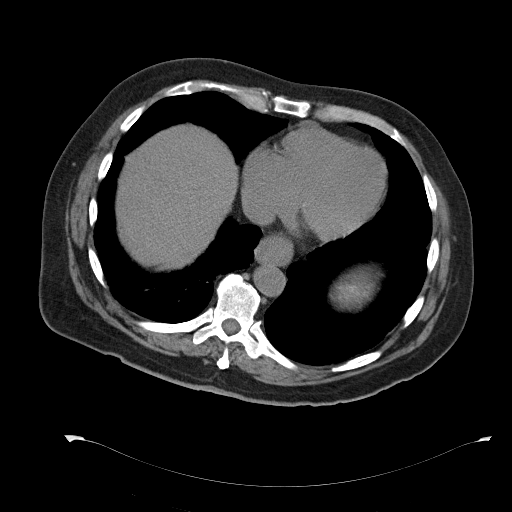
[im 102/107  soft-tissue]
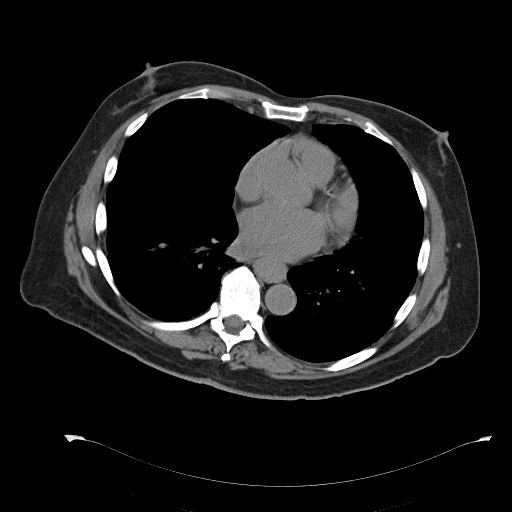

[Series 3: coronal · coronal · 0.88mm/px · 3 of 189 slices shown]
[im 63/189  soft-tissue]
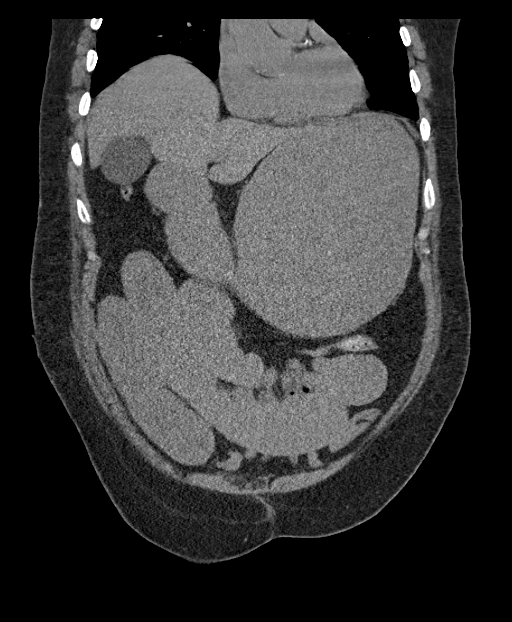
[im 84/189  soft-tissue]
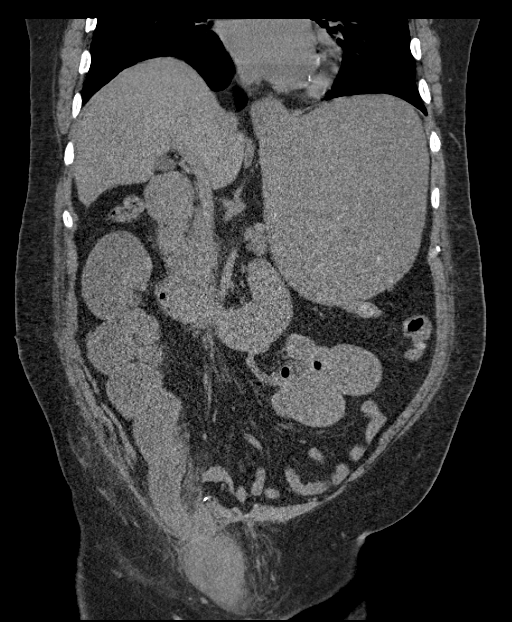
[im 105/189  soft-tissue]
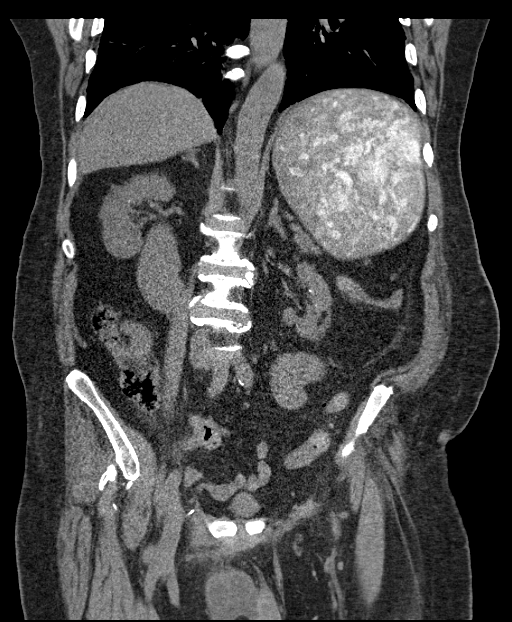

[16 of 46 positions shown; findings below may reference images not displayed]

FINDINGS: Lower chest: Linear atelectasis in the right lung base. Clustered
ground-glass tree-in-bud nodular densities in the lingula and
adjacent left lower lobe, likely mild alveolitis/ small airways
disease. No pleural effusions. Heart is normal size.

Hepatobiliary: No focal hepatic abnormality. Gallbladder
unremarkable.

Pancreas: No focal abnormality or ductal dilatation.

Spleen: No focal abnormality.  Normal size.

Adrenals/Urinary Tract: Kidneys are atrophic with cortical thinning
and scarring. Numerous punctate bilateral nonobstructing renal
stones. No hydronephrosis. Urinary bladder and adrenal glands have
an unremarkable unenhanced appearance.

Stomach/Bowel: Stomach and proximal to mid small bowel loops are
markedly dilated due to incarcerated right inguinal hernia
containing small bowel loops. Distal small bowel loops are
decompressed. Scattered colonic diverticula. No active
diverticulitis. Colon is decompressed.

Vascular/Lymphatic: Scattered aortic and iliac calcifications. No
aneurysm. No adenopathy.

Reproductive: No visible focal abnormality.

Other: No free fluid or free air.

Musculoskeletal: Degenerative changes in the lower lumbar spine.
Sclerotic focus anteriorly within the L4 vertebral body,
nonspecific. This may reflect bone island.
IMPRESSION: High-grade small bowel obstruction due to incarcerated right
inguinal hernia containing small bowel loops.

Clustered ground-glass tree-in-bud nodular densities in the left
lung base, likely small airways disease/alveolitis.

Bilateral nephrolithiasis. Small kidneys with cortical thinning and
scarring. No hydronephrosis.

## 2018-02-08 IMAGING — CR DG ABDOMEN 1V
3 series · 3 of 3 positions shown · non-contrast
Comparison: CT earlier this day.

CLINICAL DATA: NG tube placement.

EXAM:
ABDOMEN - 1 VIEW

[supine ap (1 of 3)]
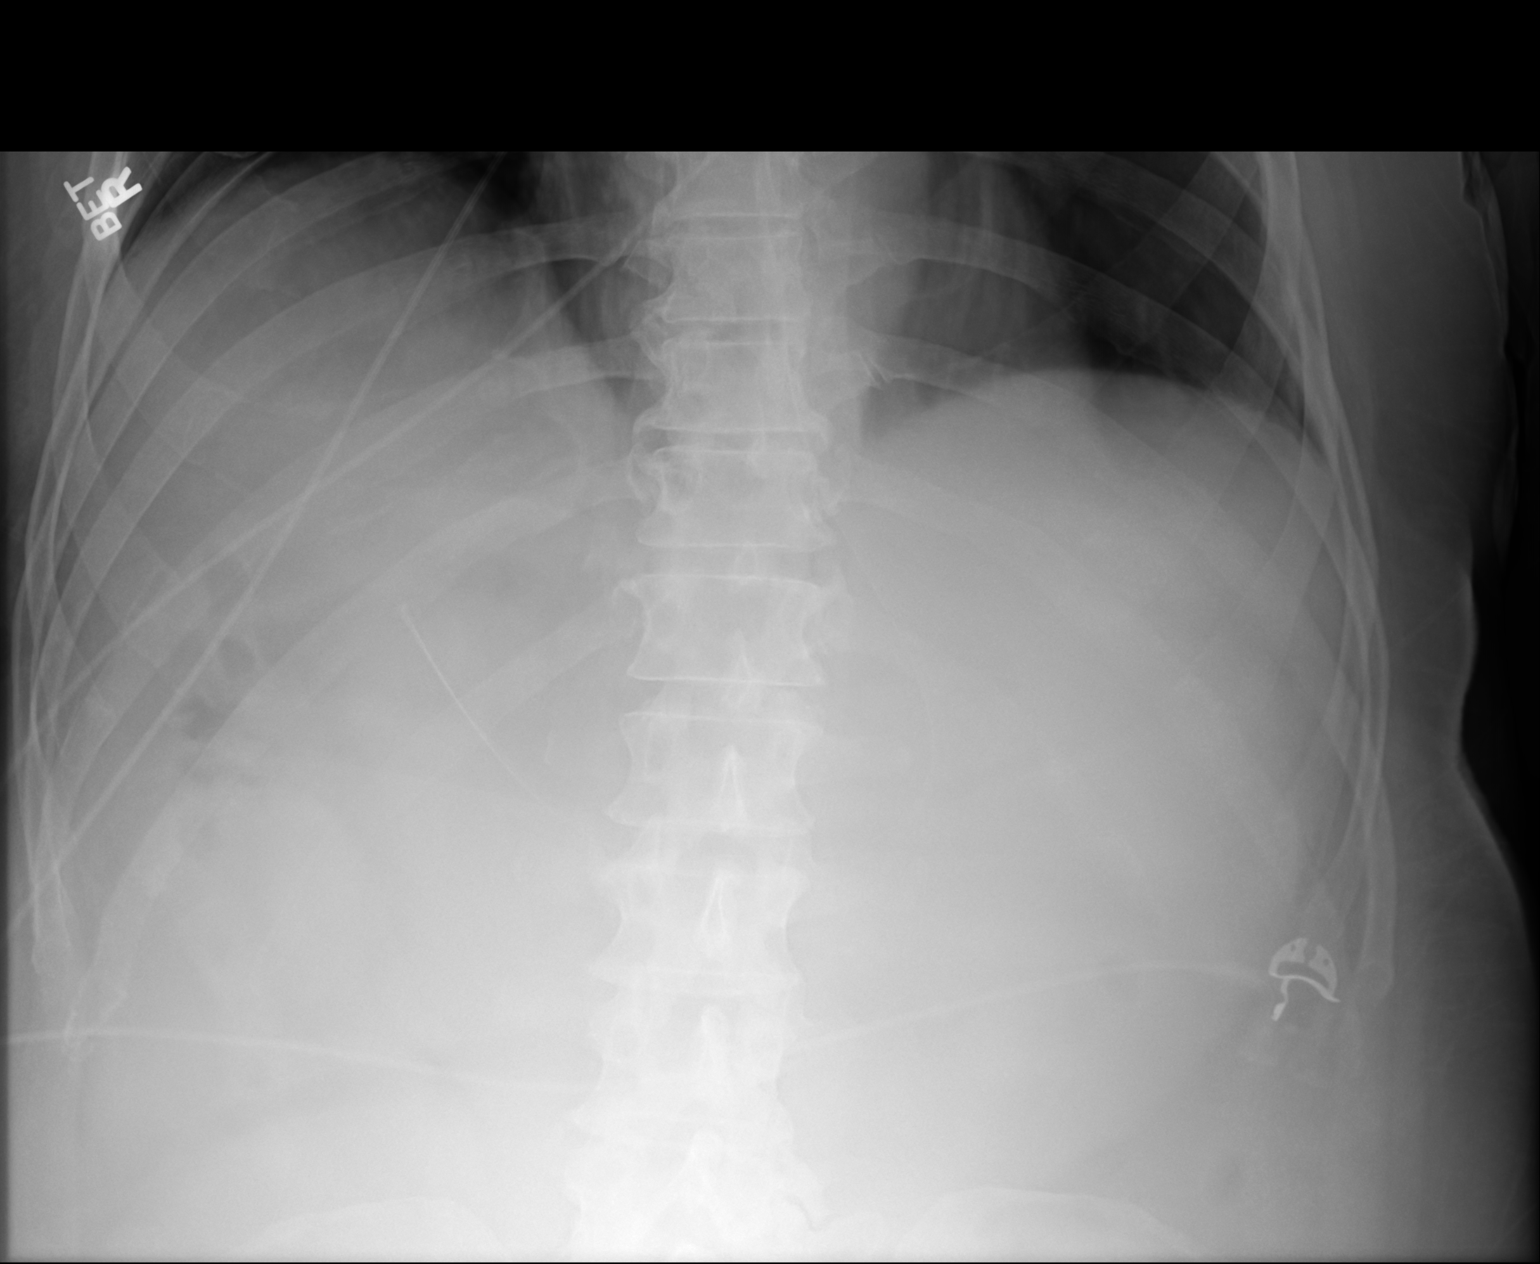

[supine ap (2 of 3)]
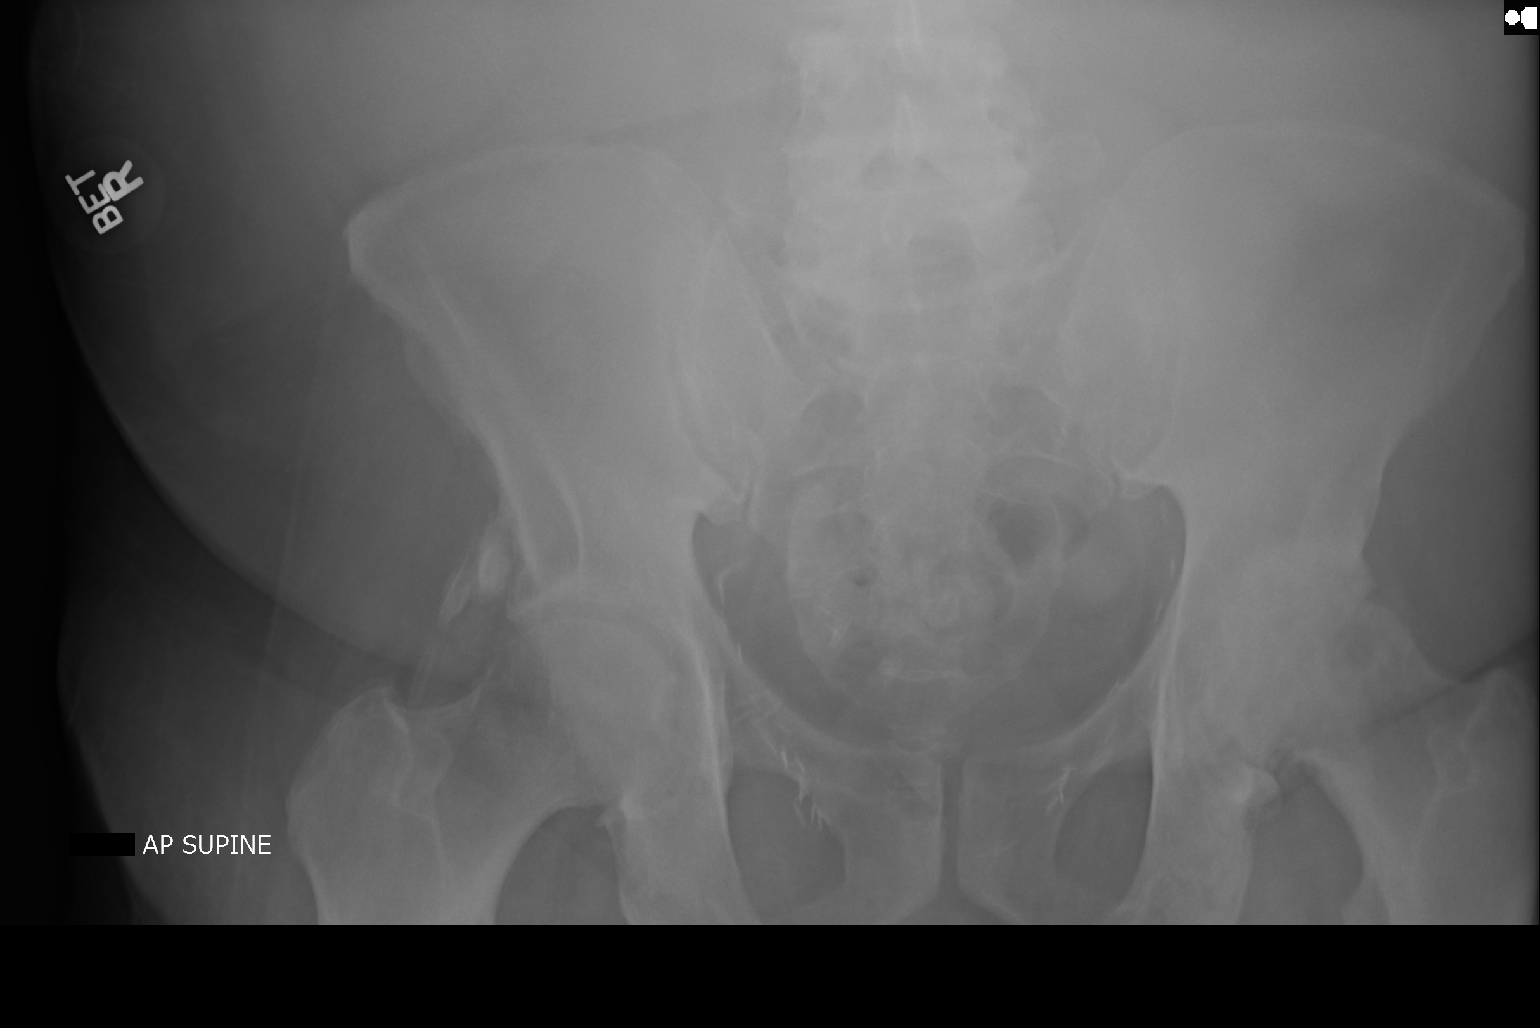

[supine ap (3 of 3)]
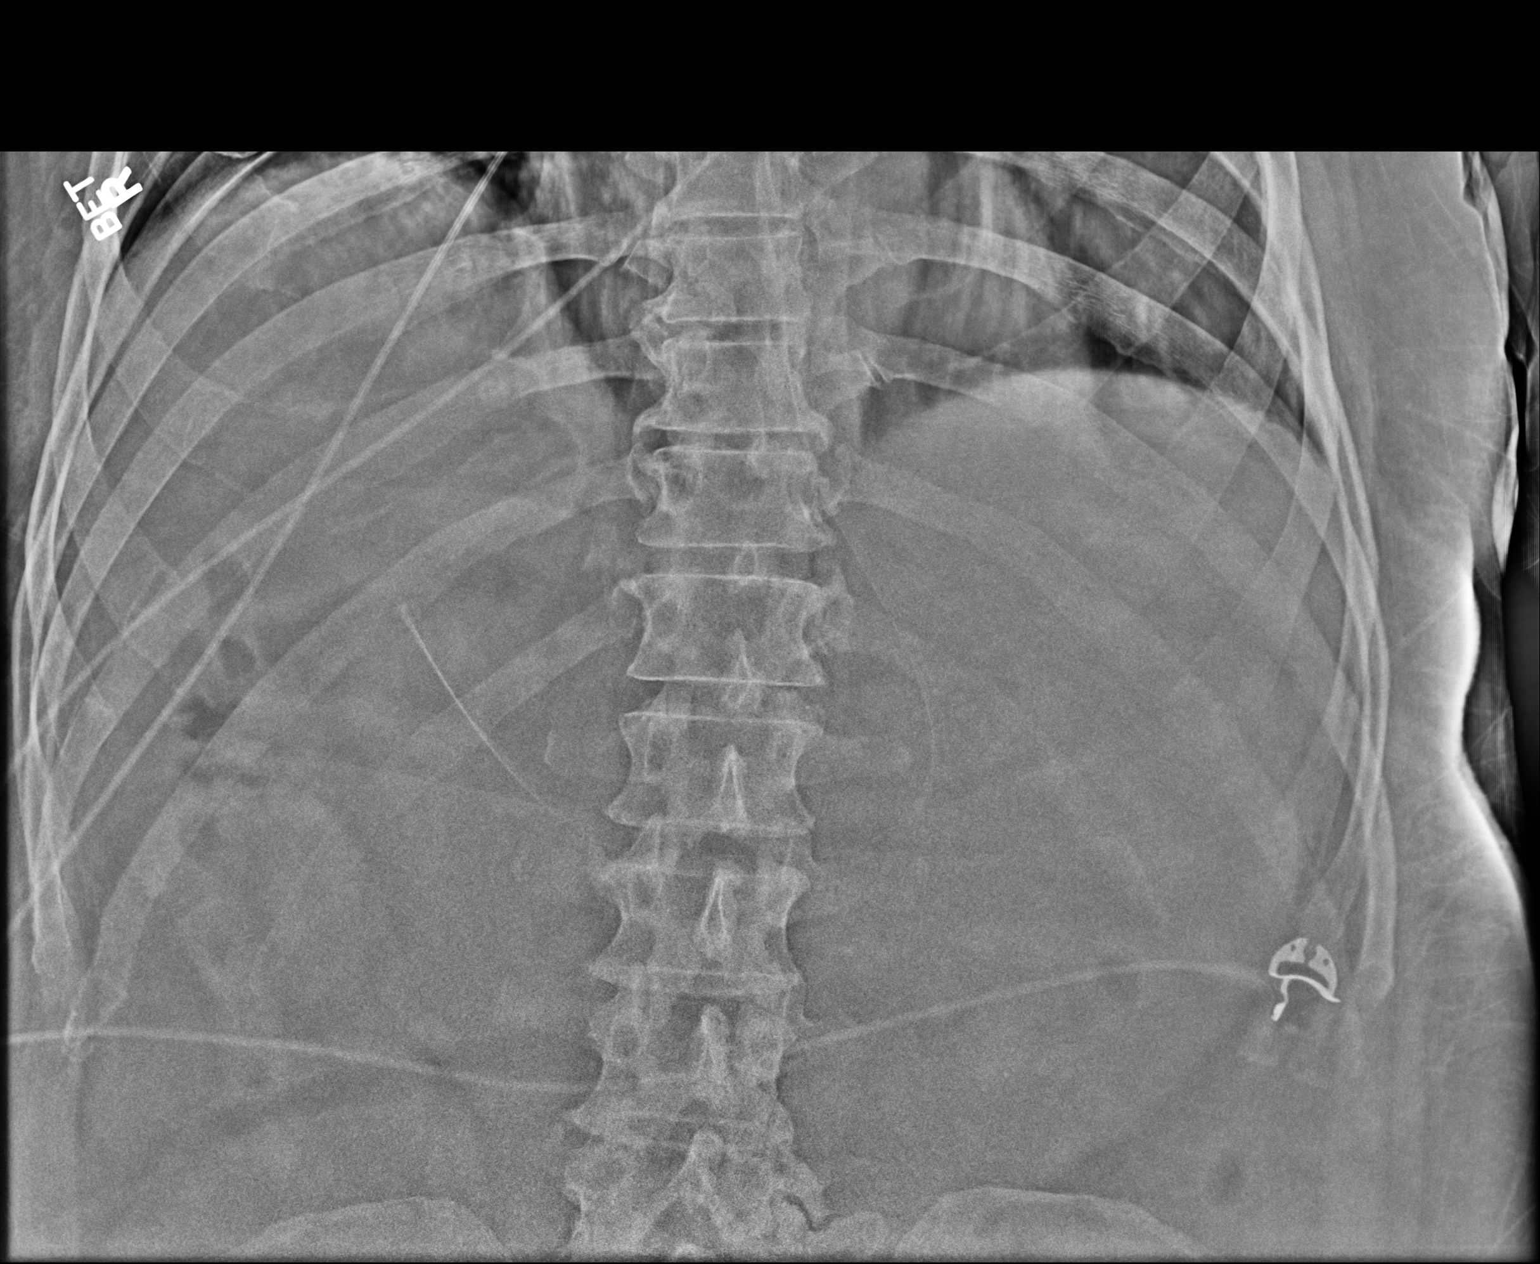

[3 of 3 positions shown; findings below may reference images not displayed]

FINDINGS: The tip and side port of the enteric tube are below the diaphragm in
the stomach. Dilated bowel loops on prior CT are fluid-filled and
not well seen radiographically. There is no evidence of free air.
IMPRESSION: Tip and side port of the enteric tube below the diaphragm in the
stomach.

## 2018-02-10 IMAGING — CR DG CHEST 1V PORT
1 series · 1 of 1 positions shown · non-contrast
Comparison: 09/20/2015 and 02/03/2009 radiographs

CLINICAL DATA: Fever and shortness of breath.

EXAM:
PORTABLE CHEST 1 VIEW

[ap portable]
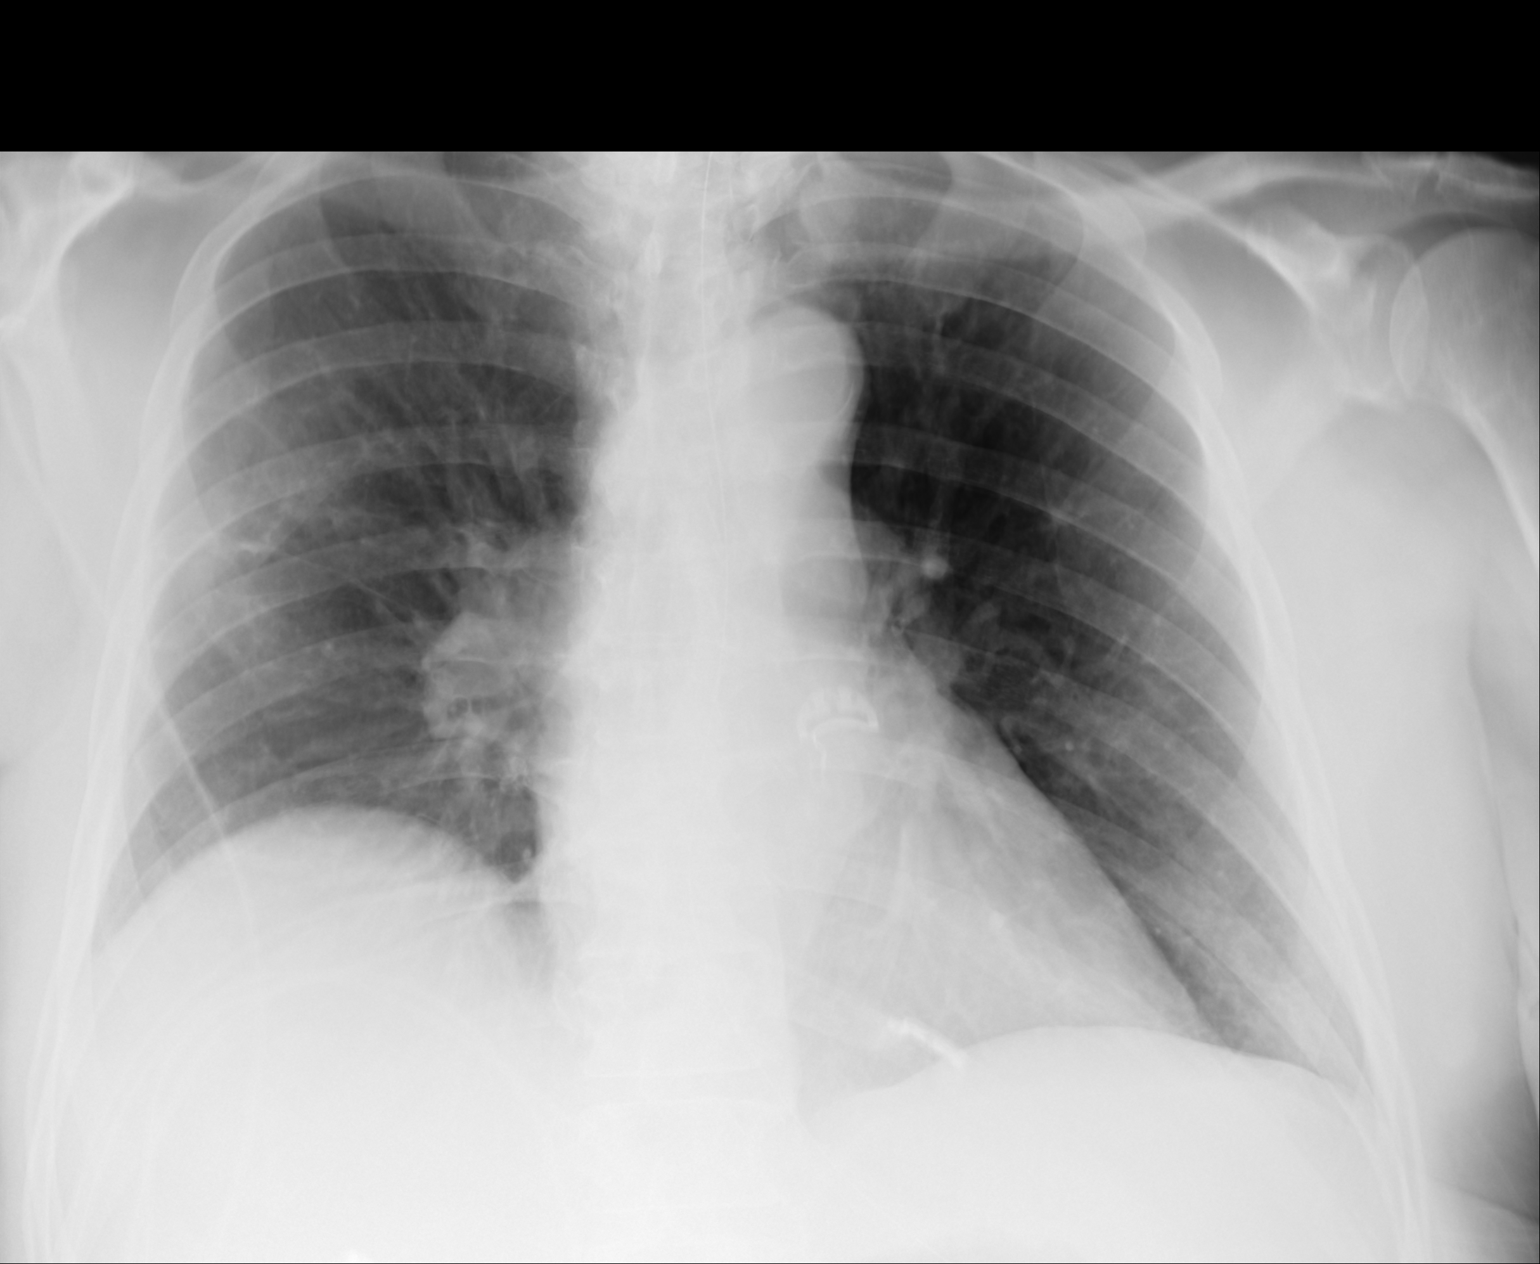

[1 of 1 positions shown; findings below may reference images not displayed]

FINDINGS: The cardiomediastinal silhouette is unchanged.

Elevation of the right hemidiaphragm again noted.

There is no evidence of focal airspace disease, pulmonary edema,
suspicious pulmonary nodule/mass, pleural effusion, or pneumothorax.

No acute bony abnormalities are identified.
IMPRESSION: No evidence of acute cardiopulmonary disease.

## 2018-03-04 DIAGNOSIS — C61 Malignant neoplasm of prostate: Secondary | ICD-10-CM | POA: Diagnosis not present

## 2018-03-12 ENCOUNTER — Ambulatory Visit: Payer: PPO | Admitting: Urology

## 2018-03-12 DIAGNOSIS — Z79899 Other long term (current) drug therapy: Secondary | ICD-10-CM | POA: Diagnosis not present

## 2018-03-12 DIAGNOSIS — C61 Malignant neoplasm of prostate: Secondary | ICD-10-CM

## 2018-03-12 DIAGNOSIS — C7951 Secondary malignant neoplasm of bone: Secondary | ICD-10-CM | POA: Diagnosis not present

## 2018-04-04 ENCOUNTER — Other Ambulatory Visit: Payer: Self-pay | Admitting: Cardiovascular Disease

## 2018-04-18 ENCOUNTER — Other Ambulatory Visit: Payer: Self-pay

## 2018-04-18 MED ORDER — LEVOTHYROXINE SODIUM 88 MCG PO TABS: 88.0000 ug | ORAL_TABLET | Freq: Every day | ORAL | 1 refills | Status: DC

## 2018-05-22 DIAGNOSIS — C44529 Squamous cell carcinoma of skin of other part of trunk: Secondary | ICD-10-CM | POA: Diagnosis not present

## 2018-06-19 DIAGNOSIS — Z681 Body mass index (BMI) 19 or less, adult: Secondary | ICD-10-CM | POA: Diagnosis not present

## 2018-06-19 DIAGNOSIS — Z0001 Encounter for general adult medical examination with abnormal findings: Secondary | ICD-10-CM | POA: Diagnosis not present

## 2018-06-19 DIAGNOSIS — Z1389 Encounter for screening for other disorder: Secondary | ICD-10-CM | POA: Diagnosis not present

## 2018-06-19 DIAGNOSIS — I10 Essential (primary) hypertension: Secondary | ICD-10-CM | POA: Diagnosis not present

## 2018-06-19 DIAGNOSIS — G894 Chronic pain syndrome: Secondary | ICD-10-CM | POA: Diagnosis not present

## 2018-06-24 DIAGNOSIS — C61 Malignant neoplasm of prostate: Secondary | ICD-10-CM | POA: Diagnosis not present

## 2018-07-03 DIAGNOSIS — E785 Hyperlipidemia, unspecified: Secondary | ICD-10-CM | POA: Diagnosis not present

## 2018-07-03 DIAGNOSIS — Z0000 Encounter for general adult medical examination without abnormal findings: Secondary | ICD-10-CM | POA: Diagnosis not present

## 2018-07-06 ENCOUNTER — Other Ambulatory Visit: Payer: Self-pay | Admitting: Cardiovascular Disease

## 2018-07-08 ENCOUNTER — Other Ambulatory Visit: Payer: Self-pay | Admitting: Cardiovascular Disease

## 2018-07-08 DIAGNOSIS — M545 Low back pain: Secondary | ICD-10-CM | POA: Diagnosis not present

## 2018-07-08 DIAGNOSIS — Z96642 Presence of left artificial hip joint: Secondary | ICD-10-CM | POA: Diagnosis not present

## 2018-07-09 ENCOUNTER — Ambulatory Visit: Payer: PPO | Admitting: Urology

## 2018-07-09 DIAGNOSIS — C61 Malignant neoplasm of prostate: Secondary | ICD-10-CM

## 2018-07-10 ENCOUNTER — Other Ambulatory Visit: Payer: Self-pay | Admitting: Cardiovascular Disease

## 2018-07-10 DIAGNOSIS — Z08 Encounter for follow-up examination after completed treatment for malignant neoplasm: Secondary | ICD-10-CM | POA: Diagnosis not present

## 2018-07-10 DIAGNOSIS — Z85828 Personal history of other malignant neoplasm of skin: Secondary | ICD-10-CM | POA: Diagnosis not present

## 2018-08-06 DIAGNOSIS — M545 Low back pain: Secondary | ICD-10-CM | POA: Diagnosis not present

## 2018-08-06 DIAGNOSIS — Z96642 Presence of left artificial hip joint: Secondary | ICD-10-CM | POA: Diagnosis not present

## 2018-10-04 ENCOUNTER — Other Ambulatory Visit: Payer: Self-pay | Admitting: Cardiovascular Disease

## 2018-11-12 ENCOUNTER — Ambulatory Visit (INDEPENDENT_AMBULATORY_CARE_PROVIDER_SITE_OTHER): Payer: PPO | Admitting: Urology

## 2018-11-12 DIAGNOSIS — C61 Malignant neoplasm of prostate: Secondary | ICD-10-CM | POA: Diagnosis not present

## 2018-12-10 ENCOUNTER — Ambulatory Visit (INDEPENDENT_AMBULATORY_CARE_PROVIDER_SITE_OTHER): Payer: PPO | Admitting: Urology

## 2018-12-10 DIAGNOSIS — C61 Malignant neoplasm of prostate: Secondary | ICD-10-CM

## 2018-12-16 ENCOUNTER — Other Ambulatory Visit: Payer: Self-pay | Admitting: "Endocrinology

## 2018-12-16 DIAGNOSIS — E039 Hypothyroidism, unspecified: Secondary | ICD-10-CM | POA: Diagnosis not present

## 2018-12-16 DIAGNOSIS — R7303 Prediabetes: Secondary | ICD-10-CM | POA: Diagnosis not present

## 2018-12-16 DIAGNOSIS — E559 Vitamin D deficiency, unspecified: Secondary | ICD-10-CM | POA: Diagnosis not present

## 2018-12-16 DIAGNOSIS — E212 Other hyperparathyroidism: Secondary | ICD-10-CM | POA: Diagnosis not present

## 2018-12-17 LAB — T4, FREE: Free T4: 1.29 ng/dL (ref 0.82–1.77)

## 2018-12-17 LAB — COMPREHENSIVE METABOLIC PANEL
ALT: 16 IU/L (ref 0–44)
AST: 15 IU/L (ref 0–40)
Albumin/Globulin Ratio: 1.4 (ref 1.2–2.2)
Albumin: 3.8 g/dL (ref 3.7–4.7)
Alkaline Phosphatase: 51 IU/L (ref 39–117)
BUN/Creatinine Ratio: 11 (ref 10–24)
BUN: 20 mg/dL (ref 8–27)
Bilirubin Total: 0.5 mg/dL (ref 0.0–1.2)
CO2: 23 mmol/L (ref 20–29)
Calcium: 9.4 mg/dL (ref 8.6–10.2)
Chloride: 103 mmol/L (ref 96–106)
Creatinine, Ser: 1.86 mg/dL — ABNORMAL HIGH (ref 0.76–1.27)
GFR calc Af Amer: 39 mL/min/{1.73_m2} — ABNORMAL LOW (ref >59)
GFR calc non Af Amer: 34 mL/min/{1.73_m2} — ABNORMAL LOW (ref >59)
Globulin, Total: 2.7 g/dL (ref 1.5–4.5)
Glucose: 133 mg/dL — ABNORMAL HIGH (ref 65–99)
Potassium: 4.1 mmol/L (ref 3.5–5.2)
Sodium: 141 mmol/L (ref 134–144)
Total Protein: 6.5 g/dL (ref 6.0–8.5)

## 2018-12-17 LAB — PTH, INTACT AND CALCIUM: PTH: 36 pg/mL (ref 15–65)

## 2018-12-17 LAB — HGB A1C W/O EAG: Hgb A1c MFr Bld: 5.9 % — ABNORMAL HIGH (ref 4.8–5.6)

## 2018-12-17 LAB — TSH: TSH: 2.74 u[IU]/mL (ref 0.450–4.500)

## 2018-12-17 LAB — SPECIMEN STATUS REPORT

## 2018-12-17 LAB — VITAMIN D 25 HYDROXY (VIT D DEFICIENCY, FRACTURES): Vit D, 25-Hydroxy: 31.5 ng/mL (ref 30.0–100.0)

## 2018-12-25 ENCOUNTER — Telehealth: Payer: Self-pay | Admitting: Cardiovascular Disease

## 2018-12-25 NOTE — Telephone Encounter (Signed)
New Message  Patient's son [name]) is calling in to get approval for his brother [name] [name] to accompany patient to his appointment on 12/30/18 at 9:00 am with Dr. Loletha Grayer. States that patient is elderly and needs someone to accompany him to that appointment. Please give patient's son a call back to confirm.

## 2018-12-25 NOTE — Telephone Encounter (Signed)
LMTCB

## 2018-12-26 ENCOUNTER — Encounter: Payer: Self-pay | Admitting: "Endocrinology

## 2018-12-26 ENCOUNTER — Ambulatory Visit (INDEPENDENT_AMBULATORY_CARE_PROVIDER_SITE_OTHER): Payer: PPO | Admitting: "Endocrinology

## 2018-12-26 DIAGNOSIS — E039 Hypothyroidism, unspecified: Secondary | ICD-10-CM | POA: Diagnosis not present

## 2018-12-26 DIAGNOSIS — E559 Vitamin D deficiency, unspecified: Secondary | ICD-10-CM

## 2018-12-26 DIAGNOSIS — E212 Other hyperparathyroidism: Secondary | ICD-10-CM

## 2018-12-26 DIAGNOSIS — R7303 Prediabetes: Secondary | ICD-10-CM

## 2018-12-26 MED ORDER — CALCIUM 600+D3 600-800 MG-UNIT PO TABS: 1.0000 | ORAL_TABLET | Freq: Two times a day (BID) | ORAL | 1 refills | Status: DC

## 2018-12-26 MED ORDER — LEVOTHYROXINE SODIUM 88 MCG PO TABS: 88.0000 ug | ORAL_TABLET | Freq: Every day | ORAL | 1 refills | Status: DC

## 2018-12-26 MED ORDER — CALCIUM 600+D3 600-800 MG-UNIT PO TABS: 1.0000 | ORAL_TABLET | Freq: Two times a day (BID) | ORAL | 1 refills | Status: AC

## 2018-12-26 NOTE — Progress Notes (Signed)
12/26/2018                                Endocrinology Telehealth Visit Follow up Note -During COVID -19 Pandemic  I connected with [name] [name] on 12/26/2018   by telephone and verified that I am speaking with the correct person using two identifiers. [place], 01/04/1941. he has verbally consented to this visit. All issues noted in this document were discussed and addressed. The format was not optimal for physical exam.    Subjective:    Patient ID: [name] [name], male    DOB: [birth year], PCP [name], MD   Past Medical History:  Diagnosis Date  . Cardiomyopathy [facility])    h/o felt 2nd atrial fib, EF normal by echo 2013  . CHF (congestive heart failure) ([place])   . Chronic anticoagulation   . Chronic atrial fibrillation ([place])   . Chronic kidney disease (CKD), stage IV (severe) ([place])   . Dysrhythmia   . Hypothyroidism   . Pneumonia   . Pre-diabetes   . Primary localized osteoarthritis of left hip 05/02/2016  . Prostate cancer ([place])   . Shortness of breath   . Systemic hypertension    Past Surgical History:  Procedure Laterality Date  . CARDIOVERSION    . CATARACT EXTRACTION W/PHACO  04/17/2011   Procedure: CATARACT EXTRACTION PHACO AND INTRAOCULAR LENS PLACEMENT (IOC);  Surgeon: [name], MD;  Location: [facility];  Service: Ophthalmology;  Laterality: Left;  CDE:14.58  . CATARACT EXTRACTION W/PHACO  05/22/2011   Procedure: CATARACT EXTRACTION PHACO AND INTRAOCULAR LENS PLACEMENT (IOC);  Surgeon: [name], MD;  Location: [facility];  Service: Ophthalmology;  Laterality: Right;  CDE 13.02  . COLONOSCOPY  01/17/2011   Procedure: COLONOSCOPY;  Surgeon: Jamesetta So;  Location: [facility];  Service: Gastroenterology;  Laterality: N/A;  . COLONOSCOPY N/A 02/24/2016   Procedure: COLONOSCOPY;  Surgeon: Rogene Houston, MD;  Location: [facility];  Service: Endoscopy;  Laterality: N/A;  12:50  . INGUINAL HERNIA REPAIR Right 09/27/2015   Procedure: RIGHT INGUINAL  HERNIORRHAPY WITH MESH SMALL BOWEL RESECTION;  Surgeon: Aviva Signs, MD;  Location: [facility];  Service: General;  Laterality: Right;  . NM MYOCAR [place]  02/2009   dipyridamole; mild perfusion defect due to infarct/scar with mild perinfarct ischemia is apical septal, apical basal inferoseptal, basal inferior, mid inferoseptal mid inferior & apical inferior; EKG negative for ischemia; low risk scan  . PROSTATE SURGERY     7 years ago [facility] Dr Javaid-prostatectomy  . PROSTATECTOMY    . Sleep Study  05/2011   increased upper airway resistance syndrome with mild sleep apnea during REM  . TOTAL HIP ARTHROPLASTY Left 05/02/2016   Procedure: LEFT TOTAL HIP ARTHROPLASTY;  Surgeon: Marchia Bond, MD;  Location: [facility];  Service: Orthopedics;  Laterality: Left;  . TRANSTHORACIC ECHOCARDIOGRAM  04/2011   EF=50-55%; RV mildly dilated; LA mild-mod dilated; mild mitral valve annular calcif, mild MR; mod TR, RVSP elevated 30-47mmHg, mild pulm HTN; AV mildly sclerotic, mild calcif of AV leaflets; trace pulm valve regurg    Social History   Socioeconomic History  . Marital status: Single    Spouse name: Not on file  . Number of children: 2  . Years of education: Not on file  . Highest education level: Not on file  Occupational History  . Not on file  Tobacco Use  .  Smoking status: Never Smoker  . Smokeless tobacco: Never Used  Substance and Sexual Activity  . Alcohol use: No  . Drug use: No  . Sexual activity: Yes    Birth control/protection: None  Other Topics Concern  . Not on file  Social History Narrative  . Not on file   Social Determinants of Health   Financial Resource Strain:   . Difficulty of Paying Living Expenses: Not on file  Food Insecurity:   . Worried About Charity fundraiser in the Last Year: Not on file  . Ran Out of Food in the Last Year: Not on file  Transportation Needs:   . Lack of Transportation (Medical): Not on file  . Lack of Transportation (Non-Medical): Not  on file  Physical Activity:   . Days of Exercise per Week: Not on file  . Minutes of Exercise per Session: Not on file  Stress:   . Feeling of Stress : Not on file  Social Connections:   . Frequency of Communication with Friends and Family: Not on file  . Frequency of Social Gatherings with Friends and Family: Not on file  . Attends Religious Services: Not on file  . Active Member of Clubs or Organizations: Not on file  . Attends Archivist Meetings: Not on file  . Marital Status: Not on file   Outpatient Encounter Medications as of 12/26/2018  Medication Sig  . Calcium Carb-Cholecalciferol (CALCIUM 600+D3) 600-800 MG-UNIT TABS Take 1 tablet by mouth 2 (two) times daily.  . ferrous sulfate (QC FERROUS SULFATE) 325 (65 FE) MG tablet Take 325 mg by mouth daily with breakfast.  . levothyroxine (SYNTHROID) 88 MCG tablet Take 1 tablet (88 mcg total) by mouth daily before breakfast.  . metoprolol tartrate (LOPRESSOR) 50 MG tablet Take 1 tablet (50 mg total) by mouth 2 (two) times daily.  . sodium bicarbonate 650 MG tablet Take 650 mg by mouth 2 (two) times daily.  Alveda Reasons 15 MG TABS tablet TAKE 1 TABLET BY MOUTH ONCE DAILY WITH SUPPER  . [DISCONTINUED] Calcium Carb-Cholecalciferol (CALCIUM 600+D3) 600-800 MG-UNIT TABS Take 1 tablet by mouth 2 (two) times daily.  . [DISCONTINUED] Calcium Carb-Cholecalciferol (CALCIUM 600+D3) 600-800 MG-UNIT TABS Take 1 tablet by mouth 2 (two) times daily.  . [DISCONTINUED] levothyroxine (SYNTHROID) 88 MCG tablet Take 1 tablet (88 mcg total) by mouth daily before breakfast.  . [DISCONTINUED] levothyroxine (SYNTHROID, LEVOTHROID) 88 MCG tablet Take 1 tablet (88 mcg total) by mouth daily before breakfast.   Facility-Administered Encounter Medications as of 12/26/2018  Medication  . neomycin-polymyxin-dexameth (MAXITROL) 0.1 % ophth ointment   ALLERGIES: Allergies  Allergen Reactions  . No Known Allergies    VACCINATION STATUS:  There is no  immunization history on file for this patient.  HPI  78 yr old male with above medical history. He is being engaged in telehealth via telephone for hypothyroidism, hypocalcemia, vitamin D deficiency with history of tertiary hyperparathyroidism.   He was found to have primary hypothyroidism  approximate at age of 30 years and was initiated on levothyroxine currently at 88 mcg p.o. every morning.   -He is compliant, continues to feel better. He has no new complaints.  he denies cold/heat intolerance.  -He denies history of goiter, nor family history of thyroid cancer.  -  he survives prostate cancer. -he has history of tertiary hyperparathyroidism with PTH as high as 3 which is currently improving to 40 with supplement of calcium and vitamin D. His most recent calcium  is corrected at 32.2.  his prior 24 hour urine calcium has been  low at 57.   Review of Systems  Limited as above.  Objective:    There were no vitals taken for this visit.  Wt Readings from Last 3 Encounters:  12/25/17 268 lb (121.6 kg)  11/26/17 272 lb 6.4 oz (123.6 kg)  06/25/17 273 lb (123.8 kg)    Physical Exam     Results for orders placed or performed in visit on 12/16/18  Comprehensive metabolic panel  Result Value Ref Range   Glucose 133 (H) 65 - 99 mg/dL   BUN 20 8 - 27 mg/dL   Creatinine, Ser 1.86 (H) 0.76 - 1.27 mg/dL   GFR calc non Af Amer 34 (L) >59 mL/min/1.73   GFR calc Af Amer 39 (L) >59 mL/min/1.73   BUN/Creatinine Ratio 11 10 - 24   Sodium 141 134 - 144 mmol/L   Potassium 4.1 3.5 - 5.2 mmol/L   Chloride 103 96 - 106 mmol/L   CO2 23 20 - 29 mmol/L   Calcium 9.4 8.6 - 10.2 mg/dL   Total Protein 6.5 6.0 - 8.5 g/dL   Albumin 3.8 3.7 - 4.7 g/dL   Globulin, Total 2.7 1.5 - 4.5 g/dL   Albumin/Globulin Ratio 1.4 1.2 - 2.2   Bilirubin Total 0.5 0.0 - 1.2 mg/dL   Alkaline Phosphatase 51 39 - 117 IU/L   AST 15 0 - 40 IU/L   ALT 16 0 - 44 IU/L  PTH, intact and calcium  Result Value Ref Range    PTH 36 15 - 65 pg/mL   PTH Interp Comment   Hgb A1c w/o eAG  Result Value Ref Range   Hgb A1c MFr Bld 5.9 (H) 4.8 - 5.6 %  T4, free  Result Value Ref Range   Free T4 1.29 0.82 - 1.77 ng/dL  TSH  Result Value Ref Range   TSH 2.740 0.450 - 4.500 uIU/mL  VITAMIN D 25 Hydroxy (Vit-D Deficiency, Fractures)  Result Value Ref Range   Vit D, 25-Hydroxy 31.5 30.0 - 100.0 ng/mL  Specimen status report  Result Value Ref Range   specimen status report Comment     Lipid Panel     Component Value Date/Time   CHOL  02/04/2009 0538    142        ATP III CLASSIFICATION:  <200     mg/dL   Desirable  200-239  mg/dL   Borderline High  >=240    mg/dL   High          TRIG 122 02/04/2009 0538   HDL 33 (L) 02/04/2009 0538   CHOLHDL 4.3 02/04/2009 0538   VLDL 24 02/04/2009 0538   LDLCALC  02/04/2009 0538    85        Total Cholesterol/HDL:CHD Risk Coronary Heart Disease Risk Table                     Men   Women  1/2 Average Risk   3.4   3.3  Average Risk       5.0   4.4  2 X Average Risk   9.6   7.1  3 X Average Risk  23.4   11.0        Use the calculated Patient Ratio above and the CHD Risk Table to determine the patient's CHD Risk.        ATP III CLASSIFICATION (LDL):  <100  mg/dL   Optimal  100-129  mg/dL   Near or Above                    Optimal  130-159  mg/dL   Borderline  160-189  mg/dL   High  >190     mg/dL   Very High      Assessment & Plan:   1.  Hypothyroidism -His previsit thyroid function tests are consistent with appropriate replacement.  He is advised to continue levothyroxine 88 mcg p.o. daily before breakfast.     - We discussed about the correct intake of his thyroid hormone, on empty stomach at fasting, with water, separated by at least 30 minutes from breakfast and other medications,  and separated by more than 4 hours from calcium, iron, multivitamins, acid reflux medications (PPIs). -Patient is made aware of the fact that thyroid hormone  replacement is needed for life, dose to be adjusted by periodic monitoring of thyroid function tests.   2. Tertiary hyperparathyroidism    His last PTH is stable at 36 associated with a normal calcium of 9.4 mg per DL.    He is now vitamin D replete at 62.  He is advised to maintain with over-the-counter vitamin D and calcium supplements.  He does not need high-dose prescription strength vitamin D. His prior 24 hour urine calcium has been  low at 57  3. Hypertension:  I advised him to continue losartan and carvedilol.  5.  Obesity with prediabetes At risk for diabetes.  He has regained his weight that he lost status post surgery for small bowel obstruction.  -His previsit labs show A1c of 5.9%.  He does not need medication intervention at this time.  - he  admits there is a room for improvement in his diet and drink choices. -  Suggestion is made for him to avoid simple carbohydrates  from his diet including Cakes, Sweet Desserts / Pastries, Ice Cream, Soda (diet and regular), Sweet Tea, Candies, Chips, Cookies, Sweet Pastries,  Store Bought Juices, Alcohol in Excess of  1-2 drinks a day, Artificial Sweeteners, Coffee Creamer, and "Sugar-free" Products. This will help patient to have stable blood glucose profile and potentially avoid unintended weight gain.   - I advised patient to maintain close follow up with [name], MD for primary care needs.   Time for this visit: 15 minutes. [name] [name]  participated in the discussions, expressed understanding, and voiced agreement with the above plans.  All questions were answered to his satisfaction. he is encouraged to contact clinic should he have any questions or concerns prior to his return visit.  Follow up plan: Return in about 6 months (around 06/26/2019) for Follow up with Pre-visit Labs, Next Visit A1c in Office.  Glade Lloyd, MD Phone: [phone]  Fax: [phone]   -  This note was partially dictated with voice  recognition software. Similar sounding words can be transcribed inadequately or may not  be corrected upon review.  12/26/2018, 9:31 AM

## 2018-12-26 NOTE — Telephone Encounter (Signed)
lp's son message that patient's son may accompany him to OV on 12/21 with Dr Sallyanne Kuster.

## 2018-12-30 ENCOUNTER — Ambulatory Visit (INDEPENDENT_AMBULATORY_CARE_PROVIDER_SITE_OTHER): Payer: PPO | Admitting: Cardiovascular Disease

## 2018-12-30 ENCOUNTER — Encounter: Payer: Self-pay | Admitting: Cardiovascular Disease

## 2018-12-30 ENCOUNTER — Other Ambulatory Visit: Payer: Self-pay

## 2018-12-30 VITALS — BP 102/73 | HR 60 | Temp 97.3°F | Ht 74.5 in | Wt 273.4 lb

## 2018-12-30 DIAGNOSIS — N1832 Chronic kidney disease, stage 3b: Secondary | ICD-10-CM

## 2018-12-30 DIAGNOSIS — E039 Hypothyroidism, unspecified: Secondary | ICD-10-CM

## 2018-12-30 DIAGNOSIS — I482 Chronic atrial fibrillation, unspecified: Secondary | ICD-10-CM

## 2018-12-30 DIAGNOSIS — Z8679 Personal history of other diseases of the circulatory system: Secondary | ICD-10-CM | POA: Diagnosis not present

## 2018-12-30 DIAGNOSIS — E782 Mixed hyperlipidemia: Secondary | ICD-10-CM

## 2018-12-30 DIAGNOSIS — Z7901 Long term (current) use of anticoagulants: Secondary | ICD-10-CM

## 2018-12-30 DIAGNOSIS — E669 Obesity, unspecified: Secondary | ICD-10-CM

## 2018-12-30 NOTE — Progress Notes (Signed)
Cardiology Office Note    Date:  12/30/2018   ID:  [name] [name], DOB 12-15-1940, MRN [id number]  PCP:  [name], MD  Cardiologist:   [name], MD   Chief Complaint  Patient presents with  . Atrial Fibrillation    History of Present Illness:  [name] [name] is a 78 y.o. 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   He is generally doing well.  He loves walking outside but has been afraid to do so because of the coronavirus pandemic and is walking on a treadmill.  He has gained some weight.  His level of dyspnea is unchanged.  He denies angina, edema, syncope, palpitations, falls, bleeding events, head injuries, focal neurological events or intermittent claudication.  He does not have orthopnea or PND.  His nephrologist is Dr. Lowanda Foster) and his endocrinologist is Dr. Dorris Fetch.  On 12/16/2018, hemoglobin A1c was 5.9% and creatinine 1.86 (both improved).  His lipid profile on 07/03/2018 was not as favorable with a total cholesterol 217, HDL 40, LDL 126, triglycerides 254.  Hemoglobin was 13.7.  A nuclear stress test in 2011 showed reported scar with mild peri-infarct ischemia in the apex and the inferior wall. It was felt to be a low risk scan since no significant ischemia was demonstrated. In August 2017 left ventricular ejection fraction by echo was 50-55% without regional wall motion abnormalities. Both atria were mildly dilated. No significant valvular abnormalities were reported. 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  Past Medical History:  Diagnosis Date  . Cardiomyopathy [facility])    h/o felt 2nd atrial fib, EF normal by echo 2013  . CHF  (congestive heart failure) ([place])   . Chronic anticoagulation   . Chronic atrial fibrillation ([place])   . Chronic kidney disease (CKD), stage IV (severe) ([place])   . Dysrhythmia   . Hypothyroidism   . Pneumonia   . Pre-diabetes   . Primary localized osteoarthritis of left hip 05/02/2016  . Prostate cancer ([place])   . Shortness of breath   . Systemic hypertension     Past Surgical History:  Procedure Laterality Date  . CARDIOVERSION    . CATARACT EXTRACTION W/PHACO  04/17/2011   Procedure: CATARACT EXTRACTION PHACO AND INTRAOCULAR LENS PLACEMENT (IOC);  Surgeon: [name], MD;  Location: [facility];  Service: Ophthalmology;  Laterality: Left;  CDE:14.58  . CATARACT EXTRACTION W/PHACO  05/22/2011   Procedure: CATARACT EXTRACTION PHACO AND INTRAOCULAR LENS PLACEMENT (IOC);  Surgeon: [name], MD;  Location: [facility];  Service: Ophthalmology;  Laterality: Right;  CDE 13.02  . COLONOSCOPY  01/17/2011   Procedure: COLONOSCOPY;  Surgeon: Jamesetta So;  Location: [facility];  Service: Gastroenterology;  Laterality: N/A;  . COLONOSCOPY N/A 02/24/2016   Procedure: COLONOSCOPY;  Surgeon: Rogene Houston, MD;  Location: [facility];  Service: Endoscopy;  Laterality: N/A;  12:50  . INGUINAL HERNIA REPAIR Right 09/27/2015   Procedure: RIGHT INGUINAL HERNIORRHAPY WITH MESH SMALL BOWEL RESECTION;  Surgeon: Aviva Signs, MD;  Location: [facility];  Service: General;  Laterality: Right;  . NM [place]  02/2009   dipyridamole; mild perfusion defect due to infarct/scar with mild perinfarct ischemia is apical septal, apical basal inferoseptal, basal inferior, mid inferoseptal  mid inferior & apical inferior; EKG negative for ischemia; low risk scan  . PROSTATE SURGERY     7 years ago [facility] Dr Javaid-prostatectomy  . PROSTATECTOMY    . Sleep Study  05/2011   increased upper airway resistance syndrome with mild sleep apnea during REM  . TOTAL HIP ARTHROPLASTY Left 05/02/2016   Procedure: LEFT TOTAL HIP  ARTHROPLASTY;  Surgeon: Marchia Bond, MD;  Location: [place];  Service: Orthopedics;  Laterality: Left;  . TRANSTHORACIC ECHOCARDIOGRAM  04/2011   EF=50-55%; RV mildly dilated; LA mild-mod dilated; mild mitral valve annular calcif, mild MR; mod TR, RVSP elevated 30-16mmHg, mild pulm HTN; AV mildly sclerotic, mild calcif of AV leaflets; trace pulm valve regurg     Current Medications: Outpatient Medications Prior to Visit  Medication Sig Dispense Refill  . Calcium Carb-Cholecalciferol (CALCIUM 600+D3) 600-800 MG-UNIT TABS Take 1 tablet by mouth 2 (two) times daily. 90 tablet 1  . ferrous sulfate (QC FERROUS SULFATE) 325 (65 FE) MG tablet Take 325 mg by mouth daily with breakfast.    . levothyroxine (SYNTHROID) 88 MCG tablet Take 1 tablet (88 mcg total) by mouth daily before breakfast. 90 tablet 1  . metoprolol tartrate (LOPRESSOR) 50 MG tablet Take 1 tablet (50 mg total) by mouth 2 (two) times daily. 180 tablet 0  . sodium bicarbonate 650 MG tablet Take 650 mg by mouth 2 (two) times daily.    Alveda Reasons 15 MG TABS tablet TAKE 1 TABLET BY MOUTH ONCE DAILY WITH SUPPER 90 tablet 1   Facility-Administered Medications Prior to Visit  Medication Dose Route Frequency Provider Last Rate Last Admin  . neomycin-polymyxin-dexameth (MAXITROL) 0.1 % ophth ointment    PRN [name], MD   1 application at 70/01/74 9449     Allergies:   No known allergies   Social History   Socioeconomic History  . Marital status: Single    Spouse name: Not on file  . Number of children: 2  . Years of education: Not on file  . Highest education level: Not on file  Occupational History  . Not on file  Tobacco Use  . Smoking status: Never Smoker  . Smokeless tobacco: Never Used  Substance and Sexual Activity  . Alcohol use: No  . Drug use: No  . Sexual activity: Yes    Birth control/protection: None  Other Topics Concern  . Not on file  Social History Narrative  . Not on file   Social Determinants of Health    Financial Resource Strain:   . Difficulty of Paying Living Expenses: Not on file  Food Insecurity:   . Worried About Charity fundraiser in the Last Year: Not on file  . Ran Out of Food in the Last Year: Not on file  Transportation Needs:   . Lack of Transportation (Medical): Not on file  . Lack of Transportation (Non-Medical): Not on file  Physical Activity:   . Days of Exercise per Week: Not on file  . Minutes of Exercise per Session: Not on file  Stress:   . Feeling of Stress : Not on file  Social Connections:   . Frequency of Communication with Friends and Family: Not on file  . Frequency of Social Gatherings with Friends and Family: Not on file  . Attends Religious Services: Not on file  . Active Member of Clubs or Organizations: Not on file  . Attends Archivist Meetings: Not on file  . Marital Status: Not on file  Family History:  The patient's  family history includes Diabetes in his mother; Prostate cancer in his father.   ROS:   Please see the history of present illness.    ROS All other systems are reviewed and are negative.   PHYSICAL EXAM:   VS:  BP 102/73   Pulse 60   Temp (!) 97.3 F (36.3 C)   Ht 6' 2.5" (1.892 m)   Wt 273 lb 6.4 oz (124 kg)   SpO2 92%   BMI 34.63 kg/m     General: Alert, oriented x3, no distress, obese Head: no evidence of trauma, PERRL, EOMI, no exophtalmos or lid lag, no myxedema, no xanthelasma; normal ears, nose and oropharynx Neck: normal jugular venous pulsations and no hepatojugular reflux; brisk carotid pulses without delay and no carotid bruits Chest: clear to auscultation, no signs of consolidation by percussion or palpation, normal fremitus, symmetrical and full respiratory excursions Cardiovascular: normal position and quality of the apical impulse, irregular rhythm, normal first and second heart sounds, no murmurs, rubs or gallops Abdomen: no tenderness or distention, no masses by palpation, no abnormal  pulsatility or arterial bruits, normal bowel sounds, no hepatosplenomegaly Extremities: no clubbing, cyanosis or edema; 2+ radial, ulnar and brachial pulses bilaterally; 2+ right femoral, posterior tibial and dorsalis pedis pulses; 2+ left femoral, posterior tibial and dorsalis pedis pulses; no subclavian or femoral bruits Neurological: grossly nonfocal Psych: Normal mood and affect   Wt Readings from Last 3 Encounters:  12/30/18 273 lb 6.4 oz (124 kg)  12/25/17 268 lb (121.6 kg)  11/26/17 272 lb 6.4 oz (123.6 kg)      Studies/Labs Reviewed:   EKG:  EKG is ordered today.    Recent Labs: 12/16/2018: ALT 16; BUN 20; Creatinine, Ser 1.86; Potassium 4.1; Sodium 141; TSH 2.740   Lipid Panel    Component Value Date/Time   CHOL  02/04/2009 0538    142        ATP III CLASSIFICATION:  <200     mg/dL   Desirable  200-239  mg/dL   Borderline High  >=240    mg/dL   High          TRIG 122 02/04/2009 0538   HDL 33 (L) 02/04/2009 0538   CHOLHDL 4.3 02/04/2009 0538   VLDL 24 02/04/2009 0538   LDLCALC  02/04/2009 0538    85        Total Cholesterol/HDL:CHD Risk Coronary Heart Disease Risk Table                     Men   Women  1/2 Average Risk   3.4   3.3  Average Risk       5.0   4.4  2 X Average Risk   9.6   7.1  3 X Average Risk  23.4   11.0        Use the calculated Patient Ratio above and the CHD Risk Table to determine the patient's CHD Risk.        ATP III CLASSIFICATION (LDL):  <100     mg/dL   Optimal  100-129  mg/dL   Near or Above                    Optimal  130-159  mg/dL   Borderline  160-189  mg/dL   High  >190     mg/dL   Very High    His lipid profile on 07/03/2018 was not  as favorable with a total cholesterol 217, HDL 40, LDL 126, triglycerides 254.  Hemoglobin was 13.7.  ASSESSMENT:    1. Chronic a-fib ([place])   2. History of cardiomyopathy   3. Long term current use of anticoagulant   4. Acquired hypothyroidism   5. Stage 3b chronic kidney disease   6.  Mixed hyperlipidemia   7. Mild obesity      PLAN:  In order of problems listed above:  1. AFib: Adequate rate control. CHADSVasc 4 (age 20, HTN, HF). Rate control is critical in view of his history of tachycardia related cardiomyopathy. 2. history of orthostatic syncope: This improved after correction of his anemia, no recent recurrence. 3. Xarelto: Dose appropriately adjusted for renal function.  No history of stroke/TIA.  Has known hemorrhoids and diverticulosis, no recent bleeding events. 4. Hypothyroidism: Recent TSH shows therapeutic dose of levothyroxine. 5. CKD stage 3-4: Stable creatinine at about 1.8-2.2 (GFR 30-35). 6. HLP: Discussed his lipid profile.  His LDL is close to the range where we would recommend pharmacological therapy.  He does not have full-blown diabetes but does have prediabetes.  Triglycerides are also high.  All these values would improve with weight loss.    Medication Adjustments/Labs and Tests Ordered: Current medicines are reviewed at length with the patient today.  Concerns regarding medicines are outlined above.  Medication changes, Labs and Tests ordered today are listed in the Patient Instructions below. Patient Instructions  Medication Instructions:  No changes *If you need a refill on your cardiac medications before your next appointment, please call your pharmacy*  Lab Work: None ordered If you have labs (blood work) drawn today and your tests are completely normal, you will receive your results only by: Marland Kitchen MyChart Message (if you have MyChart) OR . A paper copy in the mail If you have any lab test that is abnormal or we need to change your treatment, we will call you to review the results.  Testing/Procedures: None ordered  Follow-Up: At [facility], you and your health needs are our priority.  As part of our continuing mission to provide you with exceptional heart care, we have created designated Provider Care Teams.  These Care Teams  include your primary Cardiologist (physician) and Advanced Practice Providers (APPs -  Physician Assistants and Nurse Practitioners) who all work together to provide you with the care you need, when you need it.  Your next appointment:   12 month(s)  The format for your next appointment:   Either In Person or Virtual  Provider:   [name], MD       Signed, [name], MD  12/30/2018 9:45 AM    [facility] [address], [place], [place]  [address] Phone: [phone]; Fax: [phone]

## 2018-12-30 NOTE — Patient Instructions (Signed)
Medication Instructions:  No changes *If you need a refill on your cardiac medications before your next appointment, please call your pharmacy*  Lab Work: None ordered If you have labs (blood work) drawn today and your tests are completely normal, you will receive your results only by: Marland Kitchen MyChart Message (if you have MyChart) OR . A paper copy in the mail If you have any lab test that is abnormal or we need to change your treatment, we will call you to review the results.  Testing/Procedures: None ordered  Follow-Up: At [facility], you and your health needs are our priority.  As part of our continuing mission to provide you with exceptional heart care, we have created designated Provider Care Teams.  These Care Teams include your primary Cardiologist (physician) and Advanced Practice Providers (APPs -  Physician Assistants and Nurse Practitioners) who all work together to provide you with the care you need, when you need it.  Your next appointment:   12 month(s)  The format for your next appointment:   Either In Person or Virtual  Provider:   [name], MD

## 2019-01-04 ENCOUNTER — Other Ambulatory Visit: Payer: Self-pay | Admitting: Cardiovascular Disease

## 2019-01-06 ENCOUNTER — Other Ambulatory Visit: Payer: Self-pay | Admitting: Cardiovascular Disease

## 2019-01-07 NOTE — Telephone Encounter (Signed)
77m 124kg Scr 1.86 12/16/18 ccr 57.4 Pt requesting a refill on 15mg  xarelto but qualifies for 20mg . Will need a pharmd review.

## 2019-01-08 NOTE — Telephone Encounter (Signed)
Scr ~ 2.2 for past 2 years. Only 1 results under 2 was on 12/16/2018  Will continue current dose and repeat BMET in 6 months

## 2019-01-14 ENCOUNTER — Ambulatory Visit (INDEPENDENT_AMBULATORY_CARE_PROVIDER_SITE_OTHER): Payer: PPO

## 2019-01-14 ENCOUNTER — Other Ambulatory Visit: Payer: Self-pay

## 2019-01-14 DIAGNOSIS — D631 Anemia in chronic kidney disease: Secondary | ICD-10-CM | POA: Diagnosis not present

## 2019-01-14 DIAGNOSIS — N1832 Chronic kidney disease, stage 3b: Secondary | ICD-10-CM | POA: Diagnosis not present

## 2019-01-14 DIAGNOSIS — E559 Vitamin D deficiency, unspecified: Secondary | ICD-10-CM | POA: Diagnosis not present

## 2019-01-14 DIAGNOSIS — Z79899 Other long term (current) drug therapy: Secondary | ICD-10-CM | POA: Diagnosis not present

## 2019-01-14 DIAGNOSIS — C61 Malignant neoplasm of prostate: Secondary | ICD-10-CM

## 2019-01-14 DIAGNOSIS — R809 Proteinuria, unspecified: Secondary | ICD-10-CM | POA: Diagnosis not present

## 2019-01-14 MED ORDER — DENOSUMAB 120 MG/1.7ML ~~LOC~~ SOLN
120.0000 mg | Freq: Once | SUBCUTANEOUS | Status: AC
  Administered 2019-01-14: 120 mg via SUBCUTANEOUS

## 2019-01-14 NOTE — Progress Notes (Signed)
Tolerated procedure well. F/U 1 month NV for [facility]

## 2019-01-22 DIAGNOSIS — I129 Hypertensive chronic kidney disease with stage 1 through stage 4 chronic kidney disease, or unspecified chronic kidney disease: Secondary | ICD-10-CM | POA: Diagnosis not present

## 2019-01-22 DIAGNOSIS — E875 Hyperkalemia: Secondary | ICD-10-CM | POA: Diagnosis not present

## 2019-01-22 DIAGNOSIS — E559 Vitamin D deficiency, unspecified: Secondary | ICD-10-CM | POA: Diagnosis not present

## 2019-01-22 DIAGNOSIS — Z08 Encounter for follow-up examination after completed treatment for malignant neoplasm: Secondary | ICD-10-CM | POA: Diagnosis not present

## 2019-01-22 DIAGNOSIS — R809 Proteinuria, unspecified: Secondary | ICD-10-CM | POA: Diagnosis not present

## 2019-01-22 DIAGNOSIS — E211 Secondary hyperparathyroidism, not elsewhere classified: Secondary | ICD-10-CM | POA: Diagnosis not present

## 2019-01-22 DIAGNOSIS — Z85828 Personal history of other malignant neoplasm of skin: Secondary | ICD-10-CM | POA: Diagnosis not present

## 2019-02-18 ENCOUNTER — Ambulatory Visit (INDEPENDENT_AMBULATORY_CARE_PROVIDER_SITE_OTHER): Payer: PPO

## 2019-02-18 ENCOUNTER — Other Ambulatory Visit: Payer: Self-pay

## 2019-02-18 VITALS — Temp 97.5°F

## 2019-02-18 DIAGNOSIS — C61 Malignant neoplasm of prostate: Secondary | ICD-10-CM | POA: Diagnosis not present

## 2019-02-18 MED ORDER — DENOSUMAB 120 MG/1.7ML ~~LOC~~ SOLN
120.0000 mg | Freq: Once | SUBCUTANEOUS | Status: AC
  Administered 2019-02-18: 09:00:00 120 mg via SUBCUTANEOUS

## 2019-02-18 NOTE — Progress Notes (Signed)
Pt came in for 1 month xgeva and tolerated procedure well.

## 2019-02-18 NOTE — Addendum Note (Signed)
Addended byIris Pert on: 02/18/2019 08:32 AM   Modules accepted: Level of Service

## 2019-03-05 ENCOUNTER — Telehealth: Payer: Self-pay | Admitting: Urology

## 2019-03-05 NOTE — Telephone Encounter (Signed)
Office is closed on march 9th. Both appts combined but moved couple weeks later.

## 2019-03-05 NOTE — Telephone Encounter (Signed)
Pt is schedule for two appts on the same day with our office, one for a nurse xgeva shot and the next for an office visit for lupron. Do we just combine these appts? Or should we reschedule one of them?

## 2019-03-09 DIAGNOSIS — N184 Chronic kidney disease, stage 4 (severe): Secondary | ICD-10-CM | POA: Diagnosis not present

## 2019-03-09 DIAGNOSIS — I482 Chronic atrial fibrillation, unspecified: Secondary | ICD-10-CM | POA: Diagnosis not present

## 2019-03-09 DIAGNOSIS — I129 Hypertensive chronic kidney disease with stage 1 through stage 4 chronic kidney disease, or unspecified chronic kidney disease: Secondary | ICD-10-CM | POA: Diagnosis not present

## 2019-03-09 DIAGNOSIS — C61 Malignant neoplasm of prostate: Secondary | ICD-10-CM | POA: Diagnosis not present

## 2019-03-18 ENCOUNTER — Ambulatory Visit: Payer: PPO | Admitting: Urology

## 2019-03-18 ENCOUNTER — Ambulatory Visit: Payer: PPO

## 2019-03-25 ENCOUNTER — Other Ambulatory Visit: Payer: Self-pay

## 2019-03-25 ENCOUNTER — Encounter: Payer: Self-pay | Admitting: Urology

## 2019-03-25 ENCOUNTER — Other Ambulatory Visit: Payer: Self-pay | Admitting: Urology

## 2019-03-25 ENCOUNTER — Ambulatory Visit: Payer: PPO | Admitting: Urology

## 2019-03-25 VITALS — BP 123/69 | HR 108 | Temp 96.2°F

## 2019-03-25 DIAGNOSIS — Z87442 Personal history of urinary calculi: Secondary | ICD-10-CM | POA: Diagnosis not present

## 2019-03-25 DIAGNOSIS — C61 Malignant neoplasm of prostate: Secondary | ICD-10-CM

## 2019-03-25 LAB — POCT URINALYSIS DIPSTICK
Bilirubin, UA: NEGATIVE
Glucose, UA: NEGATIVE
Ketones, UA: NEGATIVE
Nitrite, UA: NEGATIVE
Protein, UA: POSITIVE — AB
Spec Grav, UA: 1.025 (ref 1.010–1.025)
Urobilinogen, UA: NEGATIVE E.U./dL — AB
pH, UA: 5 (ref 5.0–8.0)

## 2019-03-25 MED ORDER — LEUPROLIDE ACETATE (4 MONTH) 30 MG IM KIT
30.0000 mg | PACK | Freq: Once | INTRAMUSCULAR | Status: AC
  Administered 2019-03-25: 30 mg via INTRAMUSCULAR

## 2019-03-25 MED ORDER — DENOSUMAB 120 MG/1.7ML ~~LOC~~ SOLN
120.0000 mg | Freq: Once | SUBCUTANEOUS | Status: AC
  Administered 2019-03-25: 120 mg via SUBCUTANEOUS

## 2019-03-25 NOTE — Progress Notes (Signed)
Urological Symptom Review  Patient is experiencing the following symptoms: none   Review of Systems  Gastrointestinal (upper)  : Negative for upper GI symptoms  Gastrointestinal (lower) : Negative for lower GI symptoms  Constitutional : Night Sweats  Skin: Negative for skin symptoms  Eyes: Negative for eye symptoms  Ear/Nose/Throat : Sinus problems  Hematologic/Lymphatic: Negative for Hematologic/Lymphatic symptoms  Cardiovascular : Negative for cardiovascular symptoms  Respiratory : Shortness of breath  Endocrine: Negative for endocrine symptoms  Musculoskeletal: Joint pain  Neurological: Negative for neurological symptoms  Psychologic: Negative for psychiatric symptoms

## 2019-03-25 NOTE — Progress Notes (Signed)
H&P  Chief Complaint: Prostate Cancer  History of Present Illness:   3.16.2021: Here today for 4 mo lupron and 1 mo xgeva. No recent labs. He reports that, since last seen for OV, he has been doing quite well with no significant urinary complaints. He is having occasional hot flashes but these do not bother him. He denies any changes in weight, appetite, or energy level. No new aches or pains.   IPSS Questionnaire (AUA-7): Over the past month.   1)  How often have you had a sensation of not emptying your bladder completely after you finish urinating?  0 - Not at all  2)  How often have you had to urinate again less than two hours after you finished urinating? 1 - Less than 1 time in 5  3)  How often have you found you stopped and started again several times when you urinated?  0 - Not at all  4) How difficult have you found it to postpone urination?  0 - Not at all  5) How often have you had a weak urinary stream?  0 - Not at all  6) How often have you had to push or strain to begin urination?  0 - Not at all  7) How many times did you most typically get up to urinate from the time you went to bed until the time you got up in the morning?  2 - 2 times  Total score:  0-7 mildly symptomatic   8-19 moderately symptomatic   20-35 severely symptomatic   Total: 3 QoL: 2  (below copied from [facility] records):  Prostate Cancer:  Tarry Fountain is a 79 year-old male established patient who is here for prostate cancer which has been treated.  He did receive radiation therapy for his cancer. He was treated with xrt for his cancer. Patient denies brachytherapy and high dose radiation. His radiation treatment was complete approximately 08/09/2000.   Initial diagnosis 2002--PSA 12, apparently Gleason score 5 (2+3). 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  Underwent EBRT--35 treatments.   CT abdomen and pelvis on 11.20.2017, performed for  gross hematuria. This revealed an absent prostate, no evidence for recurrent tumor or metastatic disease within the abdomen or pelvis. Bilateral renal calculi noted. Sclerotic lesion involving L4 vertebra favored to represent a bone island.   2.12.2019: Most recent PSA is up to 5.7. He denies bony pain. He denies significant weight loss.  4.8.2019: Bone scan--abnml uptake in L4 vertebral bodyc/w met PCa  5.7.2019: CT (03/12/2017, 05/07/2017) revealed larger L4 sclerotic met w/ several new foci--L3, T12, left 8th rib, right ilium. No complaints of bony pain.   5.7.2019: He was started on ADT with Firmagon   6.25.2019: first Lupron injection.   10.29.2019: Receiving Xgeva on a monthly basis. He has not had recent laboratories drawn. He is having no blood in his urine or stool. He states that he has a good urinary stream. He has had some recent back pain, but he spent about an hour and half on a lawnmower recently. He is having hot flashes. Most recent PSA from June of this year, following initiation of androgen deprivation therapy was down to 0.2.   3.3.2020: Most recent PSA from October 27, 2017 was less than 0.1. He apparently had labs drawn last week, but the only lab returned was a basic metabolic panel. He has had hot flashes. He denies blood in his urine or stool. He  does have urgency and occasional urgency incontinence. No dysuria. He has a good stream. He has limited mobility.   11.3.2020: PSA 6.2020--<0.1. T level castrate. Here today for lupron. He has been having hot flashes but states that these are tolerable given the weather has gotten a lot colder. He is due a repeat PSA. He denies any dysuria or hematuria. He denies any new urinary complaints. He states that his boney pain has since resolved.    11/12/18 06/24/18 03/12/18 11/06/17 07/03/17 05/16/17 02/13/17 08/15/16 11/16/15  Total PSA <0.1 ng/dl < 0.1 ng/dl < 0.1 ng/dl < 0.1 ng/dl 0.2 ng/dl 7.2 ng/dl 5.7 ng/dl 2.6 ng/dl 0.7 ng/dl     11/12/2018 06/24/18 03/12/18 11/06/17 07/03/17 05/16/17  Testosterone, Total 24 pg/dL 19 pg/dL 21 pg/dL 115 pg/dL 20 pg/dL 142 pg/dL    Past Medical History:  Diagnosis Date  . Cardiomyopathy [facility])    h/o felt 2nd atrial fib, EF normal by echo 2013  . CHF (congestive heart failure) ([place])   . Chronic anticoagulation   . Chronic atrial fibrillation ([place])   . Chronic kidney disease (CKD), stage IV (severe) ([place])   . Dysrhythmia   . Hypothyroidism   . Pneumonia   . Pre-diabetes   . Primary localized osteoarthritis of left hip 05/02/2016  . Prostate cancer ([place])   . Shortness of breath   . Systemic hypertension     Past Surgical History:  Procedure Laterality Date  . CARDIOVERSION    . CATARACT EXTRACTION W/PHACO  04/17/2011   Procedure: CATARACT EXTRACTION PHACO AND INTRAOCULAR LENS PLACEMENT (IOC);  Surgeon: [name], MD;  Location: [facility];  Service: Ophthalmology;  Laterality: Left;  CDE:14.58  . CATARACT EXTRACTION W/PHACO  05/22/2011   Procedure: CATARACT EXTRACTION PHACO AND INTRAOCULAR LENS PLACEMENT (IOC);  Surgeon: [name], MD;  Location: [facility];  Service: Ophthalmology;  Laterality: Right;  CDE 13.02  . COLONOSCOPY  01/17/2011   Procedure: COLONOSCOPY;  Surgeon: Jamesetta So;  Location: [facility];  Service: Gastroenterology;  Laterality: N/A;  . COLONOSCOPY N/A 02/24/2016   Procedure: COLONOSCOPY;  Surgeon: Rogene Houston, MD;  Location: [facility];  Service: Endoscopy;  Laterality: N/A;  12:50  . INGUINAL HERNIA REPAIR Right 09/27/2015   Procedure: RIGHT INGUINAL HERNIORRHAPY WITH MESH SMALL BOWEL RESECTION;  Surgeon: Aviva Signs, MD;  Location: [facility];  Service: General;  Laterality: Right;  . NM MYOCAR [place]  02/2009   dipyridamole; mild perfusion defect due to infarct/scar with mild perinfarct ischemia is apical septal, apical basal inferoseptal, basal inferior, mid inferoseptal mid inferior & apical inferior; EKG negative for ischemia; low risk scan  .  PROSTATE SURGERY     7 years ago [facility] Dr Javaid-prostatectomy  . PROSTATECTOMY    . Sleep Study  05/2011   increased upper airway resistance syndrome with mild sleep apnea during REM  . TOTAL HIP ARTHROPLASTY Left 05/02/2016   Procedure: LEFT TOTAL HIP ARTHROPLASTY;  Surgeon: Marchia Bond, MD;  Location: [place];  Service: Orthopedics;  Laterality: Left;  . TRANSTHORACIC ECHOCARDIOGRAM  04/2011   EF=50-55%; RV mildly dilated; LA mild-mod dilated; mild mitral valve annular calcif, mild MR; mod TR, RVSP elevated 30-76mHg, mild pulm HTN; AV mildly sclerotic, mild calcif of AV leaflets; trace pulm valve regurg     Home Medications:  Allergies as of 03/25/2019      Reactions   No Known Allergies       Medication List       Accurate  as of March 25, 2019  9:34 AM. If you have any questions, ask your nurse or doctor.        Calcium 600+D3 600-800 MG-UNIT Tabs Generic drug: Calcium Carb-Cholecalciferol Take 1 tablet by mouth 2 (two) times daily. What changed: Another medication with the same name was removed. Continue taking this medication, and follow the directions you see here. Changed by: Jorja Loa, MD   Cholecalciferol 25 MCG (1000 UT) tablet Take by mouth.   levothyroxine 88 MCG tablet Commonly known as: SYNTHROID Take 1 tablet (88 mcg total) by mouth daily before breakfast. What changed: Another medication with the same name was removed. Continue taking this medication, and follow the directions you see here. Changed by: Jorja Loa, MD   metoprolol tartrate 50 MG tablet Commonly known as: LOPRESSOR Take 1 tablet by mouth twice daily What changed: Another medication with the same name was removed. Continue taking this medication, and follow the directions you see here. Changed by: Jorja Loa, MD   QC Ferrous Sulfate 325 (65 FE) MG tablet Generic drug: ferrous sulfate Take 325 mg by mouth daily with breakfast.   Rivaroxaban 15 MG Tabs  tablet Commonly known as: XARELTO TAKE 1 TABLET BY MOUTH ONCE DAILY WITH SUPPER What changed: Another medication with the same name was removed. Continue taking this medication, and follow the directions you see here. Changed by: Jorja Loa, MD   sodium bicarbonate 650 MG tablet Take 650 mg by mouth 2 (two) times daily.       Allergies:  Allergies  Allergen Reactions  . No Known Allergies     Family History  Problem Relation Age of Onset  . Diabetes Mother   . Prostate cancer Father   . Anesthesia problems Neg Hx   . Hypotension Neg Hx   . Malignant hyperthermia Neg Hx   . Pseudochol deficiency Neg Hx     Social History:  reports that he has never smoked. He has never used smokeless tobacco. He reports that he does not drink alcohol or use drugs.  ROS: A complete review of systems was performed.  All systems are negative except for pertinent findings as noted.  Physical Exam:  Vital signs in last 24 hours: BP 123/69   Pulse (!) 108   Temp (!) 96.2 F (35.7 C)  Constitutional:  Alert and oriented, No acute distress Cardiovascular: Regular rate  Respiratory: Normal respiratory effort GI: Abdomen is soft, nontender, nondistended, no abdominal masses. No CVAT.  Genitourinary: Stenotic anus, perineum is normal on inspection. Prostate feels smooth and flat.  Lymphatic: No lymphadenopathy Neurologic: Grossly intact, no focal deficits Psychiatric: Normal mood and affect  Laboratory Data:  No results for input(s): WBC, HGB, HCT, PLT in the last 72 hours.  No results for input(s): NA, K, CL, GLUCOSE, BUN, CALCIUM, CREATININE in the last 72 hours.  Invalid input(s): CO3   Results for orders placed or performed in visit on 03/25/19 (from the past 24 hour(s))  POCT urinalysis dipstick     Status: Abnormal   Collection Time: 03/25/19  8:58 AM  Result Value Ref Range   Color, UA lr yellow    Clarity, UA clear    Glucose, UA Negative Negative   Bilirubin, UA  neg    Ketones, UA neg    Spec Grav, UA 1.025 1.010 - 1.025   Blood, UA large    pH, UA 5.0 5.0 - 8.0   Protein, UA Positive (A) Negative  Urobilinogen, UA negative (A) 0.2 or 1.0 E.U./dL   Nitrite, UA neg    Leukocytes, UA Moderate (2+) (A) Negative   Appearance clear    Odor     No results found for this or any previous visit (from the past 240 hour(s)).  I have reviewed prior pt notes  I have reviewed notes from referring/previous physicians  I have reviewed urinalysis results  I have reviewed prior PSA results   Impression/Assessment:  He is due repeat labs. Xgeva and 4 mo injection given today. He is seemingly tolerating ADT well without significant complications or adverse effects.  Plan:  1. Xgeva given today and 4 mo lupron (or eligard) given today.  2. Return for monthly nurse visits for xgeva  3. Return in 4 mo for OV w/ lupron.   4. PSA and testosterone orders given today to have drawn per [facility].

## 2019-03-26 ENCOUNTER — Other Ambulatory Visit: Payer: Self-pay | Admitting: Urology

## 2019-03-26 LAB — PSA: Prostate Specific Ag, Serum: 0.1 ng/mL (ref 0.0–4.0)

## 2019-03-26 LAB — TESTOSTERONE: Testosterone: 26 ng/dL — ABNORMAL LOW (ref 264–916)

## 2019-03-28 NOTE — Progress Notes (Signed)
Results sent via my chart. Dr. Diona Fanti reviewed

## 2019-03-29 ENCOUNTER — Ambulatory Visit: Payer: PPO | Attending: Internal Medicine

## 2019-03-29 DIAGNOSIS — Z23 Encounter for immunization: Secondary | ICD-10-CM

## 2019-03-29 NOTE — Progress Notes (Signed)
   Covid-19 Vaccination Clinic  Name:  [name] [name]    MRN: [id number] DOB: June 26, 1940  03/29/2019  [name] was observed post Covid-19 immunization for 15 minutes without incident. He was provided with Vaccine Information Sheet and instruction to access the V-Safe system.   [name] was instructed to call 911 with any severe reactions post vaccine: Marland Kitchen Difficulty breathing  . Swelling of face and throat  . A fast heartbeat  . A bad rash all over body  . Dizziness and weakness   Immunizations Administered    Name Date Dose VIS Date Route   Moderna COVID-19 Vaccine 03/29/2019 10:01 AM 0.5 mL 12/10/2018 Intramuscular   Manufacturer: Moderna   Lot: [id number]   [place]: [id number]

## 2019-04-09 DIAGNOSIS — I482 Chronic atrial fibrillation, unspecified: Secondary | ICD-10-CM | POA: Diagnosis not present

## 2019-04-09 DIAGNOSIS — C61 Malignant neoplasm of prostate: Secondary | ICD-10-CM | POA: Diagnosis not present

## 2019-04-09 DIAGNOSIS — E1165 Type 2 diabetes mellitus with hyperglycemia: Secondary | ICD-10-CM | POA: Diagnosis not present

## 2019-04-09 DIAGNOSIS — E7849 Other hyperlipidemia: Secondary | ICD-10-CM | POA: Diagnosis not present

## 2019-04-09 DIAGNOSIS — N184 Chronic kidney disease, stage 4 (severe): Secondary | ICD-10-CM | POA: Diagnosis not present

## 2019-04-09 DIAGNOSIS — I10 Essential (primary) hypertension: Secondary | ICD-10-CM | POA: Diagnosis not present

## 2019-04-09 DIAGNOSIS — I129 Hypertensive chronic kidney disease with stage 1 through stage 4 chronic kidney disease, or unspecified chronic kidney disease: Secondary | ICD-10-CM | POA: Diagnosis not present

## 2019-04-15 DIAGNOSIS — E559 Vitamin D deficiency, unspecified: Secondary | ICD-10-CM | POA: Diagnosis not present

## 2019-04-15 DIAGNOSIS — E875 Hyperkalemia: Secondary | ICD-10-CM | POA: Diagnosis not present

## 2019-04-15 DIAGNOSIS — E211 Secondary hyperparathyroidism, not elsewhere classified: Secondary | ICD-10-CM | POA: Diagnosis not present

## 2019-04-15 DIAGNOSIS — I129 Hypertensive chronic kidney disease with stage 1 through stage 4 chronic kidney disease, or unspecified chronic kidney disease: Secondary | ICD-10-CM | POA: Diagnosis not present

## 2019-04-15 DIAGNOSIS — R809 Proteinuria, unspecified: Secondary | ICD-10-CM | POA: Diagnosis not present

## 2019-04-23 DIAGNOSIS — I129 Hypertensive chronic kidney disease with stage 1 through stage 4 chronic kidney disease, or unspecified chronic kidney disease: Secondary | ICD-10-CM | POA: Diagnosis not present

## 2019-04-23 DIAGNOSIS — E559 Vitamin D deficiency, unspecified: Secondary | ICD-10-CM | POA: Diagnosis not present

## 2019-04-23 DIAGNOSIS — R809 Proteinuria, unspecified: Secondary | ICD-10-CM | POA: Diagnosis not present

## 2019-04-23 DIAGNOSIS — E211 Secondary hyperparathyroidism, not elsewhere classified: Secondary | ICD-10-CM | POA: Diagnosis not present

## 2019-04-23 DIAGNOSIS — Z79899 Other long term (current) drug therapy: Secondary | ICD-10-CM | POA: Diagnosis not present

## 2019-04-23 DIAGNOSIS — E875 Hyperkalemia: Secondary | ICD-10-CM | POA: Diagnosis not present

## 2019-04-30 ENCOUNTER — Ambulatory Visit: Payer: PPO | Attending: Internal Medicine

## 2019-04-30 DIAGNOSIS — Z23 Encounter for immunization: Secondary | ICD-10-CM

## 2019-04-30 NOTE — Progress Notes (Signed)
   Covid-19 Vaccination Clinic  Name:  [name] [name]    MRN: [id number] DOB: 1940-08-14  04/30/2019  [name] was observed post Covid-19 immunization for 15 minutes without incident. He was provided with Vaccine Information Sheet and instruction to access the V-Safe system.   [name] was instructed to call 911 with any severe reactions post vaccine: Marland Kitchen Difficulty breathing  . Swelling of face and throat  . A fast heartbeat  . A bad rash all over body  . Dizziness and weakness   Immunizations Administered    Name Date Dose VIS Date Route   Moderna COVID-19 Vaccine 04/30/2019  8:45 AM 0.5 mL 12/2018 Intramuscular   Manufacturer: Moderna   Lot: [id number]   [place]: [id number]

## 2019-05-07 ENCOUNTER — Ambulatory Visit (INDEPENDENT_AMBULATORY_CARE_PROVIDER_SITE_OTHER): Payer: PPO

## 2019-05-07 DIAGNOSIS — E785 Hyperlipidemia, unspecified: Secondary | ICD-10-CM | POA: Diagnosis not present

## 2019-05-07 DIAGNOSIS — I43 Cardiomyopathy in diseases classified elsewhere: Secondary | ICD-10-CM | POA: Diagnosis not present

## 2019-05-07 DIAGNOSIS — Z1389 Encounter for screening for other disorder: Secondary | ICD-10-CM | POA: Diagnosis not present

## 2019-05-07 DIAGNOSIS — I482 Chronic atrial fibrillation, unspecified: Secondary | ICD-10-CM | POA: Diagnosis not present

## 2019-05-07 DIAGNOSIS — Z0000 Encounter for general adult medical examination without abnormal findings: Secondary | ICD-10-CM | POA: Diagnosis not present

## 2019-05-07 DIAGNOSIS — E212 Other hyperparathyroidism: Secondary | ICD-10-CM | POA: Diagnosis not present

## 2019-05-07 DIAGNOSIS — I4891 Unspecified atrial fibrillation: Secondary | ICD-10-CM | POA: Diagnosis not present

## 2019-05-07 DIAGNOSIS — C61 Malignant neoplasm of prostate: Secondary | ICD-10-CM

## 2019-05-07 DIAGNOSIS — N184 Chronic kidney disease, stage 4 (severe): Secondary | ICD-10-CM | POA: Diagnosis not present

## 2019-05-07 DIAGNOSIS — Z6838 Body mass index (BMI) 38.0-38.9, adult: Secondary | ICD-10-CM | POA: Diagnosis not present

## 2019-05-07 MED ORDER — DENOSUMAB 120 MG/1.7ML ~~LOC~~ SOLN
120.0000 mg | Freq: Once | SUBCUTANEOUS | Status: AC
  Administered 2019-05-07: 120 mg via SUBCUTANEOUS

## 2019-05-07 NOTE — Progress Notes (Signed)
Pt. Came in to for Xgeva Given to pt. In RLQ. Pt. Tolerated well. Has 1 month return appt [facility] for Xega.

## 2019-06-04 ENCOUNTER — Ambulatory Visit (INDEPENDENT_AMBULATORY_CARE_PROVIDER_SITE_OTHER): Payer: PPO

## 2019-06-04 ENCOUNTER — Ambulatory Visit: Payer: PPO | Admitting: Urology

## 2019-06-04 ENCOUNTER — Other Ambulatory Visit: Payer: Self-pay

## 2019-06-04 DIAGNOSIS — C61 Malignant neoplasm of prostate: Secondary | ICD-10-CM | POA: Diagnosis not present

## 2019-06-04 MED ORDER — DENOSUMAB 120 MG/1.7ML ~~LOC~~ SOLN
120.0000 mg | Freq: Once | SUBCUTANEOUS | Status: AC
  Administered 2019-06-04: 120 mg via SUBCUTANEOUS

## 2019-06-04 NOTE — Patient Instructions (Signed)
Patient Information Sheet : (Denosumab) Xgeva, Prolia)  What is this drug? Denosumab is a drug that maintains the strength of bones. It is FDA- approved treatment for patients with documented bone metastasis from solid tumors such as prostate cancer under the name of xgeva. It is also an FDA-approved treatment for men with prostate cancer at risk for complications related to bone loss under the name Prolia. Denosumab is not chemotherapy but is used with other treatments for advanced prostate cancer. It is administered by a subcutaneous injection.  Why am I taking this drug? You have been diagnosed with prostate cancer and are felt to be at an increased risk for bone loss related to your treatment or you have advanced prostate cancer that has spread to the bone increasing your risk for bone fractures. It is administered either every 6 months or every month depending on your risk factors for bone fracture.  How can this drug benefit me? Denosumab can reduce the risk of bone fractures and delay complications that occur when cancer has spread to the bones.  What should I do while taking Xgeva or Prolia? You need to have regular dental exams. You need to take calcium 1200mg each day as well as vitamin D 1000 IU per day. There are no restrictions on food, beverages or activity while receiving this medication.  What are the possible side effects of this medication? The most common side effects are fatigue, weakness, nausea and lowering of calcium levels. The most serious reaction os this drug is a serious allergic reaction which is rare. Other serious reactions that would require stopping the medication would be abnormally low blood calcium levels or osteonecrosis of the jaw.  Osteonecrosis of the jaw is a rare condition that involves the loss or breakdown of the jaw bow. Signs and symptoms include pain, swelling or infections of the gums, loosening of the tooth, poor healing of gums, numbness or a  feeling of heaviness in the jaw. You must notify your dentist and your urologist if you have any of these symptoms. Notify your urologist if you need any invasive dental treatments. Notify your dentist prior to starting this treatment. You must provide written clearance from your dentist prior to starting this drug and maintain regular dentist appointments throughout your treatment.   What is involved in receiving this drug? It is a subcutaneous injection (given under the skin) You will have a complete blood chemistry panel drawn prior to starting this drug and repeated every 6 months or as ordered by the doctor. This will check your calcium, magnesium and phosphorus levels.  

## 2019-06-24 ENCOUNTER — Other Ambulatory Visit: Payer: Self-pay | Admitting: "Endocrinology

## 2019-06-24 DIAGNOSIS — E039 Hypothyroidism, unspecified: Secondary | ICD-10-CM | POA: Diagnosis not present

## 2019-06-24 DIAGNOSIS — E212 Other hyperparathyroidism: Secondary | ICD-10-CM | POA: Diagnosis not present

## 2019-06-24 DIAGNOSIS — E559 Vitamin D deficiency, unspecified: Secondary | ICD-10-CM | POA: Diagnosis not present

## 2019-06-25 LAB — PTH, INTACT AND CALCIUM
Calcium: 8.9 mg/dL (ref 8.6–10.2)
PTH: 44 pg/mL (ref 15–65)

## 2019-06-25 LAB — T4, FREE: Free T4: 1.27 ng/dL (ref 0.82–1.77)

## 2019-06-25 LAB — VITAMIN D 25 HYDROXY (VIT D DEFICIENCY, FRACTURES): Vit D, 25-Hydroxy: 34.3 ng/mL (ref 30.0–100.0)

## 2019-06-25 LAB — TSH: TSH: 4.77 u[IU]/mL — ABNORMAL HIGH (ref 0.450–4.500)

## 2019-06-26 ENCOUNTER — Encounter: Payer: Self-pay | Admitting: "Endocrinology

## 2019-06-26 ENCOUNTER — Ambulatory Visit: Payer: PPO | Admitting: "Endocrinology

## 2019-06-26 ENCOUNTER — Other Ambulatory Visit: Payer: Self-pay

## 2019-06-26 VITALS — BP 153/96 | HR 54 | Ht 74.5 in | Wt 278.0 lb

## 2019-06-26 DIAGNOSIS — E559 Vitamin D deficiency, unspecified: Secondary | ICD-10-CM | POA: Diagnosis not present

## 2019-06-26 DIAGNOSIS — E212 Other hyperparathyroidism: Secondary | ICD-10-CM | POA: Diagnosis not present

## 2019-06-26 DIAGNOSIS — E039 Hypothyroidism, unspecified: Secondary | ICD-10-CM | POA: Diagnosis not present

## 2019-06-26 DIAGNOSIS — R7309 Other abnormal glucose: Secondary | ICD-10-CM | POA: Diagnosis not present

## 2019-06-26 LAB — POCT GLYCOSYLATED HEMOGLOBIN (HGB A1C): Hemoglobin A1C: 5.8 % — AB (ref 4.0–5.6)

## 2019-06-26 MED ORDER — LEVOTHYROXINE SODIUM 100 MCG PO TABS: 100.0000 ug | ORAL_TABLET | Freq: Every day | ORAL | 1 refills | Status: DC

## 2019-06-26 NOTE — Progress Notes (Signed)
06/26/2019      Endocrinology follow-up note   Subjective:    Patient ID: [name] [name], male    DOB: 06/20/1940, PCP [name] Sites, [name] [name]   Past Medical History:  Diagnosis Date  . Cardiomyopathy [facility])    h/o felt 2nd atrial fib, EF normal by echo 2013  . CHF (congestive heart failure) ([place])   . Chronic anticoagulation   . Chronic atrial fibrillation ([place])   . Chronic kidney disease (CKD), stage IV (severe) ([place])   . Dysrhythmia   . Hypothyroidism   . Pneumonia   . Pre-diabetes   . Primary localized osteoarthritis of left hip 05/02/2016  . Prostate cancer ([place])   . Shortness of breath   . Systemic hypertension    Past Surgical History:  Procedure Laterality Date  . CARDIOVERSION    . CATARACT EXTRACTION W/PHACO  04/17/2011   Procedure: CATARACT EXTRACTION PHACO AND INTRAOCULAR LENS PLACEMENT (IOC);  Surgeon: [name], [name] [name];  Location: [facility];  Service: Ophthalmology;  Laterality: Left;  CDE:14.58  . CATARACT EXTRACTION W/PHACO  05/22/2011   Procedure: CATARACT EXTRACTION PHACO AND INTRAOCULAR LENS PLACEMENT (IOC);  Surgeon: [name], [name] [name];  Location: [facility];  Service: Ophthalmology;  Laterality: Right;  CDE 13.02  . COLONOSCOPY  01/17/2011   Procedure: COLONOSCOPY;  Surgeon: Jamesetta So;  [facility];  Service: Gastroenterology;  Laterality: N/A;  . COLONOSCOPY N/A 02/24/2016   Procedure: COLONOSCOPY;  Surgeon: Rogene Houston, [name] [name];  [facility];  Service: Endoscopy;  Laterality: N/A;  12:50  . INGUINAL HERNIA REPAIR Right 09/27/2015   Procedure: RIGHT INGUINAL HERNIORRHAPY WITH MESH SMALL BOWEL RESECTION;  Surgeon: Aviva Signs, [name] [name];  Location: [facility];  Service: General;  Laterality: Right;  . NM MYOCAR [place]  02/2009   dipyridamole; mild perfusion defect due to infarct/scar with mild perinfarct ischemia is apical septal, apical basal inferoseptal, basal inferior, mid inferoseptal mid inferior & apical inferior; EKG negative for ischemia; low  risk scan  . PROSTATE SURGERY     7 years ago [facility] Dr Javaid-prostatectomy  . PROSTATECTOMY    . Sleep Study  05/2011   increased upper airway resistance syndrome with mild sleep apnea during REM  . TOTAL HIP ARTHROPLASTY Left 05/02/2016   Procedure: LEFT TOTAL HIP ARTHROPLASTY;  Surgeon: Marchia Bond, [name] [name];  Location: [place];  Service: Orthopedics;  Laterality: Left;  . TRANSTHORACIC ECHOCARDIOGRAM  04/2011   EF=50-55%; RV mildly dilated; LA mild-mod dilated; mild mitral valve annular calcif, mild MR; mod TR, RVSP elevated 30-50mmHg, mild pulm HTN; AV mildly sclerotic, mild calcif of AV leaflets; trace pulm valve regurg    Social History   Socioeconomic History  . Marital status: Single    Spouse name: Not on file  . Number of children: 2  . Years of education: Not on file  . Highest education level: Not on file  Occupational History  . Not on file  Tobacco Use  . Smoking status: Never Smoker  . Smokeless tobacco: Never Used  Vaping Use  . Vaping Use: Never used  Substance and Sexual Activity  . Alcohol use: No  . Drug use: No  . Sexual activity: Yes    Birth control/protection: None  Other Topics Concern  . Not on file  Social History Narrative  . Not on file   Social Determinants of Health   Financial Resource Strain:   . Difficulty of Paying Living Expenses:   Food Insecurity:   .  Worried About Charity fundraiser in the Last Year:   . Arboriculturist in the Last Year:   Transportation Needs:   . Film/video editor (Medical):   Marland Kitchen Lack of Transportation (Non-Medical):   Physical Activity:   . Days of Exercise per Week:   . Minutes of Exercise per Session:   Stress:   . Feeling of Stress :   Social Connections:   . Frequency of Communication with Friends and Family:   . Frequency of Social Gatherings with Friends and Family:   . Attends Religious Services:   . Active Member of Clubs or Organizations:   . Attends Archivist Meetings:   Marland Kitchen Marital  Status:    Outpatient Encounter Medications as of 06/26/2019  Medication Sig  . Calcium Carb-Cholecalciferol (CALCIUM 600+D3) 600-800 MG-UNIT TABS Take 1 tablet by mouth 2 (two) times daily.  . ferrous sulfate (QC FERROUS SULFATE) 325 (65 FE) MG tablet Take 325 mg by mouth daily with breakfast.  . levothyroxine (SYNTHROID) 100 MCG tablet Take 1 tablet (100 mcg total) by mouth daily before breakfast.  . metoprolol tartrate (LOPRESSOR) 50 MG tablet Take 1 tablet by mouth twice daily  . Rivaroxaban (XARELTO) 15 MG TABS tablet TAKE 1 TABLET BY MOUTH ONCE DAILY WITH SUPPER  . sodium bicarbonate 650 MG tablet Take 650 mg by mouth 2 (two) times daily.  . [DISCONTINUED] Cholecalciferol 25 MCG (1000 UT) tablet Take by mouth.  . [DISCONTINUED] levothyroxine (SYNTHROID) 88 MCG tablet Take 1 tablet (88 mcg total) by mouth daily before breakfast.   Facility-Administered Encounter Medications as of 06/26/2019  Medication  . neomycin-polymyxin-dexameth (MAXITROL) 0.1 % ophth ointment   ALLERGIES: Allergies  Allergen Reactions  . No Known Allergies    VACCINATION STATUS: Immunization History  Administered Date(s) Administered  . Moderna SARS-COVID-2 Vaccination 03/29/2019, 04/30/2019    HPI  79 yr old male with above medical history. He is being engaged in telehealth via telephone for hypothyroidism, hypocalcemia, vitamin D deficiency with history of tertiary hyperparathyroidism.   He was found to have primary hypothyroidism  approximate at age of 36 years and was initiated on levothyroxine currently at 88 mcg p.o. every morning.   -He is accompanied by his son.  Patient is compliant, no new complaints today.    he denies cold/heat intolerance.  -He denies history of goiter, nor family history of thyroid cancer.  -  he survives prostate cancer. -he has history of tertiary hyperparathyroidism with PTH as high as 76 which is currently improving to 45 with supplement of calcium and vitamin D. His  most recent calcium is corrected at 8.9, with corresponding PTH of 44.  his prior 24 hour urine calcium has been  low at 57.   Review of Systems  Limited as above.  Objective:    BP (!) 153/96   Pulse (!) 54   Ht 6' 2.5" (1.892 m)   Wt 278 lb (126.1 kg)   BMI 35.22 kg/m   Wt Readings from Last 3 Encounters:  06/26/19 278 lb (126.1 kg)  12/30/18 273 lb 6.4 oz (124 kg)  12/25/17 268 lb (121.6 kg)    Physical Exam    Physical Exam- Limited  Constitutional:  Body mass index is 35.22 kg/m. , not in acute distress, normal state of mind Eyes:  EOMI, no exophthalmos Neck: Supple Thyroid: No gross goiter Respiratory: Adequate breathing efforts Musculoskeletal:  + Walks with a cane due to disequilibrium, strength intact in all four  extremities, no gross restriction of joint movements Skin:  no rashes, no hyperemia Neurological: no tremor with outstretched hands,    Results for orders placed or performed in visit on 06/26/19  HgB A1c  Result Value Ref Range   Hemoglobin A1C 5.8 (A) 4.0 - 5.6 %   HbA1c POC (<> result, manual entry)     HbA1c, POC (prediabetic range)     HbA1c, POC (controlled diabetic range)      Lipid Panel     Component Value Date/Time   CHOL  02/04/2009 0538    142        ATP III CLASSIFICATION:  <200     mg/dL   Desirable  200-239  mg/dL   Borderline High  >=240    mg/dL   High          TRIG 122 02/04/2009 0538   HDL 33 (L) 02/04/2009 0538   CHOLHDL 4.3 02/04/2009 0538   VLDL 24 02/04/2009 0538   LDLCALC  02/04/2009 0538    85        Total Cholesterol/HDL:CHD Risk Coronary Heart Disease Risk Table                     Men   Women  1/2 Average Risk   3.4   3.3  Average Risk       5.0   4.4  2 X Average Risk   9.6   7.1  3 X Average Risk  23.4   11.0        Use the calculated Patient Ratio above and the CHD Risk Table to determine the patient's CHD Risk.        ATP III CLASSIFICATION (LDL):  <100     mg/dL   Optimal  100-129  mg/dL    Near or Above                    Optimal  130-159  mg/dL   Borderline  160-189  mg/dL   High  >190     mg/dL   Very High   10/24/2019 labs: Calcium 8.9, PTH 44 TSH 4.77, free T4 1.27 Vitamin D 34.3   Assessment & Plan:   1.  Hypothyroidism -His previsit thyroid function tests are consistent with inadequate replacement.  I discussed and increase his levothyroxine to 100 mcg p.o. daily before breakfast.     - We discussed about the correct intake of his thyroid hormone, on empty stomach at fasting, with water, separated by at least 30 minutes from breakfast and other medications,  and separated by more than 4 hours from calcium, iron, multivitamins, acid reflux medications (PPIs). -Patient is made aware of the fact that thyroid hormone replacement is needed for life, dose to be adjusted by periodic monitoring of thyroid function tests.    2. Tertiary hyperparathyroidism    His last PTH is stable at 44, associated with normal calcium of 8.9.    -He is now vitamin D replete at 34.  He is advised to maintain low-dose over-the-counter vitamin D/calcium supplement.  He does not need high-dose prescription strength vitamin D. His prior 24 hour urine calcium has been  low at 57  3. Hypertension:  I advised him to continue losartan and carvedilol.  4.  Obesity with prediabetes At risk for diabetes, his point-of-care A1c today is 5.8%.  He does not need medication intervention at this time.  - he  admits there is a room for  improvement in his diet and drink choices. -  Suggestion is made for him to avoid simple carbohydrates  from his diet including Cakes, Sweet Desserts / Pastries, Ice Cream, Soda (diet and regular), Sweet Tea, Candies, Chips, Cookies, Sweet Pastries,  Store Bought Juices, Alcohol in Excess of  1-2 drinks a day, Artificial Sweeteners, Coffee Creamer, and "Sugar-free" Products. This will help patient to have stable blood glucose profile and potentially avoid unintended weight  gain.    - I advised patient to maintain close follow up with [name] Sites, [name] [name] for primary care needs.     - Time spent on this patient care encounter:  20 minutes of which 50% was spent in  counseling and the rest reviewing  his current and  previous labs / studies and medications  doses and developing a plan for long term care. Lendon Ka Merkin  participated in the discussions, expressed understanding, and voiced agreement with the above plans.  All questions were answered to his satisfaction. he is encouraged to contact clinic should he have any questions or concerns prior to his return visit.   Follow up plan: Return in about 6 months (around 12/26/2019) for F/U with Pre-visit Labs, NV A1c in Office.  Glade Lloyd, [name] [name] Phone: [phone]  Fax: [phone]   -  This note was partially dictated with voice recognition software. Similar sounding words can be transcribed inadequately or may not  be corrected upon review.  06/26/2019, 12:54 PM

## 2019-07-01 ENCOUNTER — Other Ambulatory Visit: Payer: Self-pay

## 2019-07-01 ENCOUNTER — Ambulatory Visit (INDEPENDENT_AMBULATORY_CARE_PROVIDER_SITE_OTHER): Payer: PPO

## 2019-07-01 DIAGNOSIS — C61 Malignant neoplasm of prostate: Secondary | ICD-10-CM | POA: Diagnosis not present

## 2019-07-01 MED ORDER — DENOSUMAB 120 MG/1.7ML ~~LOC~~ SOLN
120.0000 mg | Freq: Once | SUBCUTANEOUS | Status: AC
  Administered 2019-07-01: 120 mg via SUBCUTANEOUS

## 2019-07-01 NOTE — Patient Instructions (Signed)
Patient Information Sheet : (Denosumab) Xgeva, Prolia)  What is this drug? Denosumab is a drug that maintains the strength of bones. It is FDA- approved treatment for patients with documented bone metastasis from solid tumors such as prostate cancer under the name of xgeva. It is also an FDA-approved treatment for men with prostate cancer at risk for complications related to bone loss under the name Prolia. Denosumab is not chemotherapy but is used with other treatments for advanced prostate cancer. It is administered by a subcutaneous injection.  Why am I taking this drug? You have been diagnosed with prostate cancer and are felt to be at an increased risk for bone loss related to your treatment or you have advanced prostate cancer that has spread to the bone increasing your risk for bone fractures. It is administered either every 6 months or every month depending on your risk factors for bone fracture.  How can this drug benefit me? Denosumab can reduce the risk of bone fractures and delay complications that occur when cancer has spread to the bones.  What should I do while taking Xgeva or Prolia? You need to have regular dental exams. You need to take calcium 1200mg each day as well as vitamin D 1000 IU per day. There are no restrictions on food, beverages or activity while receiving this medication.  What are the possible side effects of this medication? The most common side effects are fatigue, weakness, nausea and lowering of calcium levels. The most serious reaction os this drug is a serious allergic reaction which is rare. Other serious reactions that would require stopping the medication would be abnormally low blood calcium levels or osteonecrosis of the jaw.  Osteonecrosis of the jaw is a rare condition that involves the loss or breakdown of the jaw bow. Signs and symptoms include pain, swelling or infections of the gums, loosening of the tooth, poor healing of gums, numbness or a  feeling of heaviness in the jaw. You must notify your dentist and your urologist if you have any of these symptoms. Notify your urologist if you need any invasive dental treatments. Notify your dentist prior to starting this treatment. You must provide written clearance from your dentist prior to starting this drug and maintain regular dentist appointments throughout your treatment.   What is involved in receiving this drug? It is a subcutaneous injection (given under the skin) You will have a complete blood chemistry panel drawn prior to starting this drug and repeated every 6 months or as ordered by the doctor. This will check your calcium, magnesium and phosphorus levels.  

## 2019-07-15 DIAGNOSIS — E875 Hyperkalemia: Secondary | ICD-10-CM | POA: Diagnosis not present

## 2019-07-15 DIAGNOSIS — E559 Vitamin D deficiency, unspecified: Secondary | ICD-10-CM | POA: Diagnosis not present

## 2019-07-15 DIAGNOSIS — I129 Hypertensive chronic kidney disease with stage 1 through stage 4 chronic kidney disease, or unspecified chronic kidney disease: Secondary | ICD-10-CM | POA: Diagnosis not present

## 2019-07-15 DIAGNOSIS — R809 Proteinuria, unspecified: Secondary | ICD-10-CM | POA: Diagnosis not present

## 2019-07-15 DIAGNOSIS — Z79899 Other long term (current) drug therapy: Secondary | ICD-10-CM | POA: Diagnosis not present

## 2019-07-15 DIAGNOSIS — E211 Secondary hyperparathyroidism, not elsewhere classified: Secondary | ICD-10-CM | POA: Diagnosis not present

## 2019-07-19 ENCOUNTER — Other Ambulatory Visit: Payer: Self-pay | Admitting: Cardiovascular Disease

## 2019-07-21 NOTE — Telephone Encounter (Signed)
96m 126/1kg Scr 1.86 (12/16/18) ccr 57.4 Lovw/croitoru 12/30/18 Pt requesting xarelto 15mg  when they qualify for the 20mg  based on dosing guidelines. Will route to the pharmd pool

## 2019-07-22 ENCOUNTER — Ambulatory Visit: Payer: PPO | Admitting: Urology

## 2019-07-22 NOTE — Telephone Encounter (Signed)
Most recent SCr was 4/21 in [place].  Calculated to CrCl 55 ml/min When using IBW (pt 74.5" and weighs 126.1 kg) CrCl 36.2 ml/min  Based on fact that SCr has fluctuated from 1.86 to 2.45 over past several years, would recommend leaving him on the 15 mg dose

## 2019-07-23 DIAGNOSIS — I129 Hypertensive chronic kidney disease with stage 1 through stage 4 chronic kidney disease, or unspecified chronic kidney disease: Secondary | ICD-10-CM | POA: Diagnosis not present

## 2019-07-23 DIAGNOSIS — N184 Chronic kidney disease, stage 4 (severe): Secondary | ICD-10-CM | POA: Diagnosis not present

## 2019-07-23 DIAGNOSIS — R809 Proteinuria, unspecified: Secondary | ICD-10-CM | POA: Diagnosis not present

## 2019-07-23 DIAGNOSIS — E211 Secondary hyperparathyroidism, not elsewhere classified: Secondary | ICD-10-CM | POA: Diagnosis not present

## 2019-07-29 ENCOUNTER — Ambulatory Visit: Payer: PPO | Admitting: Urology

## 2019-08-12 ENCOUNTER — Ambulatory Visit (INDEPENDENT_AMBULATORY_CARE_PROVIDER_SITE_OTHER): Payer: PPO | Admitting: Urology

## 2019-08-12 ENCOUNTER — Other Ambulatory Visit: Payer: Self-pay

## 2019-08-12 ENCOUNTER — Encounter: Payer: Self-pay | Admitting: Urology

## 2019-08-12 VITALS — BP 156/98 | HR 86 | Temp 98.1°F | Ht 74.5 in | Wt 272.0 lb

## 2019-08-12 DIAGNOSIS — C61 Malignant neoplasm of prostate: Secondary | ICD-10-CM

## 2019-08-12 LAB — URINALYSIS, ROUTINE W REFLEX MICROSCOPIC
Bilirubin, UA: NEGATIVE
Glucose, UA: NEGATIVE
Ketones, UA: NEGATIVE
Nitrite, UA: NEGATIVE
Specific Gravity, UA: 1.01 (ref 1.005–1.030)
Urobilinogen, Ur: 0.2 mg/dL (ref 0.2–1.0)
pH, UA: 5 (ref 5.0–7.5)

## 2019-08-12 LAB — MICROSCOPIC EXAMINATION
Renal Epithel, UA: NONE SEEN /hpf
WBC, UA: >30 /hpf — AB (ref 0–5)

## 2019-08-12 MED ORDER — DENOSUMAB 120 MG/1.7ML ~~LOC~~ SOLN
120.0000 mg | Freq: Once | SUBCUTANEOUS | Status: AC
  Administered 2019-08-12: 120 mg via SUBCUTANEOUS

## 2019-08-12 NOTE — Progress Notes (Signed)
Urological Symptom Review  Patient is experiencing the following symptoms: Frequent urination Get up at night to urinate Leakage of urine  Weak stream Review of Systems  Gastrointestinal (upper)  : Negative for upper GI symptoms  Gastrointestinal (lower) : Negative for lower GI symptoms  Constitutional : Night Sweats  Skin: Negative for skin symptoms  Eyes: Negative for eye symptoms  Ear/Nose/Throat : Negative for Ear/Nose/Throat symptoms  Hematologic/Lymphatic: Negative for Hematologic/Lymphatic symptoms  Cardiovascular : Negative for cardiovascular symptoms  Respiratory : Shortness of breath  Endocrine: Negative for endocrine symptoms  Musculoskeletal: Back pain Joint pain  Neurological: Negative for neurological symptoms  Psychologic: Negative for psychiatric symptoms

## 2019-08-12 NOTE — Addendum Note (Signed)
Addended by: Franchot Gallo on: 08/12/2019 09:44 AM   Modules accepted: Orders

## 2019-08-12 NOTE — Progress Notes (Signed)
H&P  Chief Complaint: Prostate Cancer  History of Present Illness:   8.3.2020: Pt denies any LUTS and hematuria or loss of appetite or weight. Pt submits no questions or concerns at this time regarding his health. Pt has multiple questions about billing for his ADT.  (below copied from [facility] records):  3.16.2021: Here today for 4 mo lupron and 1 mo xgeva. No recent labs. He reports that, since last seen for OV, he has been doing quite well with no significant urinary complaints. He is having occasional hot flashes but these do not bother him. He denies any changes in weight, appetite, or energy level. No new aches or pains.   Prostate Cancer:  Toron Bowring is a 79 year-old male established patient who is here for prostate cancer which has been treated.  He did receive radiation therapy for his cancer. He was treated with xrt for his cancer. Patient denies brachytherapy and high dose radiation. His radiation treatment was complete approximately 08/09/2000.   Initial diagnosis 2002--PSA 12, apparently Gleason score 5 (2+3). 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  Underwent EBRT--35 treatments.   CT abdomen and pelvis on 11.20.2017, performed for gross hematuria. This revealed an absent prostate, no evidence for recurrent tumor or metastatic disease within the abdomen or pelvis. Bilateral renal calculi noted. Sclerotic lesion involving L4 vertebra favored to represent a bone island.   2.12.2019: Most recent PSA is up to 5.7. He denies bony pain. He denies significant weight loss.  4.8.2019: Bone scan--abnml uptake in L4 vertebral bodyc/w met PCa  5.7.2019: CT (03/12/2017, 05/07/2017) revealed larger L4 sclerotic met w/ several new foci--L3, T12, left 8th rib, right ilium. No complaints of bony pain.   5.7.2019: He was started on ADT with Firmagon   6.25.2019: first Lupron injection.   10.29.2019: Receiving Xgeva on a  monthly basis. He has not had recent laboratories drawn. He is having no blood in his urine or stool. He states that he has a good urinary stream. He has had some recent back pain, but he spent about an hour and half on a lawnmower recently. He is having hot flashes. Most recent PSA from June of this year, following initiation of androgen deprivation therapy was down to 0.2.   3.3.2020: Most recent PSA from October 27, 2017 was less than 0.1. He apparently had labs drawn last week, but the only lab returned was a basic metabolic panel. He has had hot flashes. He denies blood in his urine or stool. He does have urgency and occasional urgency incontinence. No dysuria. He has a good stream. He has limited mobility.   11.3.2020: PSA 6.2020--<0.1. T level castrate. Here today for lupron. He has been having hot flashes but states that these are tolerable given the weather has gotten a lot colder. He is due a repeat PSA. He denies any dysuria or hematuria. He denies any new urinary complaints. He states that his boney pain has since resolved.   Past Medical History:  Diagnosis Date  . Cardiomyopathy [facility])    h/o felt 2nd atrial fib, EF normal by echo 2013  . CHF (congestive heart failure) ([place])   . Chronic anticoagulation   . Chronic atrial fibrillation ([place])   . Chronic kidney disease (CKD), stage IV (severe) ([place])   . Dysrhythmia   . Hypothyroidism   . Pneumonia   . Pre-diabetes   . Primary localized osteoarthritis of left hip 05/02/2016  . Prostate cancer (  [facility])   . Shortness of breath   . Systemic hypertension     Past Surgical History:  Procedure Laterality Date  . CARDIOVERSION    . CATARACT EXTRACTION W/PHACO  04/17/2011   Procedure: CATARACT EXTRACTION PHACO AND INTRAOCULAR LENS PLACEMENT (IOC);  Surgeon: [name], MD;  [facility];  Service: Ophthalmology;  Laterality: Left;  CDE:14.58  . CATARACT EXTRACTION W/PHACO  05/22/2011   Procedure: CATARACT EXTRACTION PHACO AND  INTRAOCULAR LENS PLACEMENT (IOC);  Surgeon: [name], MD;  [facility];  Service: Ophthalmology;  Laterality: Right;  CDE 13.02  . COLONOSCOPY  01/17/2011   Procedure: COLONOSCOPY;  Surgeon: Jamesetta So;  [facility];  Service: Gastroenterology;  Laterality: N/A;  . COLONOSCOPY N/A 02/24/2016   Procedure: COLONOSCOPY;  Surgeon: Rogene Houston, MD;  [facility];  Service: Endoscopy;  Laterality: N/A;  12:50  . INGUINAL HERNIA REPAIR Right 09/27/2015   Procedure: RIGHT INGUINAL HERNIORRHAPY WITH MESH SMALL BOWEL RESECTION;  Surgeon: Aviva Signs, MD;  Location: [facility];  Service: General;  Laterality: Right;  . NM MYOCAR [place]  02/2009   dipyridamole; mild perfusion defect due to infarct/scar with mild perinfarct ischemia is apical septal, apical basal inferoseptal, basal inferior, mid inferoseptal mid inferior & apical inferior; EKG negative for ischemia; low risk scan  . PROSTATE SURGERY     7 years ago [facility] Dr Javaid-prostatectomy  . PROSTATECTOMY    . Sleep Study  05/2011   increased upper airway resistance syndrome with mild sleep apnea during REM  . TOTAL HIP ARTHROPLASTY Left 05/02/2016   Procedure: LEFT TOTAL HIP ARTHROPLASTY;  Surgeon: Marchia Bond, MD;  Location: [place];  Service: Orthopedics;  Laterality: Left;  . TRANSTHORACIC ECHOCARDIOGRAM  04/2011   EF=50-55%; RV mildly dilated; LA mild-mod dilated; mild mitral valve annular calcif, mild MR; mod TR, RVSP elevated 30-31mHg, mild pulm HTN; AV mildly sclerotic, mild calcif of AV leaflets; trace pulm valve regurg     Home Medications:  Allergies as of 08/12/2019      Reactions   No Known Allergies       Medication List       Accurate as of August 12, 2019  9:04 AM. If you have any questions, ask your nurse or doctor.        Calcium 600+D3 600-800 MG-UNIT Tabs Generic drug: Calcium Carb-Cholecalciferol Take 1 tablet by mouth 2 (two) times daily.   levothyroxine 100 MCG tablet Commonly  known as: SYNTHROID Take 1 tablet (100 mcg total) by mouth daily before breakfast.   metoprolol tartrate 50 MG tablet Commonly known as: LOPRESSOR Take 1 tablet by mouth twice daily   QC Ferrous Sulfate 325 (65 FE) MG tablet Generic drug: ferrous sulfate Take 325 mg by mouth daily with breakfast.   sodium bicarbonate 650 MG tablet Take 650 mg by mouth 2 (two) times daily.   Xarelto 15 MG Tabs tablet Generic drug: Rivaroxaban TAKE 1 TABLET BY MOUTH ONCE DAILY WITH SUPPER       Allergies:  Allergies  Allergen Reactions  . No Known Allergies     Family History  Problem Relation Age of Onset  . Diabetes Mother   . Prostate cancer Father   . Anesthesia problems Neg Hx   . Hypotension Neg Hx   . Malignant hyperthermia Neg Hx   . Pseudochol deficiency Neg Hx     Social History:  reports that he has never smoked. He has never used smokeless  tobacco. He reports that he does not drink alcohol and does not use drugs.  ROS: A complete review of systems was performed.  All systems are negative except for pertinent findings as noted.  Physical Exam:  Vital signs in last 24 hours: There were no vitals taken for this visit. Constitutional:  Alert and oriented, No acute distress Cardiovascular: Regular rate  Respiratory: Normal respiratory effort Lymphatic: No lymphadenopathy Neurologic: Grossly intact, no focal deficits Psychiatric: Normal mood and affect  I have reviewed prior pt notes  I have reviewed notes from referring/previous physicians  I have reviewed urinalysis results  I have independently reviewed prior imaging  I have reviewed prior PSA results Impression/Assessment:  Castrate sensitive prostate cancer, on long-term ADT.  He does have bony disease and is also on Xgeva.  He has questions/complaints about billing regarding his ADT  Plan:  1. Pt advised regarding surgical castration.  I did let him know that this may well save him a significant amount of  money if he will need to continue on long-term ADT  2. F/U in one month to revisit patient care plan.  3.  He will receive his Delton See today  CC: Dr. Brigitte Pulse

## 2019-08-13 LAB — MICROSCOPIC EXAMINATION
Casts: NONE SEEN /lpf
WBC, UA: >30 /hpf — AB (ref 0–5)

## 2019-08-13 LAB — URINALYSIS, ROUTINE W REFLEX MICROSCOPIC
Bilirubin, UA: NEGATIVE
Glucose, UA: NEGATIVE
Ketones, UA: NEGATIVE
Nitrite, UA: NEGATIVE
Specific Gravity, UA: 1.016 (ref 1.005–1.030)
Urobilinogen, Ur: 0.2 mg/dL (ref 0.2–1.0)
pH, UA: 6.5 (ref 5.0–7.5)

## 2019-08-13 LAB — PSA: Prostate Specific Ag, Serum: 0.3 ng/mL (ref 0.0–4.0)

## 2019-08-13 LAB — TESTOSTERONE: Testosterone: 10 ng/dL — ABNORMAL LOW (ref 264–916)

## 2019-09-16 ENCOUNTER — Other Ambulatory Visit: Payer: Self-pay

## 2019-09-16 ENCOUNTER — Encounter: Payer: Self-pay | Admitting: Urology

## 2019-09-16 ENCOUNTER — Ambulatory Visit (INDEPENDENT_AMBULATORY_CARE_PROVIDER_SITE_OTHER): Payer: PPO | Admitting: Urology

## 2019-09-16 VITALS — BP 152/76 | HR 61 | Temp 98.0°F | Ht 72.5 in | Wt 272.0 lb

## 2019-09-16 DIAGNOSIS — C61 Malignant neoplasm of prostate: Secondary | ICD-10-CM | POA: Diagnosis not present

## 2019-09-16 DIAGNOSIS — C7951 Secondary malignant neoplasm of bone: Secondary | ICD-10-CM | POA: Diagnosis not present

## 2019-09-16 MED ORDER — LEUPROLIDE ACETATE (4 MONTH) 30 MG ~~LOC~~ KIT
30.0000 mg | PACK | Freq: Once | SUBCUTANEOUS | Status: AC
  Administered 2019-09-16: 30 mg via SUBCUTANEOUS

## 2019-09-16 MED ORDER — DENOSUMAB 120 MG/1.7ML ~~LOC~~ SOLN
120.0000 mg | Freq: Once | SUBCUTANEOUS | Status: AC
  Administered 2019-09-16: 120 mg via SUBCUTANEOUS

## 2019-09-16 NOTE — Progress Notes (Signed)

## 2019-09-16 NOTE — Progress Notes (Signed)
H&P  Chief Complaint: Prostate Cancer  History of Present Illness:  9.7.2021: Pt states that he would like to continue Lupron instead of proceeding with surgical castration. Pt reports that he is feeling well and has no new aches or pains. He brings in billing info w/ him.  (below copied from [facility] records):  3.16.2021:Here today for 4 mo lupron and 1 mo xgeva. No recent labs. He reports that, since last seen for OV, he has been doing quite well with no significant urinary complaints. He is having occasional hot flashes but these do not bother him. He denies any changes in weight, appetite, or energy level. No new aches or pains.  Prostate Cancer:  [name] [name] is a 79 year-old male established patient who is here for prostate cancer which has been treated.  He did receive radiation therapy for his cancer. He was treated with xrt for his cancer. Patient denies brachytherapy and high dose radiation. His radiation treatment was complete approximately 08/09/2000.   Initial diagnosis 2002--PSA 12, apparently Gleason score 5 (2+3). 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  Underwent EBRT--35 treatments.   CT abdomen and pelvis on 11.20.2017, performed for gross hematuria. This revealed an absent prostate, no evidence for recurrent tumor or metastatic disease within the abdomen or pelvis. Bilateral renal calculi noted. Sclerotic lesion involving L4 vertebra favored to represent a bone island.   2.12.2019: Most recent PSA is up to 5.7. He denies bony pain. He denies significant weight loss.  4.8.2019: Bone scan--abnml uptake in L4 vertebral bodyc/w met PCa  5.7.2019: CT (03/12/2017, 05/07/2017) revealed larger L4 sclerotic met w/ several new foci--L3, T12, left 8th rib, right ilium. No complaints of bony pain.   5.7.2019: He was started on ADT with Firmagon   6.25.2019: first Lupron injection.   10.29.2019: Receiving Xgeva on  a monthly basis. He has not had recent laboratories drawn. He is having no blood in his urine or stool. He states that he has a good urinary stream. He has had some recent back pain, but he spent about an hour and half on a lawnmower recently. He is having hot flashes. Most recent PSA from June of this year, following initiation of androgen deprivation therapy was down to 0.2.   3.3.2020: Most recent PSA from October 27, 2017 was less than 0.1. He apparently had labs drawn last week, but the only lab returned was a basic metabolic panel. He has had hot flashes. He denies blood in his urine or stool. He does have urgency and occasional urgency incontinence. No dysuria. He has a good stream. He has limited mobility.   11.3.2020: PSA 6.2020--<0.1. T level castrate. Here today for lupron. He has been having hot flashes but states that these are tolerable given the weather has gotten a lot colder. He is due a repeat PSA. He denies any dysuria or hematuria. He denies any new urinary complaints. He states that his boney pain has since resolved.  8.3.2020: Pt denies any LUTS and hematuria or loss of appetite or weight. Pt submits no questions or concerns at this time regarding his health. Pt has multiple questions about billing for his ADT.  Past Medical History:  Diagnosis Date  . Cardiomyopathy [facility])    h/o felt 2nd atrial fib, EF normal by echo 2013  . CHF (congestive heart failure) ([place])   . Chronic anticoagulation   . Chronic atrial fibrillation ([place])   . Chronic kidney disease (CKD),  stage IV (severe) ([place])   . Dysrhythmia   . Hypothyroidism   . Pneumonia   . Pre-diabetes   . Primary localized osteoarthritis of left hip 05/02/2016  . Prostate cancer ([place])   . Shortness of breath   . Systemic hypertension     Past Surgical History:  Procedure Laterality Date  . CARDIOVERSION    . CATARACT EXTRACTION W/PHACO  04/17/2011   Procedure: CATARACT EXTRACTION PHACO AND INTRAOCULAR LENS PLACEMENT  (IOC);  Surgeon: [name], MD;  Location: [facility];  Service: Ophthalmology;  Laterality: Left;  CDE:14.58  . CATARACT EXTRACTION W/PHACO  05/22/2011   Procedure: CATARACT EXTRACTION PHACO AND INTRAOCULAR LENS PLACEMENT (IOC);  Surgeon: [name], MD;  Location: [facility];  Service: Ophthalmology;  Laterality: Right;  CDE 13.02  . COLONOSCOPY  01/17/2011   Procedure: COLONOSCOPY;  Surgeon: Jamesetta So;  [facility];  Service: Gastroenterology;  Laterality: N/A;  . COLONOSCOPY N/A 02/24/2016   Procedure: COLONOSCOPY;  Surgeon: Rogene Houston, MD;  [facility];  Service: Endoscopy;  Laterality: N/A;  12:50  . INGUINAL HERNIA REPAIR Right 09/27/2015   Procedure: RIGHT INGUINAL HERNIORRHAPY WITH MESH SMALL BOWEL RESECTION;  Surgeon: Aviva Signs, MD;  Location: [facility];  Service: General;  Laterality: Right;  . NM MYOCAR [place]  02/2009   dipyridamole; mild perfusion defect due to infarct/scar with mild perinfarct ischemia is apical septal, apical basal inferoseptal, basal inferior, mid inferoseptal mid inferior & apical inferior; EKG negative for ischemia; low risk scan  . PROSTATE SURGERY     7 years ago [facility] Dr Javaid-prostatectomy  . PROSTATECTOMY    . Sleep Study  05/2011   increased upper airway resistance syndrome with mild sleep apnea during REM  . TOTAL HIP ARTHROPLASTY Left 05/02/2016   Procedure: LEFT TOTAL HIP ARTHROPLASTY;  Surgeon: Marchia Bond, MD;  Location: [place];  Service: Orthopedics;  Laterality: Left;  . TRANSTHORACIC ECHOCARDIOGRAM  04/2011   EF=50-55%; RV mildly dilated; LA mild-mod dilated; mild mitral valve annular calcif, mild MR; mod TR, RVSP elevated 30-75mHg, mild pulm HTN; AV mildly sclerotic, mild calcif of AV leaflets; trace pulm valve regurg     Home Medications:  Allergies as of 09/16/2019      Reactions   No Known Allergies       Medication List       Accurate as of September 16, 2019  9:15 AM. If you have any questions, ask your  nurse or doctor.        Calcium 600+D3 600-800 MG-UNIT Tabs Generic drug: Calcium Carb-Cholecalciferol Take 1 tablet by mouth 2 (two) times daily.   levothyroxine 100 MCG tablet Commonly known as: SYNTHROID Take 1 tablet (100 mcg total) by mouth daily before breakfast.   metoprolol tartrate 50 MG tablet Commonly known as: LOPRESSOR Take 1 tablet by mouth twice daily   QC Ferrous Sulfate 325 (65 FE) MG tablet Generic drug: ferrous sulfate Take 325 mg by mouth daily with breakfast.   sodium bicarbonate 650 MG tablet Take 650 mg by mouth 2 (two) times daily.   Xarelto 15 MG Tabs tablet Generic drug: Rivaroxaban TAKE 1 TABLET BY MOUTH ONCE DAILY WITH SUPPER       Allergies:  Allergies  Allergen Reactions  . No Known Allergies     Family History  Problem Relation Age of Onset  . Diabetes Mother   . Prostate cancer Father   . Anesthesia problems Neg Hx   . Hypotension  Neg Hx   . Malignant hyperthermia Neg Hx   . Pseudochol deficiency Neg Hx     Social History:  reports that he has never smoked. He has never used smokeless tobacco. He reports that he does not drink alcohol and does not use drugs.  ROS: A complete review of systems was performed.  All systems are negative except for pertinent findings as noted.  Physical Exam:  Vital signs in last 24 hours: There were no vitals taken for this visit. 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 Pt continued on ADT and Xgeva  2. F/U in 4 months to continue therapy.  3. Questions answered regarding  treatments  CC: Dr. [name]

## 2019-10-20 DIAGNOSIS — E211 Secondary hyperparathyroidism, not elsewhere classified: Secondary | ICD-10-CM | POA: Diagnosis not present

## 2019-10-20 DIAGNOSIS — R809 Proteinuria, unspecified: Secondary | ICD-10-CM | POA: Diagnosis not present

## 2019-10-20 DIAGNOSIS — I129 Hypertensive chronic kidney disease with stage 1 through stage 4 chronic kidney disease, or unspecified chronic kidney disease: Secondary | ICD-10-CM | POA: Diagnosis not present

## 2019-10-20 DIAGNOSIS — N184 Chronic kidney disease, stage 4 (severe): Secondary | ICD-10-CM | POA: Diagnosis not present

## 2019-10-22 ENCOUNTER — Ambulatory Visit (INDEPENDENT_AMBULATORY_CARE_PROVIDER_SITE_OTHER): Payer: PPO

## 2019-10-22 ENCOUNTER — Other Ambulatory Visit: Payer: Self-pay

## 2019-10-22 DIAGNOSIS — C61 Malignant neoplasm of prostate: Secondary | ICD-10-CM | POA: Diagnosis not present

## 2019-10-22 MED ORDER — DENOSUMAB 120 MG/1.7ML ~~LOC~~ SOLN
120.0000 mg | Freq: Once | SUBCUTANEOUS | Status: AC
  Administered 2019-10-22: 120 mg via SUBCUTANEOUS

## 2019-10-22 NOTE — Progress Notes (Signed)
Pt is here today for xgeva tx given in left upper abdomen. Pt. Tolerated procedure well. Pt will return for 1 month [place] xgeva injection.

## 2019-10-22 NOTE — Patient Instructions (Signed)
Patient Information Sheet : (Denosumab) Xgeva, Prolia)  What is this drug? Denosumab is a drug that maintains the strength of bones. It is FDA- approved treatment for patients with documented bone metastasis from solid tumors such as prostate cancer under the name of xgeva. It is also an FDA-approved treatment for men with prostate cancer at risk for complications related to bone loss under the name Prolia. Denosumab is not chemotherapy but is used with other treatments for advanced prostate cancer. It is administered by a subcutaneous injection.  Why am I taking this drug? You have been diagnosed with prostate cancer and are felt to be at an increased risk for bone loss related to your treatment or you have advanced prostate cancer that has spread to the bone increasing your risk for bone fractures. It is administered either every 6 months or every month depending on your risk factors for bone fracture.  How can this drug benefit me? Denosumab can reduce the risk of bone fractures and delay complications that occur when cancer has spread to the bones.  What should I do while taking Xgeva or Prolia? You need to have regular dental exams. You need to take calcium 1200mg each day as well as vitamin D 1000 IU per day. There are no restrictions on food, beverages or activity while receiving this medication.  What are the possible side effects of this medication? The most common side effects are fatigue, weakness, nausea and lowering of calcium levels. The most serious reaction os this drug is a serious allergic reaction which is rare. Other serious reactions that would require stopping the medication would be abnormally low blood calcium levels or osteonecrosis of the jaw.  Osteonecrosis of the jaw is a rare condition that involves the loss or breakdown of the jaw bow. Signs and symptoms include pain, swelling or infections of the gums, loosening of the tooth, poor healing of gums, numbness or a  feeling of heaviness in the jaw. You must notify your dentist and your urologist if you have any of these symptoms. Notify your urologist if you need any invasive dental treatments. Notify your dentist prior to starting this treatment. You must provide written clearance from your dentist prior to starting this drug and maintain regular dentist appointments throughout your treatment.   What is involved in receiving this drug? It is a subcutaneous injection (given under the skin) You will have a complete blood chemistry panel drawn prior to starting this drug and repeated every 6 months or as ordered by the doctor. This will check your calcium, magnesium and phosphorus levels.  

## 2019-10-24 DIAGNOSIS — N1832 Chronic kidney disease, stage 3b: Secondary | ICD-10-CM | POA: Diagnosis not present

## 2019-10-24 DIAGNOSIS — R809 Proteinuria, unspecified: Secondary | ICD-10-CM | POA: Diagnosis not present

## 2019-10-24 DIAGNOSIS — E6609 Other obesity due to excess calories: Secondary | ICD-10-CM | POA: Diagnosis not present

## 2019-10-24 DIAGNOSIS — I129 Hypertensive chronic kidney disease with stage 1 through stage 4 chronic kidney disease, or unspecified chronic kidney disease: Secondary | ICD-10-CM | POA: Diagnosis not present

## 2019-11-19 ENCOUNTER — Ambulatory Visit (INDEPENDENT_AMBULATORY_CARE_PROVIDER_SITE_OTHER): Payer: PPO

## 2019-11-19 ENCOUNTER — Other Ambulatory Visit: Payer: Self-pay

## 2019-11-19 DIAGNOSIS — C61 Malignant neoplasm of prostate: Secondary | ICD-10-CM

## 2019-11-19 MED ORDER — DENOSUMAB 120 MG/1.7ML ~~LOC~~ SOLN
120.0000 mg | Freq: Once | SUBCUTANEOUS | Status: AC
  Administered 2019-11-19: 120 mg via SUBCUTANEOUS

## 2019-12-17 ENCOUNTER — Ambulatory Visit (INDEPENDENT_AMBULATORY_CARE_PROVIDER_SITE_OTHER): Payer: PPO

## 2019-12-17 ENCOUNTER — Other Ambulatory Visit: Payer: Self-pay

## 2019-12-17 DIAGNOSIS — C7951 Secondary malignant neoplasm of bone: Secondary | ICD-10-CM | POA: Diagnosis not present

## 2019-12-17 MED ORDER — DENOSUMAB 120 MG/1.7ML ~~LOC~~ SOLN
120.0000 mg | Freq: Once | SUBCUTANEOUS | Status: AC
  Administered 2019-12-17: 120 mg via SUBCUTANEOUS

## 2019-12-17 NOTE — Progress Notes (Signed)
Pt given Xgeva in R lower abd. Today.

## 2019-12-22 ENCOUNTER — Other Ambulatory Visit: Payer: Self-pay | Admitting: "Endocrinology

## 2019-12-22 DIAGNOSIS — E212 Other hyperparathyroidism: Secondary | ICD-10-CM | POA: Diagnosis not present

## 2019-12-22 DIAGNOSIS — E559 Vitamin D deficiency, unspecified: Secondary | ICD-10-CM | POA: Diagnosis not present

## 2019-12-22 DIAGNOSIS — E039 Hypothyroidism, unspecified: Secondary | ICD-10-CM | POA: Diagnosis not present

## 2019-12-22 LAB — COMPREHENSIVE METABOLIC PANEL
Calcium: 9.4 (ref 8.7–10.7)
GFR calc Af Amer: 37
GFR calc non Af Amer: 32

## 2019-12-22 LAB — LIPID PANEL
Cholesterol: 196 (ref 0–200)
HDL: 40 (ref 35–70)
LDL Cholesterol: 122
Triglycerides: 190 — AB (ref 40–160)

## 2019-12-22 LAB — BASIC METABOLIC PANEL
BUN: 24 — AB (ref 4–21)
Creatinine: 2 — AB (ref 0.6–1.3)

## 2019-12-22 LAB — TSH: TSH: 2.64 (ref 0.41–5.90)

## 2019-12-22 LAB — VITAMIN D 25 HYDROXY (VIT D DEFICIENCY, FRACTURES): Vit D, 25-Hydroxy: 40.7

## 2019-12-23 LAB — COMPREHENSIVE METABOLIC PANEL
ALT: 24 IU/L (ref 0–44)
AST: 17 IU/L (ref 0–40)
Albumin/Globulin Ratio: 1.4 (ref 1.2–2.2)
Albumin: 4.1 g/dL (ref 3.7–4.7)
Alkaline Phosphatase: 48 IU/L (ref 44–121)
BUN/Creatinine Ratio: 12 (ref 10–24)
BUN: 24 mg/dL (ref 8–27)
Bilirubin Total: 0.3 mg/dL (ref 0.0–1.2)
CO2: 22 mmol/L (ref 20–29)
Calcium: 9.4 mg/dL (ref 8.6–10.2)
Chloride: 104 mmol/L (ref 96–106)
Creatinine, Ser: 1.96 mg/dL — ABNORMAL HIGH (ref 0.76–1.27)
GFR calc Af Amer: 37 mL/min/{1.73_m2} — ABNORMAL LOW (ref >59)
GFR calc non Af Amer: 32 mL/min/{1.73_m2} — ABNORMAL LOW (ref >59)
Globulin, Total: 2.9 g/dL (ref 1.5–4.5)
Glucose: 157 mg/dL — ABNORMAL HIGH (ref 65–99)
Potassium: 4.9 mmol/L (ref 3.5–5.2)
Sodium: 141 mmol/L (ref 134–144)
Total Protein: 7 g/dL (ref 6.0–8.5)

## 2019-12-23 LAB — LIPID PANEL W/O CHOL/HDL RATIO
Cholesterol, Total: 196 mg/dL (ref 100–199)
HDL: 40 mg/dL (ref >39)
LDL Chol Calc (NIH): 122 mg/dL — ABNORMAL HIGH (ref 0–99)
Triglycerides: 190 mg/dL — ABNORMAL HIGH (ref 0–149)
VLDL Cholesterol Cal: 34 mg/dL (ref 5–40)

## 2019-12-23 LAB — PARATHYROID HORMONE, INTACT (NO CA): PTH: 43 pg/mL (ref 15–65)

## 2019-12-23 LAB — T4, FREE: Free T4: 1.31 ng/dL (ref 0.82–1.77)

## 2019-12-23 LAB — TSH: TSH: 2.64 u[IU]/mL (ref 0.450–4.500)

## 2019-12-23 LAB — VITAMIN D 25 HYDROXY (VIT D DEFICIENCY, FRACTURES): Vit D, 25-Hydroxy: 40.7 ng/mL (ref 30.0–100.0)

## 2019-12-25 ENCOUNTER — Other Ambulatory Visit: Payer: Self-pay | Admitting: Cardiovascular Disease

## 2019-12-30 ENCOUNTER — Ambulatory Visit (INDEPENDENT_AMBULATORY_CARE_PROVIDER_SITE_OTHER): Payer: PPO | Admitting: Nurse Practitioner

## 2019-12-30 ENCOUNTER — Encounter: Payer: Self-pay | Admitting: Nurse Practitioner

## 2019-12-30 ENCOUNTER — Other Ambulatory Visit: Payer: Self-pay

## 2019-12-30 VITALS — BP 139/91 | HR 78 | Wt 274.6 lb

## 2019-12-30 DIAGNOSIS — R7303 Prediabetes: Secondary | ICD-10-CM | POA: Diagnosis not present

## 2019-12-30 DIAGNOSIS — E559 Vitamin D deficiency, unspecified: Secondary | ICD-10-CM

## 2019-12-30 DIAGNOSIS — E212 Other hyperparathyroidism: Secondary | ICD-10-CM | POA: Diagnosis not present

## 2019-12-30 DIAGNOSIS — E039 Hypothyroidism, unspecified: Secondary | ICD-10-CM

## 2019-12-30 LAB — POCT GLYCOSYLATED HEMOGLOBIN (HGB A1C): Hemoglobin A1C: 5.9 % — AB (ref 4.0–5.6)

## 2019-12-30 NOTE — Patient Instructions (Signed)

## 2019-12-30 NOTE — Progress Notes (Signed)
12/30/2019      Endocrinology follow-up note   Subjective:    Patient ID: [name] [name], male    DOB: May 25, 1940, PCP [name], MD   Past Medical History:  Diagnosis Date   Cardiomyopathy [facility])    h/o felt 2nd atrial fib, EF normal by echo 2013   CHF (congestive heart failure) ([facility])    Chronic anticoagulation    Chronic atrial fibrillation ([facility])    Chronic kidney disease (CKD), stage IV (severe) ([place])    Dysrhythmia    Hypothyroidism    Pneumonia    Pre-diabetes    Primary localized osteoarthritis of left hip 05/02/2016   Prostate cancer ([place])    Shortness of breath    Systemic hypertension    Past Surgical History:  Procedure Laterality Date   CARDIOVERSION     CATARACT EXTRACTION W/PHACO  04/17/2011   Procedure: CATARACT EXTRACTION PHACO AND INTRAOCULAR LENS PLACEMENT ([place]);  Surgeon: [name], MD;  Location[facility];  Service: Ophthalmology;  Laterality: Left;  CDE:14.58   CATARACT EXTRACTION W/PHACO  05/22/2011   Procedure: CATARACT EXTRACTION PHACO AND INTRAOCULAR LENS PLACEMENT (IOC);  Surgeon: [name], MD;  [facility];  Service: Ophthalmology;  Laterality: Right;  CDE 13.02   COLONOSCOPY  01/17/2011   Procedure: COLONOSCOPY;  Surgeon: Jamesetta So;  [facility];  Service: Gastroenterology;  Laterality: N/A;   COLONOSCOPY N/A 02/24/2016   Procedure: COLONOSCOPY;  Surgeon: Rogene Houston, MD;  [facility];  Service: Endoscopy;  Laterality: N/A;  12:50   INGUINAL HERNIA REPAIR Right 09/27/2015   Procedure: RIGHT INGUINAL HERNIORRHAPY WITH MESH SMALL BOWEL RESECTION;  Surgeon: Aviva Signs, MD;  Location: [facility];  Service: General;  Laterality: Right;   [place]  02/2009   dipyridamole; mild perfusion defect due to infarct/scar with mild perinfarct ischemia is apical septal, apical basal inferoseptal, basal inferior, mid inferoseptal mid inferior & apical inferior; EKG negative for ischemia; low  risk scan   PROSTATE SURGERY     7 years ago [facility] Dr Javaid-prostatectomy   PROSTATECTOMY     Sleep Study  05/2011   increased upper airway resistance syndrome with mild sleep apnea during REM   TOTAL HIP ARTHROPLASTY Left 05/02/2016   Procedure: LEFT TOTAL HIP ARTHROPLASTY;  Surgeon: Marchia Bond, MD;  Location: [place];  Service: Orthopedics;  Laterality: Left;   TRANSTHORACIC ECHOCARDIOGRAM  04/2011   EF=50-55%; RV mildly dilated; LA mild-mod dilated; mild mitral valve annular calcif, mild MR; mod TR, RVSP elevated 30-7mmHg, mild pulm HTN; AV mildly sclerotic, mild calcif of AV leaflets; trace pulm valve regurg    Social History   Socioeconomic History   Marital status: Single    Spouse name: Not on file   Number of children: 2   Years of education: Not on file   Highest education level: Not on file  Occupational History   Not on file  Tobacco Use   Smoking status: Never Smoker   Smokeless tobacco: Never Used  Vaping Use   Vaping Use: Never used  Substance and Sexual Activity   Alcohol use: No   Drug use: No   Sexual activity: Yes    Birth control/protection: None  Other Topics Concern   Not on file  Social History Narrative   Not on file   Social Determinants of Health   Financial Resource Strain: Not on file  Food Insecurity: Not on file  Transportation Needs: Not on file  Physical Activity: Not on file  Stress: Not on file  Social Connections: Not on file   Outpatient Encounter Medications as of 12/30/2019  Medication Sig   Calcium Carb-Cholecalciferol (CALCIUM 600+D3) 600-800 MG-UNIT TABS Take 1 tablet by mouth 2 (two) times daily.   ferrous sulfate 325 (65 FE) MG tablet Take 325 mg by mouth daily with breakfast.   levothyroxine (SYNTHROID) 100 MCG tablet Take 1 tablet (100 mcg total) by mouth daily before breakfast.   metoprolol tartrate (LOPRESSOR) 50 MG tablet Take 1 tablet by mouth twice daily   sodium bicarbonate 650 MG tablet Take 650  mg by mouth 2 (two) times daily.   XARELTO 15 MG TABS tablet TAKE 1 TABLET BY MOUTH ONCE DAILY WITH SUPPER   Facility-Administered Encounter Medications as of 12/30/2019  Medication   neomycin-polymyxin-dexameth (MAXITROL) 0.1 % ophth ointment   ALLERGIES: Allergies  Allergen Reactions   No Known Allergies    VACCINATION STATUS: Immunization History  Administered Date(s) Administered   Moderna Sars-Covid-2 Vaccination 03/29/2019, 04/30/2019    HPI  79 yr old male with above medical history. 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   He was found to have primary hypothyroidism approximate at age of 62 years and was initiated on levothyroxine currently at 88 mcg p.o. every morning.   -He is accompanied by his son.  Patient is compliant, no new complaints today.    he denies cold/heat intolerance.  -He denies history of goiter, nor family history of thyroid cancer.  -  he survives prostate cancer. -he has history of tertiary hyperparathyroidism with PTH as high as 10 which is currently improving to 18 with supplement of calcium and vitamin D. His most recent calcium is corrected at 8.9, with corresponding PTH of 44.  his prior 24 hour urine calcium has been  low at 57.   Review of systems  Constitutional: + Minimally fluctuating body weight,  current Body mass index is 36.73 kg/m. , no fatigue, no subjective hyperthermia, no subjective hypothermia Eyes: no blurry vision, no xerophthalmia ENT: no sore throat, no nodules palpated in throat, no dysphagia/odynophagia, no hoarseness Cardiovascular: no chest pain, no shortness of breath, no palpitations, no leg swelling Respiratory: no cough, no shortness of breath Gastrointestinal: no nausea/vomiting/diarrhea Musculoskeletal: no muscle/joint aches, walks with cane due to disequilibrium Skin: no rashes, no  hyperemia Neurological: no tremors, no numbness, no tingling, no dizziness Psychiatric: no depression, no anxiety    Objective:    BP (!) 139/91 (BP Location: Left Arm, Patient Position: Sitting)    Pulse 78    Wt 274 lb 9.6 oz (124.6 kg)    BMI 36.73 kg/m   Wt Readings from Last 3 Encounters:  12/30/19 274 lb 9.6 oz (124.6 kg)  09/16/19 272 lb (123.4 kg)  08/12/19 272 lb (123.4 kg)    BP Readings from Last 3 Encounters:  12/30/19 (!) 139/91  09/16/19 (!) 152/76  08/12/19 (!) 156/98     Physical Exam- Limited  Constitutional:  Body mass index is 36.73 kg/m. , not in acute distress, normal state of mind Eyes:  EOMI, no exophthalmos Neck: Supple Respiratory: Adequate breathing efforts Musculoskeletal:  + Walks with a cane due to disequilibrium, strength intact in all four extremities, no gross restriction of joint movements Skin:  no rashes, no hyperemia Neurological: no tremor with outstretched hands,    Results for orders placed or performed in visit on 12/30/19  HgB A1c  Result Value Ref Range   Hemoglobin A1C 5.9 (A) 4.0 - 5.6 %   HbA1c POC (<> result, manual entry)     HbA1c, POC (prediabetic range)     HbA1c, POC (controlled diabetic range)      Lipid Panel     Component Value Date/Time   CHOL 196 12/22/2019 0950   TRIG 190 (H) 12/22/2019 0950   HDL 40 12/22/2019 0950   CHOLHDL 4.3 02/04/2009 0538   VLDL 24 02/04/2009 0538   LDLCALC 122 (H) 12/22/2019 0950   10/24/2019 labs: Calcium 8.9, PTH 44 TSH 4.77, free T4 1.27 Vitamin D 34.3   Assessment & Plan:   1.  Hypothyroidism -His previsit thyroid function tests are consistent with appropriate hormone replacement.  He is advised to continue Levothyroxine 100 mcg po daily before breakfast.   - We discussed about the correct intake of his thyroid hormone, on empty stomach at fasting, with water, separated by at least 30 minutes from breakfast and other medications,  and separated by more than 4 hours from  calcium, iron, multivitamins, acid reflux medications (PPIs). -Patient is made aware of the fact that thyroid hormone replacement is needed for life, dose to be adjusted by periodic monitoring of thyroid function tests.  2. Tertiary hyperparathyroidism   His last PTH is stable at 44, associated with normal calcium of 8.9.    -He is now vitamin D replete at 40.  He is advised to maintain low-dose over-the-counter vitamin D/calcium supplement.  He does not need high-dose prescription strength vitamin D. His prior 24 hour urine calcium was low at 57 previously.  3. Hypertension:   His BP is controlled to target.  He is advised to continue his Metoprolol 50 mg po twice daily.  4.  Obesity with prediabetes His Body mass index is 36.73 kg/m. putting him at risk for diabetes, his point-of-care A1c today is 5.9% (stable overall).  He does not need medication intervention at this time.  - Nutritional counseling repeated at each appointment due to patients tendency to fall back in to old habits.  - The patient admits there is a room for improvement in their diet and drink choices. -  Suggestion is made for the patient to avoid simple carbohydrates from their diet including Cakes, Sweet Desserts / Pastries, Ice Cream, Soda (diet and regular), Sweet Tea, Candies, Chips, Cookies, Sweet Pastries,  Store Bought Juices, Alcohol in Excess of  1-2 drinks a day, Artificial Sweeteners, Coffee Creamer, and "Sugar-free" Products. This will help patient to have stable blood glucose profile and potentially avoid unintended weight gain.   - I encouraged the patient to switch to  unprocessed or minimally processed complex starch and increased protein intake (animal or plant source), fruits, and vegetables.   - Patient is advised to stick to a routine mealtimes to eat 3 meals  a day and avoid unnecessary snacks ( to snack only to correct hypoglycemia).    - I advised patient to maintain close follow up with [name], MD for primary care needs.   - Time spent on this patient care encounter:  20 min, of which > 50% was spent in  counseling and the rest reviewing his blood glucose logs , discussing his hypoglycemia and hyperglycemia episodes, reviewing his current and  previous labs / studies  ( including abstraction from other facilities) and medications  doses and developing a  long term treatment plan and documenting his care.   Please refer to  Patient Instructions for Blood Glucose Monitoring and Insulin/Medications Dosing Guide"  in media tab for additional information. Please  also refer to " Patient Self Inventory" in the Media  tab for reviewed elements of pertinent patient history.  Lendon Ka Sawaya participated in the discussions, expressed understanding, and voiced agreement with the above plans.  All questions were answered to his satisfaction. he is encouraged to contact clinic should he have any questions or concerns prior to his return visit.   Follow up plan: Return in about 6 months (around 06/29/2020) for Thyroid follow up, Previsit labs.  Rayetta Pigg, [facility] [address] [place], [place] [address] Phone: [phone] Fax: [phone]    12/30/2019, 9:43 AM

## 2020-01-14 ENCOUNTER — Other Ambulatory Visit: Payer: Self-pay | Admitting: "Endocrinology

## 2020-01-14 ENCOUNTER — Other Ambulatory Visit: Payer: Self-pay | Admitting: Cardiovascular Disease

## 2020-01-15 NOTE — Telephone Encounter (Signed)
49 M 124.6 kg, 72", SCr 1.96, CrCl 26.9 (with IBW), next OV [facility] 01/21/20

## 2020-01-15 NOTE — Telephone Encounter (Signed)
Please review for refill. Thanks!  

## 2020-01-20 ENCOUNTER — Encounter: Payer: Self-pay | Admitting: Urology

## 2020-01-20 ENCOUNTER — Ambulatory Visit (INDEPENDENT_AMBULATORY_CARE_PROVIDER_SITE_OTHER): Payer: PPO | Admitting: Urology

## 2020-01-20 ENCOUNTER — Other Ambulatory Visit: Payer: Self-pay

## 2020-01-20 VITALS — BP 125/71 | HR 82 | Temp 97.9°F | Ht 74.5 in | Wt 286.0 lb

## 2020-01-20 DIAGNOSIS — C61 Malignant neoplasm of prostate: Secondary | ICD-10-CM

## 2020-01-20 DIAGNOSIS — C7951 Secondary malignant neoplasm of bone: Secondary | ICD-10-CM | POA: Diagnosis not present

## 2020-01-20 LAB — MICROSCOPIC EXAMINATION
Renal Epithel, UA: NONE SEEN /hpf
WBC, UA: >30 /hpf — AB (ref 0–5)

## 2020-01-20 LAB — URINALYSIS, ROUTINE W REFLEX MICROSCOPIC
Bilirubin, UA: NEGATIVE
Glucose, UA: NEGATIVE
Ketones, UA: NEGATIVE
Nitrite, UA: NEGATIVE
Specific Gravity, UA: 1.02 (ref 1.005–1.030)
Urobilinogen, Ur: 0.2 mg/dL (ref 0.2–1.0)
pH, UA: 5.5 (ref 5.0–7.5)

## 2020-01-20 MED ORDER — DENOSUMAB 120 MG/1.7ML ~~LOC~~ SOLN
120.0000 mg | Freq: Once | SUBCUTANEOUS | Status: AC
  Administered 2020-01-20: 120 mg via SUBCUTANEOUS

## 2020-01-20 MED ORDER — LEUPROLIDE ACETATE (4 MONTH) 30 MG ~~LOC~~ KIT
30.0000 mg | PACK | Freq: Once | SUBCUTANEOUS | Status: AC
  Administered 2020-01-20: 30 mg via SUBCUTANEOUS

## 2020-01-20 NOTE — Progress Notes (Signed)
SubQ Injection  Patient is present today for a SubQ Injection for treatment of Prostate cancer Drug: Xgeva Dose: 70mg  Location: [facility] of abd Lot: [id number] Exp: 03/10/22  Patient tolerated well, no complications were noted  Preformed by: Antionette Char, Terrance Usery,LPN  Follow up/ Additional Notes: 1 month

## 2020-01-20 NOTE — Progress Notes (Signed)
H&P  Chief Complaint: Prostate Cancer  History of Present Illness:   1.11.2022: Pt denies any new aches or pains, but notes frequent hot-flashes. He denies having any new concerns at this time and has remained relatively stable between visits. No bony pain or wt loss.  IPSS Questionnaire (AUA-7): Over the past month.   1)  How often have you had a sensation of not emptying your bladder completely after you finish urinating?  2 - Less than half the time  2)  How often have you had to urinate again less than two hours after you finished urinating? 2 - Less than half the time  3)  How often have you found you stopped and started again several times when you urinated?  0 - Not at all  4) How difficult have you found it to postpone urination?  1 - Less than 1 time in 5  5) How often have you had a weak urinary stream?  3 - About half the time  6) How often have you had to push or strain to begin urination?  0 - Not at all  7) How many times did you most typically get up to urinate from the time you went to bed until the time you got up in the morning?  3 - 3 times  Total score:  0-7 mildly symptomatic   8-19 moderately symptomatic   20-35 severely symptomatic  QoL Score: 2   (below copied from [facility] records):  3.16.2021:Here today for 4 mo lupron and 1 mo xgeva. No recent labs. He reports that, since last seen for OV, he has been doing quite well with no significant urinary complaints. He is having occasional hot flashes but these do not bother him. He denies any changes in weight, appetite, or energy level. No new aches or pains.  Prostate Cancer:  [name] [name] is a 80 year-old male established patient who is here for prostate cancer which has been treated.  He did receive radiation therapy for his cancer. He was treated with xrt for his cancer. Patient denies brachytherapy and high dose radiation. His radiation treatment was complete approximately 08/09/2000.   Initial diagnosis  2002--PSA 12, apparently Gleason score 5 (2+3). 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  Underwent EBRT--35 treatments.   CT abdomen and pelvis on 11.20.2017, performed for gross hematuria. This revealed an absent prostate, no evidence for recurrent tumor or metastatic disease within the abdomen or pelvis. Bilateral renal calculi noted. Sclerotic lesion involving L4 vertebra favored to represent a bone island.   2.12.2019: Most recent PSA is up to 5.7. He denies bony pain. He denies significant weight loss.  4.8.2019: Bone scan--abnml uptake in L4 vertebral bodyc/w met PCa  5.7.2019: CT (03/12/2017, 05/07/2017) revealed larger L4 sclerotic met w/ several new foci--L3, T12, left 8th rib, right ilium. No complaints of bony pain.   5.7.2019: He was started on ADT with Firmagon   6.25.2019: first Lupron injection.   10.29.2019: Receiving Xgeva on a monthly basis. He has not had recent laboratories drawn. He is having no blood in his urine or stool. He states that he has a good urinary stream. He has had some recent back pain, but he spent about an hour and half on a lawnmower recently. He is having hot flashes. Most recent PSA from June of this year, following initiation of androgen deprivation therapy was down to 0.2.   3.3.2020: Most recent PSA from October 27, 2017 was less than 0.1. He apparently had labs drawn last week, but the only lab returned was a basic metabolic panel. He has had hot flashes. He denies blood in his urine or stool. He does have urgency and occasional urgency incontinence. No dysuria. He has a good stream. He has limited mobility.   11.3.2020: PSA 6.2020--<0.1. T level castrate. Here today for lupron. He has been having hot flashes but states that these are tolerable given the weather has gotten a lot colder. He is due a repeat PSA. He denies any dysuria or hematuria. He denies any new urinary complaints. He  states that his boney pain has since resolved.  8.3.2020: Pt denies any LUTS and hematuria or loss of appetite or weight. Pt submits no questions or concerns at this time regarding his health. Pt has multiple questions about billing for his ADT.  9.7.2021: Pt states that he would like to continue Lupron instead of proceeding with surgical castration. Pt reports that he is feeling well and has no new aches or pains. He brings in billing info w/ him.  Past Medical History:  Diagnosis Date  . Cardiomyopathy [facility])    h/o felt 2nd atrial fib, EF normal by echo 2013  . CHF (congestive heart failure) ([place])   . Chronic anticoagulation   . Chronic atrial fibrillation ([place])   . Chronic kidney disease (CKD), stage IV (severe) ([place])   . Dysrhythmia   . Hypothyroidism   . Pneumonia   . Pre-diabetes   . Primary localized osteoarthritis of left hip 05/02/2016  . Prostate cancer ([place])   . Shortness of breath   . Systemic hypertension     Past Surgical History:  Procedure Laterality Date  . CARDIOVERSION    . CATARACT EXTRACTION W/PHACO  04/17/2011   Procedure: CATARACT EXTRACTION PHACO AND INTRAOCULAR LENS PLACEMENT (IOC);  Surgeon: [name], MD;  Location: [facility];  Service: Ophthalmology;  Laterality: Left;  CDE:14.58  . CATARACT EXTRACTION W/PHACO  05/22/2011   Procedure: CATARACT EXTRACTION PHACO AND INTRAOCULAR LENS PLACEMENT (IOC);  Surgeon: [name], MD;  Location: [facility];  Service: Ophthalmology;  Laterality: Right;  CDE 13.02  . COLONOSCOPY  01/17/2011   Procedure: COLONOSCOPY;  Surgeon: Jamesetta So;  Location[facility];  Service: Gastroenterology;  Laterality: N/A;  . COLONOSCOPY N/A 02/24/2016   Procedure: COLONOSCOPY;  Surgeon: Rogene Houston, MD;  Location[facility];  Service: Endoscopy;  Laterality: N/A;  12:50  . INGUINAL HERNIA REPAIR Right 09/27/2015   Procedure: RIGHT INGUINAL HERNIORRHAPY WITH MESH SMALL BOWEL RESECTION;  Surgeon: Aviva Signs, MD;  Location: [facility];   Service: General;  Laterality: Right;  . NM MYOCAR [place]  02/2009   dipyridamole; mild perfusion defect due to infarct/scar with mild perinfarct ischemia is apical septal, apical basal inferoseptal, basal inferior, mid inferoseptal mid inferior & apical inferior; EKG negative for ischemia; low risk scan  . PROSTATE SURGERY     7 years ago [facility] Dr Javaid-prostatectomy  . PROSTATECTOMY    . Sleep Study  05/2011   increased upper airway resistance syndrome with mild sleep apnea during REM  . TOTAL HIP ARTHROPLASTY Left 05/02/2016   Procedure: LEFT TOTAL HIP ARTHROPLASTY;  Surgeon: Marchia Bond, MD;  Location: [place];  Service: Orthopedics;  Laterality: Left;  . TRANSTHORACIC ECHOCARDIOGRAM  04/2011   EF=50-55%; RV mildly dilated; LA mild-mod dilated; mild mitral valve annular calcif, mild MR; mod TR, RVSP elevated 30-8mHg, mild pulm HTN; AV  mildly sclerotic, mild calcif of AV leaflets; trace pulm valve regurg     Home Medications:  Allergies as of 01/20/2020      Reactions   No Known Allergies       Medication List       Accurate as of January 20, 2020  9:00 AM. If you have any questions, ask your nurse or doctor.        Calcium 600+D3 600-800 MG-UNIT Tabs Generic drug: Calcium Carb-Cholecalciferol Take 1 tablet by mouth 2 (two) times daily.   Euthyrox 100 MCG tablet Generic drug: levothyroxine TAKE 1 TABLET BY MOUTH ONCE DAILY BEFORE BREAKFAST   ferrous sulfate 325 (65 FE) MG tablet Take 325 mg by mouth daily with breakfast.   metoprolol tartrate 50 MG tablet Commonly known as: LOPRESSOR Take 1 tablet by mouth twice daily   sodium bicarbonate 650 MG tablet Take 650 mg by mouth 2 (two) times daily.   Xarelto 15 MG Tabs tablet Generic drug: Rivaroxaban TAKE 1 TABLET BY MOUTH ONCE DAILY WITH SUPPER       Allergies:  Allergies  Allergen Reactions  . No Known Allergies     Family History  Problem Relation Age of Onset  . Diabetes Mother   . Prostate  cancer Father   . Anesthesia problems Neg Hx   . Hypotension Neg Hx   . Malignant hyperthermia Neg Hx   . Pseudochol deficiency Neg Hx     Social History:  reports that he has never smoked. He has never used smokeless tobacco. He reports that he does not drink alcohol and does not use drugs.  ROS: A complete review of systems was performed.  All systems are negative except for pertinent findings as noted.  Physical Exam:  Vital signs in last 24 hours: There were no vitals taken for this visit. Constitutional:  Alert and oriented, No acute distress Cardiovascular: Regular rate  Respiratory: Normal respiratory effort GI: Abdomen is soft, nontender, nondistended, no abdominal masses. No CVAT.  Genitourinary: Normal male phallus, testes are descended bilaterally and non-tender and without masses, scrotum is normal in appearance without lesions or masses, perineum is normal on inspection. Neurologic: Grossly intact, no focal deficits Psychiatric: Normal mood and affect  I have reviewed prior pt notes  I have reviewed notes from referring/previous physicians  I have reviewed urinalysis results   I have independently reviewed prior imaging--last done 2019  I have reviewed prior PSA results--from 8.2021    Impression/Assessment:  PCa--metastatic - Pt tolerating ADT well. His disease is being well managed under his current treatment regimen (ADT/bone therapy). His PSA & T are pending for today.  Plan:  1. Pt will have his PSA & Testosterone drawn today.  2. Pt received his Lupron shot today.  3. Pt encouraged to supplement his diet with Calcium & Vitamin D.  4. F/U in 4 months for OV, Lupron, PSA, Testosterone, imaging  & symptom recheck.  CC: Dr. [name]

## 2020-01-20 NOTE — Addendum Note (Signed)
Addended by: Valentina Lucks on: 01/20/2020 12:04 PM   Modules accepted: Orders

## 2020-01-20 NOTE — Progress Notes (Signed)
Urological Symptom Review  Patient is experiencing the following symptoms: Frequent urination Getting up at night  Leakage Weak stream  Review of Systems  Gastrointestinal (upper)  : Negative for upper GI symptoms  Gastrointestinal (lower) : Negative for lower GI symptoms  Constitutional : Night Sweats  Skin: Negative for skin symptoms  Eyes: Negative for eye symptoms  Ear/Nose/Throat : Negative for Ear/Nose/Throat symptoms  Hematologic/Lymphatic: Easy bruising  Cardiovascular : Negative for cardiovascular symptoms  Respiratory : Shortness of breath  Endocrine: Negative for endocrine symptoms  Musculoskeletal: Back pain Joint pain  Neurological: Negative for neurological symptoms  Psychologic: Negative for psychiatric symptoms

## 2020-01-20 NOTE — Progress Notes (Signed)
Eligard SubQ Injection   Due to Prostate Cancer patient is present today for a Eligard Injection.  Medication: Eligard 4 month Dose: 30 mg  Location: left outer buttock Lot: [id number] Exp: 02/09/21  Patient tolerated well, no complications were noted  Performed by: Antionette Char, Reynald Woods,LPN

## 2020-01-21 ENCOUNTER — Ambulatory Visit: Payer: PPO | Admitting: Cardiovascular Disease

## 2020-01-21 ENCOUNTER — Encounter: Payer: Self-pay | Admitting: Cardiovascular Disease

## 2020-01-21 VITALS — BP 102/66 | HR 114 | Ht 74.5 in | Wt 274.0 lb

## 2020-01-21 DIAGNOSIS — Z7901 Long term (current) use of anticoagulants: Secondary | ICD-10-CM | POA: Diagnosis not present

## 2020-01-21 DIAGNOSIS — E039 Hypothyroidism, unspecified: Secondary | ICD-10-CM

## 2020-01-21 DIAGNOSIS — E782 Mixed hyperlipidemia: Secondary | ICD-10-CM

## 2020-01-21 DIAGNOSIS — N1832 Chronic kidney disease, stage 3b: Secondary | ICD-10-CM | POA: Diagnosis not present

## 2020-01-21 DIAGNOSIS — I951 Orthostatic hypotension: Secondary | ICD-10-CM

## 2020-01-21 DIAGNOSIS — I482 Chronic atrial fibrillation, unspecified: Secondary | ICD-10-CM | POA: Diagnosis not present

## 2020-01-21 LAB — TESTOSTERONE: Testosterone: 15 ng/dL — ABNORMAL LOW (ref 264–916)

## 2020-01-21 LAB — PSA: Prostate Specific Ag, Serum: 1.2 ng/mL (ref 0.0–4.0)

## 2020-01-21 MED ORDER — METOPROLOL TARTRATE 50 MG PO TABS: 75.0000 mg | ORAL_TABLET | Freq: Two times a day (BID) | ORAL | 5 refills | Status: DC

## 2020-01-21 NOTE — Progress Notes (Signed)
Cardiology Office Note    Date:  01/26/2020   ID:  [name] [name], DOB [birth year], MRN [id number]  PCP:  [name], MD  Cardiologist:   [name], MD   Chief Complaint  Patient presents with  . Follow-up    12 months.    History of Present Illness:  [name] [name] is a 80 y.o. 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  He is here accompanied by his son (both his son and his daughter-in-law are also my patients).  Overall doing well, but has become more sedentary during the coronavirus pandemic.  He is not aware of his arrhythmia and does not have palpitations, even though his heart rate today is 114 bpm at rest.  No symptoms of hyper or hypothyroidism on current dose of levothyroxine supplementation.  Denies dizziness or syncope.  Has not had any change in his level of dyspnea (NYHA functional class II) and denies edema, orthopnea or PND.  He has never complained of angina pectoris.  He has maintained his weight, moderately obese range.  Denies any focal neurological complaints.  He has not had any falls, serious injuries or bleeding.   His nephrologist is Dr. Lowanda Foster and his endocrinologist is Dr. Dorris Fetch.  His hemoglobin A1c remains excellent at 5.9% and his most recent creatinine is stable at 1.96 (12/22/2019).  However, his LDL remains elevated at 122 (borderline low HDL at 40).  A nuclear stress test in 2011 showed reported scar with mild peri-infarct ischemia in the apex and the inferior wall. It was felt to be a low risk scan since no significant ischemia was demonstrated. In August 2017 left ventricular ejection fraction by echo was 50-55% without regional wall motion abnormalities. Both atria were mildly dilated. No significant valvular abnormalities were reported. Sleep study in 2013 showed upper  airway resistance syndrome without frank sleep apnea. 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  Past Medical History:  Diagnosis Date  . Cardiomyopathy [facility])    h/o felt 2nd atrial fib, EF normal by echo 2013  . CHF (congestive heart failure) ([place])   . Chronic anticoagulation   . Chronic atrial fibrillation ([place])   . Chronic kidney disease (CKD), stage IV (severe) ([place])   . Dysrhythmia   . Hypothyroidism   . Pneumonia   . Pre-diabetes   . Primary localized osteoarthritis of left hip 05/02/2016  . Prostate cancer ([place])   . Shortness of breath   . Systemic hypertension     Past Surgical History:  Procedure Laterality Date  . CARDIOVERSION    . CATARACT EXTRACTION W/PHACO  04/17/2011   Procedure: CATARACT EXTRACTION PHACO AND INTRAOCULAR LENS PLACEMENT (IOC);  Surgeon: [name], MD;  Location: [facility];  Service: Ophthalmology;  Laterality: Left;  CDE:14.58  . CATARACT EXTRACTION W/PHACO  05/22/2011   Procedure: CATARACT EXTRACTION PHACO AND INTRAOCULAR LENS PLACEMENT (IOC);  Surgeon: [name], MD;  Location: [facility];  Service: Ophthalmology;  Laterality: Right;  CDE 13.02  . COLONOSCOPY  01/17/2011   Procedure: COLONOSCOPY;  Surgeon: Jamesetta So;  [facility];  Service: Gastroenterology;  Laterality: N/A;  . COLONOSCOPY N/A 02/24/2016   Procedure: COLONOSCOPY;  Surgeon: Rogene Houston, MD;  [facility];  Service: Endoscopy;  Laterality: N/A;  12:50  . INGUINAL HERNIA REPAIR Right 09/27/2015  Procedure: RIGHT INGUINAL HERNIORRHAPY WITH MESH SMALL BOWEL RESECTION;  Surgeon: Aviva Signs, MD;  Location: [facility];  Service: General;  Laterality: Right;  . NM MYOCAR [place]  02/2009   dipyridamole; mild perfusion defect due to infarct/scar with mild perinfarct ischemia is apical septal, apical basal inferoseptal, basal inferior, mid inferoseptal mid inferior & apical inferior; EKG negative  for ischemia; low risk scan  . PROSTATE SURGERY     7 years ago [facility] Dr Javaid-prostatectomy  . PROSTATECTOMY    . Sleep Study  05/2011   increased upper airway resistance syndrome with mild sleep apnea during REM  . TOTAL HIP ARTHROPLASTY Left 05/02/2016   Procedure: LEFT TOTAL HIP ARTHROPLASTY;  Surgeon: Marchia Bond, MD;  Location: [place];  Service: Orthopedics;  Laterality: Left;  . TRANSTHORACIC ECHOCARDIOGRAM  04/2011   EF=50-55%; RV mildly dilated; LA mild-mod dilated; mild mitral valve annular calcif, mild MR; mod TR, RVSP elevated 30-40mmHg, mild pulm HTN; AV mildly sclerotic, mild calcif of AV leaflets; trace pulm valve regurg     Current Medications: Outpatient Medications Prior to Visit  Medication Sig Dispense Refill  . Calcium Carb-Cholecalciferol (CALCIUM 600+D3) 600-800 MG-UNIT TABS Take 1 tablet by mouth 2 (two) times daily. 90 tablet 1  . EUTHYROX 100 MCG tablet TAKE 1 TABLET BY MOUTH ONCE DAILY BEFORE BREAKFAST 90 tablet 0  . ferrous sulfate 325 (65 FE) MG tablet Take 325 mg by mouth daily with breakfast.    . sodium bicarbonate 650 MG tablet Take 650 mg by mouth 2 (two) times daily.    Alveda Reasons 15 MG TABS tablet TAKE 1 TABLET BY MOUTH ONCE DAILY WITH SUPPER 90 tablet 1  . metoprolol tartrate (LOPRESSOR) 50 MG tablet Take 1 tablet by mouth twice daily 180 tablet 0   Facility-Administered Medications Prior to Visit  Medication Dose Route Frequency Provider Last Rate Last Admin  . neomycin-polymyxin-dexameth (MAXITROL) 0.1 % ophth ointment    PRN [name], MD   1 application at 19/14/78 2956     Allergies:   No known allergies   Social History   Socioeconomic History  . Marital status: Single    Spouse name: Not on file  . Number of children: 2  . Years of education: Not on file  . Highest education level: Not on file  Occupational History  . Not on file  Tobacco Use  . Smoking status: Never Smoker  . Smokeless tobacco: Never Used  Vaping Use  . Vaping Use:  Never used  Substance and Sexual Activity  . Alcohol use: No  . Drug use: No  . Sexual activity: Yes    Birth control/protection: None  Other Topics Concern  . Not on file  Social History Narrative  . Not on file   Social Determinants of Health   Financial Resource Strain: Not on file  Food Insecurity: Not on file  Transportation Needs: Not on file  Physical Activity: Not on file  Stress: Not on file  Social Connections: Not on file     Family History:  The patient's  family history includes Diabetes in his mother; Prostate cancer in his father.   ROS:   Please see the history of present illness.   All other systems are reviewed and are negative.   PHYSICAL EXAM:   VS:  BP 102/66 (BP Location: Left Arm, Patient Position: Sitting, Cuff Size: Large)   Pulse (!) 114   Ht 6' 2.5" (1.892 m)   Wt 274 lb (  124.3 kg)   BMI 34.71 kg/m      General: Alert, oriented x3, no distress, obese Head: no evidence of trauma, PERRL, EOMI, no exophtalmos or lid lag, no myxedema, no xanthelasma; normal ears, nose and oropharynx Neck: normal jugular venous pulsations and no hepatojugular reflux; brisk carotid pulses without delay and no carotid bruits Chest: clear to auscultation, no signs of consolidation by percussion or palpation, normal fremitus, symmetrical and full respiratory excursions Cardiovascular: normal position and quality of the apical impulse, regular rhythm, normal first and second heart sounds, no murmurs, rubs or gallops Abdomen: no tenderness or distention, no masses by palpation, no abnormal pulsatility or arterial bruits, normal bowel sounds, no hepatosplenomegaly Extremities: no clubbing, cyanosis or edema; 2+ radial, ulnar and brachial pulses bilaterally; 2+ right femoral, posterior tibial and dorsalis pedis pulses; 2+ left femoral, posterior tibial and dorsalis pedis pulses; no subclavian or femoral bruits Neurological: grossly nonfocal Psych: Normal mood and  affect   Wt Readings from Last 3 Encounters:  01/21/20 274 lb (124.3 kg)  01/20/20 286 lb (129.7 kg)  12/30/19 274 lb 9.6 oz (124.6 kg)      Studies/Labs Reviewed:   EKG:  EKG is ordered today.  It shows coarse atrial fibrillation with rapid ventricular response.  At times the rhythm resembles atrial flutter.  He has left anterior fascicular block with left axis deviation but no ischemic changes.  QTc 421 ms.  Recent Labs: 12/22/2019: ALT 24; BUN 24; Creatinine, Ser 1.96; Potassium 4.9; Sodium 141; TSH 2.640   Lipid Panel    Component Value Date/Time   CHOL 196 12/22/2019 0950   TRIG 190 (H) 12/22/2019 0950   HDL 40 12/22/2019 0950   CHOLHDL 4.3 02/04/2009 0538   VLDL 24 02/04/2009 0538   LDLCALC 122 (H) 12/22/2019 0950    ASSESSMENT:    1. Chronic a-fib ([place])   2. Orthostatic syncope   3. Long term current use of anticoagulant therapy   4. Acquired hypothyroidism   5. Stage 3b chronic kidney disease ([place])   6. Mixed hyperlipidemia      PLAN:  In order of problems listed above:  1. AFib: Poor rate control.  Very important to maintain normal rates because of his history of tachycardia related cardiomyopathy.  Increase the dose of metoprolol to 75 mg twice daily.  CHADSVasc 4 (age 19, HTN, HF).  On anticoagulation, Xarelto dose adjusted for renal function. 2. history of orthostatic syncope: This improved after correction of his anemia, no recent recurrence.  Continues to run a relatively low blood pressure. 3. Xarelto: No bleeding complications.  Dose of Xarelto appropriately adjusted for renal function.  No history of stroke/TIA.  Has known hemorrhoids and diverticulosis.  no recent bleeding events.  Consider switching to Eliquis if he has GI bleeding or if renal function deteriorates further. 4. Hypothyroidism: Recent TSH shows therapeutic dose of levothyroxine.  Clinically euvolemic 5. CKD stage 3-4: Stable creatinine at about 1.8-2.2 (GFR 30-35).  Most recent creatinine  1.96. 6. HLP: Disadvantageous LDL/HDL pattern.Marland Kitchen  His LDL is close to the range where we would recommend pharmacological therapy.  He does not have full-blown diabetes but does have prediabetes.  Triglycerides are also high.  All these values would improve with weight loss.    Medication Adjustments/Labs and Tests Ordered: Current medicines are reviewed at length with the patient today.  Concerns regarding medicines are outlined above.  Medication changes, Labs and Tests ordered today are listed in the Patient Instructions below. Patient Instructions  Medication Instructions:  INCREASE the Metoprolol Tartrate to 75 mg twice daily ( a tablet and a half)  *If you need a refill on your cardiac medications before your next appointment, please call your pharmacy*   Lab Work: None ordered If you have labs (blood work) drawn today and your tests are completely normal, you will receive your results only by: Marland Kitchen MyChart Message (if you have MyChart) OR . A paper copy in the mail If you have any lab test that is abnormal or we need to change your treatment, we will call you to review the results.   Testing/Procedures: None ordered   Follow-Up: At [facility], you and your health needs are our priority.  As part of our continuing mission to provide you with exceptional heart care, we have created designated Provider Care Teams.  These Care Teams include your primary Cardiologist (physician) and Advanced Practice Providers (APPs -  Physician Assistants and Nurse Practitioners) who all work together to provide you with the care you need, when you need it.  We recommend signing up for the patient portal called "MyChart".  Sign up information is provided on this After Visit Summary.  MyChart is used to connect with patients for Virtual Visits (Telemedicine).  Patients are able to view lab/test results, encounter notes, upcoming appointments, etc.  Non-urgent messages can be sent to your provider as  well.   To learn more about what you can do with MyChart, go to [email or web address].    Your next appointment:   3 month(s)  The format for your next appointment:   In Person  Provider:   You may see [name], MD or one of the following Advanced Practice Providers on your designated Care Team:    Almyra Deforest, PA-C  Fabian Sharp, [place] or   Roby Lofts, PA-C       Signed, [name], MD  01/26/2020 3:30 PM    [name] [name], [place], [place]  [address] Phone: [phone]; Fax: [phone]

## 2020-01-21 NOTE — Patient Instructions (Addendum)
Medication Instructions:  INCREASE the Metoprolol Tartrate to 75 mg twice daily ( a tablet and a half)  *If you need a refill on your cardiac medications before your next appointment, please call your pharmacy*   Lab Work: None ordered If you have labs (blood work) drawn today and your tests are completely normal, you will receive your results only by: Marland Kitchen MyChart Message (if you have MyChart) OR . A paper copy in the mail If you have any lab test that is abnormal or we need to change your treatment, we will call you to review the results.   Testing/Procedures: None ordered   Follow-Up: At [facility], you and your health needs are our priority.  As part of our continuing mission to provide you with exceptional heart care, we have created designated Provider Care Teams.  These Care Teams include your primary Cardiologist (physician) and Advanced Practice Providers (APPs -  Physician Assistants and Nurse Practitioners) who all work together to provide you with the care you need, when you need it.  We recommend signing up for the patient portal called "MyChart".  Sign up information is provided on this After Visit Summary.  MyChart is used to connect with patients for Virtual Visits (Telemedicine).  Patients are able to view lab/test results, encounter notes, upcoming appointments, etc.  Non-urgent messages can be sent to your provider as well.   To learn more about what you can do with MyChart, go to [email or web address].    Your next appointment:   3 month(s)  The format for your next appointment:   In Person  Provider:   You may see [name], MD or one of the following Advanced Practice Providers on your designated Care Team:    Almyra Deforest, PA-C  Fabian Sharp, PA-C or   Roby Lofts, [place]

## 2020-01-27 ENCOUNTER — Telehealth: Payer: Self-pay

## 2020-01-27 NOTE — Telephone Encounter (Signed)
-----   Message from Franchot Gallo, MD sent at 01/27/2020 12:21 PM EST ----- Notify pt--PSA up slightly to 1.2--will cont to follow--will call w/ upcoming XR results ----- Message ----- From: Dorisann Frames, RN Sent: 01/21/2020   8:37 AM EST To: Franchot Gallo, MD  Please review

## 2020-01-27 NOTE — Telephone Encounter (Signed)
Attempted to call pt to tell his PSA results. Left messages at both listed numbers.

## 2020-02-02 NOTE — Progress Notes (Signed)
Sent via mychart

## 2020-02-05 ENCOUNTER — Telehealth: Payer: Self-pay

## 2020-02-05 NOTE — Telephone Encounter (Signed)
-----   Message from Franchot Gallo, MD sent at 01/27/2020 12:21 PM EST ----- Notify pt--PSA up slightly to 1.2--will cont to follow--will call w/ upcoming XR results ----- Message ----- From: Dorisann Frames, RN Sent: 01/21/2020   8:37 AM EST To: Franchot Gallo, MD  Please review

## 2020-02-05 NOTE — Telephone Encounter (Signed)
Attempted to call pt again today. Left message.

## 2020-02-05 NOTE — Telephone Encounter (Signed)
Patients son returned call and made aware of psa results.

## 2020-02-13 ENCOUNTER — Ambulatory Visit (HOSPITAL_COMMUNITY)
Admission: RE | Admit: 2020-02-13 | Discharge: 2020-02-13 | Disposition: A | Discharge status: Home / Self Care | Payer: PPO | Source: Ambulatory Visit | Attending: Urology | Admitting: Urology

## 2020-02-13 ENCOUNTER — Encounter (HOSPITAL_COMMUNITY): Payer: Self-pay

## 2020-02-13 ENCOUNTER — Encounter (HOSPITAL_COMMUNITY)
Admission: RE | Admit: 2020-02-13 | Discharge: 2020-02-13 | Disposition: A | Discharge status: Home / Self Care | Payer: PPO | Source: Ambulatory Visit | Attending: Urology | Admitting: Urology

## 2020-02-13 ENCOUNTER — Other Ambulatory Visit: Payer: Self-pay

## 2020-02-13 DIAGNOSIS — M19042 Primary osteoarthritis, left hand: Secondary | ICD-10-CM | POA: Diagnosis not present

## 2020-02-13 DIAGNOSIS — M170 Bilateral primary osteoarthritis of knee: Secondary | ICD-10-CM | POA: Diagnosis not present

## 2020-02-13 DIAGNOSIS — N200 Calculus of kidney: Secondary | ICD-10-CM | POA: Diagnosis not present

## 2020-02-13 DIAGNOSIS — C61 Malignant neoplasm of prostate: Secondary | ICD-10-CM | POA: Insufficient documentation

## 2020-02-13 DIAGNOSIS — K449 Diaphragmatic hernia without obstruction or gangrene: Secondary | ICD-10-CM | POA: Diagnosis not present

## 2020-02-13 DIAGNOSIS — N281 Cyst of kidney, acquired: Secondary | ICD-10-CM | POA: Diagnosis not present

## 2020-02-13 DIAGNOSIS — M545 Low back pain, unspecified: Secondary | ICD-10-CM | POA: Diagnosis not present

## 2020-02-13 LAB — POCT I-STAT CREATININE: Creatinine, Ser: 2 mg/dL — ABNORMAL HIGH (ref 0.61–1.24)

## 2020-02-13 MED ORDER — IOHEXOL 300 MG/ML  SOLN
125.0000 mL | Freq: Once | INTRAMUSCULAR | Status: AC | PRN
  Administered 2020-02-13: 100 mL via INTRAVENOUS

## 2020-02-13 MED ORDER — TECHNETIUM TC 99M MEDRONATE IV KIT
20.0000 | PACK | Freq: Once | INTRAVENOUS | Status: AC | PRN
  Administered 2020-02-13: 20.4 via INTRAVENOUS

## 2020-02-16 DIAGNOSIS — R809 Proteinuria, unspecified: Secondary | ICD-10-CM | POA: Diagnosis not present

## 2020-02-16 DIAGNOSIS — E6609 Other obesity due to excess calories: Secondary | ICD-10-CM | POA: Diagnosis not present

## 2020-02-16 DIAGNOSIS — N1832 Chronic kidney disease, stage 3b: Secondary | ICD-10-CM | POA: Diagnosis not present

## 2020-02-16 DIAGNOSIS — I129 Hypertensive chronic kidney disease with stage 1 through stage 4 chronic kidney disease, or unspecified chronic kidney disease: Secondary | ICD-10-CM | POA: Diagnosis not present

## 2020-02-17 ENCOUNTER — Other Ambulatory Visit: Payer: Self-pay

## 2020-02-17 ENCOUNTER — Ambulatory Visit (INDEPENDENT_AMBULATORY_CARE_PROVIDER_SITE_OTHER): Payer: PPO

## 2020-02-17 DIAGNOSIS — C7951 Secondary malignant neoplasm of bone: Secondary | ICD-10-CM

## 2020-02-17 MED ORDER — DENOSUMAB 120 MG/1.7ML ~~LOC~~ SOLN
120.0000 mg | Freq: Once | SUBCUTANEOUS | Status: AC
Start: 2020-02-17 — End: 2020-02-17
  Administered 2020-02-17: 120 mg via SUBCUTANEOUS

## 2020-02-17 NOTE — Progress Notes (Signed)
SubQ Injection  Patient is present today for a SubQ Injection for treatment of Prostate Cancer Drug: xgeva Dose: 120 mg Location: RLQ Lot: [id number] Exp: 03/24  Patient tolerated well, no complications were noted  Performed by: Lakiesha Ralphs,lpn  Follow up/ Additional Notes: Keep next scheduled NV

## 2020-02-17 NOTE — Patient Instructions (Signed)
Patient Information Sheet : (Denosumab) Xgeva, Prolia)  What is this drug? Denosumab is a drug that maintains the strength of bones. It is FDA- approved treatment for patients with documented bone metastasis from solid tumors such as prostate cancer under the name of xgeva. It is also an FDA-approved treatment for men with prostate cancer at risk for complications related to bone loss under the name Prolia. Denosumab is not chemotherapy but is used with other treatments for advanced prostate cancer. It is administered by a subcutaneous injection.  Why am I taking this drug? You have been diagnosed with prostate cancer and are felt to be at an increased risk for bone loss related to your treatment or you have advanced prostate cancer that has spread to the bone increasing your risk for bone fractures. It is administered either every 6 months or every month depending on your risk factors for bone fracture.  How can this drug benefit me? Denosumab can reduce the risk of bone fractures and delay complications that occur when cancer has spread to the bones.  What should I do while taking Xgeva or Prolia? You need to have regular dental exams. You need to take calcium 1200mg each day as well as vitamin D 1000 IU per day. There are no restrictions on food, beverages or activity while receiving this medication.  What are the possible side effects of this medication? The most common side effects are fatigue, weakness, nausea and lowering of calcium levels. The most serious reaction os this drug is a serious allergic reaction which is rare. Other serious reactions that would require stopping the medication would be abnormally low blood calcium levels or osteonecrosis of the jaw.  Osteonecrosis of the jaw is a rare condition that involves the loss or breakdown of the jaw bow. Signs and symptoms include pain, swelling or infections of the gums, loosening of the tooth, poor healing of gums, numbness or a  feeling of heaviness in the jaw. You must notify your dentist and your urologist if you have any of these symptoms. Notify your urologist if you need any invasive dental treatments. Notify your dentist prior to starting this treatment. You must provide written clearance from your dentist prior to starting this drug and maintain regular dentist appointments throughout your treatment.   What is involved in receiving this drug? It is a subcutaneous injection (given under the skin) You will have a complete blood chemistry panel drawn prior to starting this drug and repeated every 6 months or as ordered by the doctor. This will check your calcium, magnesium and phosphorus levels.  

## 2020-02-20 NOTE — Progress Notes (Signed)
Sent via mychart

## 2020-02-25 ENCOUNTER — Other Ambulatory Visit: Payer: Self-pay | Admitting: Cardiovascular Disease

## 2020-02-25 DIAGNOSIS — R809 Proteinuria, unspecified: Secondary | ICD-10-CM | POA: Diagnosis not present

## 2020-02-25 DIAGNOSIS — N200 Calculus of kidney: Secondary | ICD-10-CM | POA: Diagnosis not present

## 2020-02-25 DIAGNOSIS — E6609 Other obesity due to excess calories: Secondary | ICD-10-CM | POA: Diagnosis not present

## 2020-02-25 DIAGNOSIS — I129 Hypertensive chronic kidney disease with stage 1 through stage 4 chronic kidney disease, or unspecified chronic kidney disease: Secondary | ICD-10-CM | POA: Diagnosis not present

## 2020-02-25 DIAGNOSIS — N1832 Chronic kidney disease, stage 3b: Secondary | ICD-10-CM | POA: Diagnosis not present

## 2020-03-07 ENCOUNTER — Other Ambulatory Visit: Payer: Self-pay | Admitting: Cardiovascular Disease

## 2020-03-16 ENCOUNTER — Ambulatory Visit (INDEPENDENT_AMBULATORY_CARE_PROVIDER_SITE_OTHER): Payer: PPO

## 2020-03-16 ENCOUNTER — Other Ambulatory Visit: Payer: Self-pay

## 2020-03-16 DIAGNOSIS — C7951 Secondary malignant neoplasm of bone: Secondary | ICD-10-CM

## 2020-03-16 MED ORDER — DENOSUMAB 120 MG/1.7ML ~~LOC~~ SOLN
120.0000 mg | Freq: Once | SUBCUTANEOUS | Status: AC
  Administered 2020-03-16: 120 mg via SUBCUTANEOUS

## 2020-03-16 NOTE — Progress Notes (Signed)
SubQ Injection  Patient is present today for a SubQ Injection for treatment of Prostate Cancer Drug: Xgeva Dose: 120 mg Location: LUQ Lot: [id number] Exp: 03/24  Patient tolerated well, no complications were noted  Performed by: Olegario Emberson, lpn  Follow up/ Additional Notes: Keep next scheduled appointment

## 2020-03-16 NOTE — Patient Instructions (Signed)
Patient Information Sheet : (Denosumab) Xgeva, Prolia)  What is this drug? Denosumab is a drug that maintains the strength of bones. It is FDA- approved treatment for patients with documented bone metastasis from solid tumors such as prostate cancer under the name of xgeva. It is also an FDA-approved treatment for men with prostate cancer at risk for complications related to bone loss under the name Prolia. Denosumab is not chemotherapy but is used with other treatments for advanced prostate cancer. It is administered by a subcutaneous injection.  Why am I taking this drug? You have been diagnosed with prostate cancer and are felt to be at an increased risk for bone loss related to your treatment or you have advanced prostate cancer that has spread to the bone increasing your risk for bone fractures. It is administered either every 6 months or every month depending on your risk factors for bone fracture.  How can this drug benefit me? Denosumab can reduce the risk of bone fractures and delay complications that occur when cancer has spread to the bones.  What should I do while taking Xgeva or Prolia? You need to have regular dental exams. You need to take calcium 1200mg each day as well as vitamin D 1000 IU per day. There are no restrictions on food, beverages or activity while receiving this medication.  What are the possible side effects of this medication? The most common side effects are fatigue, weakness, nausea and lowering of calcium levels. The most serious reaction os this drug is a serious allergic reaction which is rare. Other serious reactions that would require stopping the medication would be abnormally low blood calcium levels or osteonecrosis of the jaw.  Osteonecrosis of the jaw is a rare condition that involves the loss or breakdown of the jaw bow. Signs and symptoms include pain, swelling or infections of the gums, loosening of the tooth, poor healing of gums, numbness or a  feeling of heaviness in the jaw. You must notify your dentist and your urologist if you have any of these symptoms. Notify your urologist if you need any invasive dental treatments. Notify your dentist prior to starting this treatment. You must provide written clearance from your dentist prior to starting this drug and maintain regular dentist appointments throughout your treatment.   What is involved in receiving this drug? It is a subcutaneous injection (given under the skin) You will have a complete blood chemistry panel drawn prior to starting this drug and repeated every 6 months or as ordered by the doctor. This will check your calcium, magnesium and phosphorus levels.  

## 2020-03-25 ENCOUNTER — Other Ambulatory Visit: Payer: Self-pay

## 2020-03-25 DIAGNOSIS — C61 Malignant neoplasm of prostate: Secondary | ICD-10-CM

## 2020-04-12 NOTE — Progress Notes (Incomplete)
History of Present Illness: Here for followup of PCa.   Initial diagnosis 2002--PSA 12, apparently Gleason score 5 (2+3). 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  Underwent EBRT--35 treatments.   CT abdomen and pelvis on 11.20.2017, performed for gross hematuria. This revealed an absent prostate, no evidence for recurrent tumor or metastatic disease within the abdomen or pelvis. Bilateral renal calculi noted. Sclerotic lesion involving L4 vertebra favored to represent a bone island.   2.12.2019: Most recent PSA is up to 5.7. He denies bony pain. He denies significant weight loss.  4.8.2019: Bone scan--abnml uptake in L4 vertebral bodyc/w met PCa  5.7.2019: CT (03/12/2017, 05/07/2017) revealed larger L4 sclerotic met w/ several new foci--L3, T12, left 8th rib, right ilium. No complaints of bony pain.   5.7.2019: He was started on ADT with Firmagon   6.25.2019: first Lupron injection.   10.29.2019: Receiving Xgeva on a monthly basis.  Most recent PSA from June of this year, following initiation of androgen deprivation therapy was down to 0.2.   3.3.2020: Most recent PSA from October 27, 2017 was less than 0.1. He apparently had labs drawn last week, but the only lab returned was a basic metabolic panel. He has had hot flashes. He denies blood in his urine or stool. He does have urgency and occasional urgency incontinence. No dysuria. He has a good stream. He has limited mobility.   11.3.2020: PSA --<0.1. T level castrate. Lupron given.  1.11.2022:    Past Medical History:  Diagnosis Date  . Cardiomyopathy [facility])    h/o felt 2nd atrial fib, EF normal by echo 2013  . CHF (congestive heart failure) ([place])   . Chronic anticoagulation   . Chronic atrial fibrillation ([place])   . Chronic kidney disease (CKD), stage IV (severe) ([place])   . Dysrhythmia   . Hypothyroidism   . Pneumonia   . Pre-diabetes   . Primary localized  osteoarthritis of left hip 05/02/2016  . Prostate cancer ([place])   . Shortness of breath   . Systemic hypertension     Past Surgical History:  Procedure Laterality Date  . CARDIOVERSION    . CATARACT EXTRACTION W/PHACO  04/17/2011   Procedure: CATARACT EXTRACTION PHACO AND INTRAOCULAR LENS PLACEMENT (IOC);  Surgeon: [name], MD;  Location: [facility] ORS;  Service: Ophthalmology;  Laterality: Left;  CDE:14.58  . CATARACT EXTRACTION W/PHACO  05/22/2011   Procedure: CATARACT EXTRACTION PHACO AND INTRAOCULAR LENS PLACEMENT (IOC);  Surgeon: [name], MD;  Location: [facility] ORS;  Service: Ophthalmology;  Laterality: Right;  CDE 13.02  . COLONOSCOPY  01/17/2011   Procedure: COLONOSCOPY;  Surgeon: Jamesetta So;  Location: [facility] END[facility] SU[facility];  Service: Gastroenterology;  Laterality: N/A;  . COLONOSCOPY N/A 02/24/2016   Procedure: COLONOSCOPY;  Surgeon: Rogene Houston, MD;  Location: [facility] SU[facility];  Service: Endoscopy;  Laterality: N/A;  12:50  . INGUINAL HERNIA REPAIR Right 09/27/2015   Procedure: RIGHT INGUINAL HERNIORRHAPY WITH MESH SMALL BOWEL RESECTION;  Surgeon: Aviva Signs, MD;  Location: [facility] ORS;  Service: General;  Laterality: Right;  . NM MYOCAR [place]  02/2009   dipyridamole; mild perfusion defect due to infarct/scar with mild perinfarct ischemia is apical septal, apical basal inferoseptal, basal inferior, mid inferoseptal mid inferior & apical inferior; EKG negative for ischemia; low risk scan  . PROSTATE SURGERY     7 years ago [facility] Dr Javaid-prostatectomy  . PROSTATECTOMY    . Sleep Study  05/2011  increased upper airway resistance syndrome with mild sleep apnea during REM  . TOTAL HIP ARTHROPLASTY Left 05/02/2016   Procedure: LEFT TOTAL HIP ARTHROPLASTY;  Surgeon: Marchia Bond, MD;  Location: [place];  Service: Orthopedics;  Laterality: Left;  . TRANSTHORACIC ECHOCARDIOGRAM  04/2011   EF=50-55%; RV mildly dilated; LA mild-mod dilated; mild mitral valve annular calcif, mild MR; mod TR, RVSP  elevated 30-65mHg, mild pulm HTN; AV mildly sclerotic, mild calcif of AV leaflets; trace pulm valve regurg     Home Medications:  Allergies as of 04/13/2020      Reactions   No Known Allergies       Medication List       Accurate as of April 12, 2020  6:46 PM. If you have any questions, ask your nurse or doctor.        Calcium 600+D3 600-800 MG-UNIT Tabs Generic drug: Calcium Carb-Cholecalciferol Take 1 tablet by mouth 2 (two) times daily.   Euthyrox 100 MCG tablet Generic drug: levothyroxine TAKE 1 TABLET BY MOUTH ONCE DAILY BEFORE BREAKFAST   ferrous sulfate 325 (65 FE) MG tablet Take 325 mg by mouth daily with breakfast.   metoprolol tartrate 50 MG tablet Commonly known as: LOPRESSOR Take 1 tablet by mouth twice daily   sodium bicarbonate 650 MG tablet Take 650 mg by mouth 2 (two) times daily.   Xarelto 15 MG Tabs tablet Generic drug: Rivaroxaban TAKE 1 TABLET BY MOUTH ONCE DAILY WITH SUPPER       Allergies:  Allergies  Allergen Reactions  . No Known Allergies     Family History  Problem Relation Age of Onset  . Diabetes Mother   . Prostate cancer Father   . Anesthesia problems Neg Hx   . Hypotension Neg Hx   . Malignant hyperthermia Neg Hx   . Pseudochol deficiency Neg Hx     Social History:  reports that he has never smoked. He has never used smokeless tobacco. He reports that he does not drink alcohol and does not use drugs.  ROS: A complete review of systems was performed.  All systems are negative except for pertinent findings as noted.  Physical Exam:  Vital signs in last 24 hours: There were no vitals taken for this visit. Constitutional:  Alert and oriented, No acute distress Cardiovascular: Regular rate  Respiratory: Normal respiratory effort GI: Abdomen is soft, nontender, nondistended, no abdominal masses. No CVAT.  Genitourinary: Normal male phallus, testes are descended bilaterally and non-tender and without masses, scrotum is normal  in appearance without lesions or masses, perineum is normal on inspection. Lymphatic: No lymphadenopathy Neurologic: Grossly intact, no focal deficits Psychiatric: Normal mood and affect  Laboratory Data:  No results for input(s): WBC, HGB, HCT, PLT in the last 72 hours.  No results for input(s): NA, K, CL, GLUCOSE, BUN, CALCIUM, CREATININE in the last 72 hours.  Invalid input(s): CO3   No results found for this or any previous visit (from the past 24 hour(s)). No results found for this or any previous visit (from the past 240 hour(s)).  Renal Function: No results for input(s): CREATININE in the last 168 hours. CrCl cannot be calculated (Patient's most recent lab result is older than the maximum 21 days allowed.).  Radiologic Imaging: No results found.  Impression/Assessment:  ***  Plan:  ***

## 2020-04-13 ENCOUNTER — Other Ambulatory Visit: Payer: Self-pay

## 2020-04-13 ENCOUNTER — Ambulatory Visit (INDEPENDENT_AMBULATORY_CARE_PROVIDER_SITE_OTHER): Payer: PPO

## 2020-04-13 DIAGNOSIS — C7951 Secondary malignant neoplasm of bone: Secondary | ICD-10-CM | POA: Diagnosis not present

## 2020-04-13 MED ORDER — DENOSUMAB 120 MG/1.7ML ~~LOC~~ SOLN
120.0000 mg | Freq: Once | SUBCUTANEOUS | Status: AC
Start: 2020-04-13 — End: 2020-04-13
  Administered 2020-04-13: 120 mg via SUBCUTANEOUS

## 2020-04-13 NOTE — Progress Notes (Signed)
SubQ Injection  Patient is present today for a SubQ Injection for treatment of  Drug:Xgeva Dose: 120 mg Location: RUQ Lot: [id number] Exp: 03/24  Patient tolerated well, no complications were noted  Preformed by: Tammee Thielke, Lpn  Follow up/ Additional Notes: Keep next scheduled OV

## 2020-04-13 NOTE — Patient Instructions (Signed)
Patient Information Sheet : (Denosumab) Xgeva, Prolia)  What is this drug? Denosumab is a drug that maintains the strength of bones. It is FDA- approved treatment for patients with documented bone metastasis from solid tumors such as prostate cancer under the name of xgeva. It is also an FDA-approved treatment for men with prostate cancer at risk for complications related to bone loss under the name Prolia. Denosumab is not chemotherapy but is used with other treatments for advanced prostate cancer. It is administered by a subcutaneous injection.  Why am I taking this drug? You have been diagnosed with prostate cancer and are felt to be at an increased risk for bone loss related to your treatment or you have advanced prostate cancer that has spread to the bone increasing your risk for bone fractures. It is administered either every 6 months or every month depending on your risk factors for bone fracture.  How can this drug benefit me? Denosumab can reduce the risk of bone fractures and delay complications that occur when cancer has spread to the bones.  What should I do while taking Xgeva or Prolia? You need to have regular dental exams. You need to take calcium 1200mg each day as well as vitamin D 1000 IU per day. There are no restrictions on food, beverages or activity while receiving this medication.  What are the possible side effects of this medication? The most common side effects are fatigue, weakness, nausea and lowering of calcium levels. The most serious reaction os this drug is a serious allergic reaction which is rare. Other serious reactions that would require stopping the medication would be abnormally low blood calcium levels or osteonecrosis of the jaw.  Osteonecrosis of the jaw is a rare condition that involves the loss or breakdown of the jaw bow. Signs and symptoms include pain, swelling or infections of the gums, loosening of the tooth, poor healing of gums, numbness or a  feeling of heaviness in the jaw. You must notify your dentist and your urologist if you have any of these symptoms. Notify your urologist if you need any invasive dental treatments. Notify your dentist prior to starting this treatment. You must provide written clearance from your dentist prior to starting this drug and maintain regular dentist appointments throughout your treatment.   What is involved in receiving this drug? It is a subcutaneous injection (given under the skin) You will have a complete blood chemistry panel drawn prior to starting this drug and repeated every 6 months or as ordered by the doctor. This will check your calcium, magnesium and phosphorus levels.  

## 2020-04-15 ENCOUNTER — Other Ambulatory Visit: Payer: Self-pay | Admitting: "Endocrinology

## 2020-04-27 ENCOUNTER — Ambulatory Visit: Payer: PPO | Admitting: Urology

## 2020-04-30 ENCOUNTER — Encounter: Payer: Self-pay | Admitting: Cardiovascular Disease

## 2020-04-30 ENCOUNTER — Other Ambulatory Visit: Payer: Self-pay

## 2020-04-30 ENCOUNTER — Ambulatory Visit: Payer: PPO | Admitting: Cardiovascular Disease

## 2020-04-30 VITALS — BP 130/74 | HR 108 | Ht 74.0 in | Wt 274.4 lb

## 2020-04-30 DIAGNOSIS — I4821 Permanent atrial fibrillation: Secondary | ICD-10-CM | POA: Diagnosis not present

## 2020-04-30 DIAGNOSIS — N1832 Chronic kidney disease, stage 3b: Secondary | ICD-10-CM

## 2020-04-30 DIAGNOSIS — E039 Hypothyroidism, unspecified: Secondary | ICD-10-CM

## 2020-04-30 DIAGNOSIS — Z7901 Long term (current) use of anticoagulants: Secondary | ICD-10-CM

## 2020-04-30 DIAGNOSIS — I951 Orthostatic hypotension: Secondary | ICD-10-CM

## 2020-04-30 DIAGNOSIS — E785 Hyperlipidemia, unspecified: Secondary | ICD-10-CM | POA: Diagnosis not present

## 2020-04-30 MED ORDER — METOPROLOL TARTRATE 100 MG PO TABS: 100.0000 mg | ORAL_TABLET | Freq: Two times a day (BID) | ORAL | 3 refills | Status: DC

## 2020-04-30 NOTE — Progress Notes (Signed)
Cardiology Office Note    Date:  04/30/2020   ID:  [name] [name], DOB 03-07-1940, MRN [id number]  PCP:  [name] Sites, MD  Cardiologist:   [name] Klein, MD   No chief complaint on file.   History of Present Illness:  [name] [name] is a 80 y.o. 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  He is here accompanied by his son (both his son and his daughter-in-law are also my patients).  When he presented at his last appointment he had significant RVR and we increased the dose of metoprolol to 75 mg twice daily.  His heart rate is still little fast today at over 100 bpm, but the record provided by his son shows that the typical resting heart rate at home is in the mid 90s.  His heart rate does increase with activity, promptly.  The patient specifically denies any chest pain at rest exertion, dyspnea at rest or with exertion, orthopnea, paroxysmal nocturnal dyspnea, syncope, palpitations, focal neurological deficits, intermittent claudication, lower extremity edema, unexplained weight gain, cough, hemoptysis or wheezing.  His nephrologist is [name]. Lowanda [name] and his endocrinologist is [name]. Dorris [name].  His hemoglobin A1c remains excellent at 5.9% and his most recent creatinine is stable at 1.96 (12/22/2019).  However, his LDL remains elevated at 122 (borderline low HDL at 40).  A nuclear stress test in 2011 showed reported scar with mild peri-infarct ischemia in the apex and the inferior wall. It was felt to be a low risk scan since no significant ischemia was demonstrated. In August 2017 left ventricular ejection fraction by echo was 50-55% without regional wall motion abnormalities. Both atria were mildly dilated. No significant valvular abnormalities were reported. Sleep study in 2013 showed upper airway resistance syndrome  without frank sleep apnea. 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  Past Medical History:  Diagnosis Date  . Cardiomyopathy [facility])    h/o felt 2nd atrial fib, EF normal by echo 2013  . CHF (congestive heart failure) ([place])   . Chronic anticoagulation   . Chronic atrial fibrillation ([place])   . Chronic kidney disease (CKD), stage IV (severe) ([place])   . Dysrhythmia   . Hypothyroidism   . Pneumonia   . Pre-diabetes   . Primary localized osteoarthritis of left hip 05/02/2016  . Prostate cancer ([place])   . Shortness of breath   . Systemic hypertension     Past Surgical History:  Procedure Laterality Date  . CARDIOVERSION    . CATARACT EXTRACTION W/PHACO  04/17/2011   Procedure: CATARACT EXTRACTION PHACO AND INTRAOCULAR LENS PLACEMENT (IOC);  Surgeon: [name] Branch, MD;  Location: [facility];  Service: Ophthalmology;  Laterality: Left;  CDE:14.58  . CATARACT EXTRACTION W/PHACO  05/22/2011   Procedure: CATARACT EXTRACTION PHACO AND INTRAOCULAR LENS PLACEMENT (IOC);  Surgeon: [name] Branch, MD;  Location: [facility];  Service: Ophthalmology;  Laterality: Right;  CDE 13.02  . COLONOSCOPY  01/17/2011   Procedure: COLONOSCOPY;  Surgeon: [name] [name];  Location: [facility];  Service: Gastroenterology;  Laterality: N/A;  . COLONOSCOPY N/A 02/24/2016   Procedure: COLONOSCOPY;  Surgeon: [name] Houston, MD;  Location[facility];  Service: Endoscopy;  Laterality: N/A;  12:50  . INGUINAL HERNIA REPAIR Right 09/27/2015   Procedure: RIGHT INGUINAL HERNIORRHAPY WITH MESH SMALL BOWEL RESECTION;  Surgeon: [name] Signs, MD;  Location: [facility];  Service: General;  Laterality: Right;  . NM [place]  02/2009   dipyridamole; mild perfusion defect due to infarct/scar with mild perinfarct ischemia is apical septal, apical basal inferoseptal, basal inferior, mid inferoseptal mid inferior & apical inferior; EKG negative for ischemia; low risk  scan  . PROSTATE SURGERY     7 years ago [facility] [name] [name]-prostatectomy  . PROSTATECTOMY    . Sleep Study  05/2011   increased upper airway resistance syndrome with mild sleep apnea during REM  . TOTAL HIP ARTHROPLASTY Left 05/02/2016   Procedure: LEFT TOTAL HIP ARTHROPLASTY;  Surgeon: [name] Bond, MD;  Location: [place];  Service: Orthopedics;  Laterality: Left;  . TRANSTHORACIC ECHOCARDIOGRAM  04/2011   EF=50-55%; RV mildly dilated; LA mild-mod dilated; mild mitral valve annular calcif, mild MR; mod TR, RVSP elevated 30-28mmHg, mild pulm HTN; AV mildly sclerotic, mild calcif of AV leaflets; trace pulm valve regurg     Current Medications: Outpatient Medications Prior to Visit  Medication Sig Dispense Refill  . Calcium Carb-Cholecalciferol (CALCIUM 600+D3) 600-800 MG-UNIT TABS Take 1 tablet by mouth 2 (two) times daily. 90 tablet 1  . EUTHYROX 100 MCG tablet TAKE 1 TABLET BY MOUTH ONCE DAILY BEFORE BREAKFAST 90 tablet 0  . ferrous sulfate 325 (65 FE) MG tablet Take 325 mg by mouth daily with breakfast.    . sodium bicarbonate 650 MG tablet Take 650 mg by mouth 2 (two) times daily.    Alveda Reasons 15 MG TABS tablet TAKE 1 TABLET BY MOUTH ONCE DAILY WITH SUPPER 90 tablet 1  . metoprolol tartrate (LOPRESSOR) 50 MG tablet Take 1 tablet by mouth twice daily (Patient taking differently: 75 mg.) 180 tablet 1   Facility-Administered Medications Prior to Visit  Medication Dose Route Frequency Provider Last Rate Last Admin  . neomycin-polymyxin-dexameth (MAXITROL) 0.1 % ophth ointment    PRN [name] Branch, MD   1 application at 32/67/12 4580     Allergies:   No known allergies   Social History   Socioeconomic History  . Marital status: Single    Spouse name: Not on file  . Number of children: 2  . Years of education: Not on file  . Highest education level: Not on file  Occupational History  . Not on file  Tobacco Use  . Smoking status: Never Smoker  . Smokeless tobacco: Never Used  Vaping Use   . Vaping Use: Never used  Substance and Sexual Activity  . Alcohol use: No  . Drug use: No  . Sexual activity: Yes    Birth control/protection: None  Other Topics Concern  . Not on file  Social History Narrative  . Not on file   Social Determinants of Health   Financial Resource Strain: Not on file  Food Insecurity: Not on file  Transportation Needs: Not on file  Physical Activity: Not on file  Stress: Not on file  Social Connections: Not on file     Family History:  The patient's  family history includes Diabetes in his mother; Prostate cancer in his father.   ROS:   Please see the history of present illness.   All other systems are reviewed and are negative.   PHYSICAL EXAM:   VS:  BP 130/74   Pulse (!) 108   Ht 6\' 2"  (1.88 m)   Wt 274 lb 6.4 oz (124.5 kg)   SpO2 96%   BMI 35.23 kg/m       General:  Alert, oriented x3, no distress, obese Head: no evidence of trauma, PERRL, EOMI, no exophtalmos or lid lag, no myxedema, no xanthelasma; normal ears, nose and oropharynx Neck: normal jugular venous pulsations and no hepatojugular reflux; brisk carotid pulses without delay and no carotid bruits Chest: clear to auscultation, no [name] of consolidation by percussion or palpation, normal fremitus, symmetrical and full respiratory excursions Cardiovascular: normal position and quality of the apical impulse, irregular rhythm, normal first and second heart sounds, no murmurs, rubs or gallops Abdomen: no tenderness or distention, no masses by palpation, no abnormal pulsatility or arterial bruits, normal bowel sounds, no hepatosplenomegaly Extremities: no clubbing, cyanosis or edema; 2+ radial, ulnar and brachial pulses bilaterally; 2+ right femoral, posterior tibial and dorsalis pedis pulses; 2+ left femoral, posterior tibial and dorsalis pedis pulses; no subclavian or femoral bruits Neurological: grossly nonfocal Psych: Normal mood and affect   Wt Readings from Last 3  Encounters:  04/30/20 274 lb 6.4 oz (124.5 kg)  01/21/20 274 lb (124.3 kg)  01/20/20 286 lb (129.7 kg)      Studies/Labs Reviewed:   EKG:  EKG is ordered today.  It shows atrial fibrillation with rapid ventricular response, left axis deviation/anterior fascicular block. QTc 402 ms.  Recent Labs: 12/22/2019: ALT 24; BUN 24; Potassium 4.9; Sodium 141; TSH 2.640 02/13/2020: Creatinine, Ser 2.00   Lipid Panel    Component Value Date/Time   CHOL 196 12/22/2019 0950   TRIG 190 (H) 12/22/2019 0950   HDL 40 12/22/2019 0950   CHOLHDL 4.3 02/04/2009 0538   VLDL 24 02/04/2009 0538   LDLCALC 122 (H) 12/22/2019 0950    ASSESSMENT:    1. Permanent atrial fibrillation ([place])   2. Orthostatic syncope   3. Long term current use of anticoagulant therapy   4. Acquired hypothyroidism   5. Stage 3b chronic kidney disease ([place])   6. Dyslipidemia (high LDL; low HDL)      PLAN:  In order of problems listed above:  1. AFib: Rate control has improved but remains suboptimal.  Increase the metoprolol 200 mg twice daily.  CHADSVasc 4 (age 89, HTN, HF).  On anticoagulation, Xarelto dose adjusted for renal function. 2. history of orthostatic syncope: This has not been a problem since we increased the metoprolol dose, should tolerate the even higher dose. 3. Xarelto: No bleeding complications.  Dose of Xarelto appropriately adjusted for renal function.  No history of stroke/TIA.  Has known hemorrhoids and diverticulosis.  no recent bleeding events.  Option to switch to Eliquis if he has further GI bleeding. 4. Hypothyroidism: Recent TSH shows therapeutic dose of levothyroxine.  Clinically euthyroid 5. CKD stage 3-4: Stable creatinine at about 1.8-2.2 (GFR 30-35).  Most recent creatinine 1.96. 6. HLP: Dyslipidemia pattern with a relatively high LDL and relatively low HDL.  His LDL is close to the range where we would recommend pharmacological therapy, but he does not have known CAD or PAD.Marland Kitchen  He does not have  full-blown diabetes but does have prediabetes.  Triglycerides are also high.  All these values would improve with weight loss.    Medication Adjustments/Labs and Tests Ordered: Current medicines are reviewed at length with the patient today.  Concerns regarding medicines are outlined above.  Medication changes, Labs and Tests ordered today are listed in the Patient Instructions below. Patient Instructions  Medication Instructions:  Increase metoprolol tartrate to 100mg  twice daily.   *If you need a refill on your cardiac medications before your next appointment, please call your pharmacy*  Lab Work: None ordered  If you have labs (blood work) drawn today and your tests are completely normal, you will receive your results only by: Marland Kitchen MyChart Message (if you have MyChart) OR . A paper copy in the mail If you have any lab test that is abnormal or we need to change your treatment, we will call you to review the results.   Testing/Procedures: None ordered.    Follow-Up: At [facility], you and your health needs are our priority.  As part of our continuing mission to provide you with exceptional heart care, we have created designated Provider Care Teams.  These Care Teams include your primary Cardiologist (physician) and Advanced Practice Providers (APPs -  Physician Assistants and Nurse Practitioners) who all work together to provide you with the care you need, when you need it.  We recommend signing up for the patient portal called "MyChart".  Sign up information is provided on this After Visit Summary.  MyChart is used to connect with patients for Virtual Visits (Telemedicine).  Patients are able to view lab/test results, encounter notes, upcoming appointments, etc.  Non-urgent messages can be sent to your provider as well.   To learn more about what you can do with MyChart, go to [email or web address].    Your next appointment:   12 month(s)  The format for your next  appointment:   In Person  Provider:   [name], MD        Signed, [name] Klein, MD  04/30/2020 1:49 PM    [facility] [address], [place], [place]  [address] Phone: [phone]; Fax: [phone]

## 2020-04-30 NOTE — Patient Instructions (Signed)
Medication Instructions:  Increase metoprolol tartrate to 100mg  twice daily.   *If you need a refill on your cardiac medications before your next appointment, please call your pharmacy*   Lab Work: None ordered  If you have labs (blood work) drawn today and your tests are completely normal, you will receive your results only by: Marland Kitchen MyChart Message (if you have MyChart) OR . A paper copy in the mail If you have any lab test that is abnormal or we need to change your treatment, we will call you to review the results.   Testing/Procedures: None ordered.    Follow-Up: At [facility], you and your health needs are our priority.  As part of our continuing mission to provide you with exceptional heart care, we have created designated Provider Care Teams.  These Care Teams include your primary Cardiologist (physician) and Advanced Practice Providers (APPs -  Physician Assistants and Nurse Practitioners) who all work together to provide you with the care you need, when you need it.  We recommend signing up for the patient portal called "MyChart".  Sign up information is provided on this After Visit Summary.  MyChart is used to connect with patients for Virtual Visits (Telemedicine).  Patients are able to view lab/test results, encounter notes, upcoming appointments, etc.  Non-urgent messages can be sent to your provider as well.   To learn more about what you can do with MyChart, go to [email or web address].    Your next appointment:   12 month(s)  The format for your next appointment:   In Person  Provider:   [name], MD

## 2020-05-17 DIAGNOSIS — I129 Hypertensive chronic kidney disease with stage 1 through stage 4 chronic kidney disease, or unspecified chronic kidney disease: Secondary | ICD-10-CM | POA: Diagnosis not present

## 2020-05-17 DIAGNOSIS — N1832 Chronic kidney disease, stage 3b: Secondary | ICD-10-CM | POA: Diagnosis not present

## 2020-05-17 DIAGNOSIS — R809 Proteinuria, unspecified: Secondary | ICD-10-CM | POA: Diagnosis not present

## 2020-05-17 DIAGNOSIS — E6609 Other obesity due to excess calories: Secondary | ICD-10-CM | POA: Diagnosis not present

## 2020-05-18 ENCOUNTER — Other Ambulatory Visit: Payer: PPO

## 2020-05-18 ENCOUNTER — Other Ambulatory Visit: Payer: Self-pay

## 2020-05-18 DIAGNOSIS — C61 Malignant neoplasm of prostate: Secondary | ICD-10-CM

## 2020-05-19 LAB — TESTOSTERONE: Testosterone: 19 ng/dL — ABNORMAL LOW (ref 264–916)

## 2020-05-19 LAB — PSA: Prostate Specific Ag, Serum: 1.3 ng/mL (ref 0.0–4.0)

## 2020-05-24 NOTE — Progress Notes (Signed)
History of Present Illness: Here for PCA followup.  Initial diagnosis 2002--PSA 12, apparently Gleason score 5 (2+3). 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  Underwent EBRT--35 treatments.   CT abdomen and pelvis on 11.20.2017, performed for gross hematuria. This revealed an absent prostate, no evidence for recurrent tumor or metastatic disease within the abdomen or pelvis. Bilateral renal calculi noted. Sclerotic lesion involving L4 vertebra favored to represent a bone island.   2.12.2019: Most recent PSA is up to 5.7. He denies bony pain. He denies significant weight loss.  4.8.2019: Bone scan--abnml uptake in L4 vertebral bodyc/w met PCa  5.7.2019: CT (03/12/2017, 05/07/2017) revealed larger L4 sclerotic met w/ several new foci--L3, T12, left 8th rib, right ilium. No complaints of bony pain.   5.7.2019: He was started on ADT with Firmagon   6.25.2019: first Lupron injection.   10.29.2019: Receiving Xgeva on a monthly basis. He has not had recent laboratories drawn. He is having no blood in his urine or stool. He states that he has a good urinary stream. He has had some recent back pain, but he spent about an hour and half on a lawnmower recently. He is having hot flashes. Most recent PSA from June of this year, following initiation of androgen deprivation therapy was down to 0.2.   3.3.2020: Most recent PSA from October 27, 2017 was less than 0.1. He apparently had labs drawn last week, but the only lab returned was a basic metabolic panel. He has had hot flashes. He denies blood in his urine or stool. He does have urgency and occasional urgency incontinence. No dysuria. He has a good stream. He has limited mobility.   11.3.2020: PSA 6.2020--<0.1. T level castrate. Here today for lupron. He has been having hot flashes but states that these are tolerable given the weather has gotten a lot colder. He is due a repeat PSA. He denies  any dysuria or hematuria. He denies any new urinary complaints. He states that his boney pain has since resolved.  8.3.2020: Pt denies any LUTS and hematuria or loss of appetite or weight. Pt submits no questions or concerns at this time regarding his health. Pt has multiple questions about billing for his ADT.  9.7.2021:Pt states that he would like to continueLuproninstead of proceeding with surgical castration. Pt reports that he is feeling well and has no new aches or pains. He brings in billing info w/ him.  1.11.2022: Pt denies any new aches or pains, but notes frequent hot-flashes. He denies having any new concerns at this time and has remained relatively stable between visits.  3.16.2021:Here today for 4 mo lupron and 1 mo xgeva. No recent labs. He reports that, since last seen for OV, he has been doing quite well with no significant urinary complaints.   5.17.2022: PSA stable at 1.3.  Bone scan and CT scans after last visit both stable.  No gross hematuria, no dysuria, no blood in stool.   Past Medical History:  Diagnosis Date  . Cardiomyopathy [facility])    h/o felt 2nd atrial fib, EF normal by echo 2013  . CHF (congestive heart failure) ([place])   . Chronic anticoagulation   . Chronic atrial fibrillation ([place])   . Chronic kidney disease (CKD), stage IV (severe) ([place])   . Dysrhythmia   . Hypothyroidism   . Pneumonia   . Pre-diabetes   . Primary localized osteoarthritis of left hip 05/02/2016  . Prostate cancer ([place])   .  Shortness of breath   . Systemic hypertension     Past Surgical History:  Procedure Laterality Date  . CARDIOVERSION    . CATARACT EXTRACTION W/PHACO  04/17/2011   Procedure: CATARACT EXTRACTION PHACO AND INTRAOCULAR LENS PLACEMENT (IOC);  Surgeon: [name], MD;  Location: [facility];  Service: Ophthalmology;  Laterality: Left;  CDE:14.58  . CATARACT EXTRACTION W/PHACO  05/22/2011   Procedure: CATARACT EXTRACTION PHACO AND INTRAOCULAR LENS PLACEMENT (IOC);   Surgeon: [name], MD;  Location: [facility];  Service: Ophthalmology;  Laterality: Right;  CDE 13.02  . COLONOSCOPY  01/17/2011   Procedure: COLONOSCOPY;  Surgeon: Jamesetta So;  [facility];  Service: Gastroenterology;  Laterality: N/A;  . COLONOSCOPY N/A 02/24/2016   Procedure: COLONOSCOPY;  Surgeon: Rogene Houston, MD;  [facility];  Service: Endoscopy;  Laterality: N/A;  12:50  . INGUINAL HERNIA REPAIR Right 09/27/2015   Procedure: RIGHT INGUINAL HERNIORRHAPY WITH MESH SMALL BOWEL RESECTION;  Surgeon: Aviva Signs, MD;  Location: [facility];  Service: General;  Laterality: Right;  . NM MYOCAR [place]  02/2009   dipyridamole; mild perfusion defect due to infarct/scar with mild perinfarct ischemia is apical septal, apical basal inferoseptal, basal inferior, mid inferoseptal mid inferior & apical inferior; EKG negative for ischemia; low risk scan  . PROSTATE SURGERY     7 years ago [facility] Dr Javaid-prostatectomy  . PROSTATECTOMY    . Sleep Study  05/2011   increased upper airway resistance syndrome with mild sleep apnea during REM  . TOTAL HIP ARTHROPLASTY Left 05/02/2016   Procedure: LEFT TOTAL HIP ARTHROPLASTY;  Surgeon: Marchia Bond, MD;  Location: [place];  Service: Orthopedics;  Laterality: Left;  . TRANSTHORACIC ECHOCARDIOGRAM  04/2011   EF=50-55%; RV mildly dilated; LA mild-mod dilated; mild mitral valve annular calcif, mild MR; mod TR, RVSP elevated 30-43mHg, mild pulm HTN; AV mildly sclerotic, mild calcif of AV leaflets; trace pulm valve regurg     Home Medications:  Allergies as of 05/25/2020      Reactions   No Known Allergies       Medication List       Accurate as of May 24, 2020  7:51 PM. If you have any questions, ask your nurse or doctor.        Calcium 600+D3 600-800 MG-UNIT Tabs Generic drug: Calcium Carb-Cholecalciferol Take 1 tablet by mouth 2 (two) times daily.   Euthyrox 100 MCG tablet Generic drug: levothyroxine TAKE 1 TABLET BY MOUTH  ONCE DAILY BEFORE BREAKFAST   ferrous sulfate 325 (65 FE) MG tablet Take 325 mg by mouth daily with breakfast.   metoprolol tartrate 100 MG tablet Commonly known as: LOPRESSOR Take 1 tablet (100 mg total) by mouth 2 (two) times daily.   sodium bicarbonate 650 MG tablet Take 650 mg by mouth 2 (two) times daily.   Xarelto 15 MG Tabs tablet Generic drug: Rivaroxaban TAKE 1 TABLET BY MOUTH ONCE DAILY WITH SUPPER       Allergies:  Allergies  Allergen Reactions  . No Known Allergies     Family History  Problem Relation Age of Onset  . Diabetes Mother   . Prostate cancer Father   . Anesthesia problems Neg Hx   . Hypotension Neg Hx   . Malignant hyperthermia Neg Hx   . Pseudochol deficiency Neg Hx     Social History:  reports that he has never smoked. He has never used smokeless tobacco. He reports that he does not  drink alcohol and does not use drugs.  ROS: A complete review of systems was performed.  All systems are negative except for pertinent findings as noted.  Physical Exam:  Vital signs in last 24 hours: There were no vitals taken for this visit. Constitutional:  Alert and oriented, No acute distress Cardiovascular: Regular rate  Respiratory: Normal respiratory effort GI: Abdomen is soft, nontender, nondistended, no abdominal masses. No CVAT.  Genitourinary: Normal uncircumcised male phallus, testes are descended bilaterally and non-tender and without masses,.  Slightly atrophic.  Scrotum is normal in appearance without lesions or masses, perineum is normal on inspection. Lymphatic: No lymphadenopathy Neurologic: Grossly intact, no focal deficits Psychiatric: Normal mood and affect  I have reviewed prior pt notes  I have reviewed urinalysis results  I have independently reviewed prior imaging  I have reviewed prior PSA results  I have reviewed prior urine culture  Impression/Assessment:    Recurrent prostate cancer, on androgen deprivation therapy and  bone therapy, stable, relatively asymptomatic  Plan:  1.  Lupron 64-monthadministration and Xgeva administered today  2.  I will see back in 4 months for next Lupron and following laboratories  3.  Continue calcium/vitamin D

## 2020-05-25 ENCOUNTER — Encounter: Payer: Self-pay | Admitting: Urology

## 2020-05-25 ENCOUNTER — Other Ambulatory Visit: Payer: Self-pay

## 2020-05-25 ENCOUNTER — Ambulatory Visit: Payer: PPO | Admitting: Urology

## 2020-05-25 VITALS — BP 117/80 | HR 105 | Wt 270.0 lb

## 2020-05-25 DIAGNOSIS — Z87442 Personal history of urinary calculi: Secondary | ICD-10-CM | POA: Diagnosis not present

## 2020-05-25 DIAGNOSIS — C61 Malignant neoplasm of prostate: Secondary | ICD-10-CM

## 2020-05-25 DIAGNOSIS — C7951 Secondary malignant neoplasm of bone: Secondary | ICD-10-CM

## 2020-05-25 LAB — MICROSCOPIC EXAMINATION
Renal Epithel, UA: NONE SEEN /hpf
WBC, UA: >30 /hpf — AB (ref 0–5)

## 2020-05-25 LAB — URINALYSIS, ROUTINE W REFLEX MICROSCOPIC
Bilirubin, UA: NEGATIVE
Glucose, UA: NEGATIVE
Ketones, UA: NEGATIVE
Nitrite, UA: NEGATIVE
Specific Gravity, UA: 1.025 (ref 1.005–1.030)
Urobilinogen, Ur: 0.2 mg/dL (ref 0.2–1.0)
pH, UA: 6 (ref 5.0–7.5)

## 2020-05-25 MED ORDER — LEUPROLIDE ACETATE (4 MONTH) 30 MG ~~LOC~~ KIT
30.0000 mg | PACK | Freq: Once | SUBCUTANEOUS | Status: AC
  Administered 2020-05-25: 30 mg via SUBCUTANEOUS

## 2020-05-25 MED ORDER — ELIGARD 30 MG ~~LOC~~ KIT: 30.0000 mg | PACK | SUBCUTANEOUS | 2 refills | Status: DC

## 2020-05-25 MED ORDER — DENOSUMAB 120 MG/1.7ML ~~LOC~~ SOLN
120.0000 mg | Freq: Once | SUBCUTANEOUS | Status: AC
Start: 2020-05-25 — End: 2020-05-25
  Administered 2020-05-25: 120 mg via SUBCUTANEOUS

## 2020-05-25 NOTE — Progress Notes (Signed)
Eligard SubQ Injection   Due to Prostate Cancer patient is present today for a Eligard Injection.  Medication: Eligard 4 month Dose: 30 mg  Eligard   Patient tolerated well, no complications were noted  Performed by: Estill Bamberg RN   Patient received xgeva injection today as well SQ. Tolerated injection with no difficulty.

## 2020-05-25 NOTE — Progress Notes (Signed)
Urological Symptom Review  Patient is experiencing the following symptoms: Get up at night to urinate Leakage of urine Weak stream   Review of Systems  Gastrointestinal (upper)  : Negative for upper GI symptoms  Gastrointestinal (lower) : Negative for lower GI symptoms  Constitutional : Night Sweats  Skin: Negative for skin symptoms  Eyes: Negative for eye symptoms  Ear/Nose/Throat : Negative for Ear/Nose/Throat symptoms  Hematologic/Lymphatic: Easy bruising  Cardiovascular : Negative for cardiovascular symptoms  Respiratory : Shortness of breath  Endocrine: Excessive thirst  Musculoskeletal: Back pain  Neurological: Negative for neurological symptoms  Psychologic: Negative for psychiatric symptoms

## 2020-05-25 NOTE — Addendum Note (Signed)
Addended by: Dorisann Frames on: 05/25/2020 10:18 AM   Modules accepted: Orders

## 2020-05-26 DIAGNOSIS — N1832 Chronic kidney disease, stage 3b: Secondary | ICD-10-CM | POA: Diagnosis not present

## 2020-05-26 DIAGNOSIS — E6609 Other obesity due to excess calories: Secondary | ICD-10-CM | POA: Diagnosis not present

## 2020-05-26 DIAGNOSIS — R809 Proteinuria, unspecified: Secondary | ICD-10-CM | POA: Diagnosis not present

## 2020-05-26 DIAGNOSIS — I129 Hypertensive chronic kidney disease with stage 1 through stage 4 chronic kidney disease, or unspecified chronic kidney disease: Secondary | ICD-10-CM | POA: Diagnosis not present

## 2020-05-26 DIAGNOSIS — N200 Calculus of kidney: Secondary | ICD-10-CM | POA: Diagnosis not present

## 2020-06-14 DIAGNOSIS — E212 Other hyperparathyroidism: Secondary | ICD-10-CM | POA: Diagnosis not present

## 2020-06-14 DIAGNOSIS — M25552 Pain in left hip: Secondary | ICD-10-CM | POA: Diagnosis not present

## 2020-06-14 DIAGNOSIS — I429 Cardiomyopathy, unspecified: Secondary | ICD-10-CM | POA: Diagnosis not present

## 2020-06-14 DIAGNOSIS — Z1331 Encounter for screening for depression: Secondary | ICD-10-CM | POA: Diagnosis not present

## 2020-06-14 DIAGNOSIS — E781 Pure hyperglyceridemia: Secondary | ICD-10-CM | POA: Diagnosis not present

## 2020-06-14 DIAGNOSIS — Z6837 Body mass index (BMI) 37.0-37.9, adult: Secondary | ICD-10-CM | POA: Diagnosis not present

## 2020-06-14 DIAGNOSIS — I482 Chronic atrial fibrillation, unspecified: Secondary | ICD-10-CM | POA: Diagnosis not present

## 2020-06-14 DIAGNOSIS — G894 Chronic pain syndrome: Secondary | ICD-10-CM | POA: Diagnosis not present

## 2020-06-14 DIAGNOSIS — Z0001 Encounter for general adult medical examination with abnormal findings: Secondary | ICD-10-CM | POA: Diagnosis not present

## 2020-06-14 DIAGNOSIS — I43 Cardiomyopathy in diseases classified elsewhere: Secondary | ICD-10-CM | POA: Diagnosis not present

## 2020-06-14 DIAGNOSIS — E063 Autoimmune thyroiditis: Secondary | ICD-10-CM | POA: Diagnosis not present

## 2020-06-18 DIAGNOSIS — E039 Hypothyroidism, unspecified: Secondary | ICD-10-CM | POA: Diagnosis not present

## 2020-06-19 LAB — T4, FREE: Free T4: 1.24 ng/dL (ref 0.82–1.77)

## 2020-06-19 LAB — TSH: TSH: 4 u[IU]/mL (ref 0.450–4.500)

## 2020-06-24 ENCOUNTER — Other Ambulatory Visit: Payer: Self-pay

## 2020-06-24 ENCOUNTER — Ambulatory Visit (INDEPENDENT_AMBULATORY_CARE_PROVIDER_SITE_OTHER): Payer: PPO

## 2020-06-24 DIAGNOSIS — C7951 Secondary malignant neoplasm of bone: Secondary | ICD-10-CM

## 2020-06-24 MED ORDER — DENOSUMAB 120 MG/1.7ML ~~LOC~~ SOLN
120.0000 mg | Freq: Once | SUBCUTANEOUS | Status: AC
  Administered 2020-06-24: 10:00:00 120 mg via SUBCUTANEOUS

## 2020-06-24 NOTE — Progress Notes (Signed)
SubQ Injection  Patient is present today for a SubQ Injection for treatment of Malignant neoplasm of prostate  Drug: Xgeva Dose: 120 Location: rt abd Lot: [id number] Exp: 05/24  Patient tolerated well, no complications were noted  Performed by: Nahom Carfagno, LPN  Follow up/ Additional Notes: Keep next scheduled NV

## 2020-06-24 NOTE — Patient Instructions (Signed)
Patient Information Sheet : (Denosumab) Xgeva, Prolia)  What is this drug? Denosumab is a drug that maintains the strength of bones. It is FDA- approved treatment for patients with documented bone metastasis from solid tumors such as prostate cancer under the name of xgeva. It is also an FDA-approved treatment for men with prostate cancer at risk for complications related to bone loss under the name Prolia. Denosumab is not chemotherapy but is used with other treatments for advanced prostate cancer. It is administered by a subcutaneous injection.  Why am I taking this drug? You have been diagnosed with prostate cancer and are felt to be at an increased risk for bone loss related to your treatment or you have advanced prostate cancer that has spread to the bone increasing your risk for bone fractures. It is administered either every 6 months or every month depending on your risk factors for bone fracture.  How can this drug benefit me? Denosumab can reduce the risk of bone fractures and delay complications that occur when cancer has spread to the bones.  What should I do while taking Xgeva or Prolia? You need to have regular dental exams. You need to take calcium 1200mg each day as well as vitamin D 1000 IU per day. There are no restrictions on food, beverages or activity while receiving this medication.  What are the possible side effects of this medication? The most common side effects are fatigue, weakness, nausea and lowering of calcium levels. The most serious reaction os this drug is a serious allergic reaction which is rare. Other serious reactions that would require stopping the medication would be abnormally low blood calcium levels or osteonecrosis of the jaw.  Osteonecrosis of the jaw is a rare condition that involves the loss or breakdown of the jaw bow. Signs and symptoms include pain, swelling or infections of the gums, loosening of the tooth, poor healing of gums, numbness or a  feeling of heaviness in the jaw. You must notify your dentist and your urologist if you have any of these symptoms. Notify your urologist if you need any invasive dental treatments. Notify your dentist prior to starting this treatment. You must provide written clearance from your dentist prior to starting this drug and maintain regular dentist appointments throughout your treatment.   What is involved in receiving this drug? It is a subcutaneous injection (given under the skin) You will have a complete blood chemistry panel drawn prior to starting this drug and repeated every 6 months or as ordered by the doctor. This will check your calcium, magnesium and phosphorus levels.  

## 2020-06-29 ENCOUNTER — Ambulatory Visit: Payer: PPO | Admitting: Nurse Practitioner

## 2020-06-29 ENCOUNTER — Encounter: Payer: Self-pay | Admitting: Nurse Practitioner

## 2020-06-29 ENCOUNTER — Other Ambulatory Visit: Payer: Self-pay

## 2020-06-29 VITALS — BP 140/87 | HR 98 | Wt 276.0 lb

## 2020-06-29 DIAGNOSIS — E039 Hypothyroidism, unspecified: Secondary | ICD-10-CM | POA: Diagnosis not present

## 2020-06-29 DIAGNOSIS — R7303 Prediabetes: Secondary | ICD-10-CM | POA: Diagnosis not present

## 2020-06-29 LAB — POCT GLYCOSYLATED HEMOGLOBIN (HGB A1C): Hemoglobin A1C: 5.8 % — AB (ref 4.0–5.6)

## 2020-06-29 NOTE — Progress Notes (Signed)
06/29/2020      Endocrinology follow-up note   Subjective:    Patient ID: [name] [name], male    DOB: [birth year], PCP [name], MD   Past Medical History:  Diagnosis Date   Cardiomyopathy [facility])    h/o felt 2nd atrial fib, EF normal by echo 2013   CHF (congestive heart failure) ([facility])    Chronic anticoagulation    Chronic atrial fibrillation ([facility])    Chronic kidney disease (CKD), stage IV (severe) ([place])    Dysrhythmia    Hypothyroidism    Pneumonia    Pre-diabetes    Primary localized osteoarthritis of left hip 05/02/2016   Prostate cancer ([place])    Shortness of breath    Systemic hypertension    Past Surgical History:  Procedure Laterality Date   CARDIOVERSION     CATARACT EXTRACTION W/PHACO  04/17/2011   Procedure: CATARACT EXTRACTION PHACO AND INTRAOCULAR LENS PLACEMENT ([employer]);  Surgeon: [name], MD;  [facility];  Service: Ophthalmology;  Laterality: Left;  CDE:14.58   CATARACT EXTRACTION W/PHACO  05/22/2011   Procedure: CATARACT EXTRACTION PHACO AND INTRAOCULAR LENS PLACEMENT (IOC);  Surgeon: [name], MD;  [facility];  Service: Ophthalmology;  Laterality: Right;  CDE 13.02   COLONOSCOPY  01/17/2011   Procedure: COLONOSCOPY;  Surgeon: Jamesetta So;  [facility];  Service: Gastroenterology;  Laterality: N/A;   COLONOSCOPY N/A 02/24/2016   Procedure: COLONOSCOPY;  Surgeon: Rogene Houston, MD;  [facility];  Service: Endoscopy;  Laterality: N/A;  12:50   INGUINAL HERNIA REPAIR Right 09/27/2015   Procedure: RIGHT INGUINAL HERNIORRHAPY WITH MESH SMALL BOWEL RESECTION;  Surgeon: Aviva Signs, MD;  Location: [facility];  Service: General;  Laterality: Right;   [place]  02/2009   dipyridamole; mild perfusion defect due to infarct/scar with mild perinfarct ischemia is apical septal, apical basal inferoseptal, basal inferior, mid inferoseptal mid inferior & apical inferior; EKG negative for ischemia; low risk scan    PROSTATE SURGERY     7 years ago [facility] Dr Javaid-prostatectomy   PROSTATECTOMY     Sleep Study  05/2011   increased upper airway resistance syndrome with mild sleep apnea during REM   TOTAL HIP ARTHROPLASTY Left 05/02/2016   Procedure: LEFT TOTAL HIP ARTHROPLASTY;  Surgeon: Marchia Bond, MD;  Location: [place];  Service: Orthopedics;  Laterality: Left;   TRANSTHORACIC ECHOCARDIOGRAM  04/2011   EF=50-55%; RV mildly dilated; LA mild-mod dilated; mild mitral valve annular calcif, mild MR; mod TR, RVSP elevated 30-52mmHg, mild pulm HTN; AV mildly sclerotic, mild calcif of AV leaflets; trace pulm valve regurg    Social History   Socioeconomic History   Marital status: Single    Spouse name: Not on file   Number of children: 2   Years of education: Not on file   Highest education level: Not on file  Occupational History   Not on file  Tobacco Use   Smoking status: Never   Smokeless tobacco: Never  Vaping Use   Vaping Use: Never used  Substance and Sexual Activity   Alcohol use: No   Drug use: No   Sexual activity: Yes    Birth control/protection: None  Other Topics Concern   Not on file  Social History Narrative   Not on file   Social Determinants of Health   Financial Resource Strain: Not on file  Food Insecurity: Not on file  Transportation Needs: Not on file  Physical  Activity: Not on file  Stress: Not on file  Social Connections: Not on file   Outpatient Encounter Medications as of 06/29/2020  Medication Sig   Calcium Carb-Cholecalciferol (CALCIUM 600+D3) 600-800 MG-UNIT TABS Take 1 tablet by mouth 2 (two) times daily.   EUTHYROX 100 MCG tablet TAKE 1 TABLET BY MOUTH ONCE DAILY BEFORE BREAKFAST   ferrous sulfate 325 (65 FE) MG tablet Take 325 mg by mouth daily with breakfast.   metoprolol tartrate (LOPRESSOR) 100 MG tablet Take 1 tablet (100 mg total) by mouth 2 (two) times daily.   sodium bicarbonate 650 MG tablet Take 650 mg by mouth 2 (two) times daily.   XARELTO 15 MG  TABS tablet TAKE 1 TABLET BY MOUTH ONCE DAILY WITH SUPPER   Facility-Administered Encounter Medications as of 06/29/2020  Medication   neomycin-polymyxin-dexameth (MAXITROL) 0.1 % ophth ointment   ALLERGIES: Allergies  Allergen Reactions   No Known Allergies    VACCINATION STATUS: Immunization History  Administered Date(s) Administered   Moderna Sars-Covid-2 Vaccination 03/29/2019, 04/30/2019    Thyroid Problem Presents for follow-up visit. Patient reports no anxiety, cold intolerance, constipation, depressed mood, fatigue, heat intolerance, leg swelling, palpitations, tremors, weight gain or weight loss. The symptoms have been stable.   80 yr old male with above medical history. 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   He was found to have primary hypothyroidism approximate at age of 70 years and was initiated on Levothyroxine currently at 100 mcg p.o. every morning.   -He is accompanied by his son.  Patient is compliant, no new complaints today.    he denies cold/heat intolerance.  -He denies history of goiter, nor family history of thyroid cancer.  - He survives prostate cancer. -he has history of tertiary hyperparathyroidism with PTH as high as 90 which is currently improving to 26 with supplement of calcium and vitamin D. His most recent calcium is corrected at 8.9, with corresponding PTH of 44.  his prior 24 hour urine calcium has been low at 57.   Review of systems  Constitutional: + Minimally fluctuating body weight,  current Body mass index is 35.44 kg/m. , no fatigue, no subjective hyperthermia, no subjective hypothermia Eyes: no blurry vision, no xerophthalmia ENT: no sore throat, no nodules palpated in throat, no dysphagia/odynophagia, no hoarseness Cardiovascular: no chest pain, no shortness of breath, no palpitations, + leg swelling Respiratory: no  cough, no shortness of breath Gastrointestinal: no nausea/vomiting/diarrhea Musculoskeletal: no muscle/joint aches, walks with cane due to disequilibrium Skin: no rashes, no hyperemia Neurological: no tremors, no numbness, no tingling, no dizziness Psychiatric: no depression, no anxiety    Objective:    BP 140/87   Pulse 98   Wt 276 lb (125.2 kg)   BMI 35.44 kg/m   Wt Readings from Last 3 Encounters:  06/29/20 276 lb (125.2 kg)  05/25/20 270 lb (122.5 kg)  04/30/20 274 lb 6.4 oz (124.5 kg)    BP Readings from Last 3 Encounters:  06/29/20 140/87  05/25/20 117/80  04/30/20 130/74     Physical Exam- Limited  Constitutional:  Body mass index is 35.44 kg/m. , not in acute distress, normal state of mind Eyes:  EOMI, no exophthalmos Neck: Supple Cardiovascular: RRR, no murmurs, rubs, or gallops, + edema to BLE Respiratory: Adequate breathing efforts, no crackles, rales, rhonchi, or wheezing Musculoskeletal: no gross deformities, strength intact in all four extremities, no gross restriction of joint  movements, walks with cane due to disequilibrium Skin:  no rashes, no hyperemia Neurological: no tremor with outstretched hands   Results for orders placed or performed in visit on 06/29/20  HgB A1c  Result Value Ref Range   Hemoglobin A1C 5.8 (A) 4.0 - 5.6 %   HbA1c POC (<> result, manual entry)     HbA1c, POC (prediabetic range)     HbA1c, POC (controlled diabetic range)      Lipid Panel     Component Value Date/Time   CHOL 196 12/22/2019 0950   TRIG 190 (H) 12/22/2019 0950   HDL 40 12/22/2019 0950   CHOLHDL 4.3 02/04/2009 0538   VLDL 24 02/04/2009 0538   LDLCALC 122 (H) 12/22/2019 0950   10/24/2019 labs: Calcium 8.9, PTH 44 TSH 4.77, free T4 1.27 Vitamin D 34.3   Assessment & Plan:   1.  Hypothyroidism -His previsit thyroid function tests are consistent with appropriate hormone replacement.  He is advised to continue Levothyroxine 100 mcg po daily before  breakfast.    - We discussed about the correct intake of his thyroid hormone, on empty stomach at fasting, with water, separated by at least 30 minutes from breakfast and other medications,  and separated by more than 4 hours from calcium, iron, multivitamins, acid reflux medications (PPIs). -Patient is made aware of the fact that thyroid hormone replacement is needed for life, dose to be adjusted by periodic monitoring of thyroid function tests.  2. Tertiary hyperparathyroidism   His last PTH is stable at 44, associated with normal calcium of 8.9.    -He is now vitamin D replete at 40.  He is advised to maintain low-dose over-the-counter vitamin D/calcium supplement.  He does not need high-dose prescription strength vitamin D. His prior 24 hour urine calcium was low at 57 previously.  3. Hypertension:   His BP is controlled to target.  He is advised to continue his Metoprolol 100 mg po twice daily.  4.  Obesity with prediabetes His Body mass index is 35.44 kg/m. putting him at risk for diabetes, his point-of-care A1c today is 5.8% (stable overall).  He does not need medication intervention at this time.  - Nutritional counseling repeated at each appointment due to patients tendency to fall back in to old habits.  - The patient admits there is a room for improvement in their diet and drink choices. -  Suggestion is made for the patient to avoid simple carbohydrates from their diet including Cakes, Sweet Desserts / Pastries, Ice Cream, Soda (diet and regular), Sweet Tea, Candies, Chips, Cookies, Sweet Pastries, Store Bought Juices, Alcohol in Excess of 1-2 drinks a day, Artificial Sweeteners, Coffee Creamer, and "Sugar-free" Products. This will help patient to have stable blood glucose profile and potentially avoid unintended weight gain.   - I encouraged the patient to switch to unprocessed or minimally processed complex starch and increased protein intake (animal or plant source), fruits, and  vegetables.   - Patient is advised to stick to a routine mealtimes to eat 3 meals a day and avoid unnecessary snacks (to snack only to correct hypoglycemia).    - I advised patient to maintain close follow up with [name], MD for primary care needs.     I spent 25 minutes in the care of the patient today including review of labs from [facility], Lipids, Thyroid Function, Hematology (current and previous including abstractions from other facilities); face-to-face time discussing  his blood glucose readings/logs, discussing hypoglycemia and hyperglycemia episodes  and symptoms, medications doses, his options of short and long term treatment based on the latest standards of care / guidelines;  discussion about incorporating lifestyle medicine;  and documenting the encounter.    Please refer to Patient Instructions for Blood Glucose Monitoring and Insulin/Medications Dosing Guide"  in media tab for additional information. Please  also refer to " Patient Self Inventory" in the Media  tab for reviewed elements of pertinent patient history.  L[address]on [address] participated in the discussions, expressed understanding, and voiced agreement with the above plans.  All questions were answered to his satisfaction. he is encouraged to contact clinic should he have any questions or concerns prior to his return visit.   Follow up plan: Return in about 1 year (around 06/29/2021) for Thyroid follow up, Previsit labs.  Rayetta Pigg, [facility] [address] [place], [place] [address] Phone: [phone] Fax: [phone]    06/29/2020, 9:10 AM

## 2020-06-29 NOTE — Patient Instructions (Signed)

## 2020-07-15 ENCOUNTER — Other Ambulatory Visit: Payer: Self-pay | Admitting: Nurse Practitioner

## 2020-07-22 ENCOUNTER — Other Ambulatory Visit: Payer: Self-pay

## 2020-07-22 ENCOUNTER — Ambulatory Visit (INDEPENDENT_AMBULATORY_CARE_PROVIDER_SITE_OTHER): Payer: PPO

## 2020-07-22 DIAGNOSIS — C7951 Secondary malignant neoplasm of bone: Secondary | ICD-10-CM | POA: Diagnosis not present

## 2020-07-22 MED ORDER — DENOSUMAB 120 MG/1.7ML ~~LOC~~ SOLN
120.0000 mg | Freq: Once | SUBCUTANEOUS | Status: AC
  Administered 2020-07-22: 120 mg via SUBCUTANEOUS

## 2020-07-22 NOTE — Progress Notes (Signed)
SubQ Injection  Patient is present today for a SubQ Injection for treatment of Malignant neoplasm of prostate Drug: Xgeva Dose: 120 Location: left abdomen Lot: [id number] Exp: 05/24  Patient tolerated well, no complications were noted  Preformed by: Xia Stohr LPN  Follow up/ Additional Notes: Keep next schedule NV

## 2020-07-22 NOTE — Patient Instructions (Signed)
Patient Information Sheet : (Denosumab) Xgeva, Prolia)  What is this drug? Denosumab is a drug that maintains the strength of bones. It is FDA- approved treatment for patients with documented bone metastasis from solid tumors such as prostate cancer under the name of xgeva. It is also an FDA-approved treatment for men with prostate cancer at risk for complications related to bone loss under the name Prolia. Denosumab is not chemotherapy but is used with other treatments for advanced prostate cancer. It is administered by a subcutaneous injection.  Why am I taking this drug? You have been diagnosed with prostate cancer and are felt to be at an increased risk for bone loss related to your treatment or you have advanced prostate cancer that has spread to the bone increasing your risk for bone fractures. It is administered either every 6 months or every month depending on your risk factors for bone fracture.  How can this drug benefit me? Denosumab can reduce the risk of bone fractures and delay complications that occur when cancer has spread to the bones.  What should I do while taking Xgeva or Prolia? You need to have regular dental exams. You need to take calcium 1200mg each day as well as vitamin D 1000 IU per day. There are no restrictions on food, beverages or activity while receiving this medication.  What are the possible side effects of this medication? The most common side effects are fatigue, weakness, nausea and lowering of calcium levels. The most serious reaction os this drug is a serious allergic reaction which is rare. Other serious reactions that would require stopping the medication would be abnormally low blood calcium levels or osteonecrosis of the jaw.  Osteonecrosis of the jaw is a rare condition that involves the loss or breakdown of the jaw bow. Signs and symptoms include pain, swelling or infections of the gums, loosening of the tooth, poor healing of gums, numbness or a  feeling of heaviness in the jaw. You must notify your dentist and your urologist if you have any of these symptoms. Notify your urologist if you need any invasive dental treatments. Notify your dentist prior to starting this treatment. You must provide written clearance from your dentist prior to starting this drug and maintain regular dentist appointments throughout your treatment.   What is involved in receiving this drug? It is a subcutaneous injection (given under the skin) You will have a complete blood chemistry panel drawn prior to starting this drug and repeated every 6 months or as ordered by the doctor. This will check your calcium, magnesium and phosphorus levels.  

## 2020-08-19 ENCOUNTER — Other Ambulatory Visit: Payer: Self-pay

## 2020-08-19 ENCOUNTER — Ambulatory Visit (INDEPENDENT_AMBULATORY_CARE_PROVIDER_SITE_OTHER): Payer: PPO

## 2020-08-19 DIAGNOSIS — C7951 Secondary malignant neoplasm of bone: Secondary | ICD-10-CM | POA: Diagnosis not present

## 2020-08-19 MED ORDER — DENOSUMAB 120 MG/1.7ML ~~LOC~~ SOLN
120.0000 mg | Freq: Once | SUBCUTANEOUS | Status: AC
  Administered 2020-08-19: 120 mg via SUBCUTANEOUS

## 2020-08-19 NOTE — Patient Instructions (Signed)
Patient Information Sheet : (Denosumab) Xgeva, Prolia)  What is this drug? Denosumab is a drug that maintains the strength of bones. It is FDA- approved treatment for patients with documented bone metastasis from solid tumors such as prostate cancer under the name of xgeva. It is also an FDA-approved treatment for men with prostate cancer at risk for complications related to bone loss under the name Prolia. Denosumab is not chemotherapy but is used with other treatments for advanced prostate cancer. It is administered by a subcutaneous injection.  Why am I taking this drug? You have been diagnosed with prostate cancer and are felt to be at an increased risk for bone loss related to your treatment or you have advanced prostate cancer that has spread to the bone increasing your risk for bone fractures. It is administered either every 6 months or every month depending on your risk factors for bone fracture.  How can this drug benefit me? Denosumab can reduce the risk of bone fractures and delay complications that occur when cancer has spread to the bones.  What should I do while taking Xgeva or Prolia? You need to have regular dental exams. You need to take calcium 1200mg each day as well as vitamin D 1000 IU per day. There are no restrictions on food, beverages or activity while receiving this medication.  What are the possible side effects of this medication? The most common side effects are fatigue, weakness, nausea and lowering of calcium levels. The most serious reaction os this drug is a serious allergic reaction which is rare. Other serious reactions that would require stopping the medication would be abnormally low blood calcium levels or osteonecrosis of the jaw.  Osteonecrosis of the jaw is a rare condition that involves the loss or breakdown of the jaw bow. Signs and symptoms include pain, swelling or infections of the gums, loosening of the tooth, poor healing of gums, numbness or a  feeling of heaviness in the jaw. You must notify your dentist and your urologist if you have any of these symptoms. Notify your urologist if you need any invasive dental treatments. Notify your dentist prior to starting this treatment. You must provide written clearance from your dentist prior to starting this drug and maintain regular dentist appointments throughout your treatment.   What is involved in receiving this drug? It is a subcutaneous injection (given under the skin) You will have a complete blood chemistry panel drawn prior to starting this drug and repeated every 6 months or as ordered by the doctor. This will check your calcium, magnesium and phosphorus levels.  

## 2020-08-19 NOTE — Progress Notes (Signed)
SubQ Injection  Patient is present today for a SubQ Injection for treatment of Malignant Neoplasm of prostate Drug: Delton See Dose: 120 Location: right abdomen Lot: [id number] Exp: 05/24  Patient tolerated well, no complications were noted  Performed by: Alwilda Gilland LPN  Follow up/ Additional Notes: keep next scheduled OV

## 2020-08-23 DIAGNOSIS — R809 Proteinuria, unspecified: Secondary | ICD-10-CM | POA: Diagnosis not present

## 2020-08-23 DIAGNOSIS — E6609 Other obesity due to excess calories: Secondary | ICD-10-CM | POA: Diagnosis not present

## 2020-08-23 DIAGNOSIS — I129 Hypertensive chronic kidney disease with stage 1 through stage 4 chronic kidney disease, or unspecified chronic kidney disease: Secondary | ICD-10-CM | POA: Diagnosis not present

## 2020-08-23 DIAGNOSIS — N200 Calculus of kidney: Secondary | ICD-10-CM | POA: Diagnosis not present

## 2020-08-23 DIAGNOSIS — N1832 Chronic kidney disease, stage 3b: Secondary | ICD-10-CM | POA: Diagnosis not present

## 2020-08-26 DIAGNOSIS — I129 Hypertensive chronic kidney disease with stage 1 through stage 4 chronic kidney disease, or unspecified chronic kidney disease: Secondary | ICD-10-CM | POA: Diagnosis not present

## 2020-08-26 DIAGNOSIS — N200 Calculus of kidney: Secondary | ICD-10-CM | POA: Diagnosis not present

## 2020-08-26 DIAGNOSIS — R809 Proteinuria, unspecified: Secondary | ICD-10-CM | POA: Diagnosis not present

## 2020-08-26 DIAGNOSIS — N1832 Chronic kidney disease, stage 3b: Secondary | ICD-10-CM | POA: Diagnosis not present

## 2020-08-26 DIAGNOSIS — E6609 Other obesity due to excess calories: Secondary | ICD-10-CM | POA: Diagnosis not present

## 2020-09-02 ENCOUNTER — Other Ambulatory Visit: Payer: Self-pay | Admitting: Cardiovascular Disease

## 2020-09-03 NOTE — Telephone Encounter (Signed)
Prescription refill request for Xarelto received.  Indication: Afib Last office visit: 04/30/20 ([place]) [id number]:099.2TS Age:80 Scr: 1.96 (12/22/19) CrCl: 43ml/min  Appropriate dose and refill sent to requested pharmacy.

## 2020-09-27 NOTE — Progress Notes (Signed)
History of Present Illness:   Initial diagnosis 2002--PSA 12, apparently Gleason score 5 (2+3). 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  Underwent EBRT--35 treatments.    CT abdomen and pelvis on 11.20.2017, performed for gross hematuria. This revealed an absent prostate, no evidence for recurrent tumor or metastatic disease within the abdomen or pelvis. Bilateral renal calculi noted. Sclerotic lesion involving L4 vertebra favored to represent a bone island.    2.12.2019: Most recent PSA is up to 5.7. He denies bony pain. He denies significant weight loss.  4.8.2019: Bone scan--abnml uptake in L4 vertebral bodyc/w met PCa  5.7.2019: CT (03/12/2017, 05/07/2017) revealed larger L4 sclerotic met w/ several new foci--L3, T12, left 8th rib, right ilium. No complaints of bony pain.    5.7.2019: He was started on ADT with Firmagon    6.25.2019: first Lupron injection.    10.29.2019: Receiving Xgeva on a monthly basis. He has not had recent laboratories drawn. He is having no blood in his urine or stool. He states that he has a good urinary stream. He has had some recent back pain, but he spent about an hour and half on a lawnmower recently. He is having hot flashes. Most recent PSA from June of this year, following initiation of androgen deprivation therapy was down to 0.2.    3.3.2020: Most recent PSA from October 27, 2017 was less than 0.1. He apparently had labs drawn last week, but the only lab returned was a basic metabolic panel. He has had hot flashes. He denies blood in his urine or stool. He does have urgency and occasional urgency incontinence. No dysuria. He has a good stream. He has limited mobility.    11.3.2020: PSA 6.2020--<0.1. T level castrate. Here today for lupron. He has been having hot flashes but states that these are tolerable given the weather has gotten a lot colder. He is due a repeat PSA. He denies any dysuria or  hematuria. He denies any new urinary complaints. He states that his boney pain has since resolved.    8.3.2020: Pt denies any LUTS and hematuria or loss of appetite or weight. Pt submits no questions or concerns at this time regarding his health. Pt has multiple questions about billing for his ADT.   9.7.2021: Pt states that he would like to continue Lupron instead of proceeding with surgical castration. Pt reports that he is feeling well and has no new aches or pains. He brings in billing info w/ him.   1.11.2022: Pt denies any new aches or pains, but notes frequent hot-flashes. He denies having any new concerns at this time and has remained relatively stable between visits.   3.16.2021: Here today for 4 mo lupron and 1 mo xgeva. No recent labs. He reports that, since last seen for OV, he has been doing quite well with no significant urinary complaints.     5.17.2022: PSA stable at 1.3.  Bone scan and CT scans after last visit both stable.    9.20.2022: No recent urinary complaints.  IPSS 8, quality-of-life score 2.  No blood in his urine.  No new bony pain.  He is on calcium and vitamin D.  Labs have not been drawn recently.  Past Medical History:  Diagnosis Date   Cardiomyopathy [facility])    h/o felt 2nd atrial fib, EF normal by echo 2013   CHF (congestive heart failure) (HCC)    Chronic anticoagulation    Chronic atrial fibrillation (  [place])    Chronic kidney disease (CKD), stage IV (severe) (HCC)    Dysrhythmia    Hypothyroidism    Pneumonia    Pre-diabetes    Primary localized osteoarthritis of left hip 05/02/2016   Prostate cancer ([place])    Shortness of breath    Systemic hypertension     Past Surgical History:  Procedure Laterality Date   CARDIOVERSION     CATARACT EXTRACTION W/PHACO  04/17/2011   Procedure: CATARACT EXTRACTION PHACO AND INTRAOCULAR LENS PLACEMENT ([place]);  Surgeon: [name], MD;  Location: [facility];  Service: Ophthalmology;  Laterality: Left;  CDE:14.58   CATARACT  EXTRACTION W/PHACO  05/22/2011   Procedure: CATARACT EXTRACTION PHACO AND INTRAOCULAR LENS PLACEMENT ([facility]);  Surgeon: [name], MD;  Location: [facility];  Service: Ophthalmology;  Laterality: Right;  CDE 13.02   COLONOSCOPY  01/17/2011   Procedure: COLONOSCOPY;  Surgeon: Jamesetta So;  [facility];  Service: Gastroenterology;  Laterality: N/A;   COLONOSCOPY N/A 02/24/2016   Procedure: COLONOSCOPY;  Surgeon: Rogene Houston, MD;  [facility];  Service: Endoscopy;  Laterality: N/A;  12:50   INGUINAL HERNIA REPAIR Right 09/27/2015   Procedure: RIGHT INGUINAL HERNIORRHAPY WITH MESH SMALL BOWEL RESECTION;  Surgeon: Aviva Signs, MD;  Location: [facility];  Service: General;  Laterality: Right;   [place]  02/2009   dipyridamole; mild perfusion defect due to infarct/scar with mild perinfarct ischemia is apical septal, apical basal inferoseptal, basal inferior, mid inferoseptal mid inferior & apical inferior; EKG negative for ischemia; low risk scan   PROSTATE SURGERY     7 years ago [facility] Dr Javaid-prostatectomy   PROSTATECTOMY     Sleep Study  05/2011   increased upper airway resistance syndrome with mild sleep apnea during REM   TOTAL HIP ARTHROPLASTY Left 05/02/2016   Procedure: LEFT TOTAL HIP ARTHROPLASTY;  Surgeon: Marchia Bond, MD;  Location: [place];  Service: Orthopedics;  Laterality: Left;   TRANSTHORACIC ECHOCARDIOGRAM  04/2011   EF=50-55%; RV mildly dilated; LA mild-mod dilated; mild mitral valve annular calcif, mild MR; mod TR, RVSP elevated 30-53mHg, mild pulm HTN; AV mildly sclerotic, mild calcif of AV leaflets; trace pulm valve regurg     Home Medications:  Allergies as of 09/28/2020       Reactions   No Known Allergies         Medication List        Accurate as of September 27, 2020  8:33 PM. If you have any questions, ask your nurse or doctor.          Calcium 600+D3 600-800 MG-UNIT Tabs Generic drug: Calcium Carb-Cholecalciferol Take 1  tablet by mouth 2 (two) times daily.   Euthyrox 100 MCG tablet Generic drug: levothyroxine TAKE 1 TABLET BY MOUTH ONCE DAILY BEFORE BREAKFAST   ferrous sulfate 325 (65 FE) MG tablet Take 325 mg by mouth daily with breakfast.   metoprolol tartrate 100 MG tablet Commonly known as: LOPRESSOR Take 1 tablet (100 mg total) by mouth 2 (two) times daily.   sodium bicarbonate 650 MG tablet Take 650 mg by mouth 2 (two) times daily.   Xarelto 15 MG Tabs tablet Generic drug: Rivaroxaban TAKE 1 TABLET BY MOUTH ONCE DAILY WITH SUPPER        Allergies:  Allergies  Allergen Reactions   No Known Allergies     Family History  Problem Relation Age of Onset   Diabetes Mother    Prostate cancer  Father    Anesthesia problems Neg Hx    Hypotension Neg Hx    Malignant hyperthermia Neg Hx    Pseudochol deficiency Neg Hx     Social History:  reports that he has never smoked. He has never used smokeless tobacco. He reports that he does not drink alcohol and does not use drugs.  ROS: A complete review of systems was performed.  All systems are negative except for pertinent findings as noted.  Physical Exam:  Vital signs in last 24 hours: There were no vitals taken for this visit. Constitutional:  Alert and oriented, No acute distress Cardiovascular: Regular rate  Respiratory: Normal respiratory effort  Genitourinary: perineum is normal on inspection.  Anal stenosis precludes prostate exam. 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  We will check appropriate  laboratories today  2.  62-monthEligard and Xgeva administered  3.  I will see back in 4 months

## 2020-09-28 ENCOUNTER — Other Ambulatory Visit: Payer: Self-pay

## 2020-09-28 ENCOUNTER — Ambulatory Visit: Payer: PPO | Admitting: Urology

## 2020-09-28 ENCOUNTER — Encounter: Payer: Self-pay | Admitting: Urology

## 2020-09-28 VITALS — BP 126/81 | HR 97 | Ht 74.5 in | Wt 275.0 lb

## 2020-09-28 DIAGNOSIS — Z87442 Personal history of urinary calculi: Secondary | ICD-10-CM

## 2020-09-28 DIAGNOSIS — C61 Malignant neoplasm of prostate: Secondary | ICD-10-CM | POA: Diagnosis not present

## 2020-09-28 DIAGNOSIS — C7951 Secondary malignant neoplasm of bone: Secondary | ICD-10-CM | POA: Diagnosis not present

## 2020-09-28 LAB — URINALYSIS, ROUTINE W REFLEX MICROSCOPIC
Bilirubin, UA: NEGATIVE
Glucose, UA: NEGATIVE
Ketones, UA: NEGATIVE
Nitrite, UA: NEGATIVE
Specific Gravity, UA: 1.02 (ref 1.005–1.030)
Urobilinogen, Ur: 0.2 mg/dL (ref 0.2–1.0)
pH, UA: 6 (ref 5.0–7.5)

## 2020-09-28 LAB — MICROSCOPIC EXAMINATION
Bacteria, UA: NONE SEEN
Renal Epithel, UA: NONE SEEN /hpf
WBC, UA: >30 /hpf — AB (ref 0–5)

## 2020-09-28 MED ORDER — LEUPROLIDE ACETATE (4 MONTH) 30 MG ~~LOC~~ KIT
30.0000 mg | PACK | Freq: Once | SUBCUTANEOUS | Status: AC
  Administered 2020-09-28: 30 mg via SUBCUTANEOUS

## 2020-09-28 MED ORDER — DENOSUMAB 120 MG/1.7ML ~~LOC~~ SOLN
120.0000 mg | Freq: Once | SUBCUTANEOUS | Status: AC
Start: 2020-09-28 — End: 2020-09-28
  Administered 2020-09-28: 120 mg via SUBCUTANEOUS

## 2020-09-28 NOTE — Progress Notes (Signed)
Urological Symptom Review  Patient is experiencing the following symptoms: Frequent urination Hard to postpone urination Get up at night to urinate Leakage of urine Weak stream   Review of Systems  Gastrointestinal (upper)  : Negative for upper GI symptoms  Gastrointestinal (lower) : Negative for lower GI symptoms  Constitutional : Night Sweats Fatigue  Skin: Negative for skin symptoms  Eyes: Negative for eye symptoms  Ear/Nose/Throat : Negative for Ear/Nose/Throat symptoms  Hematologic/Lymphatic: Negative for Hematologic/Lymphatic symptoms  Cardiovascular : Negative for cardiovascular symptoms  Respiratory : Cough Shortness of breath  Endocrine: Negative for endocrine symptoms  Musculoskeletal: Back pain Joint pain  Neurological: Negative for neurological symptoms  Psychologic: Negative for psychiatric symptoms

## 2020-09-28 NOTE — Progress Notes (Signed)
Eligard SubQ Injection   Due to Prostate Cancer patient is present today for a Eligard Injection.  Medication: Eligard 4 month Dose: 30 mg  Location: left    Patient tolerated well, no complications were noted  Performed by: Estill Bamberg RN  SubQ Injection  Patient is present today for a SubQ Injection for treatment of bone metastatic cancer Drug: xgeva Dose: 120mg  Location: left lower abd Patient tolerated well, no complications were noted  Preformed by: Lurline Hare  Follow up/ Additional Notes: 1 month NV

## 2020-09-29 LAB — TESTOSTERONE: Testosterone: 20 ng/dL — ABNORMAL LOW (ref 264–916)

## 2020-09-29 LAB — PSA: Prostate Specific Ag, Serum: 2.3 ng/mL (ref 0.0–4.0)

## 2020-10-11 NOTE — Progress Notes (Signed)
Results sent via my chart 

## 2020-10-16 ENCOUNTER — Other Ambulatory Visit: Payer: Self-pay | Admitting: "Endocrinology

## 2020-10-28 ENCOUNTER — Ambulatory Visit (INDEPENDENT_AMBULATORY_CARE_PROVIDER_SITE_OTHER): Payer: PPO

## 2020-10-28 ENCOUNTER — Other Ambulatory Visit: Payer: Self-pay

## 2020-10-28 DIAGNOSIS — C7951 Secondary malignant neoplasm of bone: Secondary | ICD-10-CM

## 2020-10-28 MED ORDER — DENOSUMAB 120 MG/1.7ML ~~LOC~~ SOLN
120.0000 mg | Freq: Once | SUBCUTANEOUS | Status: AC
Start: 2020-10-28 — End: 2020-10-28
  Administered 2020-10-28: 120 mg via SUBCUTANEOUS

## 2020-10-28 NOTE — Patient Instructions (Signed)
Patient Information Sheet : (Denosumab) Xgeva, Prolia)  What is this drug? Denosumab is a drug that maintains the strength of bones. It is FDA- approved treatment for patients with documented bone metastasis from solid tumors such as prostate cancer under the name of xgeva. It is also an FDA-approved treatment for men with prostate cancer at risk for complications related to bone loss under the name Prolia. Denosumab is not chemotherapy but is used with other treatments for advanced prostate cancer. It is administered by a subcutaneous injection.  Why am I taking this drug? You have been diagnosed with prostate cancer and are felt to be at an increased risk for bone loss related to your treatment or you have advanced prostate cancer that has spread to the bone increasing your risk for bone fractures. It is administered either every 6 months or every month depending on your risk factors for bone fracture.  How can this drug benefit me? Denosumab can reduce the risk of bone fractures and delay complications that occur when cancer has spread to the bones.  What should I do while taking Xgeva or Prolia? You need to have regular dental exams. You need to take calcium 1200mg each day as well as vitamin D 1000 IU per day. There are no restrictions on food, beverages or activity while receiving this medication.  What are the possible side effects of this medication? The most common side effects are fatigue, weakness, nausea and lowering of calcium levels. The most serious reaction os this drug is a serious allergic reaction which is rare. Other serious reactions that would require stopping the medication would be abnormally low blood calcium levels or osteonecrosis of the jaw.  Osteonecrosis of the jaw is a rare condition that involves the loss or breakdown of the jaw bow. Signs and symptoms include pain, swelling or infections of the gums, loosening of the tooth, poor healing of gums, numbness or a  feeling of heaviness in the jaw. You must notify your dentist and your urologist if you have any of these symptoms. Notify your urologist if you need any invasive dental treatments. Notify your dentist prior to starting this treatment. You must provide written clearance from your dentist prior to starting this drug and maintain regular dentist appointments throughout your treatment.   What is involved in receiving this drug? It is a subcutaneous injection (given under the skin) You will have a complete blood chemistry panel drawn prior to starting this drug and repeated every 6 months or as ordered by the doctor. This will check your calcium, magnesium and phosphorus levels.  

## 2020-10-28 NOTE — Progress Notes (Signed)
SubQ Injection  Patient is present today for a SubQ Injection  Drug: xgeva Dose: 120mg  Location: right lower abd  Patient tolerated well, no complications were noted  Performed by: Jenisha Faison LPN  Follow up/ Additional Notes: Keep next scheduled NV

## 2020-11-28 ENCOUNTER — Other Ambulatory Visit: Payer: Self-pay | Admitting: Cardiovascular Disease

## 2020-11-29 NOTE — Telephone Encounter (Signed)
Prescription refill request for Xarelto received.  Indication:Afib Last office visit:4/22 Weight:124.7 kg Age:80 Scr:2.0 CrCl:51.96 ml/min  Prescription refilled

## 2020-12-01 ENCOUNTER — Ambulatory Visit (INDEPENDENT_AMBULATORY_CARE_PROVIDER_SITE_OTHER): Payer: PPO

## 2020-12-01 ENCOUNTER — Other Ambulatory Visit: Payer: Self-pay

## 2020-12-01 DIAGNOSIS — C7951 Secondary malignant neoplasm of bone: Secondary | ICD-10-CM

## 2020-12-01 DIAGNOSIS — N1832 Chronic kidney disease, stage 3b: Secondary | ICD-10-CM | POA: Diagnosis not present

## 2020-12-01 DIAGNOSIS — N200 Calculus of kidney: Secondary | ICD-10-CM | POA: Diagnosis not present

## 2020-12-01 DIAGNOSIS — E6609 Other obesity due to excess calories: Secondary | ICD-10-CM | POA: Diagnosis not present

## 2020-12-01 DIAGNOSIS — I129 Hypertensive chronic kidney disease with stage 1 through stage 4 chronic kidney disease, or unspecified chronic kidney disease: Secondary | ICD-10-CM | POA: Diagnosis not present

## 2020-12-01 DIAGNOSIS — R809 Proteinuria, unspecified: Secondary | ICD-10-CM | POA: Diagnosis not present

## 2020-12-01 MED ORDER — DENOSUMAB 120 MG/1.7ML ~~LOC~~ SOLN
120.0000 mg | Freq: Once | SUBCUTANEOUS | Status: AC
Start: 2020-12-01 — End: 2020-12-01
  Administered 2020-12-01: 120 mg via SUBCUTANEOUS

## 2020-12-01 NOTE — Progress Notes (Signed)
SubQ Injection  Patient is present today for a SubQ Injection for treatment of prostate cancer Drug: Xgeva Dose: 120mg   Location: left lower abd Lot: [id number] Exp: [id number]  Patient tolerated well, no complications were noted  Performed by: Estill Bamberg RN  Follow up/ Additional Notes: 1 month NV xgeva

## 2020-12-08 DIAGNOSIS — R809 Proteinuria, unspecified: Secondary | ICD-10-CM | POA: Diagnosis not present

## 2020-12-08 DIAGNOSIS — N184 Chronic kidney disease, stage 4 (severe): Secondary | ICD-10-CM | POA: Diagnosis not present

## 2020-12-08 DIAGNOSIS — I129 Hypertensive chronic kidney disease with stage 1 through stage 4 chronic kidney disease, or unspecified chronic kidney disease: Secondary | ICD-10-CM | POA: Diagnosis not present

## 2020-12-08 DIAGNOSIS — E6609 Other obesity due to excess calories: Secondary | ICD-10-CM | POA: Diagnosis not present

## 2020-12-30 ENCOUNTER — Ambulatory Visit (INDEPENDENT_AMBULATORY_CARE_PROVIDER_SITE_OTHER): Payer: PPO

## 2020-12-30 ENCOUNTER — Other Ambulatory Visit: Payer: Self-pay

## 2020-12-30 DIAGNOSIS — C7951 Secondary malignant neoplasm of bone: Secondary | ICD-10-CM | POA: Diagnosis not present

## 2020-12-30 MED ORDER — DENOSUMAB 120 MG/1.7ML ~~LOC~~ SOLN
120.0000 mg | Freq: Once | SUBCUTANEOUS | Status: AC
  Administered 2020-12-30: 10:00:00 120 mg via SUBCUTANEOUS

## 2020-12-30 NOTE — Patient Instructions (Signed)
Patient Information Sheet : (Denosumab) Xgeva, Prolia)  What is this drug? Denosumab is a drug that maintains the strength of bones. It is FDA- approved treatment for patients with documented bone metastasis from solid tumors such as prostate cancer under the name of xgeva. It is also an FDA-approved treatment for men with prostate cancer at risk for complications related to bone loss under the name Prolia. Denosumab is not chemotherapy but is used with other treatments for advanced prostate cancer. It is administered by a subcutaneous injection.  Why am I taking this drug? You have been diagnosed with prostate cancer and are felt to be at an increased risk for bone loss related to your treatment or you have advanced prostate cancer that has spread to the bone increasing your risk for bone fractures. It is administered either every 6 months or every month depending on your risk factors for bone fracture.  How can this drug benefit me? Denosumab can reduce the risk of bone fractures and delay complications that occur when cancer has spread to the bones.  What should I do while taking Xgeva or Prolia? You need to have regular dental exams. You need to take calcium 1200mg each day as well as vitamin D 1000 IU per day. There are no restrictions on food, beverages or activity while receiving this medication.  What are the possible side effects of this medication? The most common side effects are fatigue, weakness, nausea and lowering of calcium levels. The most serious reaction os this drug is a serious allergic reaction which is rare. Other serious reactions that would require stopping the medication would be abnormally low blood calcium levels or osteonecrosis of the jaw.  Osteonecrosis of the jaw is a rare condition that involves the loss or breakdown of the jaw bow. Signs and symptoms include pain, swelling or infections of the gums, loosening of the tooth, poor healing of gums, numbness or a  feeling of heaviness in the jaw. You must notify your dentist and your urologist if you have any of these symptoms. Notify your urologist if you need any invasive dental treatments. Notify your dentist prior to starting this treatment. You must provide written clearance from your dentist prior to starting this drug and maintain regular dentist appointments throughout your treatment.   What is involved in receiving this drug? It is a subcutaneous injection (given under the skin) You will have a complete blood chemistry panel drawn prior to starting this drug and repeated every 6 months or as ordered by the doctor. This will check your calcium, magnesium and phosphorus levels.  

## 2020-12-30 NOTE — Progress Notes (Signed)
SubQ Injection   Patient is present today for a SubQ Injection for treatment of prostate cancer Drug: Xgeva Dose: 120mg   Location: right lower abd Lot: [id number] Exp: 10/2022   Patient tolerated well, no complications were noted   Performed by: Marquetta Weiskopf LPN   Follow up/ Additional Notes: Keep next scheduled OV with 1 month  xgeva

## 2021-01-18 ENCOUNTER — Other Ambulatory Visit: Payer: Self-pay

## 2021-01-18 MED ORDER — LEVOTHYROXINE SODIUM 100 MCG PO TABS: 100.0000 ug | ORAL_TABLET | Freq: Every day | ORAL | 0 refills | Status: AC

## 2021-01-30 NOTE — Progress Notes (Signed)
History of Present Illness: Here for f/u of advanced PCa.  Initial diagnosis 2002--PSA 12, apparently Gleason score 5 (2+3). 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  Underwent EBRT--35 treatments.    CT abdomen and pelvis on 11.20.2017, performed for gross hematuria. This revealed an absent prostate, no evidence for recurrent tumor or metastatic disease within the abdomen or pelvis. Bilateral renal calculi noted. Sclerotic lesion involving L4 vertebra favored to represent a bone island.    2.12.2019: Most recent PSA is up to 5.7. He denies bony pain. He denies significant weight loss.  4.8.2019: Bone scan--abnml uptake in L4 vertebral bodyc/w met PCa  5.7.2019: CT (03/12/2017, 05/07/2017) revealed larger L4 sclerotic met w/ several new foci--L3, T12, left 8th rib, right ilium. No complaints of bony pain.    5.7.2019: He was started on ADT with Firmagon    6.25.2019: first Lupron injection.    10.29.2019: Receiving Xgeva on a monthly basis. He has not had recent laboratories drawn. He is having no blood in his urine or stool. He states that he has a good urinary stream. He has had some recent back pain, but he spent about an hour and half on a lawnmower recently. He is having hot flashes. Most recent PSA from June of this year, following initiation of androgen deprivation therapy was down to 0.2.    3.3.2020: Most recent PSA from October 27, 2017 was less than 0.1. He apparently had labs drawn last week, but the only lab returned was a basic metabolic panel. He has had hot flashes. He denies blood in his urine or stool. He does have urgency and occasional urgency incontinence. No dysuria. He has a good stream. He has limited mobility.    11.3.2020: PSA 6.2020--<0.1. T level castrate. Here today for lupron. He has been having hot flashes but states that these are tolerable given the weather has gotten a lot colder. He is due a repeat PSA.  He denies any dysuria or hematuria. He denies any new urinary complaints. He states that his boney pain has since resolved.    8.3.2020: Pt denies any LUTS and hematuria or loss of appetite or weight. Pt submits no questions or concerns at this time regarding his health. Pt has multiple questions about billing for his ADT.   9.7.2021: Pt states that he would like to continue Lupron instead of proceeding with surgical castration. Pt reports that he is feeling well and has no new aches or pains. He brings in billing info w/ him.   1.11.2022: Pt denies any new aches or pains, but notes frequent hot-flashes. He denies having any new concerns at this time and has remained relatively stable between visits.   3.16.2021: Here today for 4 mo lupron and 1 mo xgeva. No recent labs. He reports that, since last seen for OV, he has been doing quite well with no significant urinary complaints.     5.17.2022: PSA stable at 1.3.  Bone scan and CT scans after last visit both stable.     9.20.2022: PSA 2.3, T level 20. No new bony pain.  He is on calcium and vitamin D.  Leuprolide 30 mg administered.  1.24.2023: Routine visit for this gentleman.  His son [name] accompanies him.  There has been no specific issues since his last visit with me 4 months ago.  He is due for his 87-monthEligard injection today.  No bony pain.  Stable weight, normal appetite.  No  gross hematuria, no blood per rectum.  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 Past Medical History:  Diagnosis Date   Cardiomyopathy [facility])    h/o felt 2nd atrial fib, EF normal by echo 2013   CHF (congestive heart failure) (HCC)    Chronic anticoagulation    Chronic atrial fibrillation (HCC)    Chronic kidney disease (CKD), stage IV (severe) (HCC)    Dysrhythmia    Hypothyroidism    Pneumonia    Pre-diabetes    Primary localized osteoarthritis of left hip 05/02/2016   Prostate cancer ([place])    Shortness of  breath    Systemic hypertension     Past Surgical History:  Procedure Laterality Date   CARDIOVERSION     CATARACT EXTRACTION W/PHACO  04/17/2011   Procedure: CATARACT EXTRACTION PHACO AND INTRAOCULAR LENS PLACEMENT ([place]);  Surgeon: [name], MD;  Location: [facility];  Service: Ophthalmology;  Laterality: Left;  CDE:14.58   CATARACT EXTRACTION W/PHACO  05/22/2011   Procedure: CATARACT EXTRACTION PHACO AND INTRAOCULAR LENS PLACEMENT ([facility]);  Surgeon: [name], MD;  Location: [facility];  Service: Ophthalmology;  Laterality: Right;  CDE 13.02   COLONOSCOPY  01/17/2011   Procedure: COLONOSCOPY;  Surgeon: Jamesetta So;  [facility];  Service: Gastroenterology;  Laterality: N/A;   COLONOSCOPY N/A 02/24/2016   Procedure: COLONOSCOPY;  Surgeon: Rogene Houston, MD;  [facility];  Service: Endoscopy;  Laterality: N/A;  12:50   INGUINAL HERNIA REPAIR Right 09/27/2015   Procedure: RIGHT INGUINAL HERNIORRHAPY WITH MESH SMALL BOWEL RESECTION;  Surgeon: Aviva Signs, MD;  Location: [facility];  Service: General;  Laterality: Right;   [place]  02/2009   dipyridamole; mild perfusion defect due to infarct/scar with mild perinfarct ischemia is apical septal, apical basal inferoseptal, basal inferior, mid inferoseptal mid inferior & apical inferior; EKG negative for ischemia; low risk scan   PROSTATE SURGERY     7 years ago [facility] Dr Javaid-prostatectomy   PROSTATECTOMY     Sleep Study  05/2011   increased upper airway resistance syndrome with mild sleep apnea during REM   TOTAL HIP ARTHROPLASTY Left 05/02/2016   Procedure: LEFT TOTAL HIP ARTHROPLASTY;  Surgeon: Marchia Bond, MD;  Location: [place];  Service: Orthopedics;  Laterality: Left;   TRANSTHORACIC ECHOCARDIOGRAM  04/2011   EF=50-55%; RV mildly dilated; LA mild-mod dilated; mild mitral valve annular calcif, mild MR; mod TR, RVSP elevated 30-61mHg, mild pulm HTN; AV mildly sclerotic, mild calcif of AV leaflets; trace pulm valve  regurg     Home Medications:  Allergies as of 02/01/2021       Reactions   No Known Allergies         Medication List        Accurate as of January 30, 2021  3:35 PM. If you have any questions, ask your nurse or doctor.          Calcium 600+D3 600-20 MG-MCG Tabs Generic drug: Calcium Carb-Cholecalciferol Take 1 tablet by mouth 2 (two) times daily.   ferrous sulfate 325 (65 FE) MG tablet Take 325 mg by mouth daily with breakfast.   levothyroxine 100 MCG tablet Commonly known as: SYNTHROID Take 1 tablet (100 mcg total) by mouth daily before breakfast.   metoprolol tartrate 100 MG tablet Commonly known as: LOPRESSOR Take 1 tablet (100 mg total) by mouth 2 (two) times daily.   sodium bicarbonate 650 MG tablet Take  650 mg by mouth 2 (two) times daily.   Xarelto 15 MG Tabs tablet Generic drug: Rivaroxaban TAKE 1 TABLET BY MOUTH ONCE DAILY WITH SUPPER        Allergies:  Allergies  Allergen Reactions   No Known Allergies     Family History  Problem Relation Age of Onset   Diabetes Mother    Prostate cancer Father    Anesthesia problems Neg Hx    Hypotension Neg Hx    Malignant hyperthermia Neg Hx    Pseudochol deficiency Neg Hx     Social History:  reports that he has never smoked. He has never used smokeless tobacco. He reports that he does not drink alcohol and does not use drugs.  ROS: A complete review of systems was performed.  All systems are negative except for pertinent findings as noted.  Physical Exam:  Vital signs in last 24 hours: There were no vitals taken for this visit. Constitutional:  Alert and oriented, No acute distress Psychiatric: Normal mood and affect  I have reviewed prior pt notes  I have reviewed urinalysis results  I have independently reviewed prior imaging  I have reviewed prior PSA/testosterone results    Impression/Assessment:  1.  History of prostate cancer, status post surgery and salvage radiotherapy with  increasing PSA trend with skeletal metastases.  On long-term ADT.  Slow upward trend.  Patient asymptomatic.  2.  Long-term/chronic medication use follow-up  Plan:  1.  PSA and testosterone were checked today  2.  Eligard 30 mg IM administered as well as Xgeva  3.  I will set up an appointment for bone scan and CT scans and call with results  4.  Continue monthly Xgeva, every 4 month Eligard  5.  Eventually, the patient will be referred to see Dr. Delton Coombes at regional [place] for second line therapy, depending on PSA curve and imaging results

## 2021-02-01 ENCOUNTER — Encounter: Payer: Self-pay | Admitting: Urology

## 2021-02-01 ENCOUNTER — Other Ambulatory Visit: Payer: Self-pay

## 2021-02-01 ENCOUNTER — Ambulatory Visit: Payer: PPO | Admitting: Urology

## 2021-02-01 VITALS — BP 101/61 | HR 106

## 2021-02-01 DIAGNOSIS — Z87442 Personal history of urinary calculi: Secondary | ICD-10-CM | POA: Diagnosis not present

## 2021-02-01 DIAGNOSIS — C61 Malignant neoplasm of prostate: Secondary | ICD-10-CM

## 2021-02-01 DIAGNOSIS — Z79899 Other long term (current) drug therapy: Secondary | ICD-10-CM | POA: Diagnosis not present

## 2021-02-01 DIAGNOSIS — C7951 Secondary malignant neoplasm of bone: Secondary | ICD-10-CM

## 2021-02-01 LAB — MICROSCOPIC EXAMINATION
Renal Epithel, UA: NONE SEEN /hpf
WBC, UA: >30 /hpf — AB (ref 0–5)

## 2021-02-01 LAB — URINALYSIS, ROUTINE W REFLEX MICROSCOPIC
Bilirubin, UA: NEGATIVE
Glucose, UA: NEGATIVE
Nitrite, UA: NEGATIVE
Specific Gravity, UA: 1.02 (ref 1.005–1.030)
Urobilinogen, Ur: 0.2 mg/dL (ref 0.2–1.0)
pH, UA: 6 (ref 5.0–7.5)

## 2021-02-01 MED ORDER — LEUPROLIDE ACETATE (4 MONTH) 30 MG ~~LOC~~ KIT
30.0000 mg | PACK | Freq: Once | SUBCUTANEOUS | Status: AC
  Administered 2021-02-01: 10:00:00 30 mg via SUBCUTANEOUS

## 2021-02-01 MED ORDER — DENOSUMAB 120 MG/1.7ML ~~LOC~~ SOLN
120.0000 mg | Freq: Once | SUBCUTANEOUS | Status: AC
  Administered 2021-02-01: 10:00:00 120 mg via SUBCUTANEOUS

## 2021-02-01 NOTE — Progress Notes (Signed)
SubQ Injection  Patient is present today for a SubQ Injection for treatment of  bone metastatic cancer Drug: Xgeva Dose: 120 mg Location: left upper abd  Patient tolerated well, no complications were noted  Performed by: Dontarious Schaum LPN  Follow up/ Additional Notes: keep next scheduled NV  SubQ Injection  Patient is present today for a SubQ Injection for treatment of Prostate cancer Drug: Eligard Dose: 30 mg Location: right   Patient tolerated well, no complications were noted  Performed by: Kalyan Barabas LPN  Urological Symptom Review  Patient is experiencing the following symptoms: Frequent urination Hard to postpone urination Get up at night to urinate Leakage of urine Weak stream   Review of Systems  Gastrointestinal (upper)  : Negative for upper GI symptoms  Gastrointestinal (lower) : Negative for lower GI symptoms  Constitutional : Night Sweats  Skin: Negative for skin symptoms  Eyes: Negative for eye symptoms  Ear/Nose/Throat : Sinus problems  Hematologic/Lymphatic: Easy bruising  Cardiovascular : Negative for cardiovascular symptoms  Respiratory : Cough Shortness of breath  Endocrine: Negative for endocrine symptoms  Musculoskeletal: Back pain Joint pain  Neurological: Negative for neurological symptoms  Psychologic: Negative for psychiatric symptoms

## 2021-02-02 LAB — PSA: Prostate Specific Ag, Serum: 4.6 ng/mL — ABNORMAL HIGH (ref 0.0–4.0)

## 2021-02-02 LAB — TESTOSTERONE: Testosterone: 27 ng/dL — ABNORMAL LOW (ref 264–916)

## 2021-02-07 DIAGNOSIS — N184 Chronic kidney disease, stage 4 (severe): Secondary | ICD-10-CM | POA: Diagnosis not present

## 2021-02-07 DIAGNOSIS — R809 Proteinuria, unspecified: Secondary | ICD-10-CM | POA: Diagnosis not present

## 2021-02-07 DIAGNOSIS — E6609 Other obesity due to excess calories: Secondary | ICD-10-CM | POA: Diagnosis not present

## 2021-02-07 DIAGNOSIS — I129 Hypertensive chronic kidney disease with stage 1 through stage 4 chronic kidney disease, or unspecified chronic kidney disease: Secondary | ICD-10-CM | POA: Diagnosis not present

## 2021-02-11 DIAGNOSIS — N1832 Chronic kidney disease, stage 3b: Secondary | ICD-10-CM | POA: Diagnosis not present

## 2021-02-11 DIAGNOSIS — E6609 Other obesity due to excess calories: Secondary | ICD-10-CM | POA: Diagnosis not present

## 2021-02-11 DIAGNOSIS — R809 Proteinuria, unspecified: Secondary | ICD-10-CM | POA: Diagnosis not present

## 2021-02-11 DIAGNOSIS — N200 Calculus of kidney: Secondary | ICD-10-CM | POA: Diagnosis not present

## 2021-02-11 DIAGNOSIS — I129 Hypertensive chronic kidney disease with stage 1 through stage 4 chronic kidney disease, or unspecified chronic kidney disease: Secondary | ICD-10-CM | POA: Diagnosis not present

## 2021-02-28 DIAGNOSIS — N200 Calculus of kidney: Secondary | ICD-10-CM | POA: Diagnosis not present

## 2021-02-28 DIAGNOSIS — I129 Hypertensive chronic kidney disease with stage 1 through stage 4 chronic kidney disease, or unspecified chronic kidney disease: Secondary | ICD-10-CM | POA: Diagnosis not present

## 2021-02-28 DIAGNOSIS — N1832 Chronic kidney disease, stage 3b: Secondary | ICD-10-CM | POA: Diagnosis not present

## 2021-02-28 DIAGNOSIS — E6609 Other obesity due to excess calories: Secondary | ICD-10-CM | POA: Diagnosis not present

## 2021-02-28 DIAGNOSIS — R809 Proteinuria, unspecified: Secondary | ICD-10-CM | POA: Diagnosis not present

## 2021-03-08 ENCOUNTER — Other Ambulatory Visit: Payer: Self-pay

## 2021-03-08 ENCOUNTER — Ambulatory Visit (INDEPENDENT_AMBULATORY_CARE_PROVIDER_SITE_OTHER): Payer: PPO

## 2021-03-08 DIAGNOSIS — C7951 Secondary malignant neoplasm of bone: Secondary | ICD-10-CM | POA: Diagnosis not present

## 2021-03-08 MED ORDER — DENOSUMAB 120 MG/1.7ML ~~LOC~~ SOLN
120.0000 mg | Freq: Once | SUBCUTANEOUS | Status: AC
  Administered 2021-03-08: 120 mg via SUBCUTANEOUS

## 2021-03-08 NOTE — Patient Instructions (Signed)
Patient Information Sheet : (Denosumab) Xgeva, Prolia)  What is this drug? Denosumab is a drug that maintains the strength of bones. It is FDA- approved treatment for patients with documented bone metastasis from solid tumors such as prostate cancer under the name of xgeva. It is also an FDA-approved treatment for men with prostate cancer at risk for complications related to bone loss under the name Prolia. Denosumab is not chemotherapy but is used with other treatments for advanced prostate cancer. It is administered by a subcutaneous injection.  Why am I taking this drug? You have been diagnosed with prostate cancer and are felt to be at an increased risk for bone loss related to your treatment or you have advanced prostate cancer that has spread to the bone increasing your risk for bone fractures. It is administered either every 6 months or every month depending on your risk factors for bone fracture.  How can this drug benefit me? Denosumab can reduce the risk of bone fractures and delay complications that occur when cancer has spread to the bones.  What should I do while taking Xgeva or Prolia? You need to have regular dental exams. You need to take calcium 1200mg each day as well as vitamin D 1000 IU per day. There are no restrictions on food, beverages or activity while receiving this medication.  What are the possible side effects of this medication? The most common side effects are fatigue, weakness, nausea and lowering of calcium levels. The most serious reaction os this drug is a serious allergic reaction which is rare. Other serious reactions that would require stopping the medication would be abnormally low blood calcium levels or osteonecrosis of the jaw.  Osteonecrosis of the jaw is a rare condition that involves the loss or breakdown of the jaw bow. Signs and symptoms include pain, swelling or infections of the gums, loosening of the tooth, poor healing of gums, numbness or a  feeling of heaviness in the jaw. You must notify your dentist and your urologist if you have any of these symptoms. Notify your urologist if you need any invasive dental treatments. Notify your dentist prior to starting this treatment. You must provide written clearance from your dentist prior to starting this drug and maintain regular dentist appointments throughout your treatment.   What is involved in receiving this drug? It is a subcutaneous injection (given under the skin) You will have a complete blood chemistry panel drawn prior to starting this drug and repeated every 6 months or as ordered by the doctor. This will check your calcium, magnesium and phosphorus levels.  

## 2021-03-08 NOTE — Progress Notes (Signed)
Patient here for xgeva injection.  Tolerated injection with no difficulty.

## 2021-03-29 ENCOUNTER — Ambulatory Visit: Payer: PPO

## 2021-03-30 ENCOUNTER — Other Ambulatory Visit: Payer: Self-pay

## 2021-03-30 ENCOUNTER — Ambulatory Visit (HOSPITAL_COMMUNITY)
Admission: RE | Admit: 2021-03-30 | Discharge: 2021-03-30 | Disposition: A | Discharge status: Home / Self Care | Payer: PPO | Source: Ambulatory Visit | Attending: Urology | Admitting: Urology

## 2021-03-30 DIAGNOSIS — C61 Malignant neoplasm of prostate: Secondary | ICD-10-CM | POA: Insufficient documentation

## 2021-03-30 DIAGNOSIS — N200 Calculus of kidney: Secondary | ICD-10-CM | POA: Diagnosis not present

## 2021-03-30 DIAGNOSIS — I700 Atherosclerosis of aorta: Secondary | ICD-10-CM | POA: Diagnosis not present

## 2021-04-06 ENCOUNTER — Ambulatory Visit: Payer: PPO

## 2021-04-11 DIAGNOSIS — I129 Hypertensive chronic kidney disease with stage 1 through stage 4 chronic kidney disease, or unspecified chronic kidney disease: Secondary | ICD-10-CM | POA: Diagnosis not present

## 2021-04-11 DIAGNOSIS — N1832 Chronic kidney disease, stage 3b: Secondary | ICD-10-CM | POA: Diagnosis not present

## 2021-04-11 DIAGNOSIS — E6609 Other obesity due to excess calories: Secondary | ICD-10-CM | POA: Diagnosis not present

## 2021-04-11 DIAGNOSIS — N200 Calculus of kidney: Secondary | ICD-10-CM | POA: Diagnosis not present

## 2021-04-11 DIAGNOSIS — R809 Proteinuria, unspecified: Secondary | ICD-10-CM | POA: Diagnosis not present

## 2021-04-12 ENCOUNTER — Other Ambulatory Visit (HOSPITAL_COMMUNITY): Payer: Self-pay | Admitting: Urology

## 2021-04-12 ENCOUNTER — Ambulatory Visit (INDEPENDENT_AMBULATORY_CARE_PROVIDER_SITE_OTHER): Payer: PPO | Admitting: Urology

## 2021-04-12 DIAGNOSIS — C61 Malignant neoplasm of prostate: Secondary | ICD-10-CM | POA: Diagnosis not present

## 2021-04-12 MED ORDER — DENOSUMAB 120 MG/1.7ML ~~LOC~~ SOLN
120.0000 mg | Freq: Once | SUBCUTANEOUS | Status: AC
  Administered 2021-04-12: 120 mg via SUBCUTANEOUS

## 2021-04-12 NOTE — Progress Notes (Signed)
SubQ Injection ? ?Patient is present today for a SubQ Injection for treatment of Prostate Cancer ?Drug: Delton See ?Dose: 120 mg ?Location: r[facility]q ? ?Patient tolerated well, no complications were noted ? ?Performed by: Ann-Marie Kluge LPN ? ?Follow up/ Additional Notes: 1 month Xgeva  ? ?FYI Bone scan was cancelled. Looks like because the order expired.  ?I place another order and notified Traci. ?

## 2021-04-12 NOTE — Patient Instructions (Signed)
Patient Information Sheet : (Denosumab) Xgeva, Prolia)  What is this drug? Denosumab is a drug that maintains the strength of bones. It is FDA- approved treatment for patients with documented bone metastasis from solid tumors such as prostate cancer under the name of xgeva. It is also an FDA-approved treatment for men with prostate cancer at risk for complications related to bone loss under the name Prolia. Denosumab is not chemotherapy but is used with other treatments for advanced prostate cancer. It is administered by a subcutaneous injection.  Why am I taking this drug? You have been diagnosed with prostate cancer and are felt to be at an increased risk for bone loss related to your treatment or you have advanced prostate cancer that has spread to the bone increasing your risk for bone fractures. It is administered either every 6 months or every month depending on your risk factors for bone fracture.  How can this drug benefit me? Denosumab can reduce the risk of bone fractures and delay complications that occur when cancer has spread to the bones.  What should I do while taking Xgeva or Prolia? You need to have regular dental exams. You need to take calcium 1200mg each day as well as vitamin D 1000 IU per day. There are no restrictions on food, beverages or activity while receiving this medication.  What are the possible side effects of this medication? The most common side effects are fatigue, weakness, nausea and lowering of calcium levels. The most serious reaction os this drug is a serious allergic reaction which is rare. Other serious reactions that would require stopping the medication would be abnormally low blood calcium levels or osteonecrosis of the jaw.  Osteonecrosis of the jaw is a rare condition that involves the loss or breakdown of the jaw bow. Signs and symptoms include pain, swelling or infections of the gums, loosening of the tooth, poor healing of gums, numbness or a  feeling of heaviness in the jaw. You must notify your dentist and your urologist if you have any of these symptoms. Notify your urologist if you need any invasive dental treatments. Notify your dentist prior to starting this treatment. You must provide written clearance from your dentist prior to starting this drug and maintain regular dentist appointments throughout your treatment.   What is involved in receiving this drug? It is a subcutaneous injection (given under the skin) You will have a complete blood chemistry panel drawn prior to starting this drug and repeated every 6 months or as ordered by the doctor. This will check your calcium, magnesium and phosphorus levels.  

## 2021-04-20 DIAGNOSIS — I129 Hypertensive chronic kidney disease with stage 1 through stage 4 chronic kidney disease, or unspecified chronic kidney disease: Secondary | ICD-10-CM | POA: Diagnosis not present

## 2021-04-20 DIAGNOSIS — E6609 Other obesity due to excess calories: Secondary | ICD-10-CM | POA: Diagnosis not present

## 2021-04-20 DIAGNOSIS — R809 Proteinuria, unspecified: Secondary | ICD-10-CM | POA: Diagnosis not present

## 2021-04-20 DIAGNOSIS — N1832 Chronic kidney disease, stage 3b: Secondary | ICD-10-CM | POA: Diagnosis not present

## 2021-04-21 ENCOUNTER — Encounter (HOSPITAL_COMMUNITY): Payer: Self-pay

## 2021-04-21 ENCOUNTER — Encounter (HOSPITAL_COMMUNITY)
Admission: RE | Admit: 2021-04-21 | Discharge: 2021-04-21 | Disposition: A | Discharge status: Home / Self Care | Payer: PPO | Source: Ambulatory Visit | Attending: Urology | Admitting: Urology

## 2021-04-21 DIAGNOSIS — C61 Malignant neoplasm of prostate: Secondary | ICD-10-CM | POA: Diagnosis not present

## 2021-04-21 DIAGNOSIS — C7951 Secondary malignant neoplasm of bone: Secondary | ICD-10-CM | POA: Diagnosis not present

## 2021-04-21 DIAGNOSIS — M545 Low back pain, unspecified: Secondary | ICD-10-CM | POA: Diagnosis not present

## 2021-04-21 MED ORDER — TECHNETIUM TC 99M MEDRONATE IV KIT
20.0000 | PACK | Freq: Once | INTRAVENOUS | Status: AC | PRN
  Administered 2021-04-21: 19.9 via INTRAVENOUS

## 2021-04-25 ENCOUNTER — Encounter: Payer: Self-pay | Admitting: Urology

## 2021-04-25 NOTE — Telephone Encounter (Signed)
Patient is asking for results.

## 2021-04-26 ENCOUNTER — Telehealth: Payer: Self-pay

## 2021-04-26 NOTE — Telephone Encounter (Signed)
Patient called with no answer. Detailed message left. °

## 2021-04-26 NOTE — Telephone Encounter (Signed)
-----   Message from Franchot Gallo, MD sent at 04/26/2021 12:48 PM EDT ----- ?Please call pt--only 1 small new area seen on 1 of 5 lumbar bones w/ new cancer seen--basically stable ?----- Message ----- ?From: Iris Pert, LPN ?Sent: 04/22/2021   8:36 AM EDT ?To: Franchot Gallo, MD ? ?Please review ?F/u 5/02 ? ?

## 2021-05-06 ENCOUNTER — Encounter: Payer: Self-pay | Admitting: Cardiovascular Disease

## 2021-05-06 ENCOUNTER — Ambulatory Visit (INDEPENDENT_AMBULATORY_CARE_PROVIDER_SITE_OTHER): Payer: PPO | Admitting: Cardiovascular Disease

## 2021-05-06 VITALS — BP 120/84 | HR 93 | Ht 74.0 in | Wt 276.4 lb

## 2021-05-06 DIAGNOSIS — E039 Hypothyroidism, unspecified: Secondary | ICD-10-CM

## 2021-05-06 DIAGNOSIS — E785 Hyperlipidemia, unspecified: Secondary | ICD-10-CM | POA: Diagnosis not present

## 2021-05-06 DIAGNOSIS — D6869 Other thrombophilia: Secondary | ICD-10-CM | POA: Diagnosis not present

## 2021-05-06 DIAGNOSIS — N1832 Chronic kidney disease, stage 3b: Secondary | ICD-10-CM | POA: Diagnosis not present

## 2021-05-06 DIAGNOSIS — I4821 Permanent atrial fibrillation: Secondary | ICD-10-CM

## 2021-05-06 DIAGNOSIS — Z87898 Personal history of other specified conditions: Secondary | ICD-10-CM

## 2021-05-06 NOTE — Progress Notes (Signed)
? ?Cardiology Office Note   ? ?Date:  05/07/2021  ? ?ID:  [name] [name], DOB 01-Mar-1940, MRN [id number] ? ?PCP:  [name], MD  ?Cardiologist:   [name], MD  ? ?No chief complaint on file. ? ? ?History of Present Illness:  ?[name] [name] is a 81 y.o. 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  He is here accompanied by his son (both his son and his daughter-in-law are also my patients). ? ?Presents with better ventricular rate control.  He is asymptomatic.  He walks with a cane due to orthopedic issues and is relatively sedentary.  He has not had problems with dizziness, palpitations or syncope.  He denies orthopnea, PND or exertional dyspnea or exertional angina.  He has not had any falls or injuries or serious bruising.  Denies overt bleeding issues.  Has not had any focal neurological events ? ?His nephrologist is Dr. Lowanda Foster and his endocrinologist is Dr. Dorris Fetch.  His hemoglobin A1c is excellent at 5.8%.  The most recent creatinine on file was 2.0, essentially unchanged from baseline.  He has not had a recent lipid profile.   ? ?A nuclear stress test in 2011 showed reported scar with mild peri-infarct ischemia in the apex and the inferior wall. It was felt to be a low risk scan since no significant ischemia was demonstrated. In August 2017 left ventricular ejection fraction by echo was 50-55% without regional wall motion abnormalities. Both atria were mildly dilated. No significant valvular abnormalities were reported. 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 ? ?Past Medical History:  ?Diagnosis Date  ? Cardiomyopathy [facility])   ? h/o felt 2nd atrial fib, EF normal by  echo 2013  ? CHF (congestive heart failure) ([place])   ? Chronic anticoagulation   ? Chronic atrial fibrillation (HCC)   ? Chronic kidney disease (CKD), stage IV (severe) (HCC)   ? Dysrhythmia   ? Hypothyroidism   ? Pneumonia   ? Pre-diabetes   ? Primary localized osteoarthritis of left hip 05/02/2016  ? Prostate cancer ([place])   ? Shortness of breath   ? Systemic hypertension   ? ? ?Past Surgical History:  ?Procedure Laterality Date  ? CARDIOVERSION    ? CATARACT EXTRACTION W/PHACO  04/17/2011  ? Procedure: CATARACT EXTRACTION PHACO AND INTRAOCULAR LENS PLACEMENT (IOC);  Surgeon: [name], MD;  Location: [facility];  Service: Ophthalmology;  Laterality: Left;  CDE:14.58  ? CATARACT EXTRACTION W/PHACO  05/22/2011  ? Procedure: CATARACT EXTRACTION PHACO AND INTRAOCULAR LENS PLACEMENT (IOC);  Surgeon: [name], MD;  Location: [facility];  Service: Ophthalmology;  Laterality: Right;  CDE 13.02  ? COLONOSCOPY  01/17/2011  ? Procedure: COLONOSCOPY;  Surgeon: Jamesetta So;  Location: [facility];  Service: Gastroenterology;  Laterality: N/A;  ? COLONOSCOPY N/A 02/24/2016  ? Procedure: COLONOSCOPY;  Surgeon: Rogene Houston, MD;  Location: [facility];  Service: Endoscopy;  Laterality: N/A;  12:50  ? INGUINAL HERNIA REPAIR Right 09/27/2015  ? Procedure: RIGHT INGUINAL HERNIORRHAPY WITH MESH SMALL BOWEL RESECTION;  Surgeon: Aviva Signs, MD;  Location: [facility];  Service: General;  Laterality: Right;  ? [place]  02/2009  ? dipyridamole; mild perfusion defect due to infarct/scar with mild perinfarct ischemia  is apical septal, apical basal inferoseptal, basal inferior, mid inferoseptal mid inferior & apical inferior; EKG negative for ischemia; low risk scan  ? PROSTATE SURGERY    ? 7 years ago [facility] Dr Javaid-prostatectomy  ? PROSTATECTOMY    ? Sleep Study  05/2011  ? increased upper airway resistance syndrome with mild sleep apnea during REM  ? TOTAL HIP ARTHROPLASTY Left 05/02/2016  ? Procedure: LEFT TOTAL HIP  ARTHROPLASTY;  Surgeon: Marchia Bond, MD;  Location: [place];  Service: Orthopedics;  Laterality: Left;  ? TRANSTHORACIC ECHOCARDIOGRAM  04/2011  ? EF=50-55%; RV mildly dilated; LA mild-mod dilated; mild mitral valve annular calcif, mild MR; mod TR, RVSP elevated 30-40mHg, mild pulm HTN; AV mildly sclerotic, mild calcif of AV leaflets; trace pulm valve regurg   ? ? ?Current Medications: ?Outpatient Medications Prior to Visit  ?Medication Sig Dispense Refill  ? Calcium Carb-Cholecalciferol (CALCIUM 600+D3) 600-800 MG-UNIT TABS Take 1 tablet by mouth 2 (two) times daily. 90 tablet 1  ? Cholecalciferol 25 MCG (1000 UT) tablet Take by mouth.    ? ferrous sulfate 325 (65 FE) MG tablet Take 325 mg by mouth daily with breakfast.    ? levothyroxine (SYNTHROID) 100 MCG tablet Take 1 tablet (100 mcg total) by mouth daily before breakfast. 90 tablet 0  ? metoprolol tartrate (LOPRESSOR) 100 MG tablet Take 1 tablet (100 mg total) by mouth 2 (two) times daily. 180 tablet 3  ? Rivaroxaban (XARELTO) 15 MG TABS tablet TAKE 1 TABLET BY MOUTH ONCE DAILY WITH SUPPER 60 tablet 5  ? sodium bicarbonate 650 MG tablet Take 650 mg by mouth 2 (two) times daily.    ? valsartan (DIOVAN) 40 MG tablet Take 40 mg by mouth daily.    ? ?Facility-Administered Medications Prior to Visit  ?Medication Dose Route Frequency Provider Last Rate Last Admin  ? neomycin-polymyxin-dexameth (MAXITROL) 0.1 % ophth ointment    PRN HTonny Branch MD   1 application. at 04/18/11 04235 ?  ? ?Allergies:   No known allergies  ? ?Social History  ? ?Socioeconomic History  ? Marital status: Single  ?  Spouse name: Not on file  ? Number of children: 2  ? Years of education: Not on file  ? Highest education level: Not on file  ?Occupational History  ? Not on file  ?Tobacco Use  ? Smoking status: Never  ? Smokeless tobacco: Never  ?Vaping Use  ? Vaping Use: Never used  ?Substance and Sexual Activity  ? Alcohol use: No  ? Drug use: No  ? Sexual activity: Yes  ?  Birth  control/protection: None  ?Other Topics Concern  ? Not on file  ?Social History Narrative  ? Not on file  ? ?Social Determinants of Health  ? ?Financial Resource Strain: Not on file  ?Food Insecurity: Not on file  ?Transportation Needs: Not on file  ?Physical Activity: Not on file  ?Stress: Not on file  ?Social Connections: Not on file  ?  ? ?Family History:  The patient's  ?family history includes Diabetes in his mother; Prostate cancer in his father.  ? ?ROS:   ?Please see the history of present illness.   All other systems are reviewed and are negative.  ? ?PHYSICAL EXAM:   ?VS:  BP 120/84 (BP Location: Left Arm, Patient Position: Sitting, Cuff Size: Large)   Pulse 93   Ht '6\' 2"'$  (1.88 m)   Wt 276 lb 6.4 oz (125.4 kg)   SpO2 97%   BMI 35.49  kg/m?    ? ? ?General: Alert, oriented x3, no distress, obese ?Head: no evidence of trauma, PERRL, EOMI, no exophtalmos or lid lag, no myxedema, no xanthelasma; normal ears, nose and oropharynx ?Neck: normal jugular venous pulsations and no hepatojugular reflux; brisk carotid pulses without delay and no carotid bruits ?Chest: clear to auscultation, no signs of consolidation by percussion or palpation, normal fremitus, symmetrical and full respiratory excursions ?Cardiovascular: normal position and quality of the apical impulse, irregular rhythm, normal first and second heart sounds, no murmurs, rubs or gallops ?Abdomen: no tenderness or distention, no masses by palpation, no abnormal pulsatility or arterial bruits, normal bowel sounds, no hepatosplenomegaly ?Extremities: no clubbing, cyanosis or edema; 2+ radial, ulnar and brachial pulses bilaterally; 2+ right femoral, posterior tibial and dorsalis pedis pulses; 2+ left femoral, posterior tibial and dorsalis pedis pulses; no subclavian or femoral bruits ?Neurological: grossly nonfocal ?Psych: Normal mood and affect ? ? ?Wt Readings from Last 3 Encounters:  ?05/06/21 276 lb 6.4 oz (125.4 kg)  ?09/28/20 275 lb (124.7 kg)   ?06/29/20 276 lb (125.2 kg)  ?  ? ? ?Studies/Labs Reviewed:  ? ?EKG:  EKG is ordered today.  ECG is ordered today and shows atrial fibrillation with controlled ventricular response, left axis deviation (chronic) ? ?Recent Labs: ?6/1

## 2021-05-06 NOTE — Patient Instructions (Signed)
Medication Instructions:  No changes *If you need a refill on your cardiac medications before your next appointment, please call your pharmacy*   Lab Work: None ordered If you have labs (blood work) drawn today and your tests are completely normal, you will receive your results only by: MyChart Message (if you have MyChart) OR A paper copy in the mail If you have any lab test that is abnormal or we need to change your treatment, we will call you to review the results.   Testing/Procedures: None ordered   Follow-Up: At [facility], you and your health needs are our priority.  As part of our continuing mission to provide you with exceptional heart care, we have created designated Provider Care Teams.  These Care Teams include your primary Cardiologist (physician) and Advanced Practice Providers (APPs -  Physician Assistants and Nurse Practitioners) who all work together to provide you with the care you need, when you need it.  We recommend signing up for the patient portal called "MyChart".  Sign up information is provided on this After Visit Summary.  MyChart is used to connect with patients for Virtual Visits (Telemedicine).  Patients are able to view lab/test results, encounter notes, upcoming appointments, etc.  Non-urgent messages can be sent to your provider as well.   To learn more about what you can do with MyChart, go to [email or web address].    Your next appointment:   12 month(s)  The format for your next appointment:   In Person  Provider:   Dr. Croitoru  Important Information About Sugar       

## 2021-05-10 ENCOUNTER — Ambulatory Visit (INDEPENDENT_AMBULATORY_CARE_PROVIDER_SITE_OTHER): Payer: PPO | Admitting: Urology

## 2021-05-10 DIAGNOSIS — Z8546 Personal history of malignant neoplasm of prostate: Secondary | ICD-10-CM

## 2021-05-10 DIAGNOSIS — C61 Malignant neoplasm of prostate: Secondary | ICD-10-CM

## 2021-05-10 MED ORDER — DENOSUMAB 120 MG/1.7ML ~~LOC~~ SOLN
120.0000 mg | Freq: Once | SUBCUTANEOUS | Status: AC
  Administered 2021-05-10: 120 mg via SUBCUTANEOUS

## 2021-05-10 NOTE — Progress Notes (Signed)
SubQ Injection ? ?Patient is present today for a SubQ Injection for treatment of Prostate Cancer ?Drug: Delton See ?Dose: 120 ?Location: llq ?Patient tolerated well, no complications were noted ? ?Performed by: Meir Elwood LPN ? ?Follow up/ Additional Notes: keep scheduled OV  ?

## 2021-05-12 ENCOUNTER — Other Ambulatory Visit: Payer: Self-pay | Admitting: Cardiovascular Disease

## 2021-05-26 ENCOUNTER — Telehealth: Payer: Self-pay | Admitting: Nurse Practitioner

## 2021-05-26 ENCOUNTER — Other Ambulatory Visit: Payer: Self-pay

## 2021-05-26 DIAGNOSIS — E039 Hypothyroidism, unspecified: Secondary | ICD-10-CM

## 2021-05-26 NOTE — Telephone Encounter (Signed)
Labs ordered.

## 2021-05-26 NOTE — Telephone Encounter (Signed)
Pt has a year follow up and is needing lab orders updated for [facility].

## 2021-05-31 ENCOUNTER — Ambulatory Visit: Payer: PPO | Admitting: Urology

## 2021-06-08 ENCOUNTER — Inpatient Hospital Stay (HOSPITAL_COMMUNITY)
Admission: EM | Admit: 2021-06-08 | Discharge: 2021-06-18 | DRG: 871 | Disposition: A | Discharge status: Skilled Nursing Facility | Payer: PPO | Attending: Internal Medicine | Admitting: Internal Medicine

## 2021-06-08 ENCOUNTER — Other Ambulatory Visit: Payer: Self-pay

## 2021-06-08 ENCOUNTER — Encounter (HOSPITAL_COMMUNITY): Payer: Self-pay

## 2021-06-08 ENCOUNTER — Emergency Department (HOSPITAL_COMMUNITY): Payer: PPO

## 2021-06-08 DIAGNOSIS — R5381 Other malaise: Secondary | ICD-10-CM

## 2021-06-08 DIAGNOSIS — N184 Chronic kidney disease, stage 4 (severe): Secondary | ICD-10-CM | POA: Diagnosis not present

## 2021-06-08 DIAGNOSIS — I501 Left ventricular failure: Secondary | ICD-10-CM | POA: Diagnosis not present

## 2021-06-08 DIAGNOSIS — I480 Paroxysmal atrial fibrillation: Secondary | ICD-10-CM

## 2021-06-08 DIAGNOSIS — Z20822 Contact with and (suspected) exposure to covid-19: Secondary | ICD-10-CM | POA: Diagnosis not present

## 2021-06-08 DIAGNOSIS — C7951 Secondary malignant neoplasm of bone: Secondary | ICD-10-CM | POA: Diagnosis not present

## 2021-06-08 DIAGNOSIS — A419 Sepsis, unspecified organism: Secondary | ICD-10-CM | POA: Diagnosis not present

## 2021-06-08 DIAGNOSIS — J9811 Atelectasis: Secondary | ICD-10-CM | POA: Diagnosis not present

## 2021-06-08 DIAGNOSIS — E872 Acidosis, unspecified: Secondary | ICD-10-CM

## 2021-06-08 DIAGNOSIS — Z8042 Family history of malignant neoplasm of prostate: Secondary | ICD-10-CM

## 2021-06-08 DIAGNOSIS — R21 Rash and other nonspecific skin eruption: Secondary | ICD-10-CM | POA: Diagnosis present

## 2021-06-08 DIAGNOSIS — R509 Fever, unspecified: Secondary | ICD-10-CM | POA: Diagnosis not present

## 2021-06-08 DIAGNOSIS — E039 Hypothyroidism, unspecified: Secondary | ICD-10-CM

## 2021-06-08 DIAGNOSIS — Z96642 Presence of left artificial hip joint: Secondary | ICD-10-CM | POA: Diagnosis not present

## 2021-06-08 DIAGNOSIS — J811 Chronic pulmonary edema: Secondary | ICD-10-CM | POA: Diagnosis not present

## 2021-06-08 DIAGNOSIS — I10 Essential (primary) hypertension: Secondary | ICD-10-CM

## 2021-06-08 DIAGNOSIS — N183 Chronic kidney disease, stage 3 unspecified: Secondary | ICD-10-CM | POA: Diagnosis not present

## 2021-06-08 DIAGNOSIS — I4891 Unspecified atrial fibrillation: Secondary | ICD-10-CM | POA: Diagnosis not present

## 2021-06-08 DIAGNOSIS — Z66 Do not resuscitate: Secondary | ICD-10-CM | POA: Diagnosis present

## 2021-06-08 DIAGNOSIS — R6510 Systemic inflammatory response syndrome (SIRS) of non-infectious origin without acute organ dysfunction: Secondary | ICD-10-CM | POA: Diagnosis not present

## 2021-06-08 DIAGNOSIS — Z79899 Other long term (current) drug therapy: Secondary | ICD-10-CM

## 2021-06-08 DIAGNOSIS — N189 Chronic kidney disease, unspecified: Secondary | ICD-10-CM | POA: Diagnosis not present

## 2021-06-08 DIAGNOSIS — J9601 Acute respiratory failure with hypoxia: Secondary | ICD-10-CM | POA: Diagnosis present

## 2021-06-08 DIAGNOSIS — I129 Hypertensive chronic kidney disease with stage 1 through stage 4 chronic kidney disease, or unspecified chronic kidney disease: Secondary | ICD-10-CM | POA: Diagnosis not present

## 2021-06-08 DIAGNOSIS — N179 Acute kidney failure, unspecified: Secondary | ICD-10-CM | POA: Diagnosis not present

## 2021-06-08 DIAGNOSIS — Z7901 Long term (current) use of anticoagulants: Secondary | ICD-10-CM | POA: Diagnosis not present

## 2021-06-08 DIAGNOSIS — D72829 Elevated white blood cell count, unspecified: Secondary | ICD-10-CM

## 2021-06-08 DIAGNOSIS — Z79890 Hormone replacement therapy (postmenopausal): Secondary | ICD-10-CM | POA: Diagnosis not present

## 2021-06-08 DIAGNOSIS — R6520 Severe sepsis without septic shock: Secondary | ICD-10-CM | POA: Diagnosis present

## 2021-06-08 DIAGNOSIS — R062 Wheezing: Secondary | ICD-10-CM | POA: Diagnosis not present

## 2021-06-08 DIAGNOSIS — J9611 Chronic respiratory failure with hypoxia: Secondary | ICD-10-CM | POA: Diagnosis not present

## 2021-06-08 DIAGNOSIS — N390 Urinary tract infection, site not specified: Secondary | ICD-10-CM | POA: Diagnosis present

## 2021-06-08 DIAGNOSIS — A4159 Other Gram-negative sepsis: Secondary | ICD-10-CM | POA: Diagnosis not present

## 2021-06-08 DIAGNOSIS — E559 Vitamin D deficiency, unspecified: Secondary | ICD-10-CM | POA: Diagnosis not present

## 2021-06-08 DIAGNOSIS — C61 Malignant neoplasm of prostate: Secondary | ICD-10-CM | POA: Diagnosis not present

## 2021-06-08 DIAGNOSIS — D6832 Hemorrhagic disorder due to extrinsic circulating anticoagulants: Secondary | ICD-10-CM | POA: Diagnosis not present

## 2021-06-08 DIAGNOSIS — R0602 Shortness of breath: Secondary | ICD-10-CM | POA: Diagnosis not present

## 2021-06-08 DIAGNOSIS — Z833 Family history of diabetes mellitus: Secondary | ICD-10-CM

## 2021-06-08 LAB — URINALYSIS, ROUTINE W REFLEX MICROSCOPIC
Bilirubin Urine: NEGATIVE
Glucose, UA: NEGATIVE mg/dL
Ketones, ur: NEGATIVE mg/dL
Nitrite: NEGATIVE
Protein, ur: 30 mg/dL — AB
Specific Gravity, Urine: 1.008 (ref 1.005–1.030)
WBC, UA: >50 WBC/hpf — ABNORMAL HIGH (ref 0–5)
pH: 6 (ref 5.0–8.0)

## 2021-06-08 LAB — COMPREHENSIVE METABOLIC PANEL
ALT: 19 U/L (ref 0–44)
AST: 20 U/L (ref 15–41)
Albumin: 3.7 g/dL (ref 3.5–5.0)
Alkaline Phosphatase: 44 U/L (ref 38–126)
Anion gap: 10 (ref 5–15)
BUN: 36 mg/dL — ABNORMAL HIGH (ref 8–23)
CO2: 21 mmol/L — ABNORMAL LOW (ref 22–32)
Calcium: 8.6 mg/dL — ABNORMAL LOW (ref 8.9–10.3)
Chloride: 103 mmol/L (ref 98–111)
Creatinine, Ser: 2.54 mg/dL — ABNORMAL HIGH (ref 0.61–1.24)
GFR, Estimated: 25 mL/min — ABNORMAL LOW (ref >60)
Glucose, Bld: 204 mg/dL — ABNORMAL HIGH (ref 70–99)
Potassium: 4.8 mmol/L (ref 3.5–5.1)
Sodium: 134 mmol/L — ABNORMAL LOW (ref 135–145)
Total Bilirubin: 1.3 mg/dL — ABNORMAL HIGH (ref 0.3–1.2)
Total Protein: 8 g/dL (ref 6.5–8.1)

## 2021-06-08 LAB — CBC WITH DIFFERENTIAL/PLATELET
Abs Immature Granulocytes: 0.16 10*3/uL — ABNORMAL HIGH (ref 0.00–0.07)
Basophils Absolute: 0.1 10*3/uL (ref 0.0–0.1)
Basophils Relative: 1 %
Eosinophils Absolute: 0.1 10*3/uL (ref 0.0–0.5)
Eosinophils Relative: 1 %
HCT: 42.3 % (ref 39.0–52.0)
Hemoglobin: 14 g/dL (ref 13.0–17.0)
Immature Granulocytes: 1 %
Lymphocytes Relative: 11 %
Lymphs Abs: 1.8 10*3/uL (ref 0.7–4.0)
MCH: 32.3 pg (ref 26.0–34.0)
MCHC: 33.1 g/dL (ref 30.0–36.0)
MCV: 97.7 fL (ref 80.0–100.0)
Monocytes Absolute: 1.6 10*3/uL — ABNORMAL HIGH (ref 0.1–1.0)
Monocytes Relative: 10 %
Neutro Abs: 11.9 10*3/uL — ABNORMAL HIGH (ref 1.7–7.7)
Neutrophils Relative %: 76 %
Platelets: 268 10*3/uL (ref 150–400)
RBC: 4.33 MIL/uL (ref 4.22–5.81)
RDW: 13.2 % (ref 11.5–15.5)
WBC: 15.6 10*3/uL — ABNORMAL HIGH (ref 4.0–10.5)
nRBC: 0 % (ref 0.0–0.2)

## 2021-06-08 LAB — RESP PANEL BY RT-PCR (FLU A&B, COVID) ARPGX2
Influenza A by PCR: NEGATIVE
Influenza B by PCR: NEGATIVE
SARS Coronavirus 2 by RT PCR: NEGATIVE

## 2021-06-08 LAB — LACTIC ACID, PLASMA
Lactic Acid, Venous: 3.3 mmol/L (ref 0.5–1.9)
Lactic Acid, Venous: 2.1 mmol/L (ref 0.5–1.9)

## 2021-06-08 LAB — PROTIME-INR
INR: 1.3 — ABNORMAL HIGH (ref 0.8–1.2)
Prothrombin Time: 16.3 seconds — ABNORMAL HIGH (ref 11.4–15.2)

## 2021-06-08 LAB — APTT: aPTT: 30 seconds (ref 24–36)

## 2021-06-08 MED ORDER — LACTATED RINGERS IV SOLN: INTRAVENOUS | Status: DC

## 2021-06-08 MED ORDER — LACTATED RINGERS IV BOLUS (SEPSIS)
1000.0000 mL | Freq: Once | INTRAVENOUS | Status: AC
  Administered 2021-06-08: 1000 mL via INTRAVENOUS

## 2021-06-08 MED ORDER — DILTIAZEM LOAD VIA INFUSION
10.0000 mg | Freq: Once | INTRAVENOUS | Status: AC
  Administered 2021-06-08: 10 mg via INTRAVENOUS
  Filled 2021-06-08: qty 10

## 2021-06-08 MED ORDER — DILTIAZEM HCL-DEXTROSE 125-5 MG/125ML-% IV SOLN (PREMIX)
5.0000 mg/h | INTRAVENOUS | Status: DC
  Administered 2021-06-08 – 2021-06-09 (×2): 5 mg/h via INTRAVENOUS
  Administered 2021-06-09: 7.5 mg/h via INTRAVENOUS
  Administered 2021-06-10: 5 mg/h via INTRAVENOUS
  Administered 2021-06-11: 10 mg/h via INTRAVENOUS
  Filled 2021-06-08 (×4): qty 125

## 2021-06-08 MED ORDER — SODIUM CHLORIDE 0.9 % IV SOLN
2.0000 g | INTRAVENOUS | Status: DC
  Administered 2021-06-08 – 2021-06-09 (×2): 2 g via INTRAVENOUS
  Filled 2021-06-08 (×2): qty 20

## 2021-06-08 MED ORDER — LACTATED RINGERS IV BOLUS (SEPSIS)
1000.0000 mL | Freq: Once | INTRAVENOUS | Status: AC
  Administered 2021-06-09: 1000 mL via INTRAVENOUS

## 2021-06-08 MED ORDER — SODIUM CHLORIDE 0.9 % IV SOLN
500.0000 mg | INTRAVENOUS | Status: DC
  Administered 2021-06-08 – 2021-06-09 (×2): 500 mg via INTRAVENOUS
  Filled 2021-06-08 (×2): qty 5

## 2021-06-08 MED ORDER — ACETAMINOPHEN 325 MG PO TABS
650.0000 mg | ORAL_TABLET | Freq: Once | ORAL | Status: AC
  Administered 2021-06-08: 650 mg via ORAL
  Filled 2021-06-08: qty 2

## 2021-06-08 NOTE — ED Notes (Addendum)
Pt appears to have some trouble catching breath with oxygen 90-92%. Couldn't obtain accurate pulse reading. This RN told NT to take pt to room for further assessment.

## 2021-06-08 NOTE — ED Provider Notes (Signed)
[facility] EMERGENCY DEPARTMENT Provider Note   CSN: [id number] Arrival date & time: 06/08/21  1855     History  Chief Complaint  Patient presents with   Fever, Covid    [name] [name] is a 81 y.o. male.  HPI  Patient presented to the ER for evaluation of weakness, malaise fever coughing.  Patient states the symptoms started today.  Family went to check on him and the patient had not really gotten out of bed.  Usually he is up and around.  Patient states he started feeling feverish and weak today.  He has been coughing.  Family members did a home COVID test and it was reportedly positive.  His temperature at home was up to 103.  Patient also has history of atrial fibrillation and noted his heart was racing.  No vomiting or diarrhea.  No chest pain  Home Medications Prior to Admission medications   Medication Sig Start Date End Date Taking? Authorizing Provider  Calcium Carb-Cholecalciferol (CALCIUM 600+D3) 600-800 MG-UNIT TABS Take 1 tablet by mouth 2 (two) times daily. 12/26/18   [name], MD  Cholecalciferol 25 MCG (1000 UT) tablet Take by mouth.    [provider]  ferrous sulfate 325 (65 FE) MG tablet Take 325 mg by mouth daily with breakfast.    [provider]  levothyroxine (SYNTHROID) 100 MCG tablet Take 1 tablet (100 mcg total) by mouth daily before breakfast. 01/18/21   Nida, Marella Chimes, MD  metoprolol tartrate (LOPRESSOR) 100 MG tablet Take 1 tablet by mouth twice daily 05/13/21   Croitoru, Mihai, MD  Rivaroxaban (XARELTO) 15 MG TABS tablet TAKE 1 TABLET BY MOUTH ONCE DAILY WITH SUPPER 11/29/20   Croitoru, Mihai, MD  sodium bicarbonate 650 MG tablet Take 650 mg by mouth 2 (two) times daily.    [provider]  valsartan (DIOVAN) 40 MG tablet Take 40 mg by mouth daily. 04/28/21   [provider]      Allergies    No known allergies    Review of Systems   Review of Systems  Constitutional:  Positive for fever.    Physical Exam Updated Vital Signs BP 110/77   Pulse (!) 103   Temp 98.2 F (36.8 C) (Oral)   Resp (!) 24   Ht 1.88 m ('6\' 2"'$ )   Wt 122.5 kg   SpO2 96%   BMI 34.67 kg/m  Physical Exam Vitals and nursing note reviewed.  Constitutional:      Appearance: He is well-developed. He is ill-appearing.  HENT:     Head: Normocephalic and atraumatic.     Right Ear: External ear normal.     Left Ear: External ear normal.  Eyes:     General: No scleral icterus.       Right eye: No discharge.        Left eye: No discharge.     Conjunctiva/sclera: Conjunctivae normal.  Neck:     Trachea: No tracheal deviation.  Cardiovascular:     Rate and Rhythm: Tachycardia present. Rhythm irregular.  Pulmonary:     Effort: Pulmonary effort is normal. No respiratory distress.     Breath sounds: Normal breath sounds. No stridor. No wheezing or rales.  Abdominal:     General: Bowel sounds are normal. There is no distension.     Palpations: Abdomen is soft.     Tenderness: There is no abdominal tenderness. There is no guarding or rebound.  Musculoskeletal:  General: No tenderness or deformity.     Cervical back: Neck supple.  Skin:    General: Skin is warm and dry.     Findings: No rash.  Neurological:     General: No focal deficit present.     Mental Status: He is alert.     Cranial Nerves: No cranial nerve deficit (no facial droop, extraocular movements intact, no slurred speech).     Sensory: No sensory deficit.     Motor: No abnormal muscle tone or seizure activity.     Coordination: Coordination normal.  Psychiatric:        Mood and Affect: Mood normal.    ED Results / Procedures / Treatments   Labs (all labs ordered are listed, but only abnormal results are displayed) Labs Reviewed  LACTIC ACID, PLASMA - Abnormal; Notable for the following components:      Result Value   Lactic Acid, Venous 3.3 (*)    All other components within normal limits  LACTIC ACID, PLASMA - Abnormal;  Notable for the following components:   Lactic Acid, Venous 2.1 (*)    All other components within normal limits  COMPREHENSIVE METABOLIC PANEL - Abnormal; Notable for the following components:   Sodium 134 (*)    CO2 21 (*)    Glucose, Bld 204 (*)    BUN 36 (*)    Creatinine, Ser 2.54 (*)    Calcium 8.6 (*)    Total Bilirubin 1.3 (*)    GFR, Estimated 25 (*)    All other components within normal limits  CBC WITH DIFFERENTIAL/PLATELET - Abnormal; Notable for the following components:   WBC 15.6 (*)    Neutro Abs 11.9 (*)    Monocytes Absolute 1.6 (*)    Abs Immature Granulocytes 0.16 (*)    All other components within normal limits  PROTIME-INR - Abnormal; Notable for the following components:   Prothrombin Time 16.3 (*)    INR 1.3 (*)    All other components within normal limits  RESP PANEL BY RT-PCR (FLU A&B, COVID) ARPGX2  CULTURE, BLOOD (ROUTINE X 2)  CULTURE, BLOOD (ROUTINE X 2)  APTT  URINALYSIS, ROUTINE W REFLEX MICROSCOPIC    EKG EKG Interpretation  Date/Time:  Wednesday Jun 08 2021 19:30:18 EDT Ventricular Rate:  169 PR Interval:    QRS Duration: 85 QT Interval:  295 QTC Calculation: 495 R Axis:   -79 Text Interpretation: Atrial fibrillation with rapid V-rate Left anterior fascicular block Abnormal R-wave progression, late transition Repolarization abnormality, prob rate related Since last tracing rate faster Confirmed by Dorie Rank [phone]) on 06/08/2021 7:37:08 PM  Radiology DG Chest Port 1 View  Result Date: 06/08/2021 CLINICAL DATA:  Questionable sepsis - evaluate for abnormality EXAM: PORTABLE CHEST 1 VIEW.  Patient is slightly rotated. COMPARISON:  Chest x-ray 09/24/2015 FINDINGS: The heart and mediastinal contours are unchanged. Aortic calcification. Elevated right hemidiaphragm. Right base atelectasis. No focal consolidation. No pulmonary edema. No pleural effusion. No pneumothorax. No acute osseous abnormality. IMPRESSION: 1. No active disease. 2.  Aortic  Atherosclerosis (ICD10-I70.0). Electronically Signed   By: Iven Finn M.D.   On: 06/08/2021 20:08    Procedures .Critical Care Performed by: Dorie Rank, MD Authorized by: Dorie Rank, MD   Critical care provider statement:    Critical care time (minutes):  35   Critical care was time spent personally by me on the following activities:  Development of treatment plan with patient or surrogate, discussions with consultants, evaluation of patient's  response to treatment, examination of patient, ordering and review of laboratory studies, ordering and review of radiographic studies, ordering and performing treatments and interventions, pulse oximetry, re-evaluation of patient's condition and review of old charts    Medications Ordered in ED Medications  lactated ringers infusion ( Intravenous New Bag/Given 06/08/21 2131)  lactated ringers bolus 1,000 mL (0 mLs Intravenous Stopped 06/08/21 2132)    And  lactated ringers bolus 1,000 mL (0 mLs Intravenous Stopped 06/08/21 2304)    And  lactated ringers bolus 1,000 mL (1,000 mLs Intravenous New Bag/Given 06/08/21 2303)    And  lactated ringers bolus 1,000 mL (has no administration in time range)  cefTRIAXone (ROCEPHIN) 2 g in sodium chloride 0.9 % 100 mL IVPB (0 g Intravenous Stopped 06/08/21 2045)  azithromycin (ZITHROMAX) 500 mg in sodium chloride 0.9 % 250 mL IVPB (0 mg Intravenous Stopped 06/08/21 2134)  diltiazem (CARDIZEM) 1 mg/mL load via infusion 10 mg (10 mg Intravenous Bolus from Bag 06/08/21 1957)    And  diltiazem (CARDIZEM) 125 mg in dextrose 5% 125 mL (1 mg/mL) infusion (5 mg/hr Intravenous New Bag/Given 06/08/21 2000)  acetaminophen (TYLENOL) tablet 650 mg (650 mg Oral Given 06/08/21 2006)    ED Course/ Medical Decision Making/ A&P Clinical Course as of 06/08/21 2307  Wed Jun 08, 2021  2028 Lactic acid, plasma(!!) Lactic acid level elevated [JK]  2028 CBC with Differential(!) Leukocytosis noted [JK]  2029 Comprehensive metabolic  panel(!) Creatinine increased compared to previous values [JK]  2029 Blood pressure was 086 systolic at the bedside earlier [JK]  2101 BP improved to the 90s [JK]  2107 DG Chest Port 1 View Chest x-ray images and radiology report reviewed, no acute pneumonia noted [JK]  2135 Resp Panel by RT-PCR (Flu A&B, Covid) Anterior Nasal Swab COVID and flu negative [JK]  2136 Blood pressure has improved and heart rate is decreasing with treatment [JK]  2306 Case discussed with Dr. Josephine Cables [JK]    Clinical Course User Index [JK] Dorie Rank, MD                           Medical Decision Making Problems Addressed: AKI (acute kidney injury) [facility]): acute illness or injury Atrial fibrillation, rapid [facility]): acute illness or injury that poses a threat to life or bodily functions Sepsis, due to unspecified organism, unspecified whether acute organ dysfunction present [facility]): acute illness or injury that poses a threat to life or bodily functions  Amount and/or Complexity of Data Reviewed External Data Reviewed: notes.    Details: Cardiology notes reviewed Labs: ordered. Decision-making details documented in ED Course. Radiology: ordered and independent interpretation performed. Decision-making details documented in ED Course. ECG/medicine tests: ordered.  Risk OTC drugs. Prescription drug management. Decision regarding hospitalization.   Patient presented to ED with complaints of weakness fever at home up to 103.  On arrival in the ED the patient was noted to be hypotensive with a low-grade temperature.  He was also noted to be tachycardic and A-fib with rapid rate.  Patient does have history of chronic A-fib and per the notes patient is chronically in A-fib and not in sinus rhythm.  Patient was tachycardic on arrival.  He was started on a Cardizem infusion.  Patient did have hypotension initially but with fluid resuscitation his blood pressure has improved and his heart rate has decreased.  No clear  source of infection at this time.  No definite pneumonia noted on chest x-ray  although his right hemidiaphragm is elevated and it is possible there could be a pneumonia not quite evident on chest x-ray yet.  COVID is negative here although he did have a positive test at home reportedly.  Urinalysis is still pending.  Patient was started on broad-spectrum antibiotics.  Consult the medical service for admission and further treatment        Final Clinical Impression(s) / ED Diagnoses Final diagnoses:  Sepsis, due to unspecified organism, unspecified whether acute organ dysfunction present [facility])  Atrial fibrillation, rapid ([place])  AKI (acute kidney injury) [facility])    Rx / DC Orders ED Discharge Orders     None         Dorie Rank, MD 06/08/21 2307

## 2021-06-08 NOTE — ED Triage Notes (Signed)
Pt presents with fever and abdominal pain that started today. Temp of 103 at home and took at home covid test and was positive. Endorses SOB, cough and weakness.

## 2021-06-08 NOTE — Sepsis Progress Note (Signed)
Following for sepsis monitoring ?

## 2021-06-08 NOTE — H&P (Signed)
History and Physical    Patient: [name] [name] LEX:[id number] DOB: [birth year] DOA: 06/08/2021 DOS: the patient was seen and examined on 06/09/2021 PCP: [name], MD  Patient coming from: Home  Chief Complaint:  Chief Complaint  Patient presents with   Fever   HPI: [name] [name] is a 81 y.o. male with medical history significant of atrial fibrillation, CKD stage IIIb, prostate cancer with bone metastasis, hypothyroidism, hypertension who presents to the emergency department due to sudden onset of weakness and malaise this afternoon.  Patient states that he was in his normal state of health this morning and was without any complaints, but in the afternoon, around 3-4 PM, he suddenly felt weak and febrile and developed a nonproductive cough, so he went to bed.  Son checked on him and saw that he felt sick, daughter-in-law (who was a Marine scientist) checked his temperature and it was 103.67F, so EMS was activated and patient was sent to the ED for further evaluation and management.  He denies chest pain, headache, vomiting, nausea, abdominal pain, diarrhea or constipation.  ED Course:  In the emergency department, he was tachypneic, tachycardic, BP was 83/59, temperature was 100.35F and O2 sat was 92%.  Work-up in the ED showed normal CBC except leukocytosis.  BUN/creatinine 36/2.54 (baseline creatinine at 2.0) and hyperglycemia.  Lactic acid 3.3 > 2.1, urinalysis was positive for large leukocytes and many bacteria.  Influenza A, B, SARS coronavirus 2 was negative. Chest x-ray shows no active disease Tylenol was given, patient was started on IV ceftriaxone and azithromycin due to presumed CAP, IV Cardizem drip was started due to patient being in paroxysmal A-fib and IV hydration was provided per sepsis protocol.  Hospitalist was asked to admit patient for further evaluation and management.  Review of Systems: Review of systems as noted in the HPI. All other systems reviewed and are  negative.   Past Medical History:  Diagnosis Date   Cardiomyopathy [facility])    h/o felt 2nd atrial fib, EF normal by echo 2013   CHF (congestive heart failure) (HCC)    Chronic anticoagulation    Chronic atrial fibrillation (HCC)    Chronic kidney disease (CKD), stage IV (severe) (HCC)    Dysrhythmia    Hypothyroidism    Pneumonia    Pre-diabetes    Primary localized osteoarthritis of left hip 05/02/2016   Prostate cancer ([place])    Shortness of breath    Systemic hypertension    Past Surgical History:  Procedure Laterality Date   CARDIOVERSION     CATARACT EXTRACTION W/PHACO  04/17/2011   Procedure: CATARACT EXTRACTION PHACO AND INTRAOCULAR LENS PLACEMENT ([place]);  Surgeon: [name], MD;  Location: [facility];  Service: Ophthalmology;  Laterality: Left;  CDE:14.58   CATARACT EXTRACTION W/PHACO  05/22/2011   Procedure: CATARACT EXTRACTION PHACO AND INTRAOCULAR LENS PLACEMENT ([facility]);  Surgeon: [name], MD;  Location: [facility];  Service: Ophthalmology;  Laterality: Right;  CDE 13.02   COLONOSCOPY  01/17/2011   Procedure: COLONOSCOPY;  Surgeon: Jamesetta So;  Location: [facility];  Service: Gastroenterology;  Laterality: N/A;   COLONOSCOPY N/A 02/24/2016   Procedure: COLONOSCOPY;  Surgeon: Rogene Houston, MD;  Location: [facility];  Service: Endoscopy;  Laterality: N/A;  12:50   INGUINAL HERNIA REPAIR Right 09/27/2015   Procedure: RIGHT INGUINAL HERNIORRHAPY WITH MESH SMALL BOWEL RESECTION;  Surgeon: Aviva Signs, MD;  Location: [facility];  Service: General;  Laterality: Right;   NM MYOCAR [place]  02/2009   dipyridamole; mild perfusion defect due to infarct/scar with mild perinfarct ischemia is apical septal, apical basal inferoseptal, basal inferior, mid inferoseptal mid inferior & apical inferior; EKG negative for ischemia; low risk scan   PROSTATE SURGERY     7 years ago [facility] Dr Javaid-prostatectomy   PROSTATECTOMY     Sleep Study  05/2011   increased upper airway resistance  syndrome with mild sleep apnea during REM   TOTAL HIP ARTHROPLASTY Left 05/02/2016   Procedure: LEFT TOTAL HIP ARTHROPLASTY;  Surgeon: Marchia Bond, MD;  Location: [place];  Service: Orthopedics;  Laterality: Left;   TRANSTHORACIC ECHOCARDIOGRAM  04/2011   EF=50-55%; RV mildly dilated; LA mild-mod dilated; mild mitral valve annular calcif, mild MR; mod TR, RVSP elevated 30-42mHg, mild pulm HTN; AV mildly sclerotic, mild calcif of AV leaflets; trace pulm valve regurg     Social History:  reports that he has never smoked. He has never used smokeless tobacco. He reports that he does not drink alcohol and does not use drugs.   Allergies  Allergen Reactions   No Known Allergies     Family History  Problem Relation Age of Onset   Diabetes Mother    Prostate cancer Father    Anesthesia problems Neg Hx    Hypotension Neg Hx    Malignant hyperthermia Neg Hx    Pseudochol deficiency Neg Hx      Prior to Admission medications   Medication Sig Start Date End Date Taking? Authorizing Provider  Calcium Carb-Cholecalciferol (CALCIUM 600+D3) 600-800 MG-UNIT TABS Take 1 tablet by mouth 2 (two) times daily. 12/26/18   NCassandria Anger MD  Cholecalciferol 25 MCG (1000 UT) tablet Take by mouth.    [provider]  ferrous sulfate 325 (65 FE) MG tablet Take 325 mg by mouth daily with breakfast.    [provider]  levothyroxine (SYNTHROID) 100 MCG tablet Take 1 tablet (100 mcg total) by mouth daily before breakfast. 01/18/21   Nida, GMarella Chimes MD  metoprolol tartrate (LOPRESSOR) 100 MG tablet Take 1 tablet by mouth twice daily 05/13/21   Croitoru, Mihai, MD  Rivaroxaban (XARELTO) 15 MG TABS tablet TAKE 1 TABLET BY MOUTH ONCE DAILY WITH SUPPER 11/29/20   Croitoru, Mihai, MD  sodium bicarbonate 650 MG tablet Take 650 mg by mouth 2 (two) times daily.    [provider]  valsartan (DIOVAN) 40 MG tablet Take 40 mg by mouth daily. 04/28/21   [provider]     Physical Exam: BP 119/83   Pulse (!) 106   Temp 98.2 F (36.8 C) (Oral)   Resp (!) 28   Ht 6' 2"  (1.88 m)   Wt 122.5 kg   SpO2 96%   BMI 34.67 kg/m   General: 81y.o. year-old male ill appearing, but in no acute distress.  Alert and oriented x3. HEENT: NCAT, EOMI Neck: Supple, trachea medial Cardiovascular: Tachycardia, irregular rate and rhythm with no rubs or gallops.  No thyromegaly or JVD noted.  No lower extremity edema. 2/4 pulses in all 4 extremities. Respiratory: Clear to auscultation with no wheezes or rales. Good inspiratory effort. Abdomen: Soft, nontender nondistended with normal bowel sounds x4 quadrants. Muskuloskeletal: No cyanosis, clubbing or edema noted bilaterally Neuro: CN II-XII intact, strength 5/5 x 4, sensation, reflexes intact Skin: No ulcerative lesions noted or rashes Psychiatry: Judgement and insight appear normal. Mood is appropriate for condition and setting          Labs on Admission:  Basic Metabolic Panel: Recent Labs  Lab 06/08/21 1936  NA 134*  K 4.8  CL 103  CO2 21*  GLUCOSE 204*  BUN 36*  CREATININE 2.54*  CALCIUM 8.6*   Liver Function Tests: Recent Labs  Lab 06/08/21 1936  AST 20  ALT 19  ALKPHOS 44  BILITOT 1.3*  PROT 8.0  ALBUMIN 3.7   No results for input(s): LIPASE, AMYLASE in the last 168 hours. No results for input(s): AMMONIA in the last 168 hours. CBC: Recent Labs  Lab 06/08/21 1936  WBC 15.6*  NEUTROABS 11.9*  HGB 14.0  HCT 42.3  MCV 97.7  PLT 268   Cardiac Enzymes: No results for input(s): CKTOTAL, CKMB, CKMBINDEX, TROPONINI in the last 168 hours.  BNP (last 3 results) No results for input(s): BNP in the last 8760 hours.  ProBNP (last 3 results) No results for input(s): PROBNP in the last 8760 hours.  CBG: No results for input(s): GLUCAP in the last 168 hours.  Radiological Exams on Admission: DG Chest Port 1 View  Result Date: 06/08/2021 CLINICAL DATA:  Questionable sepsis - evaluate  for abnormality EXAM: PORTABLE CHEST 1 VIEW.  Patient is slightly rotated. COMPARISON:  Chest x-ray 09/24/2015 FINDINGS: The heart and mediastinal contours are unchanged. Aortic calcification. Elevated right hemidiaphragm. Right base atelectasis. No focal consolidation. No pulmonary edema. No pleural effusion. No pneumothorax. No acute osseous abnormality. IMPRESSION: 1. No active disease. 2.  Aortic Atherosclerosis (ICD10-I70.0). Electronically Signed   By: Iven Finn M.D.   On: 06/08/2021 20:08    EKG: I independently viewed the EKG done and my findings are as followed: A-fib with RVR.  LAFB, repolarization abnormality and QTc 495 ms  Assessment/Plan Present on Admission:  SIRS (systemic inflammatory response syndrome) (HCC)  Hypothyroidism  Essential hypertension  Principal Problem:   SIRS (systemic inflammatory response syndrome) (HCC) Active Problems:   Essential hypertension   Acute kidney injury superimposed on chronic kidney disease (HCC)   Hypothyroidism   Leukocytosis   Paroxysmal atrial fibrillation with RVR (HCC)   Lactic acidosis   SIRS with suspicion of sepsis due to presumed CAP POA Patient was tachypneic, tachycardic, WBC was elevated (met SIRS criteria).  Patient was presumed to have CAP, though, this may just be an acute viral syndrome Patient was started on ceftriaxone and azithromycin, we shall continue same at this time with plan to de-escalate/discontinue based on blood culture, sputum culture, urine Legionella, strep pneumo and procalcitonin Continue Tylenol as needed Continue Mucinex, incentive spirometry, flutter valve   Leukocytosis possible secondary to above WBC 15.6, continue treatment as described above  Lactic acidosis possibly secondary to multifactorial Lactic acid 3.3 > 2.1, continue IV hydration Continue to trend lactic acid  Paroxysmal A-fib with RVR Continue IV Cardizem drip with plan to transition to home Lopressor when HR is better  controlled Continue Xarelto  Acute kidney injury on CKD stage IV BUN/creatinine 36/2.54 (baseline creatinine at 2.0) Continue gentle hydration Renally adjust medications, avoid nephrotoxic agents/dehydration/hypotension  Hyperglycemia possibly reactive CBG 204, patient has a history of T2DM Continue to monitor CBG and consider checking hemoglobin A1c if CBG continues to stay elevated  Prolonged QTc (495 ms) Avoid QT prolonging drugs Magnesium level will be checked Continue telemetry  Hypothyroidism Continue Synthroid  Essential hypertension Continue IV Cardizem drip at this time and consider to transition to home valsartan and Lopressor when patient is weaned off the drip.   DVT prophylaxis: Xarelto   Advance Care Planning:   Code Status: Full  Code   Consults: None  Family Communication: None at bedside  Severity of Illness: The appropriate patient status for this patient is INPATIENT. Inpatient status is judged to be reasonable and necessary in order to provide the required intensity of service to ensure the patient's safety. The patient's presenting symptoms, physical exam findings, and initial radiographic and laboratory data in the context of their chronic comorbidities is felt to place them at high risk for further clinical deterioration. Furthermore, it is not anticipated that the patient will be medically stable for discharge from the hospital within 2 midnights of admission.   * I certify that at the point of admission it is my clinical judgment that the patient will require inpatient hospital care spanning beyond 2 midnights from the point of admission due to high intensity of service, high risk for further deterioration and high frequency of surveillance required.*  Author: Bernadette Hoit, DO 06/09/2021 3:05 AM  For on call review [email or web address].

## 2021-06-09 ENCOUNTER — Telehealth: Payer: Self-pay

## 2021-06-09 DIAGNOSIS — R6510 Systemic inflammatory response syndrome (SIRS) of non-infectious origin without acute organ dysfunction: Secondary | ICD-10-CM | POA: Diagnosis not present

## 2021-06-09 DIAGNOSIS — E872 Acidosis, unspecified: Secondary | ICD-10-CM

## 2021-06-09 DIAGNOSIS — I480 Paroxysmal atrial fibrillation: Secondary | ICD-10-CM

## 2021-06-09 DIAGNOSIS — D72829 Elevated white blood cell count, unspecified: Secondary | ICD-10-CM

## 2021-06-09 LAB — CBC
HCT: 36.8 % — ABNORMAL LOW (ref 39.0–52.0)
Hemoglobin: 12.3 g/dL — ABNORMAL LOW (ref 13.0–17.0)
MCH: 33.2 pg (ref 26.0–34.0)
MCHC: 33.4 g/dL (ref 30.0–36.0)
MCV: 99.5 fL (ref 80.0–100.0)
Platelets: 222 10*3/uL (ref 150–400)
RBC: 3.7 MIL/uL — ABNORMAL LOW (ref 4.22–5.81)
RDW: 13.3 % (ref 11.5–15.5)
WBC: 14 10*3/uL — ABNORMAL HIGH (ref 4.0–10.5)
nRBC: 0 % (ref 0.0–0.2)

## 2021-06-09 LAB — COMPREHENSIVE METABOLIC PANEL
ALT: 18 U/L (ref 0–44)
AST: 30 U/L (ref 15–41)
Albumin: 3.1 g/dL — ABNORMAL LOW (ref 3.5–5.0)
Alkaline Phosphatase: 36 U/L — ABNORMAL LOW (ref 38–126)
Anion gap: 7 (ref 5–15)
BUN: 34 mg/dL — ABNORMAL HIGH (ref 8–23)
CO2: 24 mmol/L (ref 22–32)
Calcium: 7.9 mg/dL — ABNORMAL LOW (ref 8.9–10.3)
Chloride: 105 mmol/L (ref 98–111)
Creatinine, Ser: 2.28 mg/dL — ABNORMAL HIGH (ref 0.61–1.24)
GFR, Estimated: 28 mL/min — ABNORMAL LOW (ref >60)
Glucose, Bld: 139 mg/dL — ABNORMAL HIGH (ref 70–99)
Potassium: 4.1 mmol/L (ref 3.5–5.1)
Sodium: 136 mmol/L (ref 135–145)
Total Bilirubin: 0.9 mg/dL (ref 0.3–1.2)
Total Protein: 6.8 g/dL (ref 6.5–8.1)

## 2021-06-09 LAB — LACTIC ACID, PLASMA
Lactic Acid, Venous: 1.9 mmol/L (ref 0.5–1.9)
Lactic Acid, Venous: 1.4 mmol/L (ref 0.5–1.9)

## 2021-06-09 LAB — GLUCOSE, CAPILLARY: Glucose-Capillary: 204 mg/dL — ABNORMAL HIGH (ref 70–99)

## 2021-06-09 LAB — MAGNESIUM: Magnesium: 1.6 mg/dL — ABNORMAL LOW (ref 1.7–2.4)

## 2021-06-09 LAB — PROCALCITONIN: Procalcitonin: 0.6 ng/mL

## 2021-06-09 LAB — MRSA NEXT GEN BY PCR, NASAL: MRSA by PCR Next Gen: NOT DETECTED

## 2021-06-09 LAB — PHOSPHORUS: Phosphorus: 4 mg/dL (ref 2.5–4.6)

## 2021-06-09 MED ORDER — ONDANSETRON HCL 4 MG PO TABS: 4.0000 mg | ORAL_TABLET | Freq: Four times a day (QID) | ORAL | Status: DC | PRN

## 2021-06-09 MED ORDER — DM-GUAIFENESIN ER 30-600 MG PO TB12
1.0000 | ORAL_TABLET | Freq: Two times a day (BID) | ORAL | Status: DC
  Administered 2021-06-09 – 2021-06-18 (×19): 1 via ORAL
  Filled 2021-06-09 (×19): qty 1

## 2021-06-09 MED ORDER — LEVOTHYROXINE SODIUM 50 MCG PO TABS: 100.0000 ug | ORAL_TABLET | Freq: Every day | ORAL | Status: DC

## 2021-06-09 MED ORDER — ACETAMINOPHEN 325 MG PO TABS
650.0000 mg | ORAL_TABLET | Freq: Four times a day (QID) | ORAL | Status: DC | PRN
  Administered 2021-06-09: 650 mg via ORAL
  Filled 2021-06-09: qty 2

## 2021-06-09 MED ORDER — MELATONIN 3 MG PO TABS
6.0000 mg | ORAL_TABLET | Freq: Once | ORAL | Status: AC
  Administered 2021-06-09: 6 mg via ORAL
  Filled 2021-06-09: qty 2

## 2021-06-09 MED ORDER — MAGNESIUM SULFATE 2 GM/50ML IV SOLN
2.0000 g | Freq: Once | INTRAVENOUS | Status: AC
Start: 2021-06-09 — End: 2021-06-09
  Administered 2021-06-09: 2 g via INTRAVENOUS
  Filled 2021-06-09: qty 50

## 2021-06-09 MED ORDER — CHLORHEXIDINE GLUCONATE CLOTH 2 % EX PADS
6.0000 | MEDICATED_PAD | Freq: Every day | CUTANEOUS | Status: DC
  Administered 2021-06-09 – 2021-06-18 (×10): 6 via TOPICAL

## 2021-06-09 MED ORDER — METOPROLOL TARTRATE 50 MG PO TABS
100.0000 mg | ORAL_TABLET | Freq: Two times a day (BID) | ORAL | Status: DC
  Administered 2021-06-09 (×2): 100 mg via ORAL
  Filled 2021-06-09 (×2): qty 2

## 2021-06-09 MED ORDER — ACETAMINOPHEN 650 MG RE SUPP: 650.0000 mg | Freq: Four times a day (QID) | RECTAL | Status: DC | PRN

## 2021-06-09 MED ORDER — LACTATED RINGERS IV SOLN: INTRAVENOUS | Status: AC

## 2021-06-09 MED ORDER — LEVOTHYROXINE SODIUM 100 MCG PO TABS
100.0000 ug | ORAL_TABLET | Freq: Every day | ORAL | Status: DC
  Administered 2021-06-09 – 2021-06-18 (×10): 100 ug via ORAL
  Filled 2021-06-09: qty 1
  Filled 2021-06-09: qty 2
  Filled 2021-06-09 (×8): qty 1

## 2021-06-09 MED ORDER — TRAZODONE HCL 50 MG PO TABS
50.0000 mg | ORAL_TABLET | Freq: Every day | ORAL | Status: DC
  Administered 2021-06-09 – 2021-06-16 (×8): 50 mg via ORAL
  Filled 2021-06-09 (×9): qty 1

## 2021-06-09 MED ORDER — ONDANSETRON HCL 4 MG/2ML IJ SOLN: 4.0000 mg | Freq: Four times a day (QID) | INTRAMUSCULAR | Status: DC | PRN

## 2021-06-09 MED ORDER — RIVAROXABAN 15 MG PO TABS
15.0000 mg | ORAL_TABLET | Freq: Every day | ORAL | Status: DC
  Administered 2021-06-09 – 2021-06-17 (×9): 15 mg via ORAL
  Filled 2021-06-09 (×9): qty 1

## 2021-06-09 NOTE — Progress Notes (Signed)
  Transition of Care [facility]) Screening Note   Patient Details  Name: [name] [name] Date of Birth: 09-07-1940   Transition of Care [facility]) CM/SW Contact:    Ihor Gully, LCSW Phone Number: 06/09/2021, 2:21 PM    Transition of Care Department [facility]) has reviewed patient and no TOC needs have been identified at this time. We will continue to monitor patient advancement through interdisciplinary progression rounds. If new patient transition needs arise, please place a TOC consult.

## 2021-06-09 NOTE — Progress Notes (Signed)
PROGRESS NOTE    [name] [name]  KGM:[id number] DOB: [birth year] DOA: 06/08/2021 PCP: [name], MD   Brief Narrative:    [name] [name] is a 81 y.o. male with medical history significant of atrial fibrillation, CKD stage IIIb, prostate cancer with bone metastasis, hypothyroidism, hypertension who presents to the emergency department due to sudden onset of weakness and malaise this afternoon.  He was admitted with concerns for sepsis and appears to be having severe sepsis secondary to UTI and possible Community-acquired pneumonia and has been started on Rocephin and azithromycin.  He is also noted to have atrial fibrillation with RVR and is currently on Cardizem drip.  Assessment & Plan:   Principal Problem:   SIRS (systemic inflammatory response syndrome) (HCC) Active Problems:   Essential hypertension   Acute kidney injury superimposed on chronic kidney disease (HCC)   Hypothyroidism   Leukocytosis   Paroxysmal atrial fibrillation with RVR (HCC)   Lactic acidosis  Assessment and Plan:  Severe sepsis secondary to UTI with possible CAP, present on admission Blood cultures with no growth thus far and urine cultures ordered after initial antibiotics Patient was started on ceftriaxone and azithromycin, we shall continue same at this time with plan to de-escalate/discontinue based on blood culture, sputum culture, urine Legionella, strep pneumo and procalcitonin Continue Tylenol as needed Continue Mucinex, incentive spirometry, flutter valve    Leukocytosis possible secondary to above Continue to follow, downtrending  Lactic acidosis possibly secondary to multifactorial Lactic acid 3.3 > 2.1, continue IV hydration Continue to trend lactic acid  Paroxysmal A-fib with RVR Continue IV Cardizem drip  Home Lopressor initiated Continue Xarelto  Acute kidney injury on CKD stage IV BUN/creatinine 36/2.54 on admission (baseline creatinine at 2.0), downward trending Continue  gentle hydration Renally adjust medications, avoid nephrotoxic agents/dehydration/hypotension  Hyperglycemia possibly reactive CBG 204, patient has a history of T2DM Continue to monitor  Hypomagnesemia Replete and reevaluate in a.m.   Hypothyroidism Continue Synthroid  Essential hypertension Continue IV Cardizem drip at this time and consider to transition to home valsartan and Lopressor when patient is weaned off the drip.    DVT prophylaxis: Xarelto Code Status: Full Family Communication: None at bedside Disposition Plan:  Status is: Inpatient Remains inpatient appropriate because: IV medications  Consultants:  None  Procedures:  None  Antimicrobials:  Anti-infectives (From admission, onward)    Start     Dose/Rate Route Frequency Ordered Stop   06/08/21 2000  cefTRIAXone (ROCEPHIN) 2 g in sodium chloride 0.9 % 100 mL IVPB        2 g 200 mL/hr over 30 Minutes Intravenous Every 24 hours 06/08/21 1946 06/13/21 1959   06/08/21 2000  azithromycin (ZITHROMAX) 500 mg in sodium chloride 0.9 % 250 mL IVPB        500 mg 250 mL/hr over 60 Minutes Intravenous Every 24 hours 06/08/21 1946 06/13/21 1959       Subjective: Patient seen and evaluated today with no new acute complaints or concerns. No acute concerns or events noted overnight.  He continues to have some elevated heart rates requiring Cardizem drip.  Objective: Vitals:   06/09/21 1000 06/09/21 1031 06/09/21 1040 06/09/21 1100  BP:  122/76  (!) 151/130  Pulse:  (!) 47 (!) 101 95  Resp:  (!) 36 (!) 34 (!) 38  Temp:  (!) 102.7 F (39.3 C)    TempSrc:  Oral    SpO2:  (!) 78% 99% 97%  Weight: 128.8 kg  Height: '6\' 2"'$  (1.88 m)       Intake/Output Summary (Last 24 hours) at 06/09/2021 1231 Last data filed at 06/09/2021 1157 Gross per 24 hour  Intake 493.77 ml  Output --  Net 493.77 ml   Filed Weights   06/08/21 1917 06/09/21 1000  Weight: 122.5 kg 128.8 kg    Examination:  General exam: Appears calm  and comfortable  Respiratory system: Clear to auscultation. Respiratory effort normal.  Currently on 2 L nasal cannula Cardiovascular system: S1 & S2 heard, irregular and tachycardic Gastrointestinal system: Abdomen is soft Central nervous system: Alert and awake Extremities: No edema Skin: No significant lesions noted Psychiatry: Flat affect.    Data Reviewed: I have personally reviewed following labs and imaging studies  CBC: Recent Labs  Lab 06/08/21 1936 06/09/21 0313  WBC 15.6* 14.0*  NEUTROABS 11.9*  --   HGB 14.0 12.3*  HCT 42.3 36.8*  MCV 97.7 99.5  PLT 268 315   Basic Metabolic Panel: Recent Labs  Lab 06/08/21 1936 06/09/21 0313  NA 134* 136  K 4.8 4.1  CL 103 105  CO2 21* 24  GLUCOSE 204* 139*  BUN 36* 34*  CREATININE 2.54* 2.28*  CALCIUM 8.6* 7.9*  MG  --  1.6*  PHOS  --  4.0   GFR: Estimated Creatinine Clearance: 36.2 mL/min (A) (by C-G formula based on SCr of 2.28 mg/dL (H)). Liver Function Tests: Recent Labs  Lab 06/08/21 1936 06/09/21 0313  AST 20 30  ALT 19 18  ALKPHOS 44 36*  BILITOT 1.3* 0.9  PROT 8.0 6.8  ALBUMIN 3.7 3.1*   No results for input(s): LIPASE, AMYLASE in the last 168 hours. No results for input(s): AMMONIA in the last 168 hours. Coagulation Profile: Recent Labs  Lab 06/08/21 1936  INR 1.3*   Cardiac Enzymes: No results for input(s): CKTOTAL, CKMB, CKMBINDEX, TROPONINI in the last 168 hours. BNP (last 3 results) No results for input(s): PROBNP in the last 8760 hours. HbA1C: No results for input(s): HGBA1C in the last 72 hours. CBG: Recent Labs  Lab 06/09/21 1015  GLUCAP 204*   Lipid Profile: No results for input(s): CHOL, HDL, LDLCALC, TRIG, CHOLHDL, LDLDIRECT in the last 72 hours. Thyroid Function Tests: No results for input(s): TSH, T4TOTAL, FREET4, T3FREE, THYROIDAB in the last 72 hours. Anemia Panel: No results for input(s): VITAMINB12, FOLATE, FERRITIN, TIBC, IRON, RETICCTPCT in the last 72  hours. Sepsis Labs: Recent Labs  Lab 06/08/21 1936 06/08/21 2127 06/09/21 0313 06/09/21 0555  PROCALCITON  --   --  0.60  --   LATICACIDVEN 3.3* 2.1* 1.9 1.4    Recent Results (from the past 240 hour(s))  Blood Culture (routine x 2)     Status: None (Preliminary result)   Collection Time: 06/08/21  7:36 PM   Specimen: BLOOD  Result Value Ref Range Status   Specimen Description BLOOD RIGHT ANTECUBITAL  Final   Special Requests   Final    BOTTLES DRAWN AEROBIC AND ANAEROBIC Blood Culture adequate volume   Culture   Final    NO GROWTH < 12 HOURS Performed at [facility], [address], [place], [place] [address]    Report Status PENDING  Incomplete  Blood Culture (routine x 2)     Status: None (Preliminary result)   Collection Time: 06/08/21  7:48 PM   Specimen: BLOOD  Result Value Ref Range Status   Specimen Description BLOOD BLOOD LEFT HAND  Final   Special Requests   Final  BOTTLES DRAWN AEROBIC AND ANAEROBIC Blood Culture results may not be optimal due to an inadequate volume of blood received in culture bottles   Culture   Final    NO GROWTH < 12 HOURS Performed at [facility], [address] [place], [place] [address]    Report Status PENDING  Incomplete  Resp Panel by RT-PCR (Flu A&B, Covid) Anterior Nasal Swab     Status: None   Collection Time: 06/08/21  8:10 PM   Specimen: Anterior Nasal Swab  Result Value Ref Range Status   SARS Coronavirus 2 by RT PCR NEGATIVE NEGATIVE Final    Comment: (NOTE) SARS-CoV-2 target nucleic acids are NOT DETECTED.  The SARS-CoV-2 RNA is generally detectable in upper respiratory specimens during the acute phase of infection. The lowest concentration of SARS-CoV-2 viral copies this assay can detect is 138 copies/mL. A negative result does not preclude SARS-Cov-2 infection and should not be used as the sole basis for treatment or other patient management decisions. A negative result may occur with  improper specimen  collection/handling, submission of specimen other than nasopharyngeal swab, presence of viral mutation(s) within the areas targeted by this assay, and inadequate number of viral copies(<138 copies/mL). A negative result must be combined with clinical observations, patient history, and epidemiological information. The expected result is Negative.  Fact Sheet for Patients:  EntrepreneurPulse.com.au  Fact Sheet for Healthcare Providers:  IncredibleEmployment.be  This test is no t yet approved or cleared by the [place] FDA and  has been authorized for detection and/or diagnosis of SARS-CoV-2 by FDA under an Emergency Use Authorization (EUA). This EUA will remain  in effect (meaning this test can be used) for the duration of the COVID-19 declaration under Section 564(b)(1) of the Act, 21 U.S.C.section 360bbb-3(b)(1), unless the authorization is terminated  or revoked sooner.       Influenza A by PCR NEGATIVE NEGATIVE Final   Influenza B by PCR NEGATIVE NEGATIVE Final    Comment: (NOTE) The Xpert Xpress SARS-CoV-2/FLU/RSV plus assay is intended as an aid in the diagnosis of influenza from Nasopharyngeal swab specimens and should not be used as a sole basis for treatment. Nasal washings and aspirates are unacceptable for Xpert Xpress SARS-CoV-2/FLU/RSV testing.  Fact Sheet for Patients: EntrepreneurPulse.com.au  Fact Sheet for Healthcare Providers: IncredibleEmployment.be  This test is not yet approved or cleared by the [place] FDA and has been authorized for detection and/or diagnosis of SARS-CoV-2 by FDA under an Emergency Use Authorization (EUA). This EUA will remain in effect (meaning this test can be used) for the duration of the COVID-19 declaration under Section 564(b)(1) of the Act, 21 U.S.C. section 360bbb-3(b)(1), unless the authorization is terminated or revoked.  Performed at [facility], [address], [place], [place] [address]          Radiology Studies: [facility] Chest [facility] 1 View  Result Date: 06/08/2021 CLINICAL DATA:  Questionable sepsis - evaluate for abnormality EXAM: PORTABLE CHEST 1 VIEW.  Patient is slightly rotated. COMPARISON:  Chest x-ray 09/24/2015 FINDINGS: The heart and mediastinal contours are unchanged. Aortic calcification. Elevated right hemidiaphragm. Right base atelectasis. No focal consolidation. No pulmonary edema. No pleural effusion. No pneumothorax. No acute osseous abnormality. IMPRESSION: 1. No active disease. 2.  Aortic Atherosclerosis (ICD10-I70.0). Electronically Signed   By: Iven Finn M.D.   On: 06/08/2021 20:08        Scheduled Meds:  Chlorhexidine Gluconate Cloth  6 each Topical Daily   dextromethorphan-guaiFENesin  1 tablet Oral  BID   levothyroxine  100 mcg Oral Q0600   metoprolol tartrate  100 mg Oral BID   Rivaroxaban  15 mg Oral Q supper   Continuous Infusions:  azithromycin Stopped (06/08/21 2104)   cefTRIAXone (ROCEPHIN)  IV Stopped (06/08/21 2035)   diltiazem (CARDIZEM) infusion 7.5 mg/hr (06/09/21 1157)   lactated ringers 150 mL/hr at 06/09/21 0559     LOS: 1 day    Time spent: 35 minutes    Perris Tripathi D Manuella Ghazi, DO [facility]  If 7PM-7AM, please contact night-coverage [email or web address] 06/09/2021, 12:31 PM

## 2021-06-09 NOTE — Telephone Encounter (Signed)
Patients son called to inform us that pt is in the ICU with kidney infection and UTI.  Son is concerned he will not make his upcoming apt on 06/06.  Informed him to reach out to [place] on 06/05 to see if patient has been discharged from hospital and is up to coming to out for his injection.  Son agreed to call Monday to confirm his apt on 06/06.

## 2021-06-09 NOTE — Plan of Care (Signed)

## 2021-06-10 ENCOUNTER — Inpatient Hospital Stay (HOSPITAL_COMMUNITY): Payer: PPO

## 2021-06-10 DIAGNOSIS — R6510 Systemic inflammatory response syndrome (SIRS) of non-infectious origin without acute organ dysfunction: Secondary | ICD-10-CM | POA: Diagnosis not present

## 2021-06-10 LAB — BASIC METABOLIC PANEL
Anion gap: 7 (ref 5–15)
BUN: 33 mg/dL — ABNORMAL HIGH (ref 8–23)
CO2: 23 mmol/L (ref 22–32)
Calcium: 7.9 mg/dL — ABNORMAL LOW (ref 8.9–10.3)
Chloride: 106 mmol/L (ref 98–111)
Creatinine, Ser: 2.19 mg/dL — ABNORMAL HIGH (ref 0.61–1.24)
GFR, Estimated: 30 mL/min — ABNORMAL LOW (ref >60)
Glucose, Bld: 180 mg/dL — ABNORMAL HIGH (ref 70–99)
Potassium: 5.1 mmol/L (ref 3.5–5.1)
Sodium: 136 mmol/L (ref 135–145)

## 2021-06-10 LAB — MAGNESIUM: Magnesium: 1.9 mg/dL (ref 1.7–2.4)

## 2021-06-10 LAB — CBC
HCT: 35.6 % — ABNORMAL LOW (ref 39.0–52.0)
Hemoglobin: 11.5 g/dL — ABNORMAL LOW (ref 13.0–17.0)
MCH: 32.8 pg (ref 26.0–34.0)
MCHC: 32.3 g/dL (ref 30.0–36.0)
MCV: 101.4 fL — ABNORMAL HIGH (ref 80.0–100.0)
Platelets: 218 10*3/uL (ref 150–400)
RBC: 3.51 MIL/uL — ABNORMAL LOW (ref 4.22–5.81)
RDW: 13.7 % (ref 11.5–15.5)
WBC: 17.8 10*3/uL — ABNORMAL HIGH (ref 4.0–10.5)
nRBC: 0 % (ref 0.0–0.2)

## 2021-06-10 LAB — PROCALCITONIN: Procalcitonin: 4.94 ng/mL

## 2021-06-10 MED ORDER — LEVALBUTEROL HCL 0.63 MG/3ML IN NEBU
0.6300 mg | INHALATION_SOLUTION | Freq: Four times a day (QID) | RESPIRATORY_TRACT | Status: DC | PRN
Start: 2021-06-10 — End: 2021-06-18
  Administered 2021-06-11 – 2021-06-16 (×2): 0.63 mg via RESPIRATORY_TRACT
  Filled 2021-06-10 (×2): qty 3

## 2021-06-10 MED ORDER — LACTATED RINGERS IV SOLN: INTRAVENOUS | Status: DC

## 2021-06-10 MED ORDER — FUROSEMIDE 10 MG/ML IJ SOLN
40.0000 mg | Freq: Once | INTRAMUSCULAR | Status: AC
  Administered 2021-06-10: 40 mg via INTRAVENOUS
  Filled 2021-06-10: qty 4

## 2021-06-10 MED ORDER — METOPROLOL TARTRATE 50 MG PO TABS
100.0000 mg | ORAL_TABLET | Freq: Two times a day (BID) | ORAL | Status: DC
  Administered 2021-06-10: 100 mg via ORAL
  Filled 2021-06-10: qty 2

## 2021-06-10 MED ORDER — PIPERACILLIN-TAZOBACTAM 3.375 G IVPB
3.3750 g | Freq: Three times a day (TID) | INTRAVENOUS | Status: DC
  Administered 2021-06-10 – 2021-06-13 (×10): 3.375 g via INTRAVENOUS
  Filled 2021-06-10 (×10): qty 50

## 2021-06-10 MED ORDER — DILTIAZEM HCL 30 MG PO TABS
30.0000 mg | ORAL_TABLET | Freq: Four times a day (QID) | ORAL | Status: DC
  Administered 2021-06-10 – 2021-06-12 (×7): 30 mg via ORAL
  Filled 2021-06-10 (×7): qty 1

## 2021-06-10 NOTE — Care Management Important Message (Signed)
Important Message  Patient Details  Name: [name] [name] MRN: [id number] Date of Birth: 1940-11-15   Medicare Important Message Given:  Yes     Tommy Medal 06/10/2021, 1:28 PM

## 2021-06-10 NOTE — Progress Notes (Addendum)
PROGRESS NOTE    [name] [name]  JYN:[id number] DOB: 07-06-1940 DOA: 06/08/2021 PCP: [name], MD   Brief Narrative:    DRAYK HUMBARGER is a 81 y.o. male with medical history significant of atrial fibrillation, CKD stage IIIb, prostate cancer with bone metastasis, hypothyroidism, hypertension who presents to the emergency department due to sudden onset of weakness and malaise this afternoon.  He was admitted with concerns for sepsis and appears to be having severe sepsis secondary to UTI and possible Community-acquired pneumonia and has been started on Rocephin and azithromycin.  He is also noted to have atrial fibrillation with RVR and is currently on Cardizem drip.  Assessment & Plan:   Principal Problem:   SIRS (systemic inflammatory response syndrome) (HCC) Active Problems:   Essential hypertension   Acute kidney injury superimposed on chronic kidney disease (HCC)   Hypothyroidism   Leukocytosis   Paroxysmal atrial fibrillation with RVR (HCC)   Lactic acidosis  Assessment and Plan:   Severe sepsis secondary to UTI with possible CAP, present on admission Blood cultures with no growth thus far and urine cultures ordered after initial antibiotics Patient was started on ceftriaxone and azithromycin, this will be broadened to Zosyn today based on elevating procalcitonin levels as well as worsening leukocytosis Continue Tylenol as needed Continue Mucinex, incentive spirometry, flutter valve    Leukocytosis possible secondary to above Continue to follow, noted with uptrend in procalcitonin elevation Antibiotics broadened to Zosyn 6/2  Lactic acidosis possibly secondary to multifactorial Resolved  Paroxysmal A-fib with RVR Continue IV Cardizem drip  Home Lopressor initiated Continue Xarelto  Acute kidney injury on CKD stage IV BUN/creatinine 36/2.54 on admission (baseline creatinine at 2.0), downward trending Continue gentle hydration Renally adjust medications,  avoid nephrotoxic agents/dehydration/hypotension   Hypothyroidism Continue Synthroid  Essential hypertension Continue IV Cardizem drip at this time and wean, home Lopressor resume 6/1  Prostate cx with metastasis Currently on treatment with urology     DVT prophylaxis: Xarelto Code Status: Full Family Communication: Discussed with son on phone 6/2 Disposition Plan:  Status is: Inpatient Remains inpatient appropriate because: IV medications   Consultants:  None   Procedures:  None   Antimicrobials:  Anti-infectives (From admission, onward)    Start     Dose/Rate Route Frequency Ordered Stop   06/10/21 0930  piperacillin-tazobactam (ZOSYN) IVPB 3.375 g        3.375 g 12.5 mL/hr over 240 Minutes Intravenous Every 8 hours 06/10/21 0838     06/08/21 2000  cefTRIAXone (ROCEPHIN) 2 g in sodium chloride 0.9 % 100 mL IVPB  Status:  Discontinued        2 g 200 mL/hr over 30 Minutes Intravenous Every 24 hours 06/08/21 1946 06/10/21 0821   06/08/21 2000  azithromycin (ZITHROMAX) 500 mg in sodium chloride 0.9 % 250 mL IVPB  Status:  Discontinued        500 mg 250 mL/hr over 60 Minutes Intravenous Every 24 hours 06/08/21 1946 06/10/21 8657      Subjective: Patient seen and evaluated today and is resting comfortably.  Noted to have worsening tachycardia overnight requiring increased dosing of Cardizem drip.  Objective: Vitals:   06/10/21 0600 06/10/21 0700 06/10/21 0725 06/10/21 0800  BP: 104/66 114/62  121/61  Pulse: 70 96 96 82  Resp: (!) 25 (!) 48 (!) 40 (!) 24  Temp:  (!) 96.2 F (35.7 C)    TempSrc:  Axillary    SpO2: 100% 100% 100% 99%  Weight: 130.4  kg     Height:        Intake/Output Summary (Last 24 hours) at 06/10/2021 0931 Last data filed at 06/10/2021 0626 Gross per 24 hour  Intake 3129.76 ml  Output 1325 ml  Net 1804.76 ml   Filed Weights   06/08/21 1917 06/09/21 1000 06/10/21 0600  Weight: 122.5 kg 128.8 kg 130.4 kg    Examination:  General exam:  Appears calm and comfortable  Respiratory system: Clear to auscultation. Respiratory effort normal.  2 L nasal cannula Cardiovascular system: S1 & S2 heard, irregular and tachycardic Gastrointestinal system: Abdomen is soft Central nervous system: Somnolent Extremities: No edema Skin: No significant lesions noted Psychiatry: Flat affect.    Data Reviewed: I have personally reviewed following labs and imaging studies  CBC: Recent Labs  Lab 06/08/21 1936 06/09/21 0313 06/10/21 0420  WBC 15.6* 14.0* 17.8*  NEUTROABS 11.9*  --   --   HGB 14.0 12.3* 11.5*  HCT 42.3 36.8* 35.6*  MCV 97.7 99.5 101.4*  PLT 268 222 673   Basic Metabolic Panel: Recent Labs  Lab 06/08/21 1936 06/09/21 0313 06/10/21 0420  NA 134* 136 136  K 4.8 4.1 5.1  CL 103 105 106  CO2 21* 24 23  GLUCOSE 204* 139* 180*  BUN 36* 34* 33*  CREATININE 2.54* 2.28* 2.19*  CALCIUM 8.6* 7.9* 7.9*  MG  --  1.6* 1.9  PHOS  --  4.0  --    GFR: Estimated Creatinine Clearance: 38 mL/min (A) (by C-G formula based on SCr of 2.19 mg/dL (H)). Liver Function Tests: Recent Labs  Lab 06/08/21 1936 06/09/21 0313  AST 20 30  ALT 19 18  ALKPHOS 44 36*  BILITOT 1.3* 0.9  PROT 8.0 6.8  ALBUMIN 3.7 3.1*   No results for input(s): LIPASE, AMYLASE in the last 168 hours. No results for input(s): AMMONIA in the last 168 hours. Coagulation Profile: Recent Labs  Lab 06/08/21 1936  INR 1.3*   Cardiac Enzymes: No results for input(s): CKTOTAL, CKMB, CKMBINDEX, TROPONINI in the last 168 hours. BNP (last 3 results) No results for input(s): PROBNP in the last 8760 hours. HbA1C: No results for input(s): HGBA1C in the last 72 hours. CBG: Recent Labs  Lab 06/09/21 1015  GLUCAP 204*   Lipid Profile: No results for input(s): CHOL, HDL, LDLCALC, TRIG, CHOLHDL, LDLDIRECT in the last 72 hours. Thyroid Function Tests: No results for input(s): TSH, T4TOTAL, FREET4, T3FREE, THYROIDAB in the last 72 hours. Anemia Panel: No  results for input(s): VITAMINB12, FOLATE, FERRITIN, TIBC, IRON, RETICCTPCT in the last 72 hours. Sepsis Labs: Recent Labs  Lab 06/08/21 1936 06/08/21 2127 06/09/21 0313 06/09/21 0555 06/10/21 0420  PROCALCITON  --   --  0.60  --  4.94  LATICACIDVEN 3.3* 2.1* 1.9 1.4  --     Recent Results (from the past 240 hour(s))  Blood Culture (routine x 2)     Status: None (Preliminary result)   Collection Time: 06/08/21  7:36 PM   Specimen: BLOOD  Result Value Ref Range Status   Specimen Description BLOOD RIGHT ANTECUBITAL  Final   Special Requests   Final    BOTTLES DRAWN AEROBIC AND ANAEROBIC Blood Culture adequate volume   Culture   Final    NO GROWTH < 12 HOURS Performed at [facility], [address] [place], [place] [address]    Report Status PENDING  Incomplete  Blood Culture (routine x 2)     Status: None (Preliminary result)  Collection Time: 06/08/21  7:48 PM   Specimen: BLOOD  Result Value Ref Range Status   Specimen Description BLOOD BLOOD LEFT HAND  Final   Special Requests   Final    BOTTLES DRAWN AEROBIC AND ANAEROBIC Blood Culture results may not be optimal due to an inadequate volume of blood received in culture bottles   Culture   Final    NO GROWTH < 12 HOURS Performed at [facility], [address], [place], [place] [address]    Report Status PENDING  Incomplete  Resp Panel by RT-PCR (Flu A&B, Covid) Anterior Nasal Swab     Status: None   Collection Time: 06/08/21  8:10 PM   Specimen: Anterior Nasal Swab  Result Value Ref Range Status   SARS Coronavirus 2 by RT PCR NEGATIVE NEGATIVE Final    Comment: (NOTE) SARS-CoV-2 target nucleic acids are NOT DETECTED.  The SARS-CoV-2 RNA is generally detectable in upper respiratory specimens during the acute phase of infection. The lowest concentration of SARS-CoV-2 viral copies this assay can detect is 138 copies/mL. A negative result does not preclude SARS-Cov-2 infection and should not be used as the sole basis for  treatment or other patient management decisions. A negative result may occur with  improper specimen collection/handling, submission of specimen other than nasopharyngeal swab, presence of viral mutation(s) within the areas targeted by this assay, and inadequate number of viral copies(<138 copies/mL). A negative result must be combined with clinical observations, patient history, and epidemiological information. The expected result is Negative.  Fact Sheet for Patients:  EntrepreneurPulse.com.au  Fact Sheet for Healthcare Providers:  IncredibleEmployment.be  This test is no t yet approved or cleared by the [place] FDA and  has been authorized for detection and/or diagnosis of SARS-CoV-2 by FDA under an Emergency Use Authorization (EUA). This EUA will remain  in effect (meaning this test can be used) for the duration of the COVID-19 declaration under Section 564(b)(1) of the Act, 21 U.S.C.section 360bbb-3(b)(1), unless the authorization is terminated  or revoked sooner.       Influenza A by PCR NEGATIVE NEGATIVE Final   Influenza B by PCR NEGATIVE NEGATIVE Final    Comment: (NOTE) The Xpert Xpress SARS-CoV-2/FLU/RSV plus assay is intended as an aid in the diagnosis of influenza from Nasopharyngeal swab specimens and should not be used as a sole basis for treatment. Nasal washings and aspirates are unacceptable for Xpert Xpress SARS-CoV-2/FLU/RSV testing.  Fact Sheet for Patients: EntrepreneurPulse.com.au  Fact Sheet for Healthcare Providers: IncredibleEmployment.be  This test is not yet approved or cleared by the [place] FDA and has been authorized for detection and/or diagnosis of SARS-CoV-2 by FDA under an Emergency Use Authorization (EUA). This EUA will remain in effect (meaning this test can be used) for the duration of the COVID-19 declaration under Section 564(b)(1) of the Act, 21  U.S.C. section 360bbb-3(b)(1), unless the authorization is terminated or revoked.  Performed at [facility], [address], [place], [place] [address]   MRSA Next Gen by PCR, Nasal     Status: None   Collection Time: 06/09/21  9:14 AM   Specimen: Nasal Mucosa; Nasal Swab  Result Value Ref Range Status   MRSA by PCR Next Gen NOT DETECTED NOT DETECTED Final    Comment: (NOTE) The GeneXpert MRSA Assay (FDA approved for NASAL specimens only), is one component of a comprehensive MRSA colonization surveillance program. It is not intended to diagnose MRSA infection nor to guide or monitor treatment for MRSA  infections. Test performance is not FDA approved in patients less than 78 years old. Performed at [facility], [address] [place], [place] [address]          Radiology Studies: [facility] 1 View  Result Date: 06/08/2021 CLINICAL DATA:  Questionable sepsis - evaluate for abnormality EXAM: PORTABLE CHEST 1 VIEW.  Patient is slightly rotated. COMPARISON:  Chest x-ray 09/24/2015 FINDINGS: The heart and mediastinal contours are unchanged. Aortic calcification. Elevated right hemidiaphragm. Right base atelectasis. No focal consolidation. No pulmonary edema. No pleural effusion. No pneumothorax. No acute osseous abnormality. IMPRESSION: 1. No active disease. 2.  Aortic Atherosclerosis (ICD10-I70.0). Electronically Signed   By: Iven Finn M.D.   On: 06/08/2021 20:08        Scheduled Meds:  Chlorhexidine Gluconate Cloth  6 each Topical Daily   dextromethorphan-guaiFENesin  1 tablet Oral BID   levothyroxine  100 mcg Oral Q0600   metoprolol tartrate  100 mg Oral BID   Rivaroxaban  15 mg Oral Q supper   traZODone  50 mg Oral QHS   Continuous Infusions:  diltiazem (CARDIZEM) infusion 5 mg/hr (06/10/21 0626)   lactated ringers     piperacillin-tazobactam (ZOSYN)  IV       LOS: 2 days    Time spent: 35 minutes    Jag Lenz Darleen Crocker, DO [facility]  If 7PM-7AM,  please contact night-coverage [email or web address] 06/10/2021, 9:31 AM

## 2021-06-10 NOTE — Plan of Care (Signed)

## 2021-06-10 NOTE — Progress Notes (Signed)
Pharmacy Antibiotic Note  [name] [name] a 81 y.o. male admitted on 06/10/2021 with  SIRS .  Pharmacy has been consulted for zosyn dosing.  Plan: Zosyn 3.375g IV q8h (4 hour infusion).  Medical History: Past Medical History:  Diagnosis Date   Cardiomyopathy [facility])    h/o felt 2nd atrial fib, EF normal by echo 2013   CHF (congestive heart failure) (HCC)    Chronic anticoagulation    Chronic atrial fibrillation (HCC)    Chronic kidney disease (CKD), stage IV (severe) (HCC)    Dysrhythmia    Hypothyroidism    Pneumonia    Pre-diabetes    Primary localized osteoarthritis of left hip 05/02/2016   Prostate cancer ([place])    Shortness of breath    Systemic hypertension     Allergies:  Allergies  Allergen Reactions   No Known Allergies     Filed Weights   06/08/21 1917 06/09/21 1000 06/10/21 0600  Weight: 122.5 kg (270 lb) 128.8 kg (283 lb 15.2 oz) 130.4 kg (287 lb 7.7 oz)       Latest Ref Rng & Units 06/10/2021    4:20 AM 06/09/2021    3:13 AM 06/08/2021    7:36 PM  CBC  WBC 4.0 - 10.5 K/uL 17.8   14.0   15.6    Hemoglobin 13.0 - 17.0 g/dL 11.5   12.3   14.0    Hematocrit 39.0 - 52.0 % 35.6   36.8   42.3    Platelets 150 - 400 K/uL 218   222   268       Estimated Creatinine Clearance: 38 mL/min (A) (by C-G formula based on SCr of 2.19 mg/dL (H)).  Antibiotics Given (last 72 hours)     Date/Time Action Medication Dose Rate   06/08/21 2003 New Bag/Given   azithromycin (ZITHROMAX) 500 mg in sodium chloride 0.9 % 250 mL IVPB 500 mg 250 mL/hr   06/08/21 2005 New Bag/Given   cefTRIAXone (ROCEPHIN) 2 g in sodium chloride 0.9 % 100 mL IVPB 2 g 200 mL/hr   06/09/21 1908 New Bag/Given   cefTRIAXone (ROCEPHIN) 2 g in sodium chloride 0.9 % 100 mL IVPB 2 g 200 mL/hr   06/09/21 1951 New Bag/Given   azithromycin (ZITHROMAX) 500 mg in sodium chloride 0.9 % 250 mL IVPB 500 mg 250 mL/hr       Antimicrobials this admission:  zosyn 06/10/2021  >>   Microbiology results: 06/08/2021   BCx: sent 06/09/2021 UCx: NGTD 06/08/2021 Resp Panel: negative   06/08/2021 MRSA PCR: negative   Thank you for allowing pharmacy to be a part of this patient's care.  Thomasenia Sales, PharmD Clinical Pharmacist

## 2021-06-10 NOTE — Progress Notes (Signed)
Ok to give scheduled 10AM Metoprolol at this time per MD, notified pharmacy & made aware

## 2021-06-11 DIAGNOSIS — R6510 Systemic inflammatory response syndrome (SIRS) of non-infectious origin without acute organ dysfunction: Secondary | ICD-10-CM | POA: Diagnosis not present

## 2021-06-11 LAB — CBC
HCT: 36.4 % — ABNORMAL LOW (ref 39.0–52.0)
Hemoglobin: 11.9 g/dL — ABNORMAL LOW (ref 13.0–17.0)
MCH: 32.9 pg (ref 26.0–34.0)
MCHC: 32.7 g/dL (ref 30.0–36.0)
MCV: 100.6 fL — ABNORMAL HIGH (ref 80.0–100.0)
Platelets: 225 10*3/uL (ref 150–400)
RBC: 3.62 MIL/uL — ABNORMAL LOW (ref 4.22–5.81)
RDW: 13.7 % (ref 11.5–15.5)
WBC: 13.4 10*3/uL — ABNORMAL HIGH (ref 4.0–10.5)
nRBC: 0 % (ref 0.0–0.2)

## 2021-06-11 LAB — COMPREHENSIVE METABOLIC PANEL
ALT: 30 U/L (ref 0–44)
AST: 37 U/L (ref 15–41)
Albumin: 2.8 g/dL — ABNORMAL LOW (ref 3.5–5.0)
Alkaline Phosphatase: 50 U/L (ref 38–126)
Anion gap: 8 (ref 5–15)
BUN: 38 mg/dL — ABNORMAL HIGH (ref 8–23)
CO2: 25 mmol/L (ref 22–32)
Calcium: 7.9 mg/dL — ABNORMAL LOW (ref 8.9–10.3)
Chloride: 104 mmol/L (ref 98–111)
Creatinine, Ser: 2.36 mg/dL — ABNORMAL HIGH (ref 0.61–1.24)
GFR, Estimated: 27 mL/min — ABNORMAL LOW (ref >60)
Glucose, Bld: 165 mg/dL — ABNORMAL HIGH (ref 70–99)
Potassium: 4.2 mmol/L (ref 3.5–5.1)
Sodium: 137 mmol/L (ref 135–145)
Total Bilirubin: 1 mg/dL (ref 0.3–1.2)
Total Protein: 6.9 g/dL (ref 6.5–8.1)

## 2021-06-11 LAB — PROCALCITONIN: Procalcitonin: 3.86 ng/mL

## 2021-06-11 LAB — MAGNESIUM: Magnesium: 1.9 mg/dL (ref 1.7–2.4)

## 2021-06-11 MED ORDER — METOPROLOL TARTRATE 50 MG PO TABS
100.0000 mg | ORAL_TABLET | Freq: Two times a day (BID) | ORAL | Status: DC
  Administered 2021-06-11 – 2021-06-18 (×14): 100 mg via ORAL
  Filled 2021-06-11 (×15): qty 2

## 2021-06-11 MED ORDER — FUROSEMIDE 40 MG PO TABS
40.0000 mg | ORAL_TABLET | Freq: Once | ORAL | Status: AC
  Administered 2021-06-11: 40 mg via ORAL
  Filled 2021-06-11: qty 1

## 2021-06-11 NOTE — Plan of Care (Signed)

## 2021-06-11 NOTE — Progress Notes (Signed)
PROGRESS NOTE    [name] [name]  OXB:[id number] DOB: 1940-01-19 DOA: 06/08/2021 PCP: [name], MD   Brief Narrative:    [name] [name] is a 81 y.o. male with medical history significant of atrial fibrillation, CKD stage IIIb, prostate cancer with bone metastasis, hypothyroidism, hypertension who presents to the emergency department due to sudden onset of weakness and malaise this afternoon.  He was admitted with concerns for sepsis and appears to be having severe sepsis secondary to UTI and possible Community-acquired pneumonia and has been started on Rocephin and azithromycin.  He is also noted to have atrial fibrillation with RVR and is currently on Cardizem drip.  Assessment & Plan:   Principal Problem:   SIRS (systemic inflammatory response syndrome) (HCC) Active Problems:   Essential hypertension   Acute kidney injury superimposed on chronic kidney disease (HCC)   Hypothyroidism   Leukocytosis   Paroxysmal atrial fibrillation with RVR (HCC)   Lactic acidosis  Assessment and Plan:   Severe sepsis secondary to UTI with possible CAP, present on admission Blood cultures with no growth thus far and urine cultures ordered after initial antibiotics Patient was initially on Rocephin and azithromycin which was changed to Zosyn 6/2 with improving leukocytosis and procalcitonin levels Continue Tylenol as needed Continue Mucinex, incentive spirometry, flutter valve   Acute hypoxemic respiratory failure secondary to cardiogenic pulmonary edema Status post dose of Lasix 6/2 with improvement in symptoms Hold further IV fluid and monitor Wean oxygen as tolerated   Leukocytosis possible secondary to above Continue to follow, noted with uptrend in procalcitonin elevation Antibiotics broadened to Zosyn 6/2  Lactic acidosis possibly secondary to multifactorial Resolved  Paroxysmal A-fib with RVR Continue IV Cardizem drip  Home Lopressor restarted and oral Cardizem  added Continue Xarelto  Acute kidney injury on CKD stage IV BUN/creatinine 36/2.54 on admission (baseline creatinine at 2.0), trending up after Lasix administration Hold further IV hydration Renally adjust medications, avoid nephrotoxic agents/dehydration/hypotension   Hypothyroidism Continue Synthroid  Essential hypertension Continue IV Cardizem drip at this time and wean, home Lopressor resume 6/1   Prostate cx with metastasis Currently on treatment with urology     DVT prophylaxis: Xarelto Code Status: DNR Family Communication: Discussed with son on phone 6/2 Disposition Plan:  Status is: Inpatient Remains inpatient appropriate because: IV medications   Consultants:  None   Procedures:  None   Antimicrobials:  Anti-infectives (From admission, onward)    Start     Dose/Rate Route Frequency Ordered Stop   06/10/21 0930  piperacillin-tazobactam (ZOSYN) IVPB 3.375 g        3.375 g 12.5 mL/hr over 240 Minutes Intravenous Every 8 hours 06/10/21 0838     06/08/21 2000  cefTRIAXone (ROCEPHIN) 2 g in sodium chloride 0.9 % 100 mL IVPB  Status:  Discontinued        2 g 200 mL/hr over 30 Minutes Intravenous Every 24 hours 06/08/21 1946 06/10/21 0821   06/08/21 2000  azithromycin (ZITHROMAX) 500 mg in sodium chloride 0.9 % 250 mL IVPB  Status:  Discontinued        500 mg 250 mL/hr over 60 Minutes Intravenous Every 24 hours 06/08/21 1946 06/10/21 6834      Subjective: Patient seen and evaluated today with improved shortness of breath and less oxygen requirements this morning.  No acute overnight events noted.  He continues to have some elevated heart rates.  Objective: Vitals:   06/11/21 1000 06/11/21 1030 06/11/21 1100 06/11/21 1117  BP: 137/68  99/61   Pulse: (!) 119 97 74 75  Resp: (!) 30 (!) 22 (!) 26 (!) 28  Temp:    99.1 F (37.3 C)  TempSrc:    Oral  SpO2: 97% 97% 95% 91%  Weight:      Height:        Intake/Output Summary (Last 24 hours) at 06/11/2021  1144 Last data filed at 06/11/2021 1114 Gross per 24 hour  Intake 365.98 ml  Output 2150 ml  Net -1784.02 ml   Filed Weights   06/09/21 1000 06/10/21 0600 06/11/21 0414  Weight: 128.8 kg 130.4 kg 128.4 kg    Examination:  General exam: Appears calm and comfortable  Respiratory system: Clear to auscultation. Respiratory effort normal.  Currently on 1 L nasal cannula Cardiovascular system: S1 & S2 heard, irregular and tachycardic. Gastrointestinal system: Abdomen is soft Central nervous system: Alert and awake Extremities: No edema Skin: No significant lesions noted Psychiatry: Flat affect.    Data Reviewed: I have personally reviewed following labs and imaging studies  CBC: Recent Labs  Lab 06/08/21 1936 06/09/21 0313 06/10/21 0420 06/11/21 0424  WBC 15.6* 14.0* 17.8* 13.4*  NEUTROABS 11.9*  --   --   --   HGB 14.0 12.3* 11.5* 11.9*  HCT 42.3 36.8* 35.6* 36.4*  MCV 97.7 99.5 101.4* 100.6*  PLT 268 222 218 497   Basic Metabolic Panel: Recent Labs  Lab 06/08/21 1936 06/09/21 0313 06/10/21 0420 06/11/21 0424  NA 134* 136 136 137  K 4.8 4.1 5.1 4.2  CL 103 105 106 104  CO2 21* '24 23 25  '$ GLUCOSE 204* 139* 180* 165*  BUN 36* 34* 33* 38*  CREATININE 2.54* 2.28* 2.19* 2.36*  CALCIUM 8.6* 7.9* 7.9* 7.9*  MG  --  1.6* 1.9 1.9  PHOS  --  4.0  --   --    GFR: Estimated Creatinine Clearance: 35 mL/min (A) (by C-G formula based on SCr of 2.36 mg/dL (H)). Liver Function Tests: Recent Labs  Lab 06/08/21 1936 06/09/21 0313 06/11/21 0424  AST 20 30 37  ALT '19 18 30  '$ ALKPHOS 44 36* 50  BILITOT 1.3* 0.9 1.0  PROT 8.0 6.8 6.9  ALBUMIN 3.7 3.1* 2.8*   No results for input(s): LIPASE, AMYLASE in the last 168 hours. No results for input(s): AMMONIA in the last 168 hours. Coagulation Profile: Recent Labs  Lab 06/08/21 1936  INR 1.3*   Cardiac Enzymes: No results for input(s): CKTOTAL, CKMB, CKMBINDEX, TROPONINI in the last 168 hours. BNP (last 3 results) No  results for input(s): PROBNP in the last 8760 hours. HbA1C: No results for input(s): HGBA1C in the last 72 hours. CBG: Recent Labs  Lab 06/09/21 1015  GLUCAP 204*   Lipid Profile: No results for input(s): CHOL, HDL, LDLCALC, TRIG, CHOLHDL, LDLDIRECT in the last 72 hours. Thyroid Function Tests: No results for input(s): TSH, T4TOTAL, FREET4, T3FREE, THYROIDAB in the last 72 hours. Anemia Panel: No results for input(s): VITAMINB12, FOLATE, FERRITIN, TIBC, IRON, RETICCTPCT in the last 72 hours. Sepsis Labs: Recent Labs  Lab 06/08/21 1936 06/08/21 2127 06/09/21 0313 06/09/21 0555 06/10/21 0420 06/11/21 0424  PROCALCITON  --   --  0.60  --  4.94 3.86  LATICACIDVEN 3.3* 2.1* 1.9 1.4  --   --     Recent Results (from the past 240 hour(s))  Blood Culture (routine x 2)     Status: None (Preliminary result)   Collection Time: 06/08/21  7:36 PM   Specimen:  BLOOD  Result Value Ref Range Status   Specimen Description BLOOD RIGHT ANTECUBITAL  Final   Special Requests   Final    BOTTLES DRAWN AEROBIC AND ANAEROBIC Blood Culture adequate volume   Culture   Final    NO GROWTH 3 DAYS Performed at [facility], [address] [place], [place] [address]    Report Status PENDING  Incomplete  Blood Culture (routine x 2)     Status: None (Preliminary result)   Collection Time: 06/08/21  7:48 PM   Specimen: BLOOD  Result Value Ref Range Status   Specimen Description BLOOD BLOOD LEFT HAND  Final   Special Requests   Final    BOTTLES DRAWN AEROBIC AND ANAEROBIC Blood Culture results may not be optimal due to an inadequate volume of blood received in culture bottles   Culture   Final    NO GROWTH 3 DAYS Performed at [facility], [address] [place], [place] [address]    Report Status PENDING  Incomplete  Resp Panel by RT-PCR (Flu A&B, Covid) Anterior Nasal Swab     Status: None   Collection Time: 06/08/21  8:10 PM   Specimen: Anterior Nasal Swab  Result Value Ref Range Status   SARS  Coronavirus 2 by RT PCR NEGATIVE NEGATIVE Final    Comment: (NOTE) SARS-CoV-2 target nucleic acids are NOT DETECTED.  The SARS-CoV-2 RNA is generally detectable in upper respiratory specimens during the acute phase of infection. The lowest concentration of SARS-CoV-2 viral copies this assay can detect is 138 copies/mL. A negative result does not preclude SARS-Cov-2 infection and should not be used as the sole basis for treatment or other patient management decisions. A negative result may occur with  improper specimen collection/handling, submission of specimen other than nasopharyngeal swab, presence of viral mutation(s) within the areas targeted by this assay, and inadequate number of viral copies(<138 copies/mL). A negative result must be combined with clinical observations, patient history, and epidemiological information. The expected result is Negative.  Fact Sheet for Patients:  EntrepreneurPulse.com.au  Fact Sheet for Healthcare Providers:  IncredibleEmployment.be  This test is no t yet approved or cleared by the [place] FDA and  has been authorized for detection and/or diagnosis of SARS-CoV-2 by FDA under an Emergency Use Authorization (EUA). This EUA will remain  in effect (meaning this test can be used) for the duration of the COVID-19 declaration under Section 564(b)(1) of the Act, 21 U.S.C.section 360bbb-3(b)(1), unless the authorization is terminated  or revoked sooner.       Influenza A by PCR NEGATIVE NEGATIVE Final   Influenza B by PCR NEGATIVE NEGATIVE Final    Comment: (NOTE) The Xpert Xpress SARS-CoV-2/FLU/RSV plus assay is intended as an aid in the diagnosis of influenza from Nasopharyngeal swab specimens and should not be used as a sole basis for treatment. Nasal washings and aspirates are unacceptable for Xpert Xpress SARS-CoV-2/FLU/RSV testing.  Fact Sheet for  Patients: EntrepreneurPulse.com.au  Fact Sheet for Healthcare Providers: IncredibleEmployment.be  This test is not yet approved or cleared by the [place] FDA and has been authorized for detection and/or diagnosis of SARS-CoV-2 by FDA under an Emergency Use Authorization (EUA). This EUA will remain in effect (meaning this test can be used) for the duration of the COVID-19 declaration under Section 564(b)(1) of the Act, 21 U.S.C. section 360bbb-3(b)(1), unless the authorization is terminated or revoked.  Performed at [facility], [address], [place], [place] [address]   MRSA Next Gen by  PCR, Nasal     Status: None   Collection Time: 06/09/21  9:14 AM   Specimen: Nasal Mucosa; Nasal Swab  Result Value Ref Range Status   MRSA by PCR Next Gen NOT DETECTED NOT DETECTED Final    Comment: (NOTE) The GeneXpert MRSA Assay (FDA approved for NASAL specimens only), is one component of a comprehensive MRSA colonization surveillance program. It is not intended to diagnose MRSA infection nor to guide or monitor treatment for MRSA infections. Test performance is not FDA approved in patients less than 60 years old. Performed at [facility], [address], [place], [place] [address]   Urine Culture     Status: Abnormal (Preliminary result)   Collection Time: 06/09/21 12:33 PM   Specimen: Urine, Clean Catch  Result Value Ref Range Status   Specimen Description   Final    URINE, CLEAN CATCH Performed at [facility], [address], [place], [place] [address]    Special Requests   Final    NONE Performed at [facility], [address], [place], [place] [address]    Culture (A)  Final    40,000 COLONIES/mL GRAM NEGATIVE RODS IDENTIFICATION AND SUSCEPTIBILITIES TO FOLLOW Performed at [facility], [place] [address] [place], [place] [address]    Report Status PENDING  Incomplete  Culture, blood (Routine X 2) w Reflex to ID Panel     Status: None  (Preliminary result)   Collection Time: 06/10/21  9:38 AM   Specimen: BLOOD LEFT HAND  Result Value Ref Range Status   Specimen Description   Final    BLOOD LEFT HAND BOTTLES DRAWN AEROBIC AND ANAEROBIC   Special Requests Blood Culture adequate volume  Final   Culture   Final    NO GROWTH < 24 HOURS Performed at [facility], [address] [place], [place] [address]    Report Status PENDING  Incomplete  Culture, blood (Routine X 2) w Reflex to ID Panel     Status: None (Preliminary result)   Collection Time: 06/10/21  2:48 PM   Specimen: BLOOD RIGHT HAND  Result Value Ref Range Status   Specimen Description BLOOD RIGHT HAND  Final   Special Requests   Final    BOTTLES DRAWN AEROBIC AND ANAEROBIC Blood Culture adequate volume   Culture   Final    NO GROWTH < 12 HOURS Performed at [facility], [address], [place], [place] [address]    Report Status PENDING  Incomplete         Radiology Studies: DG CHEST PORT 1 VIEW  Result Date: 06/10/2021 CLINICAL DATA:  Wheezing. EXAM: PORTABLE CHEST 1 VIEW COMPARISON:  Chest x-ray 02/03/2009. FINDINGS: There is stable mild elevation of the right hemidiaphragm. The heart is enlarged. There is central pulmonary vascular congestion. There is linear atelectasis in the right lower lung. There is no pleural effusion or pneumothorax. No acute fractures are seen. IMPRESSION: 1. Cardiomegaly with central pulmonary vascular congestion. Electronically Signed   By: Ronney Asters M.D.   On: 06/10/2021 17:47        Scheduled Meds:  Chlorhexidine Gluconate Cloth  6 each Topical Daily   dextromethorphan-guaiFENesin  1 tablet Oral BID   diltiazem  30 mg Oral Q6H   levothyroxine  100 mcg Oral Q0600   metoprolol tartrate  100 mg Oral BID   Rivaroxaban  15 mg Oral Q supper   traZODone  50 mg Oral QHS   Continuous Infusions:  diltiazem (CARDIZEM) infusion 5  mg/hr (06/11/21 1114)   piperacillin-tazobactam (ZOSYN)  IV Stopped (06/11/21 0954)      LOS: 3 days    Time spent: 35 minutes    Zianna Dercole D Manuella Ghazi, DO [facility]  If 7PM-7AM, please contact night-coverage [email or web address] 06/11/2021, 11:44 AM

## 2021-06-11 NOTE — Progress Notes (Signed)
Pharmacy Antibiotic Note  [name] [name] a 81 y.o. male admitted on 06/11/2021 with  SIRS .  Pharmacy has been consulted for zosyn dosing.  Plan: Zosyn 3.375g IV q8h (4 hour infusion).  Medical History: Past Medical History:  Diagnosis Date   Cardiomyopathy [facility])    h/o felt 2nd atrial fib, EF normal by echo 2013   CHF (congestive heart failure) (HCC)    Chronic anticoagulation    Chronic atrial fibrillation (HCC)    Chronic kidney disease (CKD), stage IV (severe) (HCC)    Dysrhythmia    Hypothyroidism    Pneumonia    Pre-diabetes    Primary localized osteoarthritis of left hip 05/02/2016   Prostate cancer ([place])    Shortness of breath    Systemic hypertension     Allergies:  Allergies  Allergen Reactions   No Known Allergies     Filed Weights   06/09/21 1000 06/10/21 0600 06/11/21 0414  Weight: 128.8 kg (283 lb 15.2 oz) 130.4 kg (287 lb 7.7 oz) 128.4 kg (283 lb 1.1 oz)       Latest Ref Rng & Units 06/11/2021    4:24 AM 06/10/2021    4:20 AM 06/09/2021    3:13 AM  CBC  WBC 4.0 - 10.5 K/uL 13.4   17.8   14.0    Hemoglobin 13.0 - 17.0 g/dL 11.9   11.5   12.3    Hematocrit 39.0 - 52.0 % 36.4   35.6   36.8    Platelets 150 - 400 K/uL 225   218   222       Estimated Creatinine Clearance: 35 mL/min (A) (by C-G formula based on SCr of 2.36 mg/dL (H)).  Antibiotics Given (last 72 hours)     Date/Time Action Medication Dose Rate   06/08/21 2003 New Bag/Given   azithromycin (ZITHROMAX) 500 mg in sodium chloride 0.9 % 250 mL IVPB 500 mg 250 mL/hr   06/08/21 2005 New Bag/Given   cefTRIAXone (ROCEPHIN) 2 g in sodium chloride 0.9 % 100 mL IVPB 2 g 200 mL/hr   06/09/21 1908 New Bag/Given   cefTRIAXone (ROCEPHIN) 2 g in sodium chloride 0.9 % 100 mL IVPB 2 g 200 mL/hr   06/09/21 1951 New Bag/Given   azithromycin (ZITHROMAX) 500 mg in sodium chloride 0.9 % 250 mL IVPB 500 mg 250 mL/hr   06/10/21 1020 New Bag/Given   piperacillin-tazobactam (ZOSYN) IVPB 3.375 g 3.375 g 12.5  mL/hr   06/10/21 1500 New Bag/Given   piperacillin-tazobactam (ZOSYN) IVPB 3.375 g 3.375 g 12.5 mL/hr   06/10/21 2150 New Bag/Given   piperacillin-tazobactam (ZOSYN) IVPB 3.375 g 3.375 g 12.5 mL/hr   06/11/21 0548 New Bag/Given   piperacillin-tazobactam (ZOSYN) IVPB 3.375 g 3.375 g 12.5 mL/hr       Antimicrobials this admission:  zosyn 06/10/2021  >>   Microbiology results: 06/08/2021  BCx: sent 06/09/2021 UCx: 40k GNR  06/08/2021 Resp Panel: negative   06/08/2021 MRSA PCR: negative   Thank you for allowing pharmacy to be a part of this patient's care.  Thomasenia Sales, PharmD Clinical Pharmacist

## 2021-06-12 ENCOUNTER — Inpatient Hospital Stay (HOSPITAL_COMMUNITY): Payer: PPO

## 2021-06-12 DIAGNOSIS — I4891 Unspecified atrial fibrillation: Secondary | ICD-10-CM

## 2021-06-12 DIAGNOSIS — R6510 Systemic inflammatory response syndrome (SIRS) of non-infectious origin without acute organ dysfunction: Secondary | ICD-10-CM | POA: Diagnosis not present

## 2021-06-12 LAB — BASIC METABOLIC PANEL
Anion gap: 8 (ref 5–15)
BUN: 42 mg/dL — ABNORMAL HIGH (ref 8–23)
CO2: 27 mmol/L (ref 22–32)
Calcium: 7.9 mg/dL — ABNORMAL LOW (ref 8.9–10.3)
Chloride: 103 mmol/L (ref 98–111)
Creatinine, Ser: 2.53 mg/dL — ABNORMAL HIGH (ref 0.61–1.24)
GFR, Estimated: 25 mL/min — ABNORMAL LOW (ref >60)
Glucose, Bld: 153 mg/dL — ABNORMAL HIGH (ref 70–99)
Potassium: 4 mmol/L (ref 3.5–5.1)
Sodium: 138 mmol/L (ref 135–145)

## 2021-06-12 LAB — CBC
HCT: 37.3 % — ABNORMAL LOW (ref 39.0–52.0)
Hemoglobin: 12 g/dL — ABNORMAL LOW (ref 13.0–17.0)
MCH: 32.7 pg (ref 26.0–34.0)
MCHC: 32.2 g/dL (ref 30.0–36.0)
MCV: 101.6 fL — ABNORMAL HIGH (ref 80.0–100.0)
Platelets: 240 10*3/uL (ref 150–400)
RBC: 3.67 MIL/uL — ABNORMAL LOW (ref 4.22–5.81)
RDW: 13.7 % (ref 11.5–15.5)
WBC: 12.8 10*3/uL — ABNORMAL HIGH (ref 4.0–10.5)
nRBC: 0 % (ref 0.0–0.2)

## 2021-06-12 LAB — ECHOCARDIOGRAM COMPLETE
Height: 74 in
S' Lateral: 2.5 cm
Weight: 4476.22 oz

## 2021-06-12 LAB — TSH: TSH: 0.796 u[IU]/mL (ref 0.350–4.500)

## 2021-06-12 LAB — MAGNESIUM: Magnesium: 2 mg/dL (ref 1.7–2.4)

## 2021-06-12 MED ORDER — DILTIAZEM HCL 60 MG PO TABS
60.0000 mg | ORAL_TABLET | Freq: Four times a day (QID) | ORAL | Status: DC
  Administered 2021-06-12 – 2021-06-13 (×4): 60 mg via ORAL
  Filled 2021-06-12 (×6): qty 1

## 2021-06-12 MED ORDER — DILTIAZEM HCL 30 MG PO TABS
30.0000 mg | ORAL_TABLET | Freq: Once | ORAL | Status: AC
  Administered 2021-06-12: 30 mg via ORAL
  Filled 2021-06-12: qty 1

## 2021-06-12 NOTE — Plan of Care (Signed)

## 2021-06-12 NOTE — Progress Notes (Signed)
PROGRESS NOTE    [name] [name]  FGH:[id number] DOB: 12/04/1940 DOA: 06/08/2021 PCP: [name], MD   Brief Narrative:    [name] [name] is a 81 y.o. male with medical history significant of atrial fibrillation, CKD stage IIIb, prostate cancer with bone metastasis, hypothyroidism, hypertension who presents to the emergency department due to sudden onset of weakness and malaise this afternoon.  He was admitted with concerns for sepsis and appears to be having severe sepsis secondary to UTI and possible Community-acquired pneumonia and has been started on Rocephin and azithromycin.  He is also noted to have atrial fibrillation with RVR and is currently on Cardizem drip.  Assessment & Plan:   Principal Problem:   SIRS (systemic inflammatory response syndrome) (HCC) Active Problems:   Essential hypertension   Acute kidney injury superimposed on chronic kidney disease (HCC)   Hypothyroidism   Leukocytosis   Paroxysmal atrial fibrillation with RVR (HCC)   Lactic acidosis  Assessment and Plan:   Severe sepsis secondary to UTI with possible CAP, present on admission Blood cultures with no growth thus far and urine cultures ordered after initial antibiotics Patient was initially on Rocephin and azithromycin which was changed to Zosyn 6/2 with improving leukocytosis and procalcitonin levels Repeat blood culture 6/2 with no growth to date Continue Tylenol as needed Continue Mucinex, incentive spirometry, flutter valve    Acute hypoxemic respiratory failure secondary to cardiogenic pulmonary edema Status post dose of Lasix 6/2 with improvement in symptoms Hold further IV fluid and monitor Wean oxygen as tolerated Update 2D echocardiogram   Leukocytosis possible secondary to above Currently downtrending, continue to follow Antibiotics broadened to Zosyn 6/2  Lactic acidosis possibly secondary to multifactorial Resolved  Paroxysmal A-fib with RVR Continue IV Cardizem drip   Home Lopressor restarted and oral Cardizem added Continue Xarelto Echocardiogram and TSH ordered  Acute kidney injury on CKD stage IV BUN/creatinine 36/2.54 on admission (baseline creatinine at 2.0), trending up after Lasix administration Hold further IV hydration and monitor Renally adjust medications, avoid nephrotoxic agents/dehydration/hypotension   Hypothyroidism Continue Synthroid  Essential hypertension Continue IV Cardizem drip at this time and wean, home Lopressor resume 6/1 Oral Cardizem initiated   Prostate cx with metastasis Currently on treatment with urology and follows up outpatient for this     DVT prophylaxis: Xarelto Code Status: DNR Family Communication: Discussed with daughter-in-law at bedside 6/4 Disposition Plan:  Status is: Inpatient Remains inpatient appropriate because: IV medications   Consultants:  None   Procedures:  None   Antimicrobials:  Anti-infectives (From admission, onward)    Start     Dose/Rate Route Frequency Ordered Stop   06/10/21 0930  piperacillin-tazobactam (ZOSYN) IVPB 3.375 g        3.375 g 12.5 mL/hr over 240 Minutes Intravenous Every 8 hours 06/10/21 0838     06/08/21 2000  cefTRIAXone (ROCEPHIN) 2 g in sodium chloride 0.9 % 100 mL IVPB  Status:  Discontinued        2 g 200 mL/hr over 30 Minutes Intravenous Every 24 hours 06/08/21 1946 06/10/21 0821   06/08/21 2000  azithromycin (ZITHROMAX) 500 mg in sodium chloride 0.9 % 250 mL IVPB  Status:  Discontinued        500 mg 250 mL/hr over 60 Minutes Intravenous Every 24 hours 06/08/21 1946 06/10/21 6789      Subjective: Patient seen and evaluated today with no new acute complaints or concerns. No acute concerns or events noted overnight.  He states that he  is breathing better overall and seems to be feeling better.  Noted to have ongoing elevated heart rates requiring Cardizem infusion.  Objective: Vitals:   06/12/21 0936 06/12/21 1000 06/12/21 1100 06/12/21 1119   BP:  131/68 (!) 123/50   Pulse: (!) 107 (!) 118 (!) 108 97  Resp: (!) 25 (!) 25 15 (!) 25  Temp:    98.2 F (36.8 C)  TempSrc:    Oral  SpO2: 100% 97% 99% 94%  Weight:      Height:        Intake/Output Summary (Last 24 hours) at 06/12/2021 1144 Last data filed at 06/12/2021 1119 Gross per 24 hour  Intake 1450.15 ml  Output 1925 ml  Net -474.85 ml   Filed Weights   06/10/21 0600 06/11/21 0414 06/12/21 0425  Weight: 130.4 kg 128.4 kg 126.9 kg    Examination:  General exam: Appears calm and comfortable  Respiratory system: Clear to auscultation. Respiratory effort normal.  2 L nasal cannula. Cardiovascular system: S1 & S2 heard, irregular and tachycardic. Gastrointestinal system: Abdomen is soft Central nervous system: Alert and awake Extremities: Scant bilateral edema Skin: No significant lesions noted Psychiatry: Flat affect.    Data Reviewed: I have personally reviewed following labs and imaging studies  CBC: Recent Labs  Lab 06/08/21 1936 06/09/21 0313 06/10/21 0420 06/11/21 0424 06/12/21 0401  WBC 15.6* 14.0* 17.8* 13.4* 12.8*  NEUTROABS 11.9*  --   --   --   --   HGB 14.0 12.3* 11.5* 11.9* 12.0*  HCT 42.3 36.8* 35.6* 36.4* 37.3*  MCV 97.7 99.5 101.4* 100.6* 101.6*  PLT 268 222 218 225 025   Basic Metabolic Panel: Recent Labs  Lab 06/08/21 1936 06/09/21 0313 06/10/21 0420 06/11/21 0424 06/12/21 0401  NA 134* 136 136 137 138  K 4.8 4.1 5.1 4.2 4.0  CL 103 105 106 104 103  CO2 21* '24 23 25 27  '$ GLUCOSE 204* 139* 180* 165* 153*  BUN 36* 34* 33* 38* 42*  CREATININE 2.54* 2.28* 2.19* 2.36* 2.53*  CALCIUM 8.6* 7.9* 7.9* 7.9* 7.9*  MG  --  1.6* 1.9 1.9 2.0  PHOS  --  4.0  --   --   --    GFR: Estimated Creatinine Clearance: 32.4 mL/min (A) (by C-G formula based on SCr of 2.53 mg/dL (H)). Liver Function Tests: Recent Labs  Lab 06/08/21 1936 06/09/21 0313 06/11/21 0424  AST 20 30 37  ALT '19 18 30  '$ ALKPHOS 44 36* 50  BILITOT 1.3* 0.9 1.0  PROT  8.0 6.8 6.9  ALBUMIN 3.7 3.1* 2.8*   No results for input(s): LIPASE, AMYLASE in the last 168 hours. No results for input(s): AMMONIA in the last 168 hours. Coagulation Profile: Recent Labs  Lab 06/08/21 1936  INR 1.3*   Cardiac Enzymes: No results for input(s): CKTOTAL, CKMB, CKMBINDEX, TROPONINI in the last 168 hours. BNP (last 3 results) No results for input(s): PROBNP in the last 8760 hours. HbA1C: No results for input(s): HGBA1C in the last 72 hours. CBG: Recent Labs  Lab 06/09/21 1015  GLUCAP 204*   Lipid Profile: No results for input(s): CHOL, HDL, LDLCALC, TRIG, CHOLHDL, LDLDIRECT in the last 72 hours. Thyroid Function Tests: Recent Labs    06/12/21 0401  TSH 0.796   Anemia Panel: No results for input(s): VITAMINB12, FOLATE, FERRITIN, TIBC, IRON, RETICCTPCT in the last 72 hours. Sepsis Labs: Recent Labs  Lab 06/08/21 1936 06/08/21 2127 06/09/21 8527 06/09/21 7824 06/10/21 0420 06/11/21 0424  06/12/21 0401  PROCALCITON  --   --  0.60  --  4.94 3.86 2.67  LATICACIDVEN 3.3* 2.1* 1.9 1.4  --   --   --     Recent Results (from the past 240 hour(s))  Blood Culture (routine x 2)     Status: None (Preliminary result)   Collection Time: 06/08/21  7:36 PM   Specimen: BLOOD  Result Value Ref Range Status   Specimen Description BLOOD RIGHT ANTECUBITAL  Final   Special Requests   Final    BOTTLES DRAWN AEROBIC AND ANAEROBIC Blood Culture adequate volume   Culture   Final    NO GROWTH 4 DAYS Performed at [facility], [address] [place], [place] [address]    Report Status PENDING  Incomplete  Blood Culture (routine x 2)     Status: None (Preliminary result)   Collection Time: 06/08/21  7:48 PM   Specimen: BLOOD  Result Value Ref Range Status   Specimen Description BLOOD BLOOD LEFT HAND  Final   Special Requests   Final    BOTTLES DRAWN AEROBIC AND ANAEROBIC Blood Culture results may not be optimal due to an inadequate volume of blood received in culture  bottles   Culture   Final    NO GROWTH 4 DAYS Performed at [facility], [address], [place], [place] [address]    Report Status PENDING  Incomplete  Resp Panel by RT-PCR (Flu A&B, Covid) Anterior Nasal Swab     Status: None   Collection Time: 06/08/21  8:10 PM   Specimen: Anterior Nasal Swab  Result Value Ref Range Status   SARS Coronavirus 2 by RT PCR NEGATIVE NEGATIVE Final    Comment: (NOTE) SARS-CoV-2 target nucleic acids are NOT DETECTED.  The SARS-CoV-2 RNA is generally detectable in upper respiratory specimens during the acute phase of infection. The lowest concentration of SARS-CoV-2 viral copies this assay can detect is 138 copies/mL. A negative result does not preclude SARS-Cov-2 infection and should not be used as the sole basis for treatment or other patient management decisions. A negative result may occur with  improper specimen collection/handling, submission of specimen other than nasopharyngeal swab, presence of viral mutation(s) within the areas targeted by this assay, and inadequate number of viral copies(<138 copies/mL). A negative result must be combined with clinical observations, patient history, and epidemiological information. The expected result is Negative.  Fact Sheet for Patients:  EntrepreneurPulse.com.au  Fact Sheet for Healthcare Providers:  IncredibleEmployment.be  This test is no t yet approved or cleared by the [place] FDA and  has been authorized for detection and/or diagnosis of SARS-CoV-2 by FDA under an Emergency Use Authorization (EUA). This EUA will remain  in effect (meaning this test can be used) for the duration of the COVID-19 declaration under Section 564(b)(1) of the Act, 21 U.S.C.section 360bbb-3(b)(1), unless the authorization is terminated  or revoked sooner.       Influenza A by PCR NEGATIVE NEGATIVE Final   Influenza B by PCR NEGATIVE NEGATIVE Final    Comment: (NOTE) The  Xpert Xpress SARS-CoV-2/FLU/RSV plus assay is intended as an aid in the diagnosis of influenza from Nasopharyngeal swab specimens and should not be used as a sole basis for treatment. Nasal washings and aspirates are unacceptable for Xpert Xpress SARS-CoV-2/FLU/RSV testing.  Fact Sheet for Patients: EntrepreneurPulse.com.au  Fact Sheet for Healthcare Providers: IncredibleEmployment.be  This test is not yet approved or cleared by the [place] and has been authorized for  detection and/or diagnosis of SARS-CoV-2 by FDA under an Emergency Use Authorization (EUA). This EUA will remain in effect (meaning this test can be used) for the duration of the COVID-19 declaration under Section 564(b)(1) of the Act, 21 U.S.C. section 360bbb-3(b)(1), unless the authorization is terminated or revoked.  Performed at [facility], [address], [place], [place] [address]   MRSA Next Gen by PCR, Nasal     Status: None   Collection Time: 06/09/21  9:14 AM   Specimen: Nasal Mucosa; Nasal Swab  Result Value Ref Range Status   MRSA by PCR Next Gen NOT DETECTED NOT DETECTED Final    Comment: (NOTE) The GeneXpert MRSA Assay (FDA approved for NASAL specimens only), is one component of a comprehensive MRSA colonization surveillance program. It is not intended to diagnose MRSA infection nor to guide or monitor treatment for MRSA infections. Test performance is not FDA approved in patients less than 39 years old. Performed at [facility], [address], [place], [place] [address]   Urine Culture     Status: Abnormal (Preliminary result)   Collection Time: 06/09/21 12:33 PM   Specimen: Urine, Clean Catch  Result Value Ref Range Status   Specimen Description   Final    URINE, CLEAN CATCH Performed at [facility], [address] [place], [place] [address]    Special Requests   Final    NONE Performed at [facility], [address], [place], [place]  [address]    Culture (A)  Final    40,000 COLONIES/mL GRAM NEGATIVE RODS CULTURE REINCUBATED FOR BETTER GROWTH Performed at [facility], [place] [address] [place], [place] [address]    Report Status PENDING  Incomplete  Culture, blood (Routine X 2) w Reflex to ID Panel     Status: None (Preliminary result)   Collection Time: 06/10/21  9:38 AM   Specimen: BLOOD LEFT HAND  Result Value Ref Range Status   Specimen Description   Final    BLOOD LEFT HAND BOTTLES DRAWN AEROBIC AND ANAEROBIC   Special Requests Blood Culture adequate volume  Final   Culture   Final    NO GROWTH 2 DAYS Performed at [facility], [address] [place], [place] [address]    Report Status PENDING  Incomplete  Culture, blood (Routine X 2) w Reflex to ID Panel     Status: None (Preliminary result)   Collection Time: 06/10/21  2:48 PM   Specimen: BLOOD RIGHT HAND  Result Value Ref Range Status   Specimen Description BLOOD RIGHT HAND  Final   Special Requests   Final    BOTTLES DRAWN AEROBIC AND ANAEROBIC Blood Culture adequate volume   Culture   Final    NO GROWTH 2 DAYS Performed at [facility], [address], [place], [place] [address]    Report Status PENDING  Incomplete         Radiology Studies: DG CHEST PORT 1 VIEW  Result Date: 06/10/2021 CLINICAL DATA:  Wheezing. EXAM: PORTABLE CHEST 1 VIEW COMPARISON:  Chest x-ray 02/03/2009. FINDINGS: There is stable mild elevation of the right hemidiaphragm. The heart is enlarged. There is central pulmonary vascular congestion. There is linear atelectasis in the right lower lung. There is no pleural effusion or pneumothorax. No acute fractures are seen. IMPRESSION: 1. Cardiomegaly with central pulmonary vascular congestion. Electronically Signed   By: Ronney Asters M.D.   On: 06/10/2021 17:47        Scheduled Meds:  Chlorhexidine Gluconate  Cloth  6 each Topical Daily   dextromethorphan-guaiFENesin  1 tablet Oral BID   diltiazem  60 mg Oral Q6H    levothyroxine  100 mcg Oral Q0600   metoprolol tartrate  100 mg Oral BID   Rivaroxaban  15 mg Oral Q supper   traZODone  50 mg Oral QHS   Continuous Infusions:  diltiazem (CARDIZEM) infusion Stopped (06/11/21 2130)   piperacillin-tazobactam (ZOSYN)  IV 3.375 g (06/12/21 0532)     LOS: 4 days    Time spent: 35 minutes    Atasha Colebank Darleen Crocker, DO [facility]  If 7PM-7AM, please contact night-coverage [email or web address] 06/12/2021, 11:44 AM

## 2021-06-13 DIAGNOSIS — R6510 Systemic inflammatory response syndrome (SIRS) of non-infectious origin without acute organ dysfunction: Secondary | ICD-10-CM | POA: Diagnosis not present

## 2021-06-13 LAB — LEGIONELLA PNEUMOPHILA SEROGP 1 UR AG: L. pneumophila Serogp 1 Ur Ag: NEGATIVE

## 2021-06-13 LAB — BASIC METABOLIC PANEL
Anion gap: 7 (ref 5–15)
BUN: 43 mg/dL — ABNORMAL HIGH (ref 8–23)
CO2: 25 mmol/L (ref 22–32)
Calcium: 7.8 mg/dL — ABNORMAL LOW (ref 8.9–10.3)
Chloride: 106 mmol/L (ref 98–111)
Creatinine, Ser: 2.32 mg/dL — ABNORMAL HIGH (ref 0.61–1.24)
GFR, Estimated: 28 mL/min — ABNORMAL LOW (ref >60)
Glucose, Bld: 178 mg/dL — ABNORMAL HIGH (ref 70–99)
Potassium: 3.9 mmol/L (ref 3.5–5.1)
Sodium: 138 mmol/L (ref 135–145)

## 2021-06-13 LAB — CBC
HCT: 34.9 % — ABNORMAL LOW (ref 39.0–52.0)
Hemoglobin: 11.4 g/dL — ABNORMAL LOW (ref 13.0–17.0)
MCH: 32.6 pg (ref 26.0–34.0)
MCHC: 32.7 g/dL (ref 30.0–36.0)
MCV: 99.7 fL (ref 80.0–100.0)
Platelets: 228 10*3/uL (ref 150–400)
RBC: 3.5 MIL/uL — ABNORMAL LOW (ref 4.22–5.81)
RDW: 13.6 % (ref 11.5–15.5)
WBC: 14.4 10*3/uL — ABNORMAL HIGH (ref 4.0–10.5)
nRBC: 0 % (ref 0.0–0.2)

## 2021-06-13 LAB — URINE CULTURE: Culture: 40000 — AB

## 2021-06-13 LAB — CULTURE, BLOOD (ROUTINE X 2)
Culture: NO GROWTH
Culture: NO GROWTH
Special Requests: ADEQUATE

## 2021-06-13 LAB — MAGNESIUM: Magnesium: 2.1 mg/dL (ref 1.7–2.4)

## 2021-06-13 MED ORDER — SODIUM CHLORIDE 0.9 % IV SOLN
2.0000 g | Freq: Two times a day (BID) | INTRAVENOUS | Status: DC
  Administered 2021-06-13 – 2021-06-17 (×9): 2 g via INTRAVENOUS
  Filled 2021-06-13 (×10): qty 12.5

## 2021-06-13 MED ORDER — DILTIAZEM HCL 60 MG PO TABS
60.0000 mg | ORAL_TABLET | Freq: Four times a day (QID) | ORAL | Status: DC
  Administered 2021-06-13 – 2021-06-14 (×3): 60 mg via ORAL
  Filled 2021-06-13 (×3): qty 1

## 2021-06-13 MED ORDER — DILTIAZEM HCL 60 MG PO TABS
90.0000 mg | ORAL_TABLET | Freq: Four times a day (QID) | ORAL | Status: DC
  Administered 2021-06-13: 60 mg via ORAL

## 2021-06-13 NOTE — Progress Notes (Signed)
PROGRESS NOTE    [name] [name]  SWH:[id number] DOB: 1940-05-29 DOA: 06/08/2021 PCP: [name], MD   Brief Narrative:    [name] [name] is a 81 y.o. male with medical history significant of atrial fibrillation, CKD stage IIIb, prostate cancer with bone metastasis, hypothyroidism, hypertension who presents to the emergency department due to sudden onset of weakness and malaise this afternoon.  He was admitted with concerns for sepsis and appears to be having severe sepsis secondary to UTI and possible Community-acquired pneumonia and has been started on Rocephin and azithromycin.  He is also noted to have atrial fibrillation with RVR and has been weaned off of his Cardizem drip.  Assessment & Plan:   Principal Problem:   SIRS (systemic inflammatory response syndrome) (HCC) Active Problems:   Essential hypertension   Acute kidney injury superimposed on chronic kidney disease (HCC)   Hypothyroidism   Leukocytosis   Paroxysmal atrial fibrillation with RVR (HCC)   Lactic acidosis  Assessment and Plan:   Severe sepsis secondary to Enterobacter UTI with possible CAP, present on admission Blood cultures with no growth thus far and urine cultures ordered after initial antibiotics Patient was initially on Rocephin and azithromycin which was changed to Zosyn 6/2, now on cefepime per sensitivities on urine culture  Repeat blood culture 6/2 with no growth to date Continue Tylenol as needed Continue Mucinex, incentive spirometry, flutter valve    Acute hypoxemic respiratory failure secondary to cardiogenic pulmonary edema Status post dose of Lasix 6/2 with improvement in symptoms Hold further IV fluid and monitor Wean oxygen as tolerated 2D echocardiogram 6/4 with LVEF 60-65% and no other acute findings   Leukocytosis possible secondary to above Currently downtrending, continue to follow Zosyn discontinued and changed to cefepime 6/5  Lactic acidosis possibly secondary to  multifactorial Resolved  Paroxysmal A-fib with RVR-improved IV Cardizem discontinued Home Lopressor restarted and oral Cardizem added Continue Xarelto Echocardiogram as noted above and TSH within normal limits Okay for transfer to telemetry today  Acute kidney injury on CKD stage IV BUN/creatinine 36/2.54 on admission (baseline creatinine at 2.0), trending up after Lasix administration Hold further IV hydration and monitor Renally adjust medications, avoid nephrotoxic agents/dehydration/hypotension   Hypothyroidism Continue Synthroid  Essential hypertension Continue metoprolol Oral Cardizem initiated   Prostate cx with metastasis Currently on treatment with urology and follows up outpatient for this     DVT prophylaxis: Xarelto Code Status: DNR Family Communication: Discussed with daughter-in-law at bedside 6/5 Disposition Plan:  Status is: Inpatient Remains inpatient appropriate because: IV medications   Consultants:  None   Procedures:  None   Antimicrobials:  Anti-infectives (From admission, onward)    Start     Dose/Rate Route Frequency Ordered Stop   06/13/21 1000  ceFEPIme (MAXIPIME) 2 g in sodium chloride 0.9 % 100 mL IVPB        2 g 200 mL/hr over 30 Minutes Intravenous Every 12 hours 06/13/21 0902     06/10/21 0930  piperacillin-tazobactam (ZOSYN) IVPB 3.375 g  Status:  Discontinued        3.375 g 12.5 mL/hr over 240 Minutes Intravenous Every 8 hours 06/10/21 0838 06/13/21 0902   06/08/21 2000  cefTRIAXone (ROCEPHIN) 2 g in sodium chloride 0.9 % 100 mL IVPB  Status:  Discontinued        2 g 200 mL/hr over 30 Minutes Intravenous Every 24 hours 06/08/21 1946 06/10/21 0821   06/08/21 2000  azithromycin (ZITHROMAX) 500 mg in sodium chloride 0.9 % 250  mL IVPB  Status:  Discontinued        500 mg 250 mL/hr over 60 Minutes Intravenous Every 24 hours 06/08/21 1946 06/10/21 9509      Subjective: Patient seen and evaluated today with no new acute complaints  or concerns. No acute concerns or events noted overnight.  Objective: Vitals:   06/13/21 0920 06/13/21 1000 06/13/21 1100 06/13/21 1116  BP:  116/64 110/71   Pulse: (!) 108 (!) 101 (!) 41 85  Resp:  (!) 26 (!) 26 (!) 30  Temp:    98.2 F (36.8 C)  TempSrc:    Oral  SpO2:  94% 93% 97%  Weight:      Height:        Intake/Output Summary (Last 24 hours) at 06/13/2021 1134 Last data filed at 06/13/2021 0959 Gross per 24 hour  Intake 919.86 ml  Output 1950 ml  Net -1030.14 ml   Filed Weights   06/11/21 0414 06/12/21 0425 06/13/21 0500  Weight: 128.4 kg 126.9 kg 128.6 kg    Examination:  General exam: Appears calm and comfortable  Respiratory system: Clear to auscultation. Respiratory effort normal.  2-3 L nasal cannula Cardiovascular system: S1 & S2 heard, irregular Gastrointestinal system: Abdomen is soft Central nervous system: Alert and awake Extremities: No edema Skin: No significant lesions noted Psychiatry: Flat affect.    Data Reviewed: I have personally reviewed following labs and imaging studies  CBC: Recent Labs  Lab 06/08/21 1936 06/09/21 0313 06/10/21 0420 06/11/21 0424 06/12/21 0401 06/13/21 0530  WBC 15.6* 14.0* 17.8* 13.4* 12.8* 14.4*  NEUTROABS 11.9*  --   --   --   --   --   HGB 14.0 12.3* 11.5* 11.9* 12.0* 11.4*  HCT 42.3 36.8* 35.6* 36.4* 37.3* 34.9*  MCV 97.7 99.5 101.4* 100.6* 101.6* 99.7  PLT 268 222 218 225 240 326   Basic Metabolic Panel: Recent Labs  Lab 06/09/21 0313 06/10/21 0420 06/11/21 0424 06/12/21 0401 06/13/21 0530  NA 136 136 137 138 138  K 4.1 5.1 4.2 4.0 3.9  CL 105 106 104 103 106  CO2 '24 23 25 27 25  '$ GLUCOSE 139* 180* 165* 153* 178*  BUN 34* 33* 38* 42* 43*  CREATININE 2.28* 2.19* 2.36* 2.53* 2.32*  CALCIUM 7.9* 7.9* 7.9* 7.9* 7.8*  MG 1.6* 1.9 1.9 2.0 2.1  PHOS 4.0  --   --   --   --    GFR: Estimated Creatinine Clearance: 35.6 mL/min (A) (by C-G formula based on SCr of 2.32 mg/dL (H)). Liver Function  Tests: Recent Labs  Lab 06/08/21 1936 06/09/21 0313 06/11/21 0424  AST 20 30 37  ALT '19 18 30  '$ ALKPHOS 44 36* 50  BILITOT 1.3* 0.9 1.0  PROT 8.0 6.8 6.9  ALBUMIN 3.7 3.1* 2.8*   No results for input(s): LIPASE, AMYLASE in the last 168 hours. No results for input(s): AMMONIA in the last 168 hours. Coagulation Profile: Recent Labs  Lab 06/08/21 1936  INR 1.3*   Cardiac Enzymes: No results for input(s): CKTOTAL, CKMB, CKMBINDEX, TROPONINI in the last 168 hours. BNP (last 3 results) No results for input(s): PROBNP in the last 8760 hours. HbA1C: No results for input(s): HGBA1C in the last 72 hours. CBG: Recent Labs  Lab 06/09/21 1015  GLUCAP 204*   Lipid Profile: No results for input(s): CHOL, HDL, LDLCALC, TRIG, CHOLHDL, LDLDIRECT in the last 72 hours. Thyroid Function Tests: Recent Labs    06/12/21 0401  TSH 0.796  Anemia Panel: No results for input(s): VITAMINB12, FOLATE, FERRITIN, TIBC, IRON, RETICCTPCT in the last 72 hours. Sepsis Labs: Recent Labs  Lab 06/08/21 1936 06/08/21 2127 06/09/21 0313 06/09/21 0555 06/10/21 0420 06/11/21 0424 06/12/21 0401  PROCALCITON  --   --  0.60  --  4.94 3.86 2.67  LATICACIDVEN 3.3* 2.1* 1.9 1.4  --   --   --     Recent Results (from the past 240 hour(s))  Blood Culture (routine x 2)     Status: None   Collection Time: 06/08/21  7:36 PM   Specimen: BLOOD  Result Value Ref Range Status   Specimen Description BLOOD RIGHT ANTECUBITAL  Final   Special Requests   Final    BOTTLES DRAWN AEROBIC AND ANAEROBIC Blood Culture adequate volume   Culture   Final    NO GROWTH 5 DAYS Performed at [facility], [address], [place], [place] [address]    Report Status 06/13/2021 FINAL  Final  Blood Culture (routine x 2)     Status: None   Collection Time: 06/08/21  7:48 PM   Specimen: BLOOD  Result Value Ref Range Status   Specimen Description BLOOD BLOOD LEFT HAND  Final   Special Requests   Final    BOTTLES DRAWN AEROBIC  AND ANAEROBIC Blood Culture results may not be optimal due to an inadequate volume of blood received in culture bottles   Culture   Final    NO GROWTH 5 DAYS Performed at [facility], [address] [place], [place] [address]    Report Status 06/13/2021 FINAL  Final  Resp Panel by RT-PCR (Flu A&B, Covid) Anterior Nasal Swab     Status: None   Collection Time: 06/08/21  8:10 PM   Specimen: Anterior Nasal Swab  Result Value Ref Range Status   SARS Coronavirus 2 by RT PCR NEGATIVE NEGATIVE Final    Comment: (NOTE) SARS-CoV-2 target nucleic acids are NOT DETECTED.  The SARS-CoV-2 RNA is generally detectable in upper respiratory specimens during the acute phase of infection. The lowest concentration of SARS-CoV-2 viral copies this assay can detect is 138 copies/mL. A negative result does not preclude SARS-Cov-2 infection and should not be used as the sole basis for treatment or other patient management decisions. A negative result may occur with  improper specimen collection/handling, submission of specimen other than nasopharyngeal swab, presence of viral mutation(s) within the areas targeted by this assay, and inadequate number of viral copies(<138 copies/mL). A negative result must be combined with clinical observations, patient history, and epidemiological information. The expected result is Negative.  Fact Sheet for Patients:  EntrepreneurPulse.com.au  Fact Sheet for Healthcare Providers:  IncredibleEmployment.be  This test is no t yet approved or cleared by the [place] FDA and  has been authorized for detection and/or diagnosis of SARS-CoV-2 by FDA under an Emergency Use Authorization (EUA). This EUA will remain  in effect (meaning this test can be used) for the duration of the COVID-19 declaration under Section 564(b)(1) of the Act, 21 U.S.C.section 360bbb-3(b)(1), unless the authorization is terminated  or revoked sooner.        Influenza A by PCR NEGATIVE NEGATIVE Final   Influenza B by PCR NEGATIVE NEGATIVE Final    Comment: (NOTE) The Xpert Xpress SARS-CoV-2/FLU/RSV plus assay is intended as an aid in the diagnosis of influenza from Nasopharyngeal swab specimens and should not be used as a sole basis for treatment. Nasal washings and aspirates are unacceptable for Xpert Xpress SARS-CoV-2/FLU/RSV  testing.  Fact Sheet for Patients: EntrepreneurPulse.com.au  Fact Sheet for Healthcare Providers: IncredibleEmployment.be  This test is not yet approved or cleared by the [place] FDA and has been authorized for detection and/or diagnosis of SARS-CoV-2 by FDA under an Emergency Use Authorization (EUA). This EUA will remain in effect (meaning this test can be used) for the duration of the COVID-19 declaration under Section 564(b)(1) of the Act, 21 U.S.C. section 360bbb-3(b)(1), unless the authorization is terminated or revoked.  Performed at [facility], [address], [place], [place] [address]   MRSA Next Gen by PCR, Nasal     Status: None   Collection Time: 06/09/21  9:14 AM   Specimen: Nasal Mucosa; Nasal Swab  Result Value Ref Range Status   MRSA by PCR Next Gen NOT DETECTED NOT DETECTED Final    Comment: (NOTE) The GeneXpert MRSA Assay (FDA approved for NASAL specimens only), is one component of a comprehensive MRSA colonization surveillance program. It is not intended to diagnose MRSA infection nor to guide or monitor treatment for MRSA infections. Test performance is not FDA approved in patients less than 63 years old. Performed at [facility], [address], [place], [place] [address]   Urine Culture     Status: Abnormal   Collection Time: 06/09/21 12:33 PM   Specimen: Urine, Clean Catch  Result Value Ref Range Status   Specimen Description   Final    URINE, CLEAN CATCH Performed at [facility], [address], [place], [place] [address]    Special  Requests   Final    NONE Performed at [facility], [address] [place], [place] [address]    Culture 40,000 COLONIES/mL ENTEROBACTER CLOACAE (A)  Final   Report Status 06/13/2021 FINAL  Final   Organism ID, Bacteria ENTEROBACTER CLOACAE (A)  Final      Susceptibility   Enterobacter cloacae - MIC*    CEFAZOLIN >=64 RESISTANT Resistant     CEFEPIME 2 SENSITIVE Sensitive     CIPROFLOXACIN 1 RESISTANT Resistant     GENTAMICIN <=1 SENSITIVE Sensitive     IMIPENEM 0.5 SENSITIVE Sensitive     NITROFURANTOIN 64 INTERMEDIATE Intermediate     TRIMETH/SULFA <=20 SENSITIVE Sensitive     PIP/TAZO >=128 RESISTANT Resistant     * 40,000 COLONIES/mL ENTEROBACTER CLOACAE  Culture, blood (Routine X 2) w Reflex to ID Panel     Status: None (Preliminary result)   Collection Time: 06/10/21  9:38 AM   Specimen: BLOOD LEFT HAND  Result Value Ref Range Status   Specimen Description   Final    BLOOD LEFT HAND BOTTLES DRAWN AEROBIC AND ANAEROBIC   Special Requests Blood Culture adequate volume  Final   Culture   Final    NO GROWTH 3 DAYS Performed at [facility], [address] [place], [place] [address]    Report Status PENDING  Incomplete  Culture, blood (Routine X 2) w Reflex to ID Panel     Status: None (Preliminary result)   Collection Time: 06/10/21  2:48 PM   Specimen: BLOOD RIGHT HAND  Result Value Ref Range Status   Specimen Description BLOOD RIGHT HAND  Final   Special Requests   Final    BOTTLES DRAWN AEROBIC AND ANAEROBIC Blood Culture adequate volume   Culture   Final    NO GROWTH 3 DAYS Performed at [facility], [address], [place], [place] [address]    Report Status PENDING  Incomplete  Radiology Studies: ECHOCARDIOGRAM COMPLETE  Result Date: 06/12/2021    ECHOCARDIOGRAM REPORT   Patient Name:   CAROLE DEERE [facility] Date of Exam: 06/12/2021 Medical Rec #:  [id number]         Height:       74.0 in Accession #:    [id number]        Weight:       279.8 lb Date of Birth:   02/23/1940         BSA:          2.507 m Patient Age:    22 years          BP:           108/63 mmHg Patient Gender: M                 HR:           99 bpm. Exam Location:  [facility] Procedure: 2D Echo, Color Doppler and Cardiac Doppler Indications:    I48.91* Unspecified atrial fibrillation  History:        Patient has prior history of Echocardiogram examinations, most                 recent 08/30/2015. CHF, Arrythmias:Atrial Fibrillation; Risk                 Factors:Hypertension.  Sonographer:    Raquel Sarna Senior RDCS Referring Phys: [phone] Royanne Foots [facility]  Sonographer Comments: Poor apical window due to lung interference IMPRESSIONS  1. Left ventricular ejection fraction, by estimation, is 60 to 65%. The left ventricle has normal function. The left ventricle has no regional wall motion abnormalities. Left ventricular diastolic function could not be evaluated.  2. Right ventricular systolic function is normal. The right ventricular size is normal. There is normal pulmonary artery systolic pressure. The estimated right ventricular systolic pressure is 10.9 mmHg.  3. Left atrial size was mildly dilated.  4. Right atrial size was mildly dilated.  5. The mitral valve is myxomatous. No evidence of mitral valve regurgitation. No evidence of mitral stenosis.  6. The aortic valve is tricuspid. There is mild calcification of the aortic valve. There is mild thickening of the aortic valve. Aortic valve regurgitation is not visualized. Aortic valve sclerosis/calcification is present, without any evidence of aortic stenosis.  7. There is mild dilatation of the ascending aorta, measuring 40 mm.  8. The inferior vena cava is normal in size with greater than 50% respiratory variability, suggesting right atrial pressure of 3 mmHg. Comparison(s): Prior images unable to be directly viewed, comparison made by report only. FINDINGS  Left Ventricle: Left ventricular ejection fraction, by estimation, is 60 to 65%. The left ventricle has  normal function. The left ventricle has no regional wall motion abnormalities. The left ventricular internal cavity size was normal in size. There is  no left ventricular hypertrophy. Left ventricular diastolic function could not be evaluated due to atrial fibrillation. Left ventricular diastolic function could not be evaluated. Right Ventricle: The right ventricular size is normal. No increase in right ventricular wall thickness. Right ventricular systolic function is normal. There is normal pulmonary artery systolic pressure. The tricuspid regurgitant velocity is 2.51 m/s, and  with an assumed right atrial pressure of 3 mmHg, the estimated right ventricular systolic pressure is 32.3 mmHg. Left Atrium: Left atrial size was mildly dilated. Right Atrium: Right atrial size was mildly dilated. Pericardium: There is no evidence of pericardial effusion. 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  There is moderate thickening of the mitral valve leaflet(s). Mild mitral annular calcification. No evidence of mitral valve regurgitation. No evidence of mitral valve stenosis. Tricuspid Valve: The tricuspid valve is normal in structure. Tricuspid valve regurgitation is mild. Aortic Valve: The aortic valve is tricuspid. There is mild calcification of the aortic valve. There is mild thickening of the aortic valve. Aortic valve regurgitation is not visualized. Aortic valve sclerosis/calcification is present, without any evidence of aortic stenosis. Pulmonic Valve: The pulmonic valve was normal in structure. Pulmonic valve regurgitation is not visualized. Aorta: The aortic root is normal in size and structure. There is mild dilatation of the ascending aorta, measuring 40 mm. Venous: The inferior vena cava is normal in size with greater than 50% respiratory variability, suggesting right atrial pressure of 3 mmHg. IAS/Shunts: No atrial level shunt detected by color flow Doppler.  LEFT VENTRICLE PLAX 2D LVIDd:         3.80 cm LVIDs:          2.50 cm LV PW:         1.00 cm LV IVS:        1.10 cm LVOT diam:     2.40 cm LV SV:         52 LV SV Index:   21 LVOT Area:     4.52 cm  RIGHT VENTRICLE RV S prime:     12.00 cm/s TAPSE (M-mode): 1.6 cm LEFT ATRIUM             Index        RIGHT ATRIUM           Index LA diam:        3.80 cm 1.52 cm/m   RA Area:     18.30 cm LA Vol (A2C):   79.9 ml 31.87 ml/m  RA Volume:   41.90 ml  16.72 ml/m LA Vol (A4C):   64.2 ml 25.61 ml/m LA Biplane Vol: 72.6 ml 28.96 ml/m  AORTIC VALVE LVOT Vmax:   72.70 cm/s LVOT Vmean:  49.600 cm/s LVOT VTI:    0.115 m  AORTA Ao Root diam: 3.60 cm Ao Asc diam:  4.00 cm TRICUSPID VALVE TR Peak grad:   25.2 mmHg TR Vmax:        251.00 cm/s  SHUNTS Systemic VTI:  0.12 m Systemic Diam: 2.40 cm Mihai Croitoru MD Electronically signed by [name] MD Signature Date/Time: 06/12/2021/3:39:49 PM    Final         Scheduled Meds:  Chlorhexidine Gluconate Cloth  6 each Topical Daily   dextromethorphan-guaiFENesin  1 tablet Oral BID   diltiazem  60 mg Oral Q6H   levothyroxine  100 mcg Oral Q0600   metoprolol tartrate  100 mg Oral BID   Rivaroxaban  15 mg Oral Q supper   traZODone  50 mg Oral QHS   Continuous Infusions:  ceFEPime (MAXIPIME) IV 2 g (06/13/21 1004)   diltiazem (CARDIZEM) infusion Stopped (06/11/21 2130)     LOS: 5 days    Time spent: 35 minutes    Sianni Cloninger Darleen Crocker, DO [facility]  If 7PM-7AM, please contact night-coverage [email or web address] 06/13/2021, 11:34 AM

## 2021-06-13 NOTE — Progress Notes (Signed)
  Transition of Care [facility]) Screening Note   Patient Details  Name: [name] [name] Date of Birth: Aug 30, 1940   Transition of Care [facility]) CM/SW Contact:    Ihor Gully, LCSW Phone Number: 06/13/2021, 2:31 PM    Transition of Care Department [facility]) has reviewed patient and no TOC needs have been identified at this time. We will continue to monitor patient advancement through interdisciplinary progression rounds. If new patient transition needs arise, please place a TOC consult.

## 2021-06-14 ENCOUNTER — Ambulatory Visit: Payer: PPO | Admitting: Urology

## 2021-06-14 ENCOUNTER — Inpatient Hospital Stay (HOSPITAL_COMMUNITY): Payer: PPO

## 2021-06-14 DIAGNOSIS — R6510 Systemic inflammatory response syndrome (SIRS) of non-infectious origin without acute organ dysfunction: Secondary | ICD-10-CM | POA: Diagnosis not present

## 2021-06-14 LAB — CBC
HCT: 34.6 % — ABNORMAL LOW (ref 39.0–52.0)
Hemoglobin: 11.1 g/dL — ABNORMAL LOW (ref 13.0–17.0)
MCH: 32.6 pg (ref 26.0–34.0)
MCHC: 32.1 g/dL (ref 30.0–36.0)
MCV: 101.8 fL — ABNORMAL HIGH (ref 80.0–100.0)
Platelets: 229 10*3/uL (ref 150–400)
RBC: 3.4 MIL/uL — ABNORMAL LOW (ref 4.22–5.81)
RDW: 13.7 % (ref 11.5–15.5)
WBC: 14.7 10*3/uL — ABNORMAL HIGH (ref 4.0–10.5)
nRBC: 0 % (ref 0.0–0.2)

## 2021-06-14 LAB — BASIC METABOLIC PANEL
Anion gap: 10 (ref 5–15)
BUN: 44 mg/dL — ABNORMAL HIGH (ref 8–23)
CO2: 25 mmol/L (ref 22–32)
Calcium: 7.6 mg/dL — ABNORMAL LOW (ref 8.9–10.3)
Chloride: 105 mmol/L (ref 98–111)
Creatinine, Ser: 2.2 mg/dL — ABNORMAL HIGH (ref 0.61–1.24)
GFR, Estimated: 29 mL/min — ABNORMAL LOW (ref >60)
Glucose, Bld: 167 mg/dL — ABNORMAL HIGH (ref 70–99)
Potassium: 4.1 mmol/L (ref 3.5–5.1)
Sodium: 140 mmol/L (ref 135–145)

## 2021-06-14 LAB — BRAIN NATRIURETIC PEPTIDE: B Natriuretic Peptide: 166 pg/mL — ABNORMAL HIGH (ref 0.0–100.0)

## 2021-06-14 LAB — MAGNESIUM: Magnesium: 2.1 mg/dL (ref 1.7–2.4)

## 2021-06-14 MED ORDER — DILTIAZEM HCL 30 MG PO TABS
30.0000 mg | ORAL_TABLET | Freq: Once | ORAL | Status: AC
Start: 2021-06-14 — End: 2021-06-14
  Administered 2021-06-14: 30 mg via ORAL
  Filled 2021-06-14: qty 1

## 2021-06-14 MED ORDER — DILTIAZEM HCL 60 MG PO TABS
90.0000 mg | ORAL_TABLET | Freq: Four times a day (QID) | ORAL | Status: DC
  Administered 2021-06-14 – 2021-06-16 (×9): 90 mg via ORAL
  Filled 2021-06-14 (×9): qty 1

## 2021-06-14 MED ORDER — FUROSEMIDE 10 MG/ML IJ SOLN
40.0000 mg | Freq: Once | INTRAMUSCULAR | Status: AC
  Administered 2021-06-14: 40 mg via INTRAVENOUS
  Filled 2021-06-14: qty 4

## 2021-06-14 NOTE — Progress Notes (Signed)
PROGRESS NOTE    [name] [name]  PRF:[id number] DOB: [birth year] DOA: 06/08/2021 PCP: [name], MD   Brief Narrative:    [name] [name] is a 81 y.o. male with medical history significant of atrial fibrillation, CKD stage IIIb, prostate cancer with bone metastasis, hypothyroidism, hypertension who presents to the emergency department due to sudden onset of weakness and malaise this afternoon.  He was admitted with concerns for sepsis and appears to be having severe sepsis secondary to UTI and possible Community-acquired pneumonia and had been started on Rocephin and azithromycin.  He is now noted to have Enterobacter UTI and has been changed to cefepime per sensitivities on urine culture.  He is also noted to have atrial fibrillation with RVR and has been weaned off of his Cardizem drip.  Oral Cardizem dose is being adjusted to help with heart rate management and he required some intermittent diuresis for interstitial lung edema.  PT recommending SNF placement and family deciding on SNF versus home health.  Assessment & Plan:   Principal Problem:   SIRS (systemic inflammatory response syndrome) (HCC) Active Problems:   Essential hypertension   Acute kidney injury superimposed on chronic kidney disease (HCC)   Hypothyroidism   Leukocytosis   Paroxysmal atrial fibrillation with RVR (HCC)   Lactic acidosis  Assessment and Plan:   Severe sepsis secondary to Enterobacter UTI with possible CAP, present on admission Blood cultures with no growth thus far and urine cultures ordered after initial antibiotics Patient was initially on Rocephin and azithromycin which was changed to Zosyn 6/2, now on cefepime per sensitivities on urine culture  Repeat blood culture 6/2 with no growth to date Continue Tylenol as needed Continue Mucinex, incentive spirometry, flutter valve    Acute hypoxemic respiratory failure secondary to cardiogenic pulmonary edema Status post dose of Lasix 6/2 with  improvement in symptoms; repeat dose 6/6 due to some edema on CXR Hold further IV fluid and monitor Wean oxygen as tolerated 2D echocardiogram 6/4 with LVEF 60-65% and no other acute findings   Leukocytosis possible secondary to above Currently downtrending, continue to follow Zosyn discontinued and changed to cefepime 6/5, day 2 on cefepime  Lactic acidosis possibly secondary to multifactorial Resolved  Paroxysmal A-fib with RVR-improved IV Cardizem discontinued Home Lopressor restarted and oral Cardizem added; dose increased to '90mg'$  q6 hours on 6/6. If not improving, consider Cards consult Continue Xarelto Echocardiogram as noted above and TSH within normal limits Okay for transfer to telemetry today  Acute kidney injury on CKD stage IV BUN/creatinine 36/2.54 on admission (baseline creatinine at 2.0), trending up after Lasix administration Hold further IV hydration and monitor Renally adjust medications, avoid nephrotoxic agents/dehydration/hypotension   Hypothyroidism Continue Synthroid  Essential hypertension Continue metoprolol Oral Cardizem initiated   Prostate cx with metastasis Currently on treatment with urology and follows up outpatient for this     DVT prophylaxis: Xarelto Code Status: DNR Family Communication: Discussed with daughter-in-law at bedside 6/6 Disposition Plan:  Status is: Inpatient Remains inpatient appropriate because: IV medications   Consultants:  None   Procedures:  None   Antimicrobials:  Anti-infectives (From admission, onward)    Start     Dose/Rate Route Frequency Ordered Stop   06/13/21 1000  ceFEPIme (MAXIPIME) 2 g in sodium chloride 0.9 % 100 mL IVPB        2 g 200 mL/hr over 30 Minutes Intravenous Every 12 hours 06/13/21 0902     06/10/21 0930  piperacillin-tazobactam (ZOSYN) IVPB 3.375 g  Status:  Discontinued        3.375 g 12.5 mL/hr over 240 Minutes Intravenous Every 8 hours 06/10/21 0838 06/13/21 0902   06/08/21 2000   cefTRIAXone (ROCEPHIN) 2 g in sodium chloride 0.9 % 100 mL IVPB  Status:  Discontinued        2 g 200 mL/hr over 30 Minutes Intravenous Every 24 hours 06/08/21 1946 06/10/21 0821   06/08/21 2000  azithromycin (ZITHROMAX) 500 mg in sodium chloride 0.9 % 250 mL IVPB  Status:  Discontinued        500 mg 250 mL/hr over 60 Minutes Intravenous Every 24 hours 06/08/21 1946 06/10/21 3267       Subjective: Patient seen and evaluated today with no new acute complaints or concerns. No acute concerns or events noted overnight.  He denies any chest pain or shortness of breath or palpitations.  Heart rates remain in the 100-110 bpm range.  He is starting to require higher amounts of oxygen and is currently on 5 L nasal cannula.  Objective: Vitals:   06/14/21 1300 06/14/21 1405 06/14/21 1500 06/14/21 1510  BP: (!) 99/56 (!) 96/45 101/62   Pulse: 88 90 85 90  Resp: (!) 24  (!) 29 (!) 29  Temp:      TempSrc:      SpO2: 93% 90% (!) 89% 90%  Weight:      Height:        Intake/Output Summary (Last 24 hours) at 06/14/2021 1538 Last data filed at 06/14/2021 1200 Gross per 24 hour  Intake 1170 ml  Output 1950 ml  Net -780 ml   Filed Weights   06/12/21 0425 06/13/21 0500 06/14/21 0606  Weight: 126.9 kg 128.6 kg 124.4 kg    Examination:  General exam: Appears calm and comfortable  Respiratory system: Clear to auscultation. Respiratory effort normal.  5 L nasal cannula Cardiovascular system: S1 & S2 heard, irregular and tachycardic Gastrointestinal system: Abdomen is soft Central nervous system: Alert and awake Extremities: No edema Skin: No significant lesions noted Psychiatry: Flat affect.    Data Reviewed: I have personally reviewed following labs and imaging studies  CBC: Recent Labs  Lab 06/08/21 1936 06/09/21 0313 06/10/21 0420 06/11/21 0424 06/12/21 0401 06/13/21 0530 06/14/21 0325  WBC 15.6*   < > 17.8* 13.4* 12.8* 14.4* 14.7*  NEUTROABS 11.9*  --   --   --   --   --   --    HGB 14.0   < > 11.5* 11.9* 12.0* 11.4* 11.1*  HCT 42.3   < > 35.6* 36.4* 37.3* 34.9* 34.6*  MCV 97.7   < > 101.4* 100.6* 101.6* 99.7 101.8*  PLT 268   < > 218 225 240 228 229   < > = values in this interval not displayed.   Basic Metabolic Panel: Recent Labs  Lab 06/09/21 0313 06/10/21 0420 06/11/21 0424 06/12/21 0401 06/13/21 0530 06/14/21 0325  NA 136 136 137 138 138 140  K 4.1 5.1 4.2 4.0 3.9 4.1  CL 105 106 104 103 106 105  CO2 '24 23 25 27 25 25  '$ GLUCOSE 139* 180* 165* 153* 178* 167*  BUN 34* 33* 38* 42* 43* 44*  CREATININE 2.28* 2.19* 2.36* 2.53* 2.32* 2.20*  CALCIUM 7.9* 7.9* 7.9* 7.9* 7.8* 7.6*  MG 1.6* 1.9 1.9 2.0 2.1 2.1  PHOS 4.0  --   --   --   --   --    GFR: Estimated Creatinine Clearance: 36.9 mL/min (A) (  by C-G formula based on SCr of 2.2 mg/dL (H)). Liver Function Tests: Recent Labs  Lab 06/08/21 1936 06/09/21 0313 06/11/21 0424  AST 20 30 37  ALT '19 18 30  '$ ALKPHOS 44 36* 50  BILITOT 1.3* 0.9 1.0  PROT 8.0 6.8 6.9  ALBUMIN 3.7 3.1* 2.8*   No results for input(s): LIPASE, AMYLASE in the last 168 hours. No results for input(s): AMMONIA in the last 168 hours. Coagulation Profile: Recent Labs  Lab 06/08/21 1936  INR 1.3*   Cardiac Enzymes: No results for input(s): CKTOTAL, CKMB, CKMBINDEX, TROPONINI in the last 168 hours. BNP (last 3 results) No results for input(s): PROBNP in the last 8760 hours. HbA1C: No results for input(s): HGBA1C in the last 72 hours. CBG: Recent Labs  Lab 06/09/21 1015  GLUCAP 204*   Lipid Profile: No results for input(s): CHOL, HDL, LDLCALC, TRIG, CHOLHDL, LDLDIRECT in the last 72 hours. Thyroid Function Tests: Recent Labs    06/12/21 0401  TSH 0.796   Anemia Panel: No results for input(s): VITAMINB12, FOLATE, FERRITIN, TIBC, IRON, RETICCTPCT in the last 72 hours. Sepsis Labs: Recent Labs  Lab 06/08/21 1936 06/08/21 2127 06/09/21 0313 06/09/21 0555 06/10/21 0420 06/11/21 0424 06/12/21 0401   PROCALCITON  --   --  0.60  --  4.94 3.86 2.67  LATICACIDVEN 3.3* 2.1* 1.9 1.4  --   --   --     Recent Results (from the past 240 hour(s))  Blood Culture (routine x 2)     Status: None   Collection Time: 06/08/21  7:36 PM   Specimen: BLOOD  Result Value Ref Range Status   Specimen Description BLOOD RIGHT ANTECUBITAL  Final   Special Requests   Final    BOTTLES DRAWN AEROBIC AND ANAEROBIC Blood Culture adequate volume   Culture   Final    NO GROWTH 5 DAYS Performed at [facility], [address] [place], [place] [address]    Report Status 06/13/2021 FINAL  Final  Blood Culture (routine x 2)     Status: None   Collection Time: 06/08/21  7:48 PM   Specimen: BLOOD  Result Value Ref Range Status   Specimen Description BLOOD BLOOD LEFT HAND  Final   Special Requests   Final    BOTTLES DRAWN AEROBIC AND ANAEROBIC Blood Culture results may not be optimal due to an inadequate volume of blood received in culture bottles   Culture   Final    NO GROWTH 5 DAYS Performed at [facility], [address] [place], [place] [address]    Report Status 06/13/2021 FINAL  Final  Resp Panel by RT-PCR (Flu A&B, Covid) Anterior Nasal Swab     Status: None   Collection Time: 06/08/21  8:10 PM   Specimen: Anterior Nasal Swab  Result Value Ref Range Status   SARS Coronavirus 2 by RT PCR NEGATIVE NEGATIVE Final    Comment: (NOTE) SARS-CoV-2 target nucleic acids are NOT DETECTED.  The SARS-CoV-2 RNA is generally detectable in upper respiratory specimens during the acute phase of infection. The lowest concentration of SARS-CoV-2 viral copies this assay can detect is 138 copies/mL. A negative result does not preclude SARS-Cov-2 infection and should not be used as the sole basis for treatment or other patient management decisions. A negative result may occur with  improper specimen collection/handling, submission of specimen other than nasopharyngeal swab, presence of viral mutation(s) within  the areas targeted by this assay, and inadequate number of viral copies(<138 copies/mL). A  negative result must be combined with clinical observations, patient history, and epidemiological information. The expected result is Negative.  Fact Sheet for Patients:  EntrepreneurPulse.com.au  Fact Sheet for Healthcare Providers:  IncredibleEmployment.be  This test is no t yet approved or cleared by the [place] FDA and  has been authorized for detection and/or diagnosis of SARS-CoV-2 by FDA under an Emergency Use Authorization (EUA). This EUA will remain  in effect (meaning this test can be used) for the duration of the COVID-19 declaration under Section 564(b)(1) of the Act, 21 U.S.C.section 360bbb-3(b)(1), unless the authorization is terminated  or revoked sooner.       Influenza A by PCR NEGATIVE NEGATIVE Final   Influenza B by PCR NEGATIVE NEGATIVE Final    Comment: (NOTE) The Xpert Xpress SARS-CoV-2/FLU/RSV plus assay is intended as an aid in the diagnosis of influenza from Nasopharyngeal swab specimens and should not be used as a sole basis for treatment. Nasal washings and aspirates are unacceptable for Xpert Xpress SARS-CoV-2/FLU/RSV testing.  Fact Sheet for Patients: EntrepreneurPulse.com.au  Fact Sheet for Healthcare Providers: IncredibleEmployment.be  This test is not yet approved or cleared by the [place] FDA and has been authorized for detection and/or diagnosis of SARS-CoV-2 by FDA under an Emergency Use Authorization (EUA). This EUA will remain in effect (meaning this test can be used) for the duration of the COVID-19 declaration under Section 564(b)(1) of the Act, 21 U.S.C. section 360bbb-3(b)(1), unless the authorization is terminated or revoked.  Performed at [facility], [address] [place], [place] [address]   MRSA Next Gen by PCR, Nasal     Status: None   Collection  Time: 06/09/21  9:14 AM   Specimen: Nasal Mucosa; Nasal Swab  Result Value Ref Range Status   MRSA by PCR Next Gen NOT DETECTED NOT DETECTED Final    Comment: (NOTE) The GeneXpert MRSA Assay (FDA approved for NASAL specimens only), is one component of a comprehensive MRSA colonization surveillance program. It is not intended to diagnose MRSA infection nor to guide or monitor treatment for MRSA infections. Test performance is not FDA approved in patients less than 37 years old. Performed at [facility], [address] [place], [place] [address]   Urine Culture     Status: Abnormal   Collection Time: 06/09/21 12:33 PM   Specimen: Urine, Clean Catch  Result Value Ref Range Status   Specimen Description   Final    URINE, CLEAN CATCH Performed at [facility], [address], [place], [place] [address]    Special Requests   Final    NONE Performed at [facility], [address], [place], [place] [address]    Culture 40,000 COLONIES/mL ENTEROBACTER CLOACAE (A)  Final   Report Status 06/13/2021 FINAL  Final   Organism ID, Bacteria ENTEROBACTER CLOACAE (A)  Final      Susceptibility   Enterobacter cloacae - MIC*    CEFAZOLIN >=64 RESISTANT Resistant     CEFEPIME 2 SENSITIVE Sensitive     CIPROFLOXACIN 1 RESISTANT Resistant     GENTAMICIN <=1 SENSITIVE Sensitive     IMIPENEM 0.5 SENSITIVE Sensitive     NITROFURANTOIN 64 INTERMEDIATE Intermediate     TRIMETH/SULFA <=20 SENSITIVE Sensitive     PIP/TAZO >=128 RESISTANT Resistant     * 40,000 COLONIES/mL ENTEROBACTER CLOACAE  Culture, blood (Routine X 2) w Reflex to ID Panel     Status: None (Preliminary result)   Collection Time: 06/10/21  9:38 AM   Specimen:  BLOOD LEFT HAND  Result Value Ref Range Status   Specimen Description   Final    BLOOD LEFT HAND BOTTLES DRAWN AEROBIC AND ANAEROBIC   Special Requests Blood Culture adequate volume  Final   Culture   Final    NO GROWTH 4 DAYS Performed at [facility], [address],  [place], [place] [address]    Report Status PENDING  Incomplete  Culture, blood (Routine X 2) w Reflex to ID Panel     Status: None (Preliminary result)   Collection Time: 06/10/21  2:48 PM   Specimen: BLOOD RIGHT HAND  Result Value Ref Range Status   Specimen Description BLOOD RIGHT HAND  Final   Special Requests   Final    BOTTLES DRAWN AEROBIC AND ANAEROBIC Blood Culture adequate volume   Culture   Final    NO GROWTH 4 DAYS Performed at [facility], [address], [place], [place] [address]    Report Status PENDING  Incomplete         Radiology Studies: DG CHEST PORT 1 VIEW  Result Date: 06/14/2021 CLINICAL DATA:  Shortness of breath EXAM: PORTABLE CHEST 1 VIEW COMPARISON:  06/10/2021 FINDINGS: Stable cardiomediastinal contours. Aortic atherosclerosis. Chronic elevation of the right hemidiaphragm with right basilar atelectasis. Slightly increasing interstitial markings bilaterally. No pleural effusion or pneumothorax. IMPRESSION: Slightly increasing interstitial markings bilaterally, could reflect edema or atypical/viral infection. Electronically Signed   By: Davina Poke D.O.   On: 06/14/2021 11:02        Scheduled Meds:  Chlorhexidine Gluconate Cloth  6 each Topical Daily   dextromethorphan-guaiFENesin  1 tablet Oral BID   diltiazem  90 mg Oral Q6H   levothyroxine  100 mcg Oral Q0600   metoprolol tartrate  100 mg Oral BID   Rivaroxaban  15 mg Oral Q supper   traZODone  50 mg Oral QHS   Continuous Infusions:  ceFEPime (MAXIPIME) IV 2 g (06/14/21 0817)   diltiazem (CARDIZEM) infusion Stopped (06/11/21 2130)     LOS: 6 days    Time spent: 35 minutes    Tylasia Fletchall Darleen Crocker, DO [facility]  If 7PM-7AM, please contact night-coverage [email or web address] 06/14/2021, 3:38 PM

## 2021-06-14 NOTE — Plan of Care (Signed)
  Problem: Acute Rehab PT Goals(only PT should resolve) Goal: Pt Will Go Supine/Side To Sit Outcome: Progressing Flowsheets (Taken 06/14/2021 1352) Pt will go Supine/Side to Sit: with modified independence Goal: Patient Will Transfer Sit To/From Stand Outcome: Progressing Flowsheets (Taken 06/14/2021 1352) Patient will transfer sit to/from stand:  with min guard assist  with minimal assist Goal: Pt Will Transfer Bed To Chair/Chair To Bed Outcome: Progressing Flowsheets (Taken 06/14/2021 1352) Pt will Transfer Bed to Chair/Chair to Bed:  min guard assist  with min assist Goal: Pt Will Ambulate Outcome: Progressing Flowsheets (Taken 06/14/2021 1352) Pt will Ambulate:  25 feet  with minimal assist  with rolling walker   1:53 PM, 06/14/21 Lonell Grandchild, MPT Physical Therapist with [facility] [phone] office [phone] mobile phone

## 2021-06-14 NOTE — Evaluation (Signed)
Physical Therapy Evaluation Patient Details Name: [name] [name] MRN: [id number] DOB: [birth year] Today's Date: 06/14/2021  History of Present Illness  [name] [name] is a 81 y.o. male with medical history significant of atrial fibrillation, CKD stage IIIb, prostate cancer with bone metastasis, hypothyroidism, hypertension who presents to the emergency department due to sudden onset of weakness and malaise this afternoon.  Patient states that he was in his normal state of health this morning and was without any complaints, but in the afternoon, around 3-4 PM, he suddenly felt weak and febrile and developed a nonproductive cough, so he went to bed.  Son checked on him and saw that he felt sick, daughter-in-law (who was a Marine scientist) checked his temperature and it was 103.101F, so EMS was activated and patient was sent to the ED for further evaluation and management.  He denies chest pain, headache, vomiting, nausea, abdominal pain, diarrhea or constipation.   Clinical Impression  Patient present seated in chair (assisted by nursing staff) and agreeable for therapy.  Patient demonstrates slow labored movement for completing sit to stands, transfers to bed/BSC, very unsteady on feet and limited to a few steps at bedside due to generalized weakness and poor standing balance.  Patient has to lean on nearby objects for support during transfers without use of AD, safer using RW and able to transfer to [facility] to have a bowel movement and later tolerated staying in chair after therapy.  Patient will benefit from continued skilled physical therapy in hospital and recommended venue below to increase strength, balance, endurance for safe ADLs and gait.        Recommendations for follow up therapy are one component of a multi-disciplinary discharge planning process, led by the attending physician.  Recommendations may be updated based on patient status, additional functional criteria and insurance authorization.  Follow  Up Recommendations Skilled nursing-short term rehab (<3 hours/day)    Assistance Recommended at Discharge Set up Supervision/Assistance  Patient can return home with the following  A lot of help with walking and/or transfers;A little help with bathing/dressing/bathroom;Assistance with cooking/housework;Help with stairs or ramp for entrance    Equipment Recommendations None recommended by PT  Recommendations for Other Services       Functional Status Assessment Patient has had a recent decline in their functional status and demonstrates the ability to make significant improvements in function in a reasonable and predictable amount of time.     Precautions / Restrictions Precautions Precautions: Fall Restrictions Weight Bearing Restrictions: No      Mobility  Bed Mobility Overal bed mobility: Needs Assistance Bed Mobility: Supine to Sit, Sit to Supine     Supine to sit: Supervision Sit to supine: Supervision   General bed mobility comments: increased time, labored movement    Transfers Overall transfer level: Needs assistance Equipment used: Rolling walker (2 wheels) Transfers: Sit to/from Stand, Bed to chair/wheelchair/BSC Sit to Stand: Min assist, Mod assist   Step pivot transfers: Min assist, Mod assist       General transfer comment: slow labored unsteady movement    Ambulation/Gait Ambulation/Gait assistance: Mod assist Gait Distance (Feet): 4 Feet Assistive device: Rolling walker (2 wheels) Gait Pattern/deviations: Decreased step length - right, Decreased step length - left, Decreased stride length, Trunk flexed Gait velocity: decreased     General Gait Details: limited to a few slow labored unsteady steps at bedside with flexed trunk and fatigues easily due to SOB and weakness, on 2 LPM with SpO2 at 89-93%  Stairs  Wheelchair Mobility    Modified Rankin (Stroke Patients Only)       Balance Overall balance assessment: Needs  assistance Sitting-balance support: Feet supported, No upper extremity supported Sitting balance-Leahy Scale: Fair Sitting balance - Comments: fair/good seated at EOB   Standing balance support: During functional activity, No upper extremity supported Standing balance-Leahy Scale: Poor Standing balance comment: fair using RW                             Pertinent Vitals/Pain Pain Assessment Pain Assessment: No/denies pain    Home Living Family/patient expects to be discharged to:: Private residence Living Arrangements: Alone Available Help at Discharge: Family;Available 24 hours/day Type of Home: Mobile home Home Access: Stairs to enter Entrance Stairs-Rails: Right;Left;Can reach both Entrance Stairs-Number of Steps: 4   Home Layout: One level Home Equipment: Conservation officer, nature (2 wheels);Cane - single point;BSC/3in1      Prior Function Prior Level of Function : Independent/Modified Independent             Mobility Comments: household and short distanced community ambulator using [facility] ADLs Comments: Assisted by family     Hand Dominance   Dominant Hand: Left    Extremity/Trunk Assessment   Upper Extremity Assessment Upper Extremity Assessment: Generalized weakness    Lower Extremity Assessment Lower Extremity Assessment: Generalized weakness    Cervical / Trunk Assessment Cervical / Trunk Assessment: Normal  Communication   Communication: No difficulties  Cognition Arousal/Alertness: Awake/alert Behavior During Therapy: WFL for tasks assessed/performed Overall Cognitive Status: Within Functional Limits for tasks assessed                                          General Comments      Exercises     Assessment/Plan    PT Assessment Patient needs continued PT services  PT Problem List Decreased strength;Decreased activity tolerance;Decreased balance;Decreased mobility       PT Treatment Interventions DME instruction;Gait  training;Stair training;Functional mobility training;Therapeutic activities;Therapeutic exercise;Patient/family education;Balance training    PT Goals (Current goals can be found in the Care Plan section)  Acute Rehab PT Goals Patient Stated Goal: return home with family to assist PT Goal Formulation: With patient Time For Goal Achievement: 06/28/21 Potential to Achieve Goals: Good    Frequency Min 3X/week     Co-evaluation               AM-PAC PT "6 Clicks" Mobility  Outcome Measure Help needed turning from your back to your side while in a flat bed without using bedrails?: None Help needed moving from lying on your back to sitting on the side of a flat bed without using bedrails?: A Little Help needed moving to and from a bed to a chair (including a wheelchair)?: A Lot Help needed standing up from a chair using your arms (e.g., wheelchair or bedside chair)?: A Little Help needed to walk in hospital room?: A Lot Help needed climbing 3-5 steps with a railing? : A Lot 6 Click Score: 16    End of Session   Activity Tolerance: Patient limited by fatigue Patient left: in chair;with call bell/phone within reach Nurse Communication: Mobility status PT Visit Diagnosis: Unsteadiness on feet (R26.81);Other abnormalities of gait and mobility (R26.89);Muscle weakness (generalized) (M62.81)    Time: 4098-1191 PT Time Calculation (min) (ACUTE ONLY): 37 min  Charges:    PT Treatments $PT Eval Moderate Complexity: 1 MOD $Therapeutic Activity: 23-37 mins         1:51 PM, 06/14/21 Lonell Grandchild, MPT Physical Therapist with [facility] [phone] office [phone] mobile phone

## 2021-06-14 NOTE — [place] FL2 (Signed)
[place] MEDICAID FL2 LEVEL OF CARE SCREENING TOOL     IDENTIFICATION  Patient Name: [name] [name] Birthdate: 1940-09-24 Sex: male Admission Date (Current Location): 06/08/2021  [facility] and [place] Number:  [employer] and Address:  [place] [address] [place]      Provider Number: [id number]  Attending Physician Name and Address:  Rodena Goldmann, DO  Relative Name and Phone Number:  Horton, [place])   [phone]    Current Level of Care: Hospital Recommended Level of Care: [place] Prior Approval Number:    Date Approved/Denied:   PASRR Number: [id number]  Discharge Plan: SNF    Current Diagnoses: Patient Active Problem List   Diagnosis Date Noted   Leukocytosis 06/09/2021   Paroxysmal atrial fibrillation with RVR ([place]) 06/09/2021   Lactic acidosis 06/09/2021   SIRS (systemic inflammatory response syndrome) ([place]) 06/08/2021   Primary localized osteoarthritis of left hip 05/02/2016   Guaiac positive stools 01/17/2016   Prediabetes 12/24/2015   Nephrolithiasis 12/20/2015   Protein-calorie malnutrition, severe 10/01/2015   Hypokalemia 09/22/2015   Warfarin-induced coagulopathy ([place]) 09/22/2015   Constipation 09/22/2015   Hypothyroidism 09/22/2015   SBO (small bowel obstruction) ([place]) 09/22/2015   Incarcerated right inguinal hernia 09/22/2015   Orthostatic syncope 09/18/2015   Acute kidney injury superimposed on chronic kidney disease ([place]) 09/18/2015   Dehydration 09/18/2015   Cardiomyopathy ([place])    Hypocalcemia 12/31/2014   Vitamin D deficiency 12/31/2014   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[place]) 08/31/2014   Essential hypertension 02/11/2013   CKD (chronic kidney disease) stage 3, GFR 30-59 ml/min (HCC) 02/11/2013   UARS (upper airway resistance syndrome) 02/11/2013   Atrial fibrillation ([place]) 03/15/2012   Long term current use of anticoagulant therapy 03/15/2012    Orientation RESPIRATION  BLADDER Height & Weight     Self  O2 (3L) Continent Weight: 274 lb 4 oz (124.4 kg) Height:  '6\' 2"'$  (188 cm)  BEHAVIORAL SYMPTOMS/MOOD NEUROLOGICAL BOWEL NUTRITION STATUS      Continent Diet (heart healthy/carb modified)  AMBULATORY STATUS COMMUNICATION OF NEEDS Skin   Limited Assist Verbally Normal                       Personal Care Assistance Level of Assistance  Feeding, Bathing, Dressing Bathing Assistance: Limited assistance Feeding assistance: Independent Dressing Assistance: Limited assistance     Functional Limitations Info  Sight, Hearing, Speech Sight Info: Adequate Hearing Info: Adequate Speech Info: Adequate    SPECIAL CARE FACTORS FREQUENCY  PT (By licensed PT)     PT Frequency: 5x/week              Contractures Contractures Info: Not present    Additional Factors Info  Code Status, Allergies Code Status Info: DNR Allergies Info: NKA           Current Medications (06/14/2021):  This is the current hospital active medication list Current Facility-Administered Medications  Medication Dose Route Frequency Provider Last Rate Last Admin   acetaminophen (TYLENOL) tablet 650 mg  650 mg Oral Q6H PRN Adefeso, Oladapo, DO   650 mg at 06/09/21 1027   Or   acetaminophen (TYLENOL) suppository 650 mg  650 mg Rectal Q6H PRN Adefeso, Oladapo, DO       ceFEPIme (MAXIPIME) 2 g in sodium chloride 0.9 % 100 mL IVPB  2 g Intravenous Q12H Shah, Pratik D, DO 200 mL/hr at 06/14/21 0817 2 g at 06/14/21 0817   Chlorhexidine Gluconate Cloth  2 % PADS 6 each  6 each Topical Daily Heath Lark D, DO   6 each at 06/14/21 0814   dextromethorphan-guaiFENesin (MUCINEX DM) 30-600 MG per 12 hr tablet 1 tablet  1 tablet Oral BID Adefeso, Oladapo, DO   1 tablet at 06/14/21 0813   diltiazem (CARDIZEM) 125 mg in dextrose 5% 125 mL (1 mg/mL) infusion  5-15 mg/hr Intravenous Continuous Adefeso, Oladapo, DO   Stopped at 06/11/21 2130   diltiazem (CARDIZEM) tablet 90 mg  90 mg Oral Q6H  Shah, Pratik D, DO   90 mg at 06/14/21 1119   levalbuterol (XOPENEX) nebulizer solution 0.63 mg  0.63 mg Nebulization Q6H PRN Heath Lark D, DO   0.63 mg at 06/11/21 1521   levothyroxine (SYNTHROID) tablet 100 mcg  100 mcg Oral Q0600 Adefeso, Oladapo, DO   100 mcg at 06/14/21 0602   metoprolol tartrate (LOPRESSOR) tablet 100 mg  100 mg Oral BID Manuella Ghazi, Pratik D, DO   100 mg at 06/14/21 0813   ondansetron (ZOFRAN) tablet 4 mg  4 mg Oral Q6H PRN Adefeso, Oladapo, DO       Or   ondansetron (ZOFRAN) injection 4 mg  4 mg Intravenous Q6H PRN Adefeso, Oladapo, DO       Rivaroxaban (XARELTO) tablet 15 mg  15 mg Oral Q supper Adefeso, Oladapo, DO   15 mg at 06/13/21 1703   traZODone (DESYREL) tablet 50 mg  50 mg Oral QHS Shah, Pratik D, DO   50 mg at 06/13/21 2205   Facility-Administered Medications Ordered in Other Encounters  Medication Dose Route Frequency Provider Last Rate Last Admin   neomycin-polymyxin-dexameth (MAXITROL) 0.1 % ophth ointment    PRN [name], MD   1 application. at 04/18/11 7672     Discharge Medications: Please see discharge summary for a list of discharge medications.  Relevant Imaging Results:  Relevant Lab Results:   Additional Information SSN: [id number]. Per son, patient is vaccinated for COVID.  Juanisha Bautch, Clydene Pugh, LCSW

## 2021-06-14 NOTE — Progress Notes (Addendum)
Update: son returned contact and states to refer to SNF while they are considering. Referrals made. Auth started.   Spoke with son, [name], discussed PT recommendations. [name] states that he will likely want patient to come home with [facility] but wants to speak with his brother before making the decision. Patient was previously at [facility] in 2017 and upon d/c he lived with [name] for several months. Patient lives home alone, however [name] lives  .2 mile down the road from patient.  [name] to return contact with [facility] after speaking with his brother regarding d/c plan.     Chaelyn Bunyan, Clydene Pugh, LCSW

## 2021-06-15 DIAGNOSIS — R6510 Systemic inflammatory response syndrome (SIRS) of non-infectious origin without acute organ dysfunction: Secondary | ICD-10-CM | POA: Diagnosis not present

## 2021-06-15 DIAGNOSIS — A419 Sepsis, unspecified organism: Secondary | ICD-10-CM

## 2021-06-15 DIAGNOSIS — I4891 Unspecified atrial fibrillation: Secondary | ICD-10-CM | POA: Diagnosis not present

## 2021-06-15 DIAGNOSIS — N179 Acute kidney failure, unspecified: Secondary | ICD-10-CM | POA: Diagnosis not present

## 2021-06-15 LAB — CBC
HCT: 35.8 % — ABNORMAL LOW (ref 39.0–52.0)
Hemoglobin: 11.2 g/dL — ABNORMAL LOW (ref 13.0–17.0)
MCH: 32.2 pg (ref 26.0–34.0)
MCHC: 31.3 g/dL (ref 30.0–36.0)
MCV: 102.9 fL — ABNORMAL HIGH (ref 80.0–100.0)
Platelets: 280 10*3/uL (ref 150–400)
RBC: 3.48 MIL/uL — ABNORMAL LOW (ref 4.22–5.81)
RDW: 13.7 % (ref 11.5–15.5)
WBC: 17.8 10*3/uL — ABNORMAL HIGH (ref 4.0–10.5)
nRBC: 0.1 % (ref 0.0–0.2)

## 2021-06-15 LAB — CULTURE, BLOOD (ROUTINE X 2)
Culture: NO GROWTH
Culture: NO GROWTH
Special Requests: ADEQUATE
Special Requests: ADEQUATE

## 2021-06-15 LAB — BASIC METABOLIC PANEL
Anion gap: 8 (ref 5–15)
BUN: 47 mg/dL — ABNORMAL HIGH (ref 8–23)
CO2: 26 mmol/L (ref 22–32)
Calcium: 7.7 mg/dL — ABNORMAL LOW (ref 8.9–10.3)
Chloride: 106 mmol/L (ref 98–111)
Creatinine, Ser: 2.2 mg/dL — ABNORMAL HIGH (ref 0.61–1.24)
GFR, Estimated: 29 mL/min — ABNORMAL LOW (ref >60)
Glucose, Bld: 182 mg/dL — ABNORMAL HIGH (ref 70–99)
Potassium: 3.9 mmol/L (ref 3.5–5.1)
Sodium: 140 mmol/L (ref 135–145)

## 2021-06-15 LAB — MAGNESIUM: Magnesium: 2.3 mg/dL (ref 1.7–2.4)

## 2021-06-15 NOTE — Progress Notes (Signed)
PROGRESS NOTE    [name] [name]  AOZ:[id number] DOB: March 25, 1940 DOA: 06/08/2021 PCP: [name], MD   Brief Narrative:    [name] [name] is a 81 y.o. male with medical history significant of atrial fibrillation, CKD stage IIIb, prostate cancer with bone metastasis, hypothyroidism, hypertension who presents to the emergency department due to sudden onset of weakness and malaise this afternoon.  He was admitted with concerns for sepsis and appears to be having severe sepsis secondary to UTI and possible Community-acquired pneumonia and had been started on Rocephin and azithromycin.  He is now noted to have Enterobacter UTI and has been changed to cefepime per sensitivities on urine culture.  He is also noted to have atrial fibrillation with RVR and has been weaned off of his Cardizem drip.  Oral Cardizem dose is being adjusted to help with heart rate management and he required some intermittent diuresis for interstitial lung edema.  PT recommending SNF placement and family deciding on SNF versus home health.  Assessment & Plan:   Principal Problem:   SIRS (systemic inflammatory response syndrome) (HCC) Active Problems:   Essential hypertension   Acute kidney injury superimposed on chronic kidney disease (HCC)   Hypothyroidism   Leukocytosis   Paroxysmal atrial fibrillation with RVR (HCC)   Lactic acidosis  Assessment and Plan:   Severe sepsis secondary to Enterobacter UTI with possible CAP, present on admission -Blood cultures with no growth thus far and urine cultures ordered after initial antibiotics -Patient was initially on Rocephin and azithromycin which was changed to Zosyn 6/2, now on cefepime per sensitivities on urine culture  -Repeat blood culture 6/2 with no growth to date -Continue Tylenol as needed -Continue Mucinex, incentive spirometry, flutter valve  -Follow clinical response.   Acute hypoxemic respiratory failure secondary to cardiogenic pulmonary  edema -Status post 2 doses of IV Lasix (6/2 and 6/6) -Continue to hold IV diuretics-hold oral hydration. -Continue to wean off oxygen supplementation as tolerated -Currently on 4 L. -2D echocardiogram 6/4 with LVEF 60-65% and no other acute findings   Leukocytosis possible secondary to above -Currently downtrending, continue to follow -Zosyn discontinued and changed to cefepime 6/5, day 2 on cefepime. -Follow clinical response.  Lactic acidosis possibly secondary to multifactorial -Resolved -Maintain adequate hydration.  Paroxysmal A-fib with RVR-improved -Continue Lopressor and oral Cardizem -Continue Xarelto for secondary prevention -Continue telemetry monitoring. -TSH within normal limits and 2D echo with preserved ejection fraction and no wall motion abnormalities. -Outpatient follow-up with cardiology service recommended.  Acute kidney injury on CKD stage IV BUN/creatinine 36/2.54 on admission (baseline creatinine at 2.0) -Slight increase in creatinine appreciated after Lasix administration -Currently 2.20, continue to follow daily weights and strict intake and output.  -Renally adjust medications, avoid nephrotoxic agents/dehydration/hypotension -Follow renal function trend.   Hypothyroidism -Continue Synthroid.  Essential hypertension -Continue metoprolol and oral Cardizem. -Follow-up vital signs.   Prostate cx with metastasis -Currently on treatment with urology  -Continue outpatient follow-up for this.     DVT prophylaxis: Xarelto Code Status: DNR Family Communication: No family at bedside. Disposition Plan:  Status is: Inpatient  Remains inpatient appropriate because: IV medications   Consultants:  None   Procedures:  None   Antimicrobials:  Anti-infectives (From admission, onward)    Start     Dose/Rate Route Frequency Ordered Stop   06/13/21 1000  ceFEPIme (MAXIPIME) 2 g in sodium chloride 0.9 % 100 mL IVPB        2 g 200 mL/hr over 30 Minutes  Intravenous Every 12 hours 06/13/21 0902     06/10/21 0930  piperacillin-tazobactam (ZOSYN) IVPB 3.375 g  Status:  Discontinued        3.375 g 12.5 mL/hr over 240 Minutes Intravenous Every 8 hours 06/10/21 0838 06/13/21 0902   06/08/21 2000  cefTRIAXone (ROCEPHIN) 2 g in sodium chloride 0.9 % 100 mL IVPB  Status:  Discontinued        2 g 200 mL/hr over 30 Minutes Intravenous Every 24 hours 06/08/21 1946 06/10/21 0821   06/08/21 2000  azithromycin (ZITHROMAX) 500 mg in sodium chloride 0.9 % 250 mL IVPB  Status:  Discontinued        500 mg 250 mL/hr over 60 Minutes Intravenous Every 24 hours 06/08/21 1946 06/10/21 0821       Subjective: No overnight events; remains weak and deconditioned.  4 L nasal cannula supplementation in place.  Good urine output.  Rate controlled.  Reports no chest pain, no nausea, no vomiting.  Objective: Vitals:   06/15/21 0600 06/15/21 0742 06/15/21 1106 06/15/21 1437  BP: (!) 1'08/48 95/83 91/60 '$ 91/60  Pulse: (!) 101 96  90  Resp: (!) '22 20  20  '$ Temp: 97.8 F (36.6 C) 97.8 F (36.6 C)  97.7 F (36.5 C)  TempSrc: Oral Oral  Oral  SpO2: 95% 90%  97%  Weight:      Height:        Intake/Output Summary (Last 24 hours) at 06/15/2021 1835 Last data filed at 06/15/2021 1400 Gross per 24 hour  Intake 540 ml  Output 850 ml  Net -310 ml   Filed Weights   06/12/21 0425 06/13/21 0500 06/14/21 0606  Weight: 126.9 kg 128.6 kg 124.4 kg    Examination: General exam: Alert, awake, oriented x 3; appears calm and comfortable; weak and deconditioned.  Reports no chest pain, no nausea, no vomiting. Respiratory system: Clear to auscultation. Respiratory effort normal.  No using accessory muscles.  Good saturation on 4 L [place] supplementation. Cardiovascular system:Rate controlled.  No rubs, no gallops, no JVD on exam. Gastrointestinal system: Abdomen is obese, nondistended, soft and nontender. No organomegaly or masses felt. Normal bowel sounds heard. Central nervous  system: Alert and oriented. No focal neurological deficits. Extremities: No cyanosis or clubbing; trace edema appreciated bilaterally. Skin: No petechiae. Psychiatry: Judgement and insight appear normal. Mood & affect appropriate.   Data Reviewed: I have personally reviewed following labs and imaging studies  CBC: Recent Labs  Lab 06/08/21 1936 06/09/21 0313 06/11/21 0424 06/12/21 0401 06/13/21 0530 06/14/21 0325 06/15/21 0452  WBC 15.6*   < > 13.4* 12.8* 14.4* 14.7* 17.8*  NEUTROABS 11.9*  --   --   --   --   --   --   HGB 14.0   < > 11.9* 12.0* 11.4* 11.1* 11.2*  HCT 42.3   < > 36.4* 37.3* 34.9* 34.6* 35.8*  MCV 97.7   < > 100.6* 101.6* 99.7 101.8* 102.9*  PLT 268   < > 225 240 228 229 280   < > = values in this interval not displayed.   Basic Metabolic Panel: Recent Labs  Lab 06/09/21 0313 06/10/21 0420 06/11/21 0424 06/12/21 0401 06/13/21 0530 06/14/21 0325 06/15/21 0452  NA 136   < > 137 138 138 140 140  K 4.1   < > 4.2 4.0 3.9 4.1 3.9  CL 105   < > 104 103 106 105 106  CO2 24   < > '25 27 25 '$ 25  26  GLUCOSE 139*   < > 165* 153* 178* 167* 182*  BUN 34*   < > 38* 42* 43* 44* 47*  CREATININE 2.28*   < > 2.36* 2.53* 2.32* 2.20* 2.20*  CALCIUM 7.9*   < > 7.9* 7.9* 7.8* 7.6* 7.7*  MG 1.6*   < > 1.9 2.0 2.1 2.1 2.3  PHOS 4.0  --   --   --   --   --   --    < > = values in this interval not displayed.   GFR: Estimated Creatinine Clearance: 36.9 mL/min (A) (by C-G formula based on SCr of 2.2 mg/dL (H)).  Liver Function Tests: Recent Labs  Lab 06/08/21 1936 06/09/21 0313 06/11/21 0424  AST 20 30 37  ALT '19 18 30  '$ ALKPHOS 44 36* 50  BILITOT 1.3* 0.9 1.0  PROT 8.0 6.8 6.9  ALBUMIN 3.7 3.1* 2.8*   Coagulation Profile: Recent Labs  Lab 06/08/21 1936  INR 1.3*   CBG: Recent Labs  Lab 06/09/21 1015  GLUCAP 204*    Sepsis Labs: Recent Labs  Lab 06/08/21 1936 06/08/21 2127 06/09/21 0313 06/09/21 0555 06/10/21 0420 06/11/21 0424 06/12/21 0401   PROCALCITON  --   --  0.60  --  4.94 3.86 2.67  LATICACIDVEN 3.3* 2.1* 1.9 1.4  --   --   --     Recent Results (from the past 240 hour(s))  Blood Culture (routine x 2)     Status: None   Collection Time: 06/08/21  7:36 PM   Specimen: BLOOD  Result Value Ref Range Status   Specimen Description BLOOD RIGHT ANTECUBITAL  Final   Special Requests   Final    BOTTLES DRAWN AEROBIC AND ANAEROBIC Blood Culture adequate volume   Culture   Final    NO GROWTH 5 DAYS Performed at [facility], [address], [place], [place] [address]    Report Status 06/13/2021 FINAL  Final  Blood Culture (routine x 2)     Status: None   Collection Time: 06/08/21  7:48 PM   Specimen: BLOOD  Result Value Ref Range Status   Specimen Description BLOOD BLOOD LEFT HAND  Final   Special Requests   Final    BOTTLES DRAWN AEROBIC AND ANAEROBIC Blood Culture results may not be optimal due to an inadequate volume of blood received in culture bottles   Culture   Final    NO GROWTH 5 DAYS Performed at [facility], [address] [place], [place] [address]    Report Status 06/13/2021 FINAL  Final  Resp Panel by RT-PCR (Flu A&B, Covid) Anterior Nasal Swab     Status: None   Collection Time: 06/08/21  8:10 PM   Specimen: Anterior Nasal Swab  Result Value Ref Range Status   SARS Coronavirus 2 by RT PCR NEGATIVE NEGATIVE Final    Comment: (NOTE) SARS-CoV-2 target nucleic acids are NOT DETECTED.  The SARS-CoV-2 RNA is generally detectable in upper respiratory specimens during the acute phase of infection. The lowest concentration of SARS-CoV-2 viral copies this assay can detect is 138 copies/mL. A negative result does not preclude SARS-Cov-2 infection and should not be used as the sole basis for treatment or other patient management decisions. A negative result may occur with  improper specimen collection/handling, submission of specimen other than nasopharyngeal swab, presence of viral mutation(s) within  the areas targeted by this assay, and inadequate number of viral copies(<138 copies/mL). A negative result must be combined with  clinical observations, patient history, and epidemiological information. The expected result is Negative.  Fact Sheet for Patients:  EntrepreneurPulse.com.au  Fact Sheet for Healthcare Providers:  IncredibleEmployment.be  This test is no t yet approved or cleared by the [place] FDA and  has been authorized for detection and/or diagnosis of SARS-CoV-2 by FDA under an Emergency Use Authorization (EUA). This EUA will remain  in effect (meaning this test can be used) for the duration of the COVID-19 declaration under Section 564(b)(1) of the Act, 21 U.S.C.section 360bbb-3(b)(1), unless the authorization is terminated  or revoked sooner.       Influenza A by PCR NEGATIVE NEGATIVE Final   Influenza B by PCR NEGATIVE NEGATIVE Final    Comment: (NOTE) The Xpert Xpress SARS-CoV-2/FLU/RSV plus assay is intended as an aid in the diagnosis of influenza from Nasopharyngeal swab specimens and should not be used as a sole basis for treatment. Nasal washings and aspirates are unacceptable for Xpert Xpress SARS-CoV-2/FLU/RSV testing.  Fact Sheet for Patients: EntrepreneurPulse.com.au  Fact Sheet for Healthcare Providers: IncredibleEmployment.be  This test is not yet approved or cleared by the [place] FDA and has been authorized for detection and/or diagnosis of SARS-CoV-2 by FDA under an Emergency Use Authorization (EUA). This EUA will remain in effect (meaning this test can be used) for the duration of the COVID-19 declaration under Section 564(b)(1) of the Act, 21 U.S.C. section 360bbb-3(b)(1), unless the authorization is terminated or revoked.  Performed at [facility], [address] [place], [place] [address]   MRSA Next Gen by PCR, Nasal     Status: None   Collection  Time: 06/09/21  9:14 AM   Specimen: Nasal Mucosa; Nasal Swab  Result Value Ref Range Status   MRSA by PCR Next Gen NOT DETECTED NOT DETECTED Final    Comment: (NOTE) The GeneXpert MRSA Assay (FDA approved for NASAL specimens only), is one component of a comprehensive MRSA colonization surveillance program. It is not intended to diagnose MRSA infection nor to guide or monitor treatment for MRSA infections. Test performance is not FDA approved in patients less than 64 years old. Performed at [facility], [address] [place], [place] [address]   Urine Culture     Status: Abnormal   Collection Time: 06/09/21 12:33 PM   Specimen: Urine, Clean Catch  Result Value Ref Range Status   Specimen Description   Final    URINE, CLEAN CATCH Performed at [facility], [address] [place], [place] [address]    Special Requests   Final    NONE Performed at [facility], [address], [place], [place] [address]    Culture 40,000 COLONIES/mL ENTEROBACTER CLOACAE (A)  Final   Report Status 06/13/2021 FINAL  Final   Organism ID, Bacteria ENTEROBACTER CLOACAE (A)  Final      Susceptibility   Enterobacter cloacae - MIC*    CEFAZOLIN >=64 RESISTANT Resistant     CEFEPIME 2 SENSITIVE Sensitive     CIPROFLOXACIN 1 RESISTANT Resistant     GENTAMICIN <=1 SENSITIVE Sensitive     IMIPENEM 0.5 SENSITIVE Sensitive     NITROFURANTOIN 64 INTERMEDIATE Intermediate     TRIMETH/SULFA <=20 SENSITIVE Sensitive     PIP/TAZO >=128 RESISTANT Resistant     * 40,000 COLONIES/mL ENTEROBACTER CLOACAE  Culture, blood (Routine X 2) w Reflex to ID Panel     Status: None   Collection Time: 06/10/21  9:38 AM   Specimen: BLOOD LEFT HAND  Result Value Ref Range  Status   Specimen Description   Final    BLOOD LEFT HAND BOTTLES DRAWN AEROBIC AND ANAEROBIC   Special Requests Blood Culture adequate volume  Final   Culture   Final    NO GROWTH 5 DAYS Performed at [facility], [address] [place], [place] [address]     Report Status 06/15/2021 FINAL  Final  Culture, blood (Routine X 2) w Reflex to ID Panel     Status: None   Collection Time: 06/10/21  2:48 PM   Specimen: BLOOD RIGHT HAND  Result Value Ref Range Status   Specimen Description BLOOD RIGHT HAND  Final   Special Requests   Final    BOTTLES DRAWN AEROBIC AND ANAEROBIC Blood Culture adequate volume   Culture   Final    NO GROWTH 5 DAYS Performed at [facility], [address] [place], [place] [address]    Report Status 06/15/2021 FINAL  Final     Radiology Studies: DG CHEST PORT 1 VIEW  Result Date: 06/14/2021 CLINICAL DATA:  Shortness of breath EXAM: PORTABLE CHEST 1 VIEW COMPARISON:  06/10/2021 FINDINGS: Stable cardiomediastinal contours. Aortic atherosclerosis. Chronic elevation of the right hemidiaphragm with right basilar atelectasis. Slightly increasing interstitial markings bilaterally. No pleural effusion or pneumothorax. IMPRESSION: Slightly increasing interstitial markings bilaterally, could reflect edema or atypical/viral infection. Electronically Signed   By: Davina Poke D.O.   On: 06/14/2021 11:02    Scheduled Meds:  Chlorhexidine Gluconate Cloth  6 each Topical Daily   dextromethorphan-guaiFENesin  1 tablet Oral BID   diltiazem  90 mg Oral Q6H   levothyroxine  100 mcg Oral Q0600   metoprolol tartrate  100 mg Oral BID   Rivaroxaban  15 mg Oral Q supper   traZODone  50 mg Oral QHS   Continuous Infusions:  ceFEPime (MAXIPIME) IV 2 g (06/15/21 1105)   diltiazem (CARDIZEM) infusion Stopped (06/11/21 2130)     LOS: 7 days    Barton Dubois, MD [facility]  If 7PM-7AM, please contact night-coverage [email or web address] 06/15/2021, 6:35 PM

## 2021-06-15 NOTE — Progress Notes (Signed)
Physical Therapy Treatment Patient Details Name: [name] [name] MRN: [id number] DOB: 07/08/1940 Today's Date: 06/15/2021   History of Present Illness [name] [name] is a 81 y.o. male with medical history significant of atrial fibrillation, CKD stage IIIb, prostate cancer with bone metastasis, hypothyroidism, hypertension who presents to the emergency department due to sudden onset of weakness and malaise this afternoon.  Patient states that he was in his normal state of health this morning and was without any complaints, but in the afternoon, around 3-4 PM, he suddenly felt weak and febrile and developed a nonproductive cough, so he went to bed.  Son checked on him and saw that he felt sick, daughter-in-law (who was a Marine scientist) checked his temperature and it was 103.75F, so EMS was activated and patient was sent to the ED for further evaluation and management.  He denies chest pain, headache, vomiting, nausea, abdominal pain, diarrhea or constipation.    PT Comments    Patient presents sitting in chair on 3 LPM and consents to physical therapy treatment. Patient is min guard/assist with transfers requiring use of RW. Patient demonstrates labored movement with coordination and balance deficits observed. Patient able to tolerate standing balance. Patient was able to ambulate for 40 feet in hallway with min guard/assist and RW. Patient ambulated 20 feet on room air before desat at 86% in which patient put on 2 LPM. Patient ambulated last 20 feet with vitals within appropriate levels but limited by fatigue. Patient tolerated therapeutic exercises on room air with SpO2 at 90%. Patient left in chair at 2 LPM and SpO2 at 96% and nursing aware. Patient will benefit from continued skilled physical therapy in hospital and recommended venue below to increase strength, balance, endurance for safe ADLs and gait.    Recommendations for follow up therapy are one component of a multi-disciplinary discharge planning  process, led by the attending physician.  Recommendations may be updated based on patient status, additional functional criteria and insurance authorization.  Follow Up Recommendations  Skilled nursing-short term rehab (<3 hours/day)     Assistance Recommended at Discharge Set up Supervision/Assistance  Patient can return home with the following A lot of help with walking and/or transfers;A little help with bathing/dressing/bathroom;Assistance with cooking/housework;Help with stairs or ramp for entrance   Equipment Recommendations  None recommended by PT    Recommendations for Other Services       Precautions / Restrictions Precautions Precautions: Fall Restrictions Weight Bearing Restrictions: No     Mobility  Bed Mobility                    Transfers Overall transfer level: Needs assistance Equipment used: Rolling walker (2 wheels) Transfers: Sit to/from Stand, Bed to chair/wheelchair/BSC Sit to Stand: Min guard, Min assist   Step pivot transfers: Min guard, Min assist       General transfer comment: Patient demonstrates slow labored movement with transfers and use of RW with min guard/assist provided due to coordination and balance deficits.    Ambulation/Gait Ambulation/Gait assistance: Min guard, Min assist Gait Distance (Feet): 40 Feet Assistive device: Rolling walker (2 wheels) Gait Pattern/deviations: Decreased step length - right, Decreased step length - left, Decreased stride length, Trunk flexed Gait velocity: decreased     General Gait Details: Patient able to ambulate 40 feet in hallway with min guard/assist and use of RW. Patient ambualted halfway on room air before desatting at 86% SpO2 and 2 LPM applied. Minor balance deficits and gait deviaitons. Patient limited  by fatigue.   Stairs             Wheelchair Mobility    Modified Rankin (Stroke Patients Only)       Balance Overall balance assessment: Needs  assistance Sitting-balance support: Feet supported, No upper extremity supported Sitting balance-Leahy Scale: Fair Sitting balance - Comments: fair/good seated at EOB   Standing balance support: During functional activity, No upper extremity supported Standing balance-Leahy Scale: Poor Standing balance comment: Patient reliant on RW but able to maintain standing position and not losing balance reaching outside BOS.                            Cognition Arousal/Alertness: Awake/alert Behavior During Therapy: WFL for tasks assessed/performed Overall Cognitive Status: Within Functional Limits for tasks assessed                                          Exercises General Exercises - Lower Extremity Long Arc Quad: AROM, Strengthening, Both, 10 reps, Seated Hip Flexion/Marching: AROM, 10 reps, Both, Strengthening, Seated Toe Raises: AROM, Strengthening, Both, Seated, 20 reps Heel Raises: AROM, 20 reps, Both, Seated, Strengthening    General Comments        Pertinent Vitals/Pain Pain Assessment Pain Assessment: No/denies pain    Home Living                          Prior Function            PT Goals (current goals can now be found in the care plan section) Acute Rehab PT Goals Patient Stated Goal: return home with family to assist PT Goal Formulation: With patient Time For Goal Achievement: 06/28/21 Potential to Achieve Goals: Good Progress towards PT goals: Progressing toward goals    Frequency    Min 3X/week      PT Plan Current plan remains appropriate    Co-evaluation              AM-PAC PT "6 Clicks" Mobility   Outcome Measure  Help needed turning from your back to your side while in a flat bed without using bedrails?: None Help needed moving from lying on your back to sitting on the side of a flat bed without using bedrails?: A Little Help needed moving to and from a bed to a chair (including a wheelchair)?: A  Little Help needed standing up from a chair using your arms (e.g., wheelchair or bedside chair)?: A Lot Help needed to walk in hospital room?: A Lot Help needed climbing 3-5 steps with a railing? : A Lot 6 Click Score: 16    End of Session   Activity Tolerance: Patient limited by fatigue Patient left: in chair;with call bell/phone within reach Nurse Communication: Mobility status PT Visit Diagnosis: Unsteadiness on feet (R26.81);Other abnormalities of gait and mobility (R26.89);Muscle weakness (generalized) (M62.81)     Time: 5361-4431 PT Time Calculation (min) (ACUTE ONLY): 22 min  Charges:  $Gait Training: 8-22 mins $Therapeutic Exercise: 8-22 mins                     2:36 PM, 06/15/21 Lestine Box, S/PT

## 2021-06-15 NOTE — TOC Progression Note (Addendum)
Transition of Care [facility]) - Progression Note    Patient Details  Name: DAYMOND CORDTS MRN: [id number] Date of Birth: 12-Feb-1940  Transition of Care [facility]) CM/SW [place], [place] Phone Number: 06/15/2021, 10:19 AM  Clinical Narrative:    CSW spoke with pts son about bed offer from [facility]. CSW spoke to [place] with [facility] who states they do not currently have any beds. CSW updated pts son of this also. Pts son states he needs to speak with his brother to assist in making the final decision. Pts son will give CSW final decision this evening. TOC to follow.   Addendum 2:50pm: CSW received VM from pts son that him and his brother are agreeable and accepting of the bed offer from [facility]. Pts son requesting that CSW speak with pt and his wife that are in room. CSW spoke with pt and DIL Abigail Butts who are both agreeable and accepting of bed offer also. CSW updated Ebony Hail with the facility of this. CSW updated pts insurance of bed choice, Josem Kaufmann has been approved auth ID is [id number]. TOC to follow.       Expected Discharge Plan and Services                                                 Social Determinants of Health (SDOH) Interventions    Readmission Risk Interventions     View : No data to display.

## 2021-06-16 ENCOUNTER — Inpatient Hospital Stay (HOSPITAL_COMMUNITY): Payer: PPO

## 2021-06-16 DIAGNOSIS — A419 Sepsis, unspecified organism: Secondary | ICD-10-CM | POA: Diagnosis not present

## 2021-06-16 DIAGNOSIS — I4891 Unspecified atrial fibrillation: Secondary | ICD-10-CM | POA: Diagnosis not present

## 2021-06-16 DIAGNOSIS — R6510 Systemic inflammatory response syndrome (SIRS) of non-infectious origin without acute organ dysfunction: Secondary | ICD-10-CM | POA: Diagnosis not present

## 2021-06-16 DIAGNOSIS — N179 Acute kidney failure, unspecified: Secondary | ICD-10-CM | POA: Diagnosis not present

## 2021-06-16 MED ORDER — DILTIAZEM HCL ER COATED BEADS 180 MG PO CP24
360.0000 mg | ORAL_CAPSULE | Freq: Every day | ORAL | Status: DC
  Administered 2021-06-16 – 2021-06-18 (×3): 360 mg via ORAL
  Filled 2021-06-16 (×3): qty 2

## 2021-06-16 MED ORDER — FUROSEMIDE 10 MG/ML IJ SOLN
20.0000 mg | Freq: Once | INTRAMUSCULAR | Status: AC
  Administered 2021-06-16: 20 mg via INTRAVENOUS
  Filled 2021-06-16: qty 2

## 2021-06-16 MED ORDER — DIPHENHYDRAMINE-ZINC ACETATE 2-0.1 % EX CREA: TOPICAL_CREAM | Freq: Three times a day (TID) | CUTANEOUS | Status: DC | PRN

## 2021-06-16 MED ORDER — CAMPHOR-MENTHOL 0.5-0.5 % EX LOTN
TOPICAL_LOTION | CUTANEOUS | Status: DC | PRN
Start: 2021-06-16 — End: 2021-06-18

## 2021-06-16 NOTE — Progress Notes (Signed)
Physical Therapy Treatment Patient Details Name: [name] [name] MRN: [id number] DOB: 09/27/1940 Today's Date: 06/16/2021   History of Present Illness [name] [name] is a 81 y.o. male with medical history significant of atrial fibrillation, CKD stage IIIb, prostate cancer with bone metastasis, hypothyroidism, hypertension who presents to the emergency department due to sudden onset of weakness and malaise this afternoon.  Patient states that he was in his normal state of health this morning and was without any complaints, but in the afternoon, around 3-4 PM, he suddenly felt weak and febrile and developed a nonproductive cough, so he went to bed.  Son checked on him and saw that he felt sick, daughter-in-law (who was a [employer] scientist) checked his temperature and it was 103.10F, so EMS was activated and patient was sent to the ED for further evaluation and management.  He denies chest pain, headache, vomiting, nausea, abdominal pain, diarrhea or constipation.    PT Comments    Patient sitting in chair upon therapist arrival. Patient agreeable to participating in therapy session today. Patient required min guard to min assist for transfers and ambulation with RW today. Cuing given for sequencing of steps and placement of hands with use of RW. Patient limited by dyspnea on exertion, shortness of breathe and fatigue. Patient on 4 L?M O2 throughout session.  Patient would continue to benefit from skilled physical therapy in current environment and next venue to continue return to prior function and increase strength, endurance, balance, coordination, and functional mobility and gait skills.     Recommendations for follow up therapy are one component of a multi-disciplinary discharge planning process, led by the attending physician.  Recommendations may be updated based on patient status, additional functional criteria and insurance authorization.  Follow Up Recommendations  Skilled nursing-short term rehab (<3  hours/day)     Assistance Recommended at Discharge Set up Supervision/Assistance  Patient can return home with the following A lot of help with walking and/or transfers;A little help with bathing/dressing/bathroom;Assistance with cooking/housework;Help with stairs or ramp for entrance;Assist for transportation   Equipment Recommendations  None recommended by PT    Recommendations for Other Services       Precautions / Restrictions Precautions Precautions: Fall Restrictions Weight Bearing Restrictions: No     Mobility  Bed Mobility               General bed mobility comments: Patient sitting in chair at beginning and end of session    Transfers Overall transfer level: Needs assistance Equipment used: Rolling walker (2 wheels) Transfers: Sit to/from Stand Sit to Stand: Min guard, Min assist   Step pivot transfers: Min guard, Min assist       General transfer comment: Patient demonstrates slow labored movement with transfers and use of RW; cues for hand placement and step sequencing with use of RW.    Ambulation/Gait Ambulation/Gait assistance: Min guard, Min assist Gait Distance (Feet): 44 Feet Assistive device: Rolling walker (2 wheels) Gait Pattern/deviations: Decreased step length - right, Decreased step length - left, Decreased stride length, Trunk flexed Gait velocity: decreased     General Gait Details: Slow labored cadence with use of RW; On 4 L/M O2 throughout session; limited by fatigue, shortnesss of breath, dyspnea on exertion; cues for proper hand placement and sequencing of step with use of RW   Stairs             Wheelchair Mobility    Modified Rankin (Stroke Patients Only)  Balance Overall balance assessment: Needs assistance Sitting-balance support: Feet supported, No upper extremity supported Sitting balance-Leahy Scale: Fair Sitting balance - Comments: fair/good seated at EOB   Standing balance support: During functional  activity, Reliant on assistive device for balance, Bilateral upper extremity supported Standing balance-Leahy Scale: Fair                              Cognition Arousal/Alertness: Awake/alert Behavior During Therapy: WFL for tasks assessed/performed Overall Cognitive Status: Within Functional Limits for tasks assessed                                          Exercises General Exercises - Upper Extremity Shoulder Flexion: AROM, Strengthening, Both, 10 reps, Seated General Exercises - Lower Extremity Long Arc Quad: AROM, Strengthening, Both, 10 reps, Seated Hip ABduction/ADduction: Strengthening, Both, 10 reps, Seated (isometric) Hip Flexion/Marching: AROM, 10 reps, Both, Strengthening, Seated Toe Raises: AROM, Strengthening, Both, Seated, 10 reps Heel Raises: AROM, Both, Seated, Strengthening, 10 reps    General Comments        Pertinent Vitals/Pain Pain Assessment Pain Assessment: No/denies pain    Home Living                          Prior Function            PT Goals (current goals can now be found in the care plan section) Acute Rehab PT Goals Patient Stated Goal: return home with family to assist PT Goal Formulation: With patient Time For Goal Achievement: 06/28/21 Potential to Achieve Goals: Good Progress towards PT goals: Progressing toward goals    Frequency    Min 3X/week      PT Plan Current plan remains appropriate       AM-PAC PT "6 Clicks" Mobility   Outcome Measure  Help needed turning from your back to your side while in a flat bed without using bedrails?: None Help needed moving from lying on your back to sitting on the side of a flat bed without using bedrails?: A Little Help needed moving to and from a bed to a chair (including a wheelchair)?: A Little Help needed standing up from a chair using your arms (e.g., wheelchair or bedside chair)?: A Lot Help needed to walk in hospital room?: A Lot Help  needed climbing 3-5 steps with a railing? : A Lot 6 Click Score: 16    End of Session   Activity Tolerance: Patient limited by fatigue Patient left: in chair;with call bell/phone within reach Nurse Communication: Mobility status PT Visit Diagnosis: Unsteadiness on feet (R26.81);Other abnormalities of gait and mobility (R26.89);Muscle weakness (generalized) (M62.81)     Time: 6967-8938 PT Time Calculation (min) (ACUTE ONLY): 23 min  Charges:  $Therapeutic Exercise: 8-22 mins $Therapeutic Activity: 8-22 mins                    Floria Raveling. Hartnett-Rands, MS, PT Per [place] [phone]  Pamala Hurry  Hartnett-Rands 06/16/2021, 12:00 PM

## 2021-06-16 NOTE — TOC Progression Note (Signed)
Transition of Care [facility]) - Progression Note    Patient Details  Name: [name] [name] MRN: [id number] Date of Birth: 12/30/1940  Transition of Care [facility]) CM/SW Contact  Salome Arnt, [place] Phone Number: 06/16/2021, 10:48 AM  Clinical Narrative:  LCSW updated [place] on pt. Anticipate d/c tomorrow per MD. Josem Kaufmann is good through Tuesday. TOC will continue to follow.       Barriers to Discharge: Continued Medical Work up  Expected Discharge Plan and Services                                                 Social Determinants of Health (SDOH) Interventions    Readmission Risk Interventions     No data to display

## 2021-06-16 NOTE — Progress Notes (Signed)
PROGRESS NOTE    [name] [name]  NGE:[id number] DOB: [birth year] DOA: 06/08/2021 PCP: [name], MD   Brief Narrative:    [name] [name] is a 81 y.o. male with medical history significant of atrial fibrillation, CKD stage IIIb, prostate cancer with bone metastasis, hypothyroidism, hypertension who presents to the emergency department due to sudden onset of weakness and malaise this afternoon.  He was admitted with concerns for sepsis and appears to be having severe sepsis secondary to UTI and possible Community-acquired pneumonia and had been started on Rocephin and azithromycin.  He is now noted to have Enterobacter UTI and has been changed to cefepime per sensitivities on urine culture.  He is also noted to have atrial fibrillation with RVR and has been weaned off of his Cardizem drip.  Oral Cardizem dose is being adjusted to help with heart rate management and he required some intermittent diuresis for interstitial lung edema.  PT recommending SNF placement and family deciding on SNF versus home health.  Assessment & Plan:   Principal Problem:   SIRS (systemic inflammatory response syndrome) (HCC) Active Problems:   Essential hypertension   Acute kidney injury superimposed on chronic kidney disease (HCC)   Hypothyroidism   Leukocytosis   Paroxysmal atrial fibrillation with RVR (HCC)   Lactic acidosis  Assessment and Plan:   Severe sepsis secondary to Enterobacter UTI with possible CAP, present on admission -Blood cultures with no growth thus far and urine cultures ordered after initial antibiotics -Patient was initially on Rocephin and azithromycin which was changed to Zosyn 6/2, now on cefepime per sensitivities on urine culture  -Repeat blood culture 6/2 with no growth to date -Continue Tylenol as needed -Continue Mucinex, incentive spirometry, flutter valve  -Follow clinical response. -Continue to wean off oxygen supplementation as tolerated.   Acute hypoxemic  respiratory failure secondary to cardiogenic pulmonary edema -Status post 2 doses of IV Lasix (6/2 and 6/6); will give another dose today  -Continue to hold IV diuretics-hold oral hydration. -Continue to wean off oxygen supplementation as tolerated -Currently on 3-4 L; work on weaning him off O2 as tolerated. -2D echocardiogram 6/4 with LVEF 60-65% and no other acute findings   Leukocytosis possible secondary to above -Currently downtrending, continue to follow -Zosyn discontinued and changed to cefepime 6/5, day 3 on cefepime. -Follow clinical response.  Lactic acidosis possibly secondary to multifactorial -Resolved -Maintain adequate hydration.  Paroxysmal A-fib with RVR-improved -Continue Lopressor and oral Cardizem -Continue Xarelto for secondary prevention -Continue telemetry monitoring. -TSH within normal limits and 2D echo with preserved ejection fraction and no wall motion abnormalities. -Outpatient follow-up with cardiology service recommended.  Acute kidney injury on CKD stage IV BUN/creatinine 36/2.54 on admission (baseline creatinine at 2.0) -Currently has remained in the 2.20 range, continue to follow daily weights and strict intake and output.  -Renally adjust medications, avoid nephrotoxic agents/dehydration/hypotension -Follow renal function trend. -low dose IV lasix to be given today   Hypothyroidism -Continue Synthroid. -TSH within normal limits.  Essential hypertension -Continue metoprolol and oral Cardizem; last 1 will be consolidated on extended release form. -Follow-up vital signs and response..   Prostate cancer with metastasis -Currently on treatment with urology  -Continue outpatient follow-up for this.  Physical deconditioning -Patient has been seen by PT with recommendation for a skilled nursing facility at discharge for further care and rehab. -Patient and family in agreement. -Hopefully able to go to a SNF on 06/17/2021.     DVT prophylaxis:  Xarelto Code Status: DNR Family  Communication: No family at bedside. Disposition Plan:  Status is: Inpatient  Remains inpatient appropriate because: IV medications   Consultants:  None   Procedures:  None   Antimicrobials:  Anti-infectives (From admission, onward)    Start     Dose/Rate Route Frequency Ordered Stop   06/13/21 1000  ceFEPIme (MAXIPIME) 2 g in sodium chloride 0.9 % 100 mL IVPB        2 g 200 mL/hr over 30 Minutes Intravenous Every 12 hours 06/13/21 0902     06/10/21 0930  piperacillin-tazobactam (ZOSYN) IVPB 3.375 g  Status:  Discontinued        3.375 g 12.5 mL/hr over 240 Minutes Intravenous Every 8 hours 06/10/21 0838 06/13/21 0902   06/08/21 2000  cefTRIAXone (ROCEPHIN) 2 g in sodium chloride 0.9 % 100 mL IVPB  Status:  Discontinued        2 g 200 mL/hr over 30 Minutes Intravenous Every 24 hours 06/08/21 1946 06/10/21 0821   06/08/21 2000  azithromycin (ZITHROMAX) 500 mg in sodium chloride 0.9 % 250 mL IVPB  Status:  Discontinued        500 mg 250 mL/hr over 60 Minutes Intravenous Every 24 hours 06/08/21 1946 06/10/21 0821       Subjective: No overnight events; remains weak and deconditioned; 3-4 L in place.  Expressing feeling short winded.  No chest pain, no nausea, no vomiting.  Objective: Vitals:   06/16/21 0437 06/16/21 0441 06/16/21 0503 06/16/21 1442  BP: 111/64   115/62  Pulse: 83 95  80  Resp: 20   19  Temp: 97.8 F (36.6 C)   98.6 F (37 C)  TempSrc:    Oral  SpO2: (!) 89% 90% (!) 88% 94%  Weight:      Height:        Intake/Output Summary (Last 24 hours) at 06/16/2021 1744 Last data filed at 06/16/2021 1300 Gross per 24 hour  Intake --  Output 500 ml  Net -500 ml   Filed Weights   06/12/21 0425 06/13/21 0500 06/14/21 0606  Weight: 126.9 kg 128.6 kg 124.4 kg    Examination: General exam: Alert, awake, oriented x 3; reports feeling slightly better today; still weak, deconditioned and short winded.  3-4 L nasal cannula  supplementation in place. Respiratory system: Decreased breath sounds at the bases, no using accessory muscles.  Normal respiratory effort.  Positive scattered rhonchi. Cardiovascular system: Rate controlled, no rubs, no gallops, no JVD on exam. Gastrointestinal system: Abdomen is nondistended, soft and nontender. No organomegaly or masses felt. Normal bowel sounds heard. Central nervous system: Alert and oriented. No focal neurological deficits. Extremities: No cyanosis or clubbing; trace edema appreciated bilaterally. Skin: No petechiae. Psychiatry: Judgement and insight appear normal. Mood & affect appropriate.   Data Reviewed: I have personally reviewed following labs and imaging studies  CBC: Recent Labs  Lab 06/11/21 0424 06/12/21 0401 06/13/21 0530 06/14/21 0325 06/15/21 0452  WBC 13.4* 12.8* 14.4* 14.7* 17.8*  HGB 11.9* 12.0* 11.4* 11.1* 11.2*  HCT 36.4* 37.3* 34.9* 34.6* 35.8*  MCV 100.6* 101.6* 99.7 101.8* 102.9*  PLT 225 240 228 229 315   Basic Metabolic Panel: Recent Labs  Lab 06/11/21 0424 06/12/21 0401 06/13/21 0530 06/14/21 0325 06/15/21 0452  NA 137 138 138 140 140  K 4.2 4.0 3.9 4.1 3.9  CL 104 103 106 105 106  CO2 '25 27 25 25 26  '$ GLUCOSE 165* 153* 178* 167* 182*  BUN 38* 42* 43* 44* 47*  CREATININE 2.36* 2.53* 2.32* 2.20* 2.20*  CALCIUM 7.9* 7.9* 7.8* 7.6* 7.7*  MG 1.9 2.0 2.1 2.1 2.3   GFR: Estimated Creatinine Clearance: 36.9 mL/min (A) (by C-G formula based on SCr of 2.2 mg/dL (H)).  Liver Function Tests: Recent Labs  Lab 06/11/21 0424  AST 37  ALT 30  ALKPHOS 50  BILITOT 1.0  PROT 6.9  ALBUMIN 2.8*   CBG: No results for input(s): "GLUCAP" in the last 168 hours.   Sepsis Labs: Recent Labs  Lab 06/10/21 0420 06/11/21 0424 06/12/21 0401  PROCALCITON 4.94 3.86 2.67    Recent Results (from the past 240 hour(s))  Blood Culture (routine x 2)     Status: None   Collection Time: 06/08/21  7:36 PM   Specimen: BLOOD  Result Value  Ref Range Status   Specimen Description BLOOD RIGHT ANTECUBITAL  Final   Special Requests   Final    BOTTLES DRAWN AEROBIC AND ANAEROBIC Blood Culture adequate volume   Culture   Final    NO GROWTH 5 DAYS Performed at [facility], [address], [place], [place] [address]    Report Status 06/13/2021 FINAL  Final  Blood Culture (routine x 2)     Status: None   Collection Time: 06/08/21  7:48 PM   Specimen: BLOOD  Result Value Ref Range Status   Specimen Description BLOOD BLOOD LEFT HAND  Final   Special Requests   Final    BOTTLES DRAWN AEROBIC AND ANAEROBIC Blood Culture results may not be optimal due to an inadequate volume of blood received in culture bottles   Culture   Final    NO GROWTH 5 DAYS Performed at [facility], [address] [place], [place] [address]    Report Status 06/13/2021 FINAL  Final  Resp Panel by RT-PCR (Flu A&B, Covid) Anterior Nasal Swab     Status: None   Collection Time: 06/08/21  8:10 PM   Specimen: Anterior Nasal Swab  Result Value Ref Range Status   SARS Coronavirus 2 by RT PCR NEGATIVE NEGATIVE Final    Comment: (NOTE) SARS-CoV-2 target nucleic acids are NOT DETECTED.  The SARS-CoV-2 RNA is generally detectable in upper respiratory specimens during the acute phase of infection. The lowest concentration of SARS-CoV-2 viral copies this assay can detect is 138 copies/mL. A negative result does not preclude SARS-Cov-2 infection and should not be used as the sole basis for treatment or other patient management decisions. A negative result may occur with  improper specimen collection/handling, submission of specimen other than nasopharyngeal swab, presence of viral mutation(s) within the areas targeted by this assay, and inadequate number of viral copies(<138 copies/mL). A negative result must be combined with clinical observations, patient history, and epidemiological information. The expected result is Negative.  Fact Sheet for Patients:   EntrepreneurPulse.com.au  Fact Sheet for Healthcare Providers:  IncredibleEmployment.be  This test is no t yet approved or cleared by the [place] FDA and  has been authorized for detection and/or diagnosis of SARS-CoV-2 by FDA under an Emergency Use Authorization (EUA). This EUA will remain  in effect (meaning this test can be used) for the duration of the COVID-19 declaration under Section 564(b)(1) of the Act, 21 U.S.C.section 360bbb-3(b)(1), unless the authorization is terminated  or revoked sooner.       Influenza A by PCR NEGATIVE NEGATIVE Final   Influenza B by PCR NEGATIVE NEGATIVE Final    Comment: (NOTE) The Xpert Xpress SARS-CoV-2/FLU/RSV plus assay is intended as  an aid in the diagnosis of influenza from Nasopharyngeal swab specimens and should not be used as a sole basis for treatment. Nasal washings and aspirates are unacceptable for Xpert Xpress SARS-CoV-2/FLU/RSV testing.  Fact Sheet for Patients: EntrepreneurPulse.com.au  Fact Sheet for Healthcare Providers: IncredibleEmployment.be  This test is not yet approved or cleared by the [place] FDA and has been authorized for detection and/or diagnosis of SARS-CoV-2 by FDA under an Emergency Use Authorization (EUA). This EUA will remain in effect (meaning this test can be used) for the duration of the COVID-19 declaration under Section 564(b)(1) of the Act, 21 U.S.C. section 360bbb-3(b)(1), unless the authorization is terminated or revoked.  Performed at [facility], [address] [place], [place] [address]   MRSA Next Gen by PCR, Nasal     Status: None   Collection Time: 06/09/21  9:14 AM   Specimen: Nasal Mucosa; Nasal Swab  Result Value Ref Range Status   MRSA by PCR Next Gen NOT DETECTED NOT DETECTED Final    Comment: (NOTE) The GeneXpert MRSA Assay (FDA approved for NASAL specimens only), is one component of a  comprehensive MRSA colonization surveillance program. It is not intended to diagnose MRSA infection nor to guide or monitor treatment for MRSA infections. Test performance is not FDA approved in patients less than 11 years old. Performed at [facility], [address], [place], [place] [address]   Urine Culture     Status: Abnormal   Collection Time: 06/09/21 12:33 PM   Specimen: Urine, Clean Catch  Result Value Ref Range Status   Specimen Description   Final    URINE, CLEAN CATCH Performed at [facility], [address] [place], [place] [address]    Special Requests   Final    NONE Performed at [facility], [address] [place], [place] [address]    Culture 40,000 COLONIES/mL ENTEROBACTER CLOACAE (A)  Final   Report Status 06/13/2021 FINAL  Final   Organism ID, Bacteria ENTEROBACTER CLOACAE (A)  Final      Susceptibility   Enterobacter cloacae - MIC*    CEFAZOLIN >=64 RESISTANT Resistant     CEFEPIME 2 SENSITIVE Sensitive     CIPROFLOXACIN 1 RESISTANT Resistant     GENTAMICIN <=1 SENSITIVE Sensitive     IMIPENEM 0.5 SENSITIVE Sensitive     NITROFURANTOIN 64 INTERMEDIATE Intermediate     TRIMETH/SULFA <=20 SENSITIVE Sensitive     PIP/TAZO >=128 RESISTANT Resistant     * 40,000 COLONIES/mL ENTEROBACTER CLOACAE  Culture, blood (Routine X 2) w Reflex to ID Panel     Status: None   Collection Time: 06/10/21  9:38 AM   Specimen: BLOOD LEFT HAND  Result Value Ref Range Status   Specimen Description   Final    BLOOD LEFT HAND BOTTLES DRAWN AEROBIC AND ANAEROBIC   Special Requests Blood Culture adequate volume  Final   Culture   Final    NO GROWTH 5 DAYS Performed at [facility], [address], [place], [place] [address]    Report Status 06/15/2021 FINAL  Final  Culture, blood (Routine X 2) w Reflex to ID Panel     Status: None   Collection Time: 06/10/21  2:48 PM   Specimen: BLOOD RIGHT HAND  Result Value Ref Range Status   Specimen Description BLOOD RIGHT HAND  Final    Special Requests   Final    BOTTLES DRAWN AEROBIC AND ANAEROBIC Blood Culture adequate volume   Culture   Final  NO GROWTH 5 DAYS Performed at [facility], [address] [place], [place] [address]    Report Status 06/15/2021 FINAL  Final     Radiology Studies: [facility] Chest Port 1 View  Result Date: 06/16/2021 CLINICAL DATA:  Increased work of breathing.  Fevers. EXAM: PORTABLE CHEST 1 VIEW COMPARISON:  06/14/2021 FINDINGS: Stable cardiomediastinal contours. Aortic atherosclerosis. Chronic asymmetric elevation of the right hemidiaphragm noted. Atelectasis noted in the right base. Mild increase interstitial markings appear similar to previous exam. No pleural effusion or pneumothorax. IMPRESSION: 1. Stable chronic asymmetric elevation of the right hemidiaphragm with right base atelectasis. 2. Stable increased interstitial markings which may reflect pulmonary edema or atypical infection. Electronically Signed   By: Kerby Moors M.D.   On: 06/16/2021 06:17    Scheduled Meds:  Chlorhexidine Gluconate Cloth  6 each Topical Daily   dextromethorphan-guaiFENesin  1 tablet Oral BID   diltiazem  360 mg Oral Daily   furosemide  20 mg Intravenous Once   levothyroxine  100 mcg Oral Q0600   metoprolol tartrate  100 mg Oral BID   Rivaroxaban  15 mg Oral Q supper   traZODone  50 mg Oral QHS   Continuous Infusions:  ceFEPime (MAXIPIME) IV 2 g (06/16/21 0915)     LOS: 8 days    Barton Dubois, MD [facility]  If 7PM-7AM, please contact night-coverage [email or web address] 06/16/2021, 5:44 PM

## 2021-06-16 NOTE — Progress Notes (Signed)
MD notified of chest x-ray being resulted.  No new orders given at this time.

## 2021-06-16 NOTE — Progress Notes (Signed)
Patient bathed and ambulated to chair with only the assistance of the walker. Tolerated well.

## 2021-06-16 NOTE — Progress Notes (Signed)
Came to give patient breathing treatment due to SOB.  No wheezing present, but increased WOB.  Patient sat on 3L was 88%.  Will inform RN that will raise flow to 4L after treatment in hopes to eleviate some of his WOB.

## 2021-06-16 NOTE — Progress Notes (Signed)
Patient was having an episode of increased work of breathing.  Upon assessment, patient was tachypneic and diminished lung sounds.  Patient it alert and oriented and responsive.  Patient oxygen saturation was 88% on 3 liters.  Respiratory called and breathing treatment given and oxygen increased to 4 liters.  Notified MD on call and received order for a chest x- ray.  Patient states he is normally  "short winded" but wearing oxygen since this admission has been new for him.  After breathing treatment, patient stated he felt better and breathing had slowed.

## 2021-06-17 DIAGNOSIS — R5381 Other malaise: Secondary | ICD-10-CM

## 2021-06-17 DIAGNOSIS — A419 Sepsis, unspecified organism: Secondary | ICD-10-CM | POA: Diagnosis not present

## 2021-06-17 DIAGNOSIS — E039 Hypothyroidism, unspecified: Secondary | ICD-10-CM | POA: Diagnosis not present

## 2021-06-17 DIAGNOSIS — N179 Acute kidney failure, unspecified: Secondary | ICD-10-CM | POA: Diagnosis not present

## 2021-06-17 DIAGNOSIS — I10 Essential (primary) hypertension: Secondary | ICD-10-CM | POA: Diagnosis not present

## 2021-06-17 LAB — CBC
HCT: 34 % — ABNORMAL LOW (ref 39.0–52.0)
Hemoglobin: 10.8 g/dL — ABNORMAL LOW (ref 13.0–17.0)
MCH: 32.6 pg (ref 26.0–34.0)
MCHC: 31.8 g/dL (ref 30.0–36.0)
MCV: 102.7 fL — ABNORMAL HIGH (ref 80.0–100.0)
Platelets: 316 10*3/uL (ref 150–400)
RBC: 3.31 MIL/uL — ABNORMAL LOW (ref 4.22–5.81)
RDW: 13.8 % (ref 11.5–15.5)
WBC: 26.8 10*3/uL — ABNORMAL HIGH (ref 4.0–10.5)
nRBC: 0.1 % (ref 0.0–0.2)

## 2021-06-17 LAB — BASIC METABOLIC PANEL
Anion gap: 5 (ref 5–15)
BUN: 39 mg/dL — ABNORMAL HIGH (ref 8–23)
CO2: 24 mmol/L (ref 22–32)
Calcium: 7.2 mg/dL — ABNORMAL LOW (ref 8.9–10.3)
Chloride: 109 mmol/L (ref 98–111)
Creatinine, Ser: 2.19 mg/dL — ABNORMAL HIGH (ref 0.61–1.24)
GFR, Estimated: 30 mL/min — ABNORMAL LOW (ref >60)
Glucose, Bld: 171 mg/dL — ABNORMAL HIGH (ref 70–99)
Potassium: 4.2 mmol/L (ref 3.5–5.1)
Sodium: 138 mmol/L (ref 135–145)

## 2021-06-17 MED ORDER — CEFDINIR 300 MG PO CAPS: 300.0000 mg | ORAL_CAPSULE | Freq: Two times a day (BID) | ORAL | 0 refills | Status: AC

## 2021-06-17 MED ORDER — SODIUM BICARBONATE 650 MG PO TABS: 650.0000 mg | ORAL_TABLET | Freq: Every day | ORAL | Status: AC

## 2021-06-17 MED ORDER — HYDROXYZINE HCL 10 MG PO TABS
10.0000 mg | ORAL_TABLET | Freq: Three times a day (TID) | ORAL | Status: DC | PRN
Start: 2021-06-17 — End: 2021-06-18
  Administered 2021-06-17 – 2021-06-18 (×2): 10 mg via ORAL
  Filled 2021-06-17 (×2): qty 1

## 2021-06-17 MED ORDER — DM-GUAIFENESIN ER 30-600 MG PO TB12: 1.0000 | ORAL_TABLET | Freq: Two times a day (BID) | ORAL | 0 refills | Status: AC

## 2021-06-17 MED ORDER — CAMPHOR-MENTHOL 0.5-0.5 % EX LOTN: TOPICAL_LOTION | CUTANEOUS | Status: AC | PRN

## 2021-06-17 MED ORDER — DIPHENHYDRAMINE-ZINC ACETATE 2-0.1 % EX CREA: TOPICAL_CREAM | Freq: Three times a day (TID) | CUTANEOUS | Status: AC | PRN

## 2021-06-17 MED ORDER — DILTIAZEM HCL ER COATED BEADS 360 MG PO CP24: 360.0000 mg | ORAL_CAPSULE | Freq: Every day | ORAL | 2 refills | Status: AC

## 2021-06-17 NOTE — Progress Notes (Signed)
Pt awaiting transport. EMS called. He will go to room 411 at [facility] Dyann Kief and in transport package with AV[facility]. Report called to [phone] given to [facility] RN

## 2021-06-17 NOTE — Discharge Summary (Addendum)
Physician Discharge Summary   Patient: [name] [name] MRN: [id number] DOB: [birth year]  Admit date:     06/08/2021  Discharge date: 06/18/21  Discharge Physician: Barton Dubois   PCP: [name], MD   Recommendations at discharge:  Repeat basic metabolic panel to follow electrolytes and renal function Repeat CBC to follow hemoglobin/WBCs stability and trend. Continue to follow thyroid panel with further adjustment to Synthroid regimen as required.  Discharge Diagnoses: Principal Problem:   SIRS (systemic inflammatory response syndrome) (HCC) Active Problems:   Essential hypertension   Acute kidney injury superimposed on chronic kidney disease (HCC)   Hypothyroidism   Leukocytosis   Paroxysmal atrial fibrillation with RVR (HCC)   Lactic acidosis   Sepsis [facility])   Physical deconditioning  Hospital Course: As per H&P written by Dr. Josephine Cables on 06/08/2021 [name] [name] is a 81 y.o. male with medical history significant of atrial fibrillation, CKD stage IIIb, prostate cancer with bone metastasis, hypothyroidism, hypertension who presents to the emergency department due to sudden onset of weakness and malaise this afternoon.  Patient states that he was in his normal state of health this morning and was without any complaints, but in the afternoon, around 3-4 PM, he suddenly felt weak and febrile and developed a nonproductive cough, so he went to bed.  Son checked on him and saw that he felt sick, daughter-in-law (who was a Marine scientist) checked his temperature and it was 103.4F, so EMS was activated and patient was sent to the ED for further evaluation and management.  He denies chest pain, headache, vomiting, nausea, abdominal pain, diarrhea or constipation.   ED Course:  In the emergency department, he was tachypneic, tachycardic, BP was 83/59, temperature was 100.87F and O2 sat was 92%.  Work-up in the ED showed normal CBC except leukocytosis.  BUN/creatinine 36/2.54 (baseline creatinine at  2.0) and hyperglycemia.  Lactic acid 3.3 > 2.1, urinalysis was positive for large leukocytes and many bacteria.  Influenza A, B, SARS coronavirus 2 was negative. Chest x-ray shows no active disease Tylenol was given, patient was started on IV ceftriaxone and azithromycin due to presumed CAP, IV Cardizem drip was started due to patient being in paroxysmal A-fib and IV hydration was provided per sepsis protocol.  Hospitalist was asked to admit patient for further evaluation and management.   Assessment and Plan: Severe sepsis secondary to Enterobacter UTI with possible CAP, present on admission -Blood cultures with no growth thus far and urine cultures ordered after initial antibiotics -Patient was initially on Rocephin and azithromycin which was changed to Zosyn 6/2, now on cefepime per sensitivities on urine culture. To complete therapy he has been discharge on oral cefdinir. -Repeat blood culture 6/2 with no growth to date -Continue Tylenol as needed -Continue Mucinex, incentive spirometry and flutter valve  -Follow clinical response. -Good oxygen saturation on room air at rest; but fluctuating into 3-4 L with exertion. Will go to SNF with oxygen supplementation to continue weaning off as tolerated.    Acute hypoxemic respiratory failure secondary to cardiogenic pulmonary edema and CAP. -Improved/resolved after providing IV Lasix and stabilizing his uncontrolled arrhythmia. -Patient's oxygen supplementation fluctuating and stable 3-4L; continue to wean off as tolerated. -2D echocardiogram 6/4 with LVEF 60-65% and no other acute findings   Leukocytosis possible secondary to above -Zosyn discontinued and changed to cefepime 6/5,  -At time of discharge antibiotics transition to the use of cefdinir to complete therapy. -Repeat CBC in 1 week to follow hemoglobin/WBCs trend and stability.  Lactic acidosis possibly secondary to multifactorial -Resolved -Continue to maintain adequate  hydration.  Paroxysmal A-fib with RVR-improved -Continue Lopressor and oral Cardizem -Continue Xarelto for secondary prevention -Irregular rhythm with controlled rate at time of discharge. -TSH within normal limits and 2D echo with preserved ejection fraction and no wall motion abnormalities. -Outpatient follow-up with cardiology service recommended.  Acute kidney injury on CKD stage IV BUN/creatinine 36/2.54 on admission (baseline creatinine at 2.0) -Currently has remained in the 2.19 range, continue to follow daily weights and strict intake and output.  -follow renal function trend, maintain adequate hydration and minimize the use of nephrotoxic agents.    Hypothyroidism -Continue Synthroid. -TSH within normal limits.  Essential hypertension -Continue metoprolol and oral Cardizem; last 1 will be consolidated on extended release form. -Follow-up vital signs and response..   Prostate cancer with metastasis -Currently on treatment with urology  -Continue outpatient follow-up for this.   Physical deconditioning -Patient has been seen by PT with recommendation for a skilled nursing facility at discharge for further care and rehab. -Patient and family in agreement. -Will transfer to skilled nursing facility later today 06/17/2021.  Medication/linen rash -continue PRN topical benadryl/sarna lotion -5 days of oral prednisone ordered. -avoid scratching.   Consultants: None Procedures performed: See below for x-ray reports. Disposition: Skilled nursing facility Diet recommendation: Cardiac diet  DISCHARGE MEDICATION: Allergies as of 06/17/2021       Reactions   No Known Allergies         Medication List     STOP taking these medications    valsartan 40 MG tablet Commonly known as: DIOVAN       TAKE these medications    Calcium 600+D3 600-20 MG-MCG Tabs Generic drug: Calcium Carb-Cholecalciferol Take 1 tablet by mouth 2 (two) times daily.   camphor-menthol  lotion Commonly known as: SARNA Apply topically as needed for itching.   cefdinir 300 MG capsule Commonly known as: OMNICEF Take 1 capsule (300 mg total) by mouth 2 (two) times daily for 5 days.   Cholecalciferol 25 MCG (1000 UT) tablet Take 1,000 Units by mouth daily.   dextromethorphan-guaiFENesin 30-600 MG 12hr tablet Commonly known as: MUCINEX DM Take 1 tablet by mouth 2 (two) times daily.   diltiazem 360 MG 24 hr capsule Commonly known as: CARDIZEM CD Take 1 capsule (360 mg total) by mouth daily. Start taking on: June 18, 2021   diphenhydrAMINE-zinc acetate cream Commonly known as: BENADRYL Apply topically 3 (three) times daily as needed for itching.   ferrous sulfate 325 (65 FE) MG tablet Take 325 mg by mouth daily with breakfast.   levothyroxine 100 MCG tablet Commonly known as: SYNTHROID Take 1 tablet (100 mcg total) by mouth daily before breakfast.   metoprolol tartrate 100 MG tablet Commonly known as: LOPRESSOR Take 1 tablet by mouth twice daily   sodium bicarbonate 650 MG tablet Take 1 tablet (650 mg total) by mouth daily. What changed: when to take this   Xarelto 15 MG Tabs tablet Generic drug: Rivaroxaban TAKE 1 TABLET BY MOUTH ONCE DAILY WITH SUPPER        Follow-up Information     [name], MD. Schedule an appointment as soon as possible for a visit in 10 day(s).   Specialty: Family Medicine Why: After discharge from the skilled nursing facility. Contact information: [place] [address] [phone]         CroitoruDani Gobble, MD .   Specialty: Cardiology Contact information: [address]  [place] [address] [phone]                Discharge Exam: Filed Weights   06/12/21 0425 06/13/21 0500 06/14/21 0606  Weight: 126.9 kg 128.6 kg 124.4 kg   General exam: Alert, awake, oriented x 3; no chest pain, no nausea, no vomiting.  Weak and tired/deconditioned but feeling ready to pursue  rehabilitation. Respiratory system: Improved air movement bilaterally; no using accessory muscles and good saturation on current supplementation.  Positive scattered rhonchi are appreciated. Cardiovascular system: Rate controlled, irregular rhythm; no rubs or gallops appreciated. Gastrointestinal system: Abdomen is nondistended, soft and nontender. No organomegaly or masses felt. Normal bowel sounds heard. Central nervous system: Alert and oriented. No focal neurological deficits. Extremities: No cyanosis or clubbing. Skin: No petechiae. Diffuse rash in his torso appreciated; patient reports associated itching.  Psychiatry: Judgement and insight appear normal. Mood & affect appropriate.    Condition at discharge: Stable and improved.  The results of significant diagnostics from this hospitalization (including imaging, microbiology, ancillary and laboratory) are listed below for reference.   Imaging Studies: DG Chest Port 1 View  Result Date: 06/16/2021 CLINICAL DATA:  Increased work of breathing.  Fevers. EXAM: PORTABLE CHEST 1 VIEW COMPARISON:  06/14/2021 FINDINGS: Stable cardiomediastinal contours. Aortic atherosclerosis. Chronic asymmetric elevation of the right hemidiaphragm noted. Atelectasis noted in the right base. Mild increase interstitial markings appear similar to previous exam. No pleural effusion or pneumothorax. IMPRESSION: 1. Stable chronic asymmetric elevation of the right hemidiaphragm with right base atelectasis. 2. Stable increased interstitial markings which may reflect pulmonary edema or atypical infection. Electronically Signed   By: Kerby Moors M.D.   On: 06/16/2021 06:17   DG CHEST PORT 1 VIEW  Result Date: 06/14/2021 CLINICAL DATA:  Shortness of breath EXAM: PORTABLE CHEST 1 VIEW COMPARISON:  06/10/2021 FINDINGS: Stable cardiomediastinal contours. Aortic atherosclerosis. Chronic elevation of the right hemidiaphragm with right basilar atelectasis. Slightly increasing  interstitial markings bilaterally. No pleural effusion or pneumothorax. IMPRESSION: Slightly increasing interstitial markings bilaterally, could reflect edema or atypical/viral infection. Electronically Signed   By: Davina Poke D.O.   On: 06/14/2021 11:02   ECHOCARDIOGRAM COMPLETE  Result Date: 06/12/2021    ECHOCARDIOGRAM REPORT   Patient Name:   [name] [name] [facility] Date of Exam: 06/12/2021 Medical Rec #:  [id number]         Height:       74.0 in Accession #:    [id number]        Weight:       279.8 lb Date of Birth:  1940-07-25         BSA:          2.507 m Patient Age:    33 years          BP:           108/63 mmHg Patient Gender: M                 HR:           99 bpm. Exam Location:  [facility] Procedure: 2D Echo, Color Doppler and Cardiac Doppler Indications:    I48.91* Unspecified atrial fibrillation  History:        Patient has prior history of Echocardiogram examinations, most                 recent 08/30/2015. CHF, Arrythmias:Atrial Fibrillation; Risk                 Factors:Hypertension.  Sonographer:    Raquel Sarna Senior RDCS Referring Phys: [phone] Royanne Foots [facility]  Sonographer Comments: Poor apical window due to lung interference IMPRESSIONS  1. Left ventricular ejection fraction, by estimation, is 60 to 65%. The left ventricle has normal function. The left ventricle has no regional wall motion abnormalities. Left ventricular diastolic function could not be evaluated.  2. Right ventricular systolic function is normal. The right ventricular size is normal. There is normal pulmonary artery systolic pressure. The estimated right ventricular systolic pressure is 45.8 mmHg.  3. Left atrial size was mildly dilated.  4. Right atrial size was mildly dilated.  5. The mitral valve is myxomatous. No evidence of mitral valve regurgitation. No evidence of mitral stenosis.  6. The aortic valve is tricuspid. There is mild calcification of the aortic valve. There is mild thickening of the aortic valve. Aortic valve  regurgitation is not visualized. Aortic valve sclerosis/calcification is present, without any evidence of aortic stenosis.  7. There is mild dilatation of the ascending aorta, measuring 40 mm.  8. The inferior vena cava is normal in size with greater than 50% respiratory variability, suggesting right atrial pressure of 3 mmHg. Comparison(s): Prior images unable to be directly viewed, comparison made by report only. FINDINGS  Left Ventricle: Left ventricular ejection fraction, by estimation, is 60 to 65%. The left ventricle has normal function. The left ventricle has no regional wall motion abnormalities. The left ventricular internal cavity size was normal in size. There is  no left ventricular hypertrophy. Left ventricular diastolic function could not be evaluated due to atrial fibrillation. Left ventricular diastolic function could not be evaluated. Right Ventricle: The right ventricular size is normal. No increase in right ventricular wall thickness. Right ventricular systolic function is normal. There is normal pulmonary artery systolic pressure. The tricuspid regurgitant velocity is 2.51 m/s, and  with an assumed right atrial pressure of 3 mmHg, the estimated right ventricular systolic pressure is 09.9 mmHg. Left Atrium: Left atrial size was mildly dilated. Right Atrium: Right atrial size was mildly dilated. Pericardium: There is no evidence of pericardial effusion. 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 There is moderate thickening of the mitral valve leaflet(s). Mild mitral annular calcification. No evidence of mitral valve regurgitation. No evidence of mitral valve stenosis. Tricuspid Valve: The tricuspid valve is normal in structure. Tricuspid valve regurgitation is mild. Aortic Valve: The aortic valve is tricuspid. There is mild calcification of the aortic valve. There is mild thickening of the aortic valve. Aortic valve regurgitation is not visualized. Aortic valve sclerosis/calcification is  present, without any evidence of aortic stenosis. Pulmonic Valve: The pulmonic valve was normal in structure. Pulmonic valve regurgitation is not visualized. Aorta: The aortic root is normal in size and structure. There is mild dilatation of the ascending aorta, measuring 40 mm. Venous: The inferior vena cava is normal in size with greater than 50% respiratory variability, suggesting right atrial pressure of 3 mmHg. IAS/Shunts: No atrial level shunt detected by color flow Doppler.  LEFT VENTRICLE PLAX 2D LVIDd:         3.80 cm LVIDs:         2.50 cm LV PW:         1.00 cm LV IVS:        1.10 cm LVOT diam:     2.40 cm LV SV:         52 LV SV Index:   21 LVOT Area:     4.52 cm  RIGHT VENTRICLE RV  S prime:     12.00 cm/s TAPSE (M-mode): 1.6 cm LEFT ATRIUM             Index        RIGHT ATRIUM           Index LA diam:        3.80 cm 1.52 cm/m   RA Area:     18.30 cm LA Vol (A2C):   79.9 ml 31.87 ml/m  RA Volume:   41.90 ml  16.72 ml/m LA Vol (A4C):   64.2 ml 25.61 ml/m LA Biplane Vol: 72.6 ml 28.96 ml/m  AORTIC VALVE LVOT Vmax:   72.70 cm/s LVOT Vmean:  49.600 cm/s LVOT VTI:    0.115 m  AORTA Ao Root diam: 3.60 cm Ao Asc diam:  4.00 cm TRICUSPID VALVE TR Peak grad:   25.2 mmHg TR Vmax:        251.00 cm/s  SHUNTS Systemic VTI:  0.12 m Systemic Diam: 2.40 cm Dani Gobble Croitoru MD Electronically signed by [name] MD Signature Date/Time: 06/12/2021/3:39:49 PM    Final    DG CHEST PORT 1 VIEW  Result Date: 06/10/2021 CLINICAL DATA:  Wheezing. EXAM: PORTABLE CHEST 1 VIEW COMPARISON:  Chest x-ray 02/03/2009. FINDINGS: There is stable mild elevation of the right hemidiaphragm. The heart is enlarged. There is central pulmonary vascular congestion. There is linear atelectasis in the right lower lung. There is no pleural effusion or pneumothorax. No acute fractures are seen. IMPRESSION: 1. Cardiomegaly with central pulmonary vascular congestion. Electronically Signed   By: Ronney Asters M.D.   On: 06/10/2021 17:47    DG Chest Port 1 View  Result Date: 06/08/2021 CLINICAL DATA:  Questionable sepsis - evaluate for abnormality EXAM: PORTABLE CHEST 1 VIEW.  Patient is slightly rotated. COMPARISON:  Chest x-ray 09/24/2015 FINDINGS: The heart and mediastinal contours are unchanged. Aortic calcification. Elevated right hemidiaphragm. Right base atelectasis. No focal consolidation. No pulmonary edema. No pleural effusion. No pneumothorax. No acute osseous abnormality. IMPRESSION: 1. No active disease. 2.  Aortic Atherosclerosis (ICD10-I70.0). Electronically Signed   By: Iven Finn M.D.   On: 06/08/2021 20:08    Microbiology: Results for orders placed or performed during the hospital encounter of 06/08/21  Blood Culture (routine x 2)     Status: None   Collection Time: 06/08/21  7:36 PM   Specimen: BLOOD  Result Value Ref Range Status   Specimen Description BLOOD RIGHT ANTECUBITAL  Final   Special Requests   Final    BOTTLES DRAWN AEROBIC AND ANAEROBIC Blood Culture adequate volume   Culture   Final    NO GROWTH 5 DAYS Performed at [facility], [address] [place], [place] [address]    Report Status 06/13/2021 FINAL  Final  Blood Culture (routine x 2)     Status: None   Collection Time: 06/08/21  7:48 PM   Specimen: BLOOD  Result Value Ref Range Status   Specimen Description BLOOD BLOOD LEFT HAND  Final   Special Requests   Final    BOTTLES DRAWN AEROBIC AND ANAEROBIC Blood Culture results may not be optimal due to an inadequate volume of blood received in culture bottles   Culture   Final    NO GROWTH 5 DAYS Performed at [facility], [address] [place], [place] [address]    Report Status 06/13/2021 FINAL  Final  Resp Panel by RT-PCR (Flu A&B, Covid) Anterior Nasal Swab     Status: None   Collection  Time: 06/08/21  8:10 PM   Specimen: Anterior Nasal Swab  Result Value Ref Range Status   SARS Coronavirus 2 by RT PCR NEGATIVE NEGATIVE Final    Comment: (NOTE) SARS-CoV-2 target nucleic  acids are NOT DETECTED.  The SARS-CoV-2 RNA is generally detectable in upper respiratory specimens during the acute phase of infection. The lowest concentration of SARS-CoV-2 viral copies this assay can detect is 138 copies/mL. A negative result does not preclude SARS-Cov-2 infection and should not be used as the sole basis for treatment or other patient management decisions. A negative result may occur with  improper specimen collection/handling, submission of specimen other than nasopharyngeal swab, presence of viral mutation(s) within the areas targeted by this assay, and inadequate number of viral copies(<138 copies/mL). A negative result must be combined with clinical observations, patient history, and epidemiological information. The expected result is Negative.  Fact Sheet for Patients:  EntrepreneurPulse.com.au  Fact Sheet for Healthcare Providers:  IncredibleEmployment.be  This test is no t yet approved or cleared by the [place] FDA and  has been authorized for detection and/or diagnosis of SARS-CoV-2 by FDA under an Emergency Use Authorization (EUA). This EUA will remain  in effect (meaning this test can be used) for the duration of the COVID-19 declaration under Section 564(b)(1) of the Act, 21 U.S.C.section 360bbb-3(b)(1), unless the authorization is terminated  or revoked sooner.       Influenza A by PCR NEGATIVE NEGATIVE Final   Influenza B by PCR NEGATIVE NEGATIVE Final    Comment: (NOTE) The Xpert Xpress SARS-CoV-2/FLU/RSV plus assay is intended as an aid in the diagnosis of influenza from Nasopharyngeal swab specimens and should not be used as a sole basis for treatment. Nasal washings and aspirates are unacceptable for Xpert Xpress SARS-CoV-2/FLU/RSV testing.  Fact Sheet for Patients: EntrepreneurPulse.com.au  Fact Sheet for Healthcare Providers: IncredibleEmployment.be  This  test is not yet approved or cleared by the [place] FDA and has been authorized for detection and/or diagnosis of SARS-CoV-2 by FDA under an Emergency Use Authorization (EUA). This EUA will remain in effect (meaning this test can be used) for the duration of the COVID-19 declaration under Section 564(b)(1) of the Act, 21 U.S.C. section 360bbb-3(b)(1), unless the authorization is terminated or revoked.  Performed at [facility], [address] [place], [place] [address]   MRSA Next Gen by PCR, Nasal     Status: None   Collection Time: 06/09/21  9:14 AM   Specimen: Nasal Mucosa; Nasal Swab  Result Value Ref Range Status   MRSA by PCR Next Gen NOT DETECTED NOT DETECTED Final    Comment: (NOTE) The GeneXpert MRSA Assay (FDA approved for NASAL specimens only), is one component of a comprehensive MRSA colonization surveillance program. It is not intended to diagnose MRSA infection nor to guide or monitor treatment for MRSA infections. Test performance is not FDA approved in patients less than 68 years old. Performed at [facility], [address], [place], [place] [address]   Urine Culture     Status: Abnormal   Collection Time: 06/09/21 12:33 PM   Specimen: Urine, Clean Catch  Result Value Ref Range Status   Specimen Description   Final    URINE, CLEAN CATCH Performed at [facility], [address], [place], [place] [address]    Special Requests   Final    NONE Performed at [facility], [address], [place], [place] [address]    Culture 40,000 COLONIES/mL ENTEROBACTER CLOACAE (A)  Final  Report Status 06/13/2021 FINAL  Final   Organism ID, Bacteria ENTEROBACTER CLOACAE (A)  Final      Susceptibility   Enterobacter cloacae - MIC*    CEFAZOLIN >=64 RESISTANT Resistant     CEFEPIME 2 SENSITIVE Sensitive     CIPROFLOXACIN 1 RESISTANT Resistant     GENTAMICIN <=1 SENSITIVE Sensitive     IMIPENEM 0.5 SENSITIVE Sensitive     NITROFURANTOIN 64 INTERMEDIATE Intermediate      TRIMETH/SULFA <=20 SENSITIVE Sensitive     PIP/TAZO >=128 RESISTANT Resistant     * 40,000 COLONIES/mL ENTEROBACTER CLOACAE  Culture, blood (Routine X 2) w Reflex to ID Panel     Status: None   Collection Time: 06/10/21  9:38 AM   Specimen: BLOOD LEFT HAND  Result Value Ref Range Status   Specimen Description   Final    BLOOD LEFT HAND BOTTLES DRAWN AEROBIC AND ANAEROBIC   Special Requests Blood Culture adequate volume  Final   Culture   Final    NO GROWTH 5 DAYS Performed at [facility], [address] [place], [place] [address]    Report Status 06/15/2021 FINAL  Final  Culture, blood (Routine X 2) w Reflex to ID Panel     Status: None   Collection Time: 06/10/21  2:48 PM   Specimen: BLOOD RIGHT HAND  Result Value Ref Range Status   Specimen Description BLOOD RIGHT HAND  Final   Special Requests   Final    BOTTLES DRAWN AEROBIC AND ANAEROBIC Blood Culture adequate volume   Culture   Final    NO GROWTH 5 DAYS Performed at [facility], [address], [place], [place] [address]    Report Status 06/15/2021 FINAL  Final    Labs: CBC: Recent Labs  Lab 06/12/21 0401 06/13/21 0530 06/14/21 0325 06/15/21 0452 06/17/21 0439  WBC 12.8* 14.4* 14.7* 17.8* 26.8*  HGB 12.0* 11.4* 11.1* 11.2* 10.8*  HCT 37.3* 34.9* 34.6* 35.8* 34.0*  MCV 101.6* 99.7 101.8* 102.9* 102.7*  PLT 240 228 229 280 660   Basic Metabolic Panel: Recent Labs  Lab 06/11/21 0424 06/12/21 0401 06/13/21 0530 06/14/21 0325 06/15/21 0452 06/17/21 0439  NA 137 138 138 140 140 138  K 4.2 4.0 3.9 4.1 3.9 4.2  CL 104 103 106 105 106 109  CO2 '25 27 25 25 26 24  '$ GLUCOSE 165* 153* 178* 167* 182* 171*  BUN 38* 42* 43* 44* 47* 39*  CREATININE 2.36* 2.53* 2.32* 2.20* 2.20* 2.19*  CALCIUM 7.9* 7.9* 7.8* 7.6* 7.7* 7.2*  MG 1.9 2.0 2.1 2.1 2.3  --    Liver Function Tests: Recent Labs  Lab 06/11/21 0424  AST 37  ALT 30  ALKPHOS 50  BILITOT 1.0  PROT 6.9  ALBUMIN 2.8*    Discharge time spent: greater  than 30 minutes.  Signed: Barton Dubois, MD [facility] 06/17/2021

## 2021-06-17 NOTE — TOC Transition Note (Signed)
Transition of Care [facility]) - CM/SW Discharge Note   Patient Details  Name: [name] [name] MRN: [id number] Date of Birth: [birth year]  [facility] of [facility] CM/SW Contact:  Ihor Gully, LCSW Phone Number: 06/17/2021, 2:12 PM   Clinical Narrative:    Discharge clinicals sent to facility. EMS called for transport. RN to call report. Message left for son, Francee Piccolo. TOC signing off.    Final next level of care: [place] Barriers to Discharge: No Barriers Identified   Patient Goals and CMS Choice        Discharge Placement              Patient chooses bed at: [facility] Patient to be transferred to facility by: [place] Name of family member notified: message left for roger Patient and family notified of of transfer: 06/17/21  Discharge Plan and Services                                     Social Determinants of Health (SDOH) Interventions     Readmission Risk Interventions     No data to display

## 2021-06-17 NOTE — Progress Notes (Signed)
Physical Therapy Treatment Patient Details Name: [name] [name] MRN: [id number] DOB: May 17, 1940 Today's Date: 06/17/2021   History of Present Illness [name] [name] is a 81 y.o. male with medical history significant of atrial fibrillation, CKD stage IIIb, prostate cancer with bone metastasis, hypothyroidism, hypertension who presents to the emergency department due to sudden onset of weakness and malaise this afternoon.  Patient states that he was in his normal state of health this morning and was without any complaints, but in the afternoon, around 3-4 PM, he suddenly felt weak and febrile and developed a nonproductive cough, so he went to bed.  Son checked on him and saw that he felt sick, daughter-in-law (who was a Marine scientist) checked his temperature and it was 103.59F, so EMS was activated and patient was sent to the ED for further evaluation and management.  He denies chest pain, headache, vomiting, nausea, abdominal pain, diarrhea or constipation.    PT Comments    Monitored O2 sat throughout session on 4L with 96% seated EOB at rest, 92% with seated exercises, and 88% with ambulation. Patient able to transition to seated EOB without assist. He demonstrates good sitting balance and sitting tolerance while completing seated exercises. Patient ambulates in room with use of RW but is limited by fatigue and ends session seated in chair. Patient with frequent SOB throughout session with all mobility requiring cueing for pursed lip breathing and decreasing respiratory rate. Patient will benefit from continued skilled physical therapy in hospital and recommended venue below to increase strength, balance, endurance for safe ADLs and gait.   Recommendations for follow up therapy are one component of a multi-disciplinary discharge planning process, led by the attending physician.  Recommendations may be updated based on patient status, additional functional criteria and insurance authorization.  Follow Up  Recommendations  Skilled nursing-short term rehab (<3 hours/day)     Assistance Recommended at Discharge Set up Supervision/Assistance  Patient can return home with the following A lot of help with walking and/or transfers;A little help with bathing/dressing/bathroom;Assistance with cooking/housework;Help with stairs or ramp for entrance;Assist for transportation   Equipment Recommendations  None recommended by PT    Recommendations for Other Services       Precautions / Restrictions Precautions Precautions: Fall Restrictions Weight Bearing Restrictions: No     Mobility  Bed Mobility Overal bed mobility: Modified Independent Bed Mobility: Supine to Sit     Supine to sit: Modified independent (Device/Increase time), HOB elevated          Transfers Overall transfer level: Needs assistance Equipment used: Rolling walker (2 wheels) Transfers: Sit to/from Stand Sit to Stand: Min guard, Min assist   Step pivot transfers: Min guard       General transfer comment: Patient demonstrates slow labored movement with transfers and use of RW    Ambulation/Gait Ambulation/Gait assistance: Min guard, Min assist Gait Distance (Feet): 15 Feet Assistive device: Rolling walker (2 wheels) Gait Pattern/deviations: Decreased step length - right, Decreased step length - left, Decreased stride length, Trunk flexed Gait velocity: decreased     General Gait Details: Slow labored cadence with use of RW; On 4 L/M O2 throughout session; limited by fatigue   Stairs             Wheelchair Mobility    Modified Rankin (Stroke Patients Only)       Balance Overall balance assessment: Needs assistance Sitting-balance support: Feet supported, No upper extremity supported Sitting balance-Leahy Scale: Fair Sitting balance - Comments: fair/good seated  at EOB   Standing balance support: During functional activity, Reliant on assistive device for balance, Bilateral upper extremity  supported Standing balance-Leahy Scale: Fair                              Cognition Arousal/Alertness: Awake/alert Behavior During Therapy: WFL for tasks assessed/performed Overall Cognitive Status: Within Functional Limits for tasks assessed                                          Exercises General Exercises - Lower Extremity Long Arc Quad: AROM, Strengthening, Both, 10 reps, Seated Hip Flexion/Marching: AROM, 10 reps, Both, Strengthening, Seated Toe Raises: AROM, Strengthening, Both, Seated, 10 reps Heel Raises: AROM, Both, Seated, Strengthening, 10 reps    General Comments        Pertinent Vitals/Pain Pain Assessment Pain Assessment: No/denies pain    Home Living                          Prior Function            PT Goals (current goals can now be found in the care plan section) Acute Rehab PT Goals Patient Stated Goal: return home with family to assist PT Goal Formulation: With patient Time For Goal Achievement: 06/28/21 Potential to Achieve Goals: Good Progress towards PT goals: Progressing toward goals    Frequency    Min 3X/week      PT Plan Current plan remains appropriate    Co-evaluation              AM-PAC PT "6 Clicks" Mobility   Outcome Measure  Help needed turning from your back to your side while in a flat bed without using bedrails?: None Help needed moving from lying on your back to sitting on the side of a flat bed without using bedrails?: A Little Help needed moving to and from a bed to a chair (including a wheelchair)?: A Little Help needed standing up from a chair using your arms (e.g., wheelchair or bedside chair)?: A Lot Help needed to walk in hospital room?: A Lot Help needed climbing 3-5 steps with a railing? : A Lot 6 Click Score: 16    End of Session Equipment Utilized During Treatment: Oxygen;Gait belt Activity Tolerance: Patient limited by fatigue Patient left: in chair;with  call bell/phone within reach Nurse Communication: Mobility status PT Visit Diagnosis: Unsteadiness on feet (R26.81);Other abnormalities of gait and mobility (R26.89);Muscle weakness (generalized) (M62.81)     Time: 3662-9476 PT Time Calculation (min) (ACUTE ONLY): 25 min  Charges:  $Therapeutic Exercise: 8-22 mins $Therapeutic Activity: 8-22 mins                    9:31 AM, 06/17/21 Mearl Latin PT, DPT Physical Therapist at [facility]

## 2021-06-17 NOTE — Progress Notes (Signed)
Manual bp taken per dr Dyann Kief 102/59 rt arm 92/58 left wrist called into flo pt stable to call ems back for trans per dr Dyann Kief

## 2021-06-17 NOTE — Care Management Important Message (Signed)
Important Message  Patient Details  Name: [name] [name] MRN: [id number] Date of Birth: May 26, 1940   Medicare Important Message Given:  Other (see comment)  Attempted to reach patient to review Medicare IM via room phone [phone]).  Unable to reach at this time.   Dannette Barbara 06/17/2021, 1:44 PM

## 2021-06-18 DIAGNOSIS — C7951 Secondary malignant neoplasm of bone: Secondary | ICD-10-CM | POA: Diagnosis not present

## 2021-06-18 DIAGNOSIS — C61 Malignant neoplasm of prostate: Secondary | ICD-10-CM | POA: Diagnosis not present

## 2021-06-18 DIAGNOSIS — D6832 Hemorrhagic disorder due to extrinsic circulating anticoagulants: Secondary | ICD-10-CM | POA: Diagnosis not present

## 2021-06-18 DIAGNOSIS — I10 Essential (primary) hypertension: Secondary | ICD-10-CM | POA: Diagnosis not present

## 2021-06-18 DIAGNOSIS — J9611 Chronic respiratory failure with hypoxia: Secondary | ICD-10-CM | POA: Diagnosis not present

## 2021-06-18 DIAGNOSIS — E039 Hypothyroidism, unspecified: Secondary | ICD-10-CM | POA: Diagnosis not present

## 2021-06-18 DIAGNOSIS — I480 Paroxysmal atrial fibrillation: Secondary | ICD-10-CM | POA: Diagnosis not present

## 2021-06-18 DIAGNOSIS — N183 Chronic kidney disease, stage 3 unspecified: Secondary | ICD-10-CM | POA: Diagnosis not present

## 2021-06-18 DIAGNOSIS — Z7901 Long term (current) use of anticoagulants: Secondary | ICD-10-CM | POA: Diagnosis not present

## 2021-06-18 DIAGNOSIS — N390 Urinary tract infection, site not specified: Secondary | ICD-10-CM | POA: Diagnosis not present

## 2021-06-18 DIAGNOSIS — E559 Vitamin D deficiency, unspecified: Secondary | ICD-10-CM | POA: Diagnosis not present

## 2021-06-18 MED ORDER — PREDNISONE 20 MG PO TABS: 20.0000 mg | ORAL_TABLET | Freq: Every day | ORAL | Status: DC

## 2021-06-18 MED ORDER — PREDNISONE 20 MG PO TABS: 20.0000 mg | ORAL_TABLET | Freq: Every day | ORAL | Status: AC

## 2021-06-18 NOTE — Progress Notes (Signed)
Dynamically stable, in no acute distress, denies chest pain, no nausea, no vomiting.  Was unable to be transported to skilled nursing facility as planned yesterday due to having some erroneous low blood pressure at the time of transportation time; then it was unable for transportation services to happen again later throughout the day. Please refer to discharge summary dictated and updated for [facility] 6/9--06/18/21 for further info/details as part of instructions and recommendations.  Patient is medically and clinically ready for discharge at this point.    Barton Dubois MD [phone]

## 2021-06-18 NOTE — Progress Notes (Addendum)
   06/17/21 2228  Vitals  Temp 97.9 F (36.6 C)  Temp Source Axillary  BP 128/72  BP Location Right Arm  BP Method Manual  Patient Position (if appropriate) Lying  Pulse Rate 90  Pulse Rate Source Dinamap  Resp 20  MEWS COLOR  MEWS Score Color Green  Oxygen Therapy  SpO2 99 %  O2 Device Nasal Cannula  O2 Flow Rate (L/min) 4.5 L/min  MEWS Score  MEWS Temp 0  MEWS Systolic 0  MEWS Pulse 0  MEWS RR 0  MEWS LOC 0  MEWS Score 0   Patient was very lethargic, and hard to get woken up. After this nurse, two CNA's and Charge Nurse Steffanie Dunn attempted to arouse patient, informed  Dr. Josephine Cables of patients status. This nurse remained with patient while assessing patient patient woke up and took his medications. Informed by Dr. Josephine Cables to hold Trazadone '50mg'$ . Will continue to monitor patient.

## 2021-06-18 NOTE — Progress Notes (Signed)
Patient is more responsive, Vitals taken Patient cleaned up after having loose stool. He took his medication without any issues. Patient continues to have rash all over his body no itching observed by this nurse or the CNA [place].

## 2021-06-24 LAB — PROCALCITONIN

## 2021-06-28 ENCOUNTER — Ambulatory Visit: Payer: PPO | Admitting: Nurse Practitioner

## 2021-07-05 ENCOUNTER — Ambulatory Visit: Payer: PPO | Admitting: Nurse Practitioner

## 2021-07-09 DEATH — deceased
